# Patient Record
Sex: Male | Born: 1965 | Race: White | Hispanic: No | Marital: Married | Smoking: Former smoker
Health system: Southern US, Community
[De-identification: ages and names within clinical notes are randomized; demographics above are authoritative.]

## PROBLEM LIST (undated history)

## (undated) DIAGNOSIS — K559 Vascular disorder of intestine, unspecified: Secondary | ICD-10-CM

## (undated) DIAGNOSIS — J189 Pneumonia, unspecified organism: Secondary | ICD-10-CM

## (undated) DIAGNOSIS — J45909 Unspecified asthma, uncomplicated: Secondary | ICD-10-CM

## (undated) DIAGNOSIS — T8859XA Other complications of anesthesia, initial encounter: Secondary | ICD-10-CM

## (undated) DIAGNOSIS — R6521 Severe sepsis with septic shock: Secondary | ICD-10-CM

## (undated) DIAGNOSIS — A419 Sepsis, unspecified organism: Secondary | ICD-10-CM

## (undated) DIAGNOSIS — T4145XA Adverse effect of unspecified anesthetic, initial encounter: Secondary | ICD-10-CM

## (undated) DIAGNOSIS — Z9289 Personal history of other medical treatment: Secondary | ICD-10-CM

## (undated) DIAGNOSIS — R519 Headache, unspecified: Secondary | ICD-10-CM

## (undated) DIAGNOSIS — R51 Headache: Secondary | ICD-10-CM

## (undated) DIAGNOSIS — M21372 Foot drop, left foot: Secondary | ICD-10-CM

## (undated) DIAGNOSIS — F419 Anxiety disorder, unspecified: Secondary | ICD-10-CM

## (undated) DIAGNOSIS — K631 Perforation of intestine (nontraumatic): Secondary | ICD-10-CM

## (undated) HISTORY — PX: BACK SURGERY: SHX140

---

## 2002-10-24 ENCOUNTER — Encounter: Payer: Self-pay | Admitting: Emergency Medicine

## 2002-10-24 ENCOUNTER — Emergency Department (HOSPITAL_COMMUNITY): Admission: EM | Admit: 2002-10-24 | Discharge: 2002-10-24 | Payer: Self-pay | Admitting: Emergency Medicine

## 2002-10-29 ENCOUNTER — Emergency Department (HOSPITAL_COMMUNITY): Admission: EM | Admit: 2002-10-29 | Discharge: 2002-10-29 | Payer: Self-pay | Admitting: Plastic Surgery

## 2004-08-16 ENCOUNTER — Encounter: Admission: RE | Admit: 2004-08-16 | Discharge: 2004-08-16 | Payer: Self-pay | Admitting: Occupational Medicine

## 2006-10-11 ENCOUNTER — Emergency Department (HOSPITAL_COMMUNITY): Admission: EM | Admit: 2006-10-11 | Discharge: 2006-10-11 | Payer: Self-pay | Admitting: Emergency Medicine

## 2006-12-04 ENCOUNTER — Inpatient Hospital Stay (HOSPITAL_COMMUNITY): Admission: EM | Admit: 2006-12-04 | Discharge: 2006-12-06 | Payer: Self-pay | Admitting: Emergency Medicine

## 2007-03-24 ENCOUNTER — Ambulatory Visit (HOSPITAL_COMMUNITY): Admission: RE | Admit: 2007-03-24 | Discharge: 2007-03-24 | Payer: Self-pay | Admitting: Neurological Surgery

## 2007-07-31 ENCOUNTER — Inpatient Hospital Stay (HOSPITAL_COMMUNITY): Admission: RE | Admit: 2007-07-31 | Discharge: 2007-08-05 | Payer: Self-pay | Admitting: Neurological Surgery

## 2007-08-05 ENCOUNTER — Ambulatory Visit: Payer: Self-pay | Admitting: Thoracic Surgery

## 2007-08-12 ENCOUNTER — Ambulatory Visit: Payer: Self-pay | Admitting: Thoracic Surgery

## 2007-08-12 ENCOUNTER — Encounter: Admission: RE | Admit: 2007-08-12 | Discharge: 2007-08-12 | Payer: Self-pay | Admitting: Thoracic Surgery

## 2007-09-09 ENCOUNTER — Ambulatory Visit: Payer: Self-pay | Admitting: Thoracic Surgery

## 2007-09-09 ENCOUNTER — Encounter: Admission: RE | Admit: 2007-09-09 | Discharge: 2007-09-09 | Payer: Self-pay | Admitting: Thoracic Surgery

## 2008-02-25 ENCOUNTER — Ambulatory Visit (HOSPITAL_COMMUNITY): Admission: RE | Admit: 2008-02-25 | Discharge: 2008-02-25 | Payer: Self-pay | Admitting: Neurological Surgery

## 2008-04-27 ENCOUNTER — Inpatient Hospital Stay (HOSPITAL_COMMUNITY): Admission: RE | Admit: 2008-04-27 | Discharge: 2008-04-29 | Payer: Self-pay | Admitting: Neurological Surgery

## 2008-04-27 ENCOUNTER — Ambulatory Visit: Payer: Self-pay | Admitting: Vascular Surgery

## 2010-07-31 LAB — CBC
HCT: 32.8 % — ABNORMAL LOW (ref 39.0–52.0)
Hemoglobin: 10.5 g/dL — ABNORMAL LOW (ref 13.0–17.0)
Hemoglobin: 11.3 g/dL — ABNORMAL LOW (ref 13.0–17.0)
MCHC: 33.9 g/dL (ref 30.0–36.0)
Platelets: 212 10*3/uL (ref 150–400)
RBC: 3.52 MIL/uL — ABNORMAL LOW (ref 4.22–5.81)
RBC: 3.76 MIL/uL — ABNORMAL LOW (ref 4.22–5.81)
RDW: 13.4 % (ref 11.5–15.5)
RDW: 14 % (ref 11.5–15.5)

## 2010-07-31 LAB — TYPE AND SCREEN
ABO/RH(D): A NEG
Antibody Screen: NEGATIVE

## 2010-07-31 LAB — BASIC METABOLIC PANEL
CO2: 27 mEq/L (ref 19–32)
Calcium: 8.4 mg/dL (ref 8.4–10.5)
GFR calc Af Amer: >60 mL/min (ref >60)
GFR calc non Af Amer: >60 mL/min (ref >60)
GFR calc non Af Amer: >60 mL/min (ref >60)
Glucose, Bld: 109 mg/dL — ABNORMAL HIGH (ref 70–99)
Potassium: 3.2 mEq/L — ABNORMAL LOW (ref 3.5–5.1)
Sodium: 136 mEq/L (ref 135–145)
Sodium: 140 mEq/L (ref 135–145)

## 2010-08-29 NOTE — Op Note (Signed)
NAMEPEIGHTON, EDGIN NO.:  [id number]   MEDICAL RECORD NO.:  [id number]          PATIENT TYPE:  INP   LOCATION:  3101                         FACILITY:  [facility]   PHYSICIAN:  Ines Bloomer, M.D. DATE OF BIRTH:  [birth year]   DATE OF PROCEDURE:  DATE OF DISCHARGE:                               OPERATIVE REPORT   PREOPERATIVE DIAGNOSIS:  T8-T9 herniated disk with myelopathy.   POSTOPERATIVE DIAGNOSIS:  T8-T9 herniated disk with myelopathy.   OPERATION PERFORMED:  Left thoracotomy for exposure of T8-T9.   SURGEON:  Ines Bloomer, MD   FIRST ASSISTANT:  Ollen Gross, MD   ANESTHESIA:  Percutaneous.   DESCRIPTION OF PROCEDURE:  After percutaneous insertion of all  monitoring lines,  the patient was turned to left lateral thoracotomy  position.  Dual lumen tube was inserted, and he was prepped and draped  in the usual sterile manner.  A posterolateral thoracotomy was made.  The lattisamus was divided with electrocautery .  The seventh  intercostal space was entered at the portion of the eighth rib and was  taken subperiosteally at the angle.  Two K-wires were placed in the  interspace, and the pleura was incised along with the T8, T9, T10, and  T11 disk.  The vetebral bodies were taken with the intervertebral  vessels over T9 and T8.  Dr. Danielle Dess then performed discectomies and  fusion.  After this had been done, 2 chest tubes were brought in through  separate stab wounds using #28 chest tubes and sutured in place with 0-  silk.  The chest was closed with 5 pericostals drilling through the  ninth rib and passed around the eighth rib, #1 Vicryl in the muscle  layer, 2-0 Vicryl in the subcutaneous tissue, and Dermabond for the  skin.  Marcaine block was then used in the usual fashion.  A single On-Q  was inserted in the usual fashion.  The patient returned to the recovery  room in stable condition.      Ines Bloomer, M.D.  Electronically  Signed     DPB/MEDQ  D:  07/31/2007  T:  08/01/2007  Job:  [id number]

## 2010-08-29 NOTE — Op Note (Signed)
NAME:  [name], [name]                 ACCOUNT NO.:  [id number]   MEDICAL RECORD NO.:  [id number]          PATIENT TYPE:  INP   LOCATION:  3101                         FACILITY:  [facility]   PHYSICIAN:  Stefani Dama, M.D.  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   PROCEDURE:  Transthoracic diskectomy T8-T9 and T9-T10 with decompression  of spinal cord and nerve roots using operating microscope,  microdissection technique, reconstruction with PEEK spacers, local  autograft and allograft, and interbody fusion T8-T9 and T9-T10, and  stabilization with Tamarack plate [id number].   SURGEON:  Stefani Dama, MD   FIRST ASSISTANT:  Hilda Lias, MD   ANESTHESIA:  General endotracheal.   APPROACH AND CLOSURE:  By Cameron Proud, MD   INDICATIONS:  [name] is a 45 year old individual who has had a  large disk herniation at the level of T9-T10.  He underwent an emergent  diskectomy back in August of last year.  He did improve substantially;  however, the patient had considerable complaints of continued pain with  dysfunction in his lower extremities and featuring numbness, tingling,  and dysesthesias.  A followup post-myelogram CAT scan demonstrated  presence of large bone spur at the level of T9-T10 on the left side.  On  the right side, he had evidence of a disk herniation with spondylitic  changes at the level of T8-T9.  After careful consideration of his  options, he was advised regarding a transthoracic decompression, and he  is now taken to the operating room for this procedure.   PROCEDURE:  The patient was brought to the operating room, supine on  the  stretcher.  After smooth induction of general endotracheal anesthesia,  he was placed in the right lateral decubitus position.  Dr. Edwyna Shell  performed a T8 thoracotomy and exposed the area from T8 and T10, and  several localizing radiographs were identified, but performed a  confirmed T8-T9 and T9-T10 disk spaces.  Then, I started the procedure  by opening the disk space at the T8-T9 level in the lateral aspect and  then performed a diskectomy using combination of curettes and rongeurs  to evacuate a significant quantity of severely degenerated disk  material.  Care was taken to maintain the integrity of the disk space,  but to fully rongeur away the cartilaginous endplate to expose good bony  endplate.  Then, the dissection was carried towards the disk space, and  ultimately the region of posterior longitudinal ligament was  encountered, and the ligament was opened easily on the patient's left  side.  Then, then towards the right side, there was noted to be  redundant disk material and thickened ligament, which was consistent  with the area of the disk herniation.  This was carefully dissected with  the use of  the operating microscope and microdissection technique, care  being taken to protect the integrity of the dura and also to prevent any  spinal canal compromise.  In the end, the diskectomy was performed  completely, and the ventral aspect of the dura was well decompressed.  Attention was then turned to T9-T10, where similar diskectomy was  performed here; however, the bulk of the compression was from  osteophytic overgrowth on the left side of the canal, particularly from  the superior area of the endplate of the vertebral body at T10.  This  area was drilled down carefully with the use of loupe magnification, and  care was taken to identify the dura and then decompress the lateral  margin on the left side.  The right side was noted to be fully and well  decompressed and did  not require any surgical intervention.  Once the  area on the left side of T9-T10 was decompressed, attention was turned  to the disk space, where similar full diskectomy was performed to allow  placement of an interbody spacer.  The interspaces were sized, and it  was felt that a 6-mm large round PEEK spacer of the [employer] variety  would fit nicely into the interspace.  These were then prepared with a  combination of the patient's own bone graft, which was harvested from  portion of the T8 rib, which was partially resected to allow for better  exposure.  This was mixed with the sample of Vitas bone matrix sponge  and also a small sample of infused bone morphogenic protein.  The strip  of bone morphogenic protein infuse was then placed into the interspace  on the very ventral aspect, then the spacer was placed, and then the  remainder of bone and Vitas was placed to fill up the ventral aspect of  the disk space.  Care was taken to make sure that there was no material  that allowed itself to extrude posteriorly near the dura.  This was  first done at T9-T10 and then at T8-T9.  Once the interbody spacers were  placed, attention was turned to the placement of a Tamarack plate.  With  the patient's lateral angle being appreciated appropriately, an awl was  used in appropriate direction in the midportion of the vertebral body of  T8 and midportion of the vertebral body of T9 to place pilot holes.  Pilot holes were then drilled down to 40-mm size in T10 and 35-mm size  in T8, and the holes were then tapped with a 6.5-mm tap.  A 35-mm screw  was placed in T8, 40-mm screw was placed in T10.  The interscrew  distance was measured at 58 mm, and then a 15.8-mm Tamarack plate was  selected and placed over the screw holes.  Then, the ventral screw were  placed by tapping with an awl and selecting a 30-mm screw, which was  pretapped into the space on the ventral aspect of the plate.  Once these  were  secured, compression was applied to the construct, and this locked  the ventral screws in position.  The posterior bolts were then tightened  into their final torque position.  A localizing radiograph was then  obtained, which identified good position of the hardware.  At this  point, the wound was checked for hemostasis, and the procedure was  turned over to Dr. Edwyna Shell for final closure of the thoracotomy.  Blood  loss for this portion of procedure was about 450 mL.  The patient  tolerated the procedure well and 150 mL of Cell Saver blood was returned  to the patient.      Stefani Dama, M.D.  Electronically Signed     HJE/MEDQ  D:  07/31/2007  T:  08/01/2007  Job:  [id number]

## 2010-08-29 NOTE — H&P (Signed)
[name], [name] NO.:  [id number]   MEDICAL RECORD NO.:  [id number]          PATIENT TYPE:  INP   LOCATION:  5032                         FACILITY:  [facility]   PHYSICIAN:  Nelda Severe, MD      DATE OF BIRTH:  28-Jan-1966   DATE OF ADMISSION:  12/04/2006  DATE OF DISCHARGE:                              HISTORY & PHYSICAL   CHIEF COMPLAINT:  Left lower extremity weakness increasing.   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  He had a  twisting injury of his back while trying to control the grinding  machine, which was equipped with a bent handle.  Subsequent to that, he  developed intercostal pain on the left side, and that resolved, and he  developed back pain associated with some left lower extremity weakness.  He ultimately had an MRI scan, which showed a T9-10 disk herniation on  the left side, which is rather large.   He was recently examined by me for the first time, 5 days ago.  At that  time, he was observed to have a very broad-based gait, to be able to  perform rapid alternating tapping of the foot considerably less well on  the left than on the right side, and he has some mild nonfocal weakness  on the left side.  He did not have a spastic gait, he did not have  upgoing Babinski response.  He did not have hyperreflexia or clonus.  At  that time, I placed him on an oral dose of Decadron to see if that would  help his symptomatology, and I reviewed him 3 days later, 2 days ago.  At that time, he indicated that in terms of how he felt, the Decadron  had not helped him.  His examination remained the same.   At that time, I initiated the process of requesting authorization from  the workers compensation carrier for office consultation with Dr. Barnett Abu, with whom I spoke about the patient at some length yesterday.  At the time he was seen 2 days ago, I told him  that he might as well  start to wean himself off his Decadron as it did not seem to be helping.   Today, he called and reported that her symptoms were worsening off the  Decadron.  For that reason, I had him come to the emergency room for  evaluation.   At the present time, because of worsening symptoms and some decrease in  his ambulatory ability, I thought that we should admit him emergently,  especially in view of the fact that his spinal cord is compressed to  some extent by this large disk herniation.   REVIEW OF SYSTEMS:  He is quite healthy.  He has never had any serious  illnesses.   His only history of past surgery is bilateral inguinal herniorrhaphies  as an infant.   SOCIAL HISTORY:  He is married.  He has 2 children, boys, 18 and 16, by  a previous marriage.  These boys are primarily in the custody of his  parents, but he does have them every second weekend.  He does not have  any children with his present wife.  He is a smoker.  He has been a  heavy drinker in the past, at some point, but has had no alcohol in the  past 6 months.   PHYSICAL EXAMINATION:  GENERAL:  Examination today reveals a man who is  quite uncomfortable.  He prefers to sit rather than to lie or stand.  HEENT:  Extraocular motion is normal.  NECK:  Supple.  No signs of nuchal rigidity.  CHEST:  Auscultation reveals a few coarse rhonchi.  CARDIOVASCULAR:  Normal heart sounds, regular rhythm.  No peripheral  edema.  Full pedal pulses.  ABDOMEN:  Soft, nontender.  MUSCULOSKELETAL:  Normal range of motion of shoulders, elbows, wrists,  hands, hips, knees, ankles.  NEUROLOGIC:  Deep tendon reflexes- biceps, triceps jerk and Achilles  jerk all 0/4.  Knee jerks 1+/4 bilaterally.  Proprioception decreased in  the left foot.  Strength testing on manual resistance reveals very mild  generalized weakness in both upper and lower extremities.  His ability  to rapidly tap his left foot is significantly less on  the left than on  the right.  There are no sciatic nerve tension signs.   MRI scan, of which he has a copy on a disk in his possession, shows a  large T9-10 left-sided disk herniation.  Radiographs done in my office  of the lumbar spine show multiple level degenerative change.  The  thoracic spine radiographs, done elsewhere, are unremarkable.   DIAGNOSIS/ASSESSMENT:  T9-10 disk herniation left side, it is difficult  to understand his subjective sense of altered sensation in his left  upper extremity and face as well as in his left lower extremity.  This  is not a complaint that he seemed to voice on the previous two occasions  I saw him prior to evaluating him today in the emergency room.  His gait  today is worse than it was when previously seen.  It is very broad-  based.  It is not spastic.   PLAN:  At this time, I am admitting him, putting him on intravenous  Decadron, and seeking consultation with Dr. Danielle Dess.  I suspect he will  likely come to surgery for his thoracic disk.  I will leave up to Dr.  Danielle Dess as to whether he should have further investigation with regard to  the peculiar sensations he is feeling in his upper extremity and left  side of his face.      Nelda Severe, MD  Electronically Signed     MT/MEDQ  D:  12/04/2006  T:  12/04/2006  Job:  [id number]   cc:   Stefani Dama, M.D.

## 2010-08-29 NOTE — Op Note (Signed)
NAMELAURANCE, HEIDE NO.:  [id number]   MEDICAL RECORD NO.:  [id number]          PATIENT TYPE:  INP   LOCATION:  5032                         FACILITY:  [facility]   PHYSICIAN:  Stefani Dama, M.D.  DATE OF BIRTH:  October 20, 1965   DATE OF PROCEDURE:  12/05/2006  DATE OF DISCHARGE:  12/06/2006                               OPERATIVE REPORT   PREOPERATIVE DIAGNOSIS:  Herniated nucleus pulposus T9-T10, left, with  left lumbar radiculopathy.   POSTOPERATIVE DIAGNOSIS:  Herniated nucleus pulposus T9-T10, left, with  left lumbar radiculopathy.   PROCEDURE:  T9-T10 laminectomy, decompression of spinal cord, with  discectomy using operating microscope and microdissection technique.   SURGEON:  Stefani Dama, M.D.   ASSISTANT:  Nelda Severe, M.D.   ANESTHESIA:  General endotracheal.   INDICATIONS:  [name] is a 45 year old individual who has had  significant back and bilateral lower extremity pain with left sided  weakness and numbness and complaints of significant difficulty with  balance and ability to stand.  He was found to have a large extruded  fragment of disc at the T9-T10 level.  He has had progressive worsening  symptoms.  He has been advised regarding surgical decompression.   DESCRIPTION OF PROCEDURE:  The patient was brought to the operating room  supine on the stretcher. After the smooth induction of general  endotracheal anesthesia, he was turned prone.  His back was prepped with  alcohol and DuraPrep and draped in a sterile fashion.  Prior to this,  localizing radiographs were taken to identify the T9-T10 level.  The  incision was centered over this area in the midline and carried down to  the thoracodorsal fascia which was opened on the left side of midline.  The paraspinous musculature was dissected in a subperiosteal fashion.  A  self-retaining retractor was placed in the wound.  The T9-T10 interspace  was then identified and a laminotomy was  performed out to and including  the entirety of the facet.  The superior portion of the pedicle of T10  was also exposed and partially dissected.  The lateral aspect of the  dura was identified and using a series of Kerrison punches, the bone was  then removed to expose the dural tube.  On the lateral aspect of the  dura, there was noted be a significant indentation. This was found to be  from underlying disc material.  Then, by starting a dissection lateral  to this point, an opening was made into the disc space and an opening  was also made under the posterior longitudinal ligament on the left  side.  This yielded some fragments of disc which were able to be removed  with microdissection technique using various nerve hooks, Epstein  curets, and surgical dynamic dissectors. This process continued for the  next two hours while the disc was removed in a piecemeal fashion.  The  border of the lateral aspect of the dura was not clearly identified and  a stay suture was placed into what was ultimately identified as the  posterior longitudinal ligament.  This  allowed tenting up of the  bilateral aspect of the dura so that further dissection could be  performed. To angle the scope more medially, we removed the superior and  lateral portions of the pedicle and this allowed for a little better  visualization under the edge of the dura. Ultimately, it was found that  the dural edge could be identified and this was dissected free and disc  material was then removed along with the posterior longitudinal ligament  to allow for good decompression towards the central portions of the  common dural tube. Once the dissection was felt to be free and clear,  hemostasis from the epidural veins was obtained with some pledgets of  Gelfoam soaked in thrombin.  These were later removed.  The wound was  irrigated copiously.  Hemostasis in the soft tissues was obtained in  this fashion and then, the thoracodorsal  fascia was closed with #1  Vicryl in an interrupted fashion, 2-0 Vicryl was used in the  subcutaneous tissues, 3-0 Vicryl subcuticularly.  10 mL of 0.25%  Sensorcaine was injected into the paraspinous musculature on the left  side and thoracic spine. The patient tolerated the procedure well and  was returned to the recovery room in stable condition.      Stefani Dama, M.D.  Electronically Signed     HJE/MEDQ  D:  12/05/2006  T:  12/06/2006  Job:  [id number]

## 2010-08-29 NOTE — Discharge Summary (Signed)
NAMEGAYLE, [name] NO.:  [id number]   MEDICAL RECORD NO.:  [id number]          PATIENT TYPE:  INP   LOCATION:  5032                         FACILITY:  [facility]   PHYSICIAN:  Nelda Severe, MD      DATE OF BIRTH:  Oct 04, 1965   DATE OF ADMISSION:  12/04/2006  DATE OF DISCHARGE:  12/06/2006                               DISCHARGE SUMMARY   ADMITTING DIAGNOSIS:  T9-10 herniated disc.   He was placed on Decadron 10 mg IV q.6 and had a consultation by Dr.  Barnett Abu.  The decision was made to keep him n.p.o. and planned  surgical excision of herniated disc fragment on the left on December 05, 2006.   PROCEDURE:  T9-10 laminectomy, decompression of spinal cord by Dr. Barnett Abu, assistant Dr. Nelda Severe.   Postoperatively, patient was alert, moving both lower extremities and  returned back to 5032, full recovery.  Postoperatively, he was placed on  a regular diet as tolerated.  Given morphine, full dose PCA, Zofran for  nausea, Valium 5 mg for spasm and continued on Norco 10/325.  Today, on  physical exam, December 06, 2006, patient is actively moving about the  room independent.  He can tap his feet, alternating with good  coordination distally.  Neurovascularly, motor intact.  Calves soft and  nontender.  Dressing was removed.  Incision site is clean and dry.  No  active drainage.  A clean and dry drainage was applied.  There was  minimal postoperative changes on the dressing itself.   FINAL DIAGNOSIS:  Status post T9-10 laminectomy, decompression of spinal  cord.   PLAN:  Clean and dry dressing should be applied daily.  He may shower  and he is going to follow up with Dr. Barnett Abu in 4 weeks.  Lianne Cure, PA-C from [employer] is writing pain medicine Norco  10/325 one to two q.4 p.r.n. for pain control, count of 20 with 1  refill.  He can continue on his Naproxen as previously.  He may call our  office at any time; otherwise, follow up  with Dr. Barnett Abu.  No  lifting, stooping, bending or squatting is advised.  Walk for exercise.  No equipment was needed on discharge.      Lianne Cure, P.A.      Nelda Severe, MD  Electronically Signed    MC/MEDQ  D:  12/06/2006  T:  12/06/2006  Job:  [id number]

## 2010-08-29 NOTE — Op Note (Signed)
[name], RISE NO.:  [id number]   MEDICAL RECORD NO.:  [id number]          PATIENT TYPE:  INP   LOCATION:  3310                         FACILITY:  [facility]   PHYSICIAN:  Stefani Dama, M.D.  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   PROCEDURES:  1. Anterior lumbar decompression L3-4 and L4-5.  2. Arthrodesis with PEEK spacer and anterior titanium plate fixation      L3-4 and L4-5.   SURGEON:  Stefani Dama, MD   ANESTHESIA:  General endotracheal.   INDICATIONS:  [name] is a 45 year old individual who has had  significant problems with back pain, lower extremity pain, having had a  thoracic disk herniation with myelopathy, and now having had advanced  spondylitic stenosis at L3-4 and L4-5 demonstrating significant  peripheral lower extremity weakness.  He has been advised regarding  surgical decompression at L3-4 and L4-5 procedure.   The patient was brought to the operating room, placed on the table in  supine position.  After a smooth induction of general endotracheal  anesthesia, the anterior abdominal wall was shaved, prepped with alcohol  and DuraPrep and draped in a sterile fashion.  A vertical incision was  made on the left side and the approach was performed by Dr. Gretta Began.  Dr. Arbie Cookey exposed the anterior borders of L3-4 and L4-5.  At that point,  I started the procedure.  The retractors were exposing the L4-5 disk  space, which was previously localized positively with a radiograph.  I  incised the anterior longitudinal ligament and removed it along with a  good portion of significantly degenerated ventral disk.  There was  substantial  subligamentous herniation of the ventral aspect of the disk  and this caused significant osteophyte formation.  The osteophytes were  ground down with a small rongeur.  The disk space was evacuated with a  substantial amount of significantly degenerated and desiccated disk  material.  As the region of the posterior longitudinal ligament was  reached, the disk was removed from this area and the ligament was  exposed.  There was sub-ligamentously herniated disk material in this  area that was retrieved with a nerve hook.  Dissection was carried out  to expose and then undercut the ligament itself.  There was noted to be  portions of calcified ligament, particularly on the right side that  caused a significant lateral recess stenosis.  This was carefully  dissected down and removed.  Epidural venous bleeding was cauterized  with a bipolar cautery and then some small pledgets of Gelfoam were left  to pack this away.  Further dissection out to the left side yielded some  calcified ligamentous material also.  Once this was resected, a good  decompression was felt to be had.  A 13.5 mm tall,  8-degree lordosis, 30  x 38 mm spacer was then placed as a trial and this was checked  radiographically.  An 8-degrees of lordosis seemed to suit the area  quite well, and a final spacer of this dimension was placed into the  interspace after being filled with some Infuse and Vitoss and then  placed into the squid placement device and the squid being tamped into  position at the L4-L5 level.  Radiographic confirmation was obtained for  the device and was felt to be seated nicely.  Four 20 mm screws were  locked into ventral titanium plates and secured in position.  Attention  was then turned to L3-4 where similar procedure was carried out.  Here,  ventral osteophytes were smaller; however, the posterior osteophytes  were noted to be about the same size and cause considerable compression  of the common dural  tube.  The ligament in this area was not opened, as  it was felt to be soft and pliable.  Once the area was decompressed  fully, the endplates were shaved using a high-speed bur and a 5-mm round  barrel bit.  Then, the interspace was sized for an appropriate size  spacer and it was felt that a 13.5-mm tall, 8-degree lordosis, 30 x 38  mm spacer would fit best into this interval also.  This was then filled  with a same combination of Vitoss and Infuse and then tamped into  position using the squid applicator.  Four 20 mm screws were again used  to secure this and ventral titanium plate in the same fashion as they  did at L4-5.  Final radiographs were then obtained with all the  retractors removed and what was noted that good positioning was secured,  hemostasis in the soft tissues was checked.  Then, I closed the ventral  abdominal wound by closing the anterior rectus fascia with #1 Vicryl in  a running fashion, 2-0 Vicryl was used in a subcutaneous tissues, 3-0  Vicryl subcuticularly, and a final subcuticular layer of 3-0 Vicryl was  used to close the skin.  The patient tolerated the procedure well.  Blood loss was estimated 250 mL.  He was returned to the recovery room  in stable condition.      Stefani Dama, M.D.  Electronically Signed     HJE/MEDQ  D:  04/27/2008  T:  04/28/2008  Job:  [id number]

## 2010-08-29 NOTE — Assessment & Plan Note (Signed)
OFFICE VISIT   Roger, Schwartz  DOB:  1966/03/23                                        August 12, 2007  CHART #:  [id number]   HISTORY OF PRESENT ILLNESS:  [name] returns on August 12, 2007 for  office visit followup following his left thoracotomy for exposure of T8,  T9 for repair of herniated disks secondary to myelopathy.  He reports  that overall he is making steady progress, but does have some difficulty  with pain.  He continues to use the pain medicine postoperatively with  relief.  His breathing is fairly comfortable.  He is slowly advancing  his activity level.  He has had no significant difficulties with fever  or other constitutional symptoms.  A chest x-ray done August 12, 2007  does reveal some left lower lobe scarring and reaction in the lungs.  This is consistent with his postoperative state, and there is no  evidence of pneumonia or congestive heart failure findings.   PHYSICAL EXAMINATION:  Vital Signs:  Blood pressure 137/88, pulse 82,  respirations 18.  Oxygen saturation 97% on room air.  Incision is  healing well without evidence of infection as are the chest tube sites.  Pulmonary:  Examination reveals diminished breath sounds in the left  base, otherwise clear.  Cardiac examination:  Regular rate and rhythm.  Normal S1, S2.   ASSESSMENT:  [name] is making adequate progress following his surgery.  He is encouraged to continue his deep breathing and use of his incentive  spirometer.  He is instructed to increase his activities slowly.  He  still continues with lifting restrictions.   PLAN:  To see him back in the office in one week with a repeat chest x-  ray to see how he resolves that scarring and atelectasis.   Rowe Clack, P.A.-C.   Sherryll Burger  D:  08/12/2007  T:  08/12/2007  Job:  [id number]   cc:   Stefani Dama, M.D.

## 2010-08-29 NOTE — Consult Note (Signed)
NAMEJERRAD, Schwartz NO.:  [id number]   MEDICAL RECORD NO.:  [id number]          PATIENT TYPE:  INP   LOCATION:  5032                         FACILITY:  [facility]   PHYSICIAN:  Stefani Dama, M.D.  DATE OF BIRTH:  1965/09/29   DATE OF CONSULTATION:  12/04/2006  DATE OF DISCHARGE:                                 CONSULTATION   REASON FOR REQUEST:  Thoracic disk herniation.   HISTORY OF PRESENT ILLNESS:  [name] is a 45 year old right  handed white male who had a work related incident some weeks ago, which  resulted in significant back pain.  A workup included an MRI of the  thoracic spine, which demonstrates a large extruded fragment of disk at  T9, T10.  Dr. Alveda Reasons had noted on his exam that the patient was  myelopathic with an exceedingly wide based gait and unsteadiness on his  feet.  The patient complained that he did not have good proprioception,  could not feel where he was placing his feet, however, in the last  couple of days, he has developed some left hemibody numbness and he  particularly contacted Dr. Alveda Reasons today noting that the sensation of  numbness was considerable worse.  He was brought into the hospital and  started on some IV fluids with some IV Decadron.  Dr. Alveda Reasons had arranged  for outpatient consultation prior to the patient calling about his  worsening.   PAST MEDICAL HISTORY:  Reveals that his general health has been good.  He has had inguinal hernia repair in the past but he has no other  significant illnesses noted.   PHYSICAL EXAMINATION:  GENERAL:  His physical exam at the current time  reveals that he is an alert, oriented individual.  His speech and  thought processes are normal and speech production is normal.  HEENT:  His head is normocephalic, atraumatic.  NECK:  The neck reveals no masses and no bruits are heard.  Range of  motion of his neck is normal.  He can extend and flex without  difficulty, denies any pain.   Spurling's maneuver is negative.  EXTREMITIES:  Motor strength reveals no evidence of a drift in the upper  extremities.  There is normal strength in the upper and lower  extremities.  He has increased tone with some spasticity in that right  lower extremity particularly when noting his gait, which is wide based  and has a scissor-like quality to it.  He has normal tone and bulk in  the upper extremities.  His reflexes are 2+ in the biceps, 1+ in the  triceps, 2+ in the patella, 3+ in the right Achilles, though no clonus  is noted, 2+ in the left Achilles.  Babinski reflex on the right side is  equivocal, downgoing on the left side.  LUNGS:  His general physical exam otherwise reveals that his lungs are  clear to auscultation.  ABDOMEN:  The abdomen is soft, bowel sounds are positive, no masses are  noted.  BACK:  The back has no lesions that are evident.  IMPRESSION:  The patient has evidence of a large extruded fragment of  disk at T9, T10.  He does have remarkably wide based gait and  unsteadiness on his feet.   At this point, I believe that he will require surgical decompression and  this should be done via laminectomy costotransversectomy approach at the  T9, T10 level.  We will plan on scheduling this for the next day or so.  I will  order an MRI of the cervical spine as the heavy biting numbness is an  unusual symptom, however, it would be well wise to assure that he has no  ongoing compression of his cervical spinal cord given the sizable disk  herniation that he has in the thoracic spine.      Stefani Dama, M.D.  Electronically Signed     HJE/MEDQ  D:  12/04/2006  T:  12/05/2006  Job:  [id number]

## 2010-08-29 NOTE — Assessment & Plan Note (Signed)
OFFICE VISIT   JAMARIO, COLINA  DOB:  [birth year]                                        Sep 09, 2007  CHART #:  [id number]   HISTORY OF PRESENTING ILLNESS:  Mr. Leitzel is status post a left  thoracotomy for exposure of T8 and T9 by Dr. Edwyna Shell on July 31, 2007.  He was previously seen in the office on August 12, 2007 and at that time  his only complaint was that of some incisional pain, which was relieved  with pain medicine.  He was encouraged to continue using an inspirometer  and increase his activity level slowly.  He currently states his only  complaint is decreased sensation inferior to the incision line but that  he has very little pain at this time.  He has no complaints of shortness  of breath, fever, chills or night sweats.  His chest x-ray that was done  today revealed improved aeration with decrease in left basilar  atelectasis.  No pleural effusion was seen.   PHYSICAL EXAMINATION:  GENERAL:  This is a pleasant 45 year old  Caucasian male in no acute distress who is alert, oriented, cooperative.  His vital signs are as follows:  BP is 124/72, pulse rate 77,  respirations 18, O2 sat 97% on room air.  CARDIOVASCULAR:  Regular rate and rhythm.  PULMONARY EXAM:  Clear to auscultation bilaterally.  No rales, wheezes  or rhonchi.  Left wound is clean, dry, continuing to heal.  No signs of  infection.   IMPRESSION AND PLAN:  Mr. Xia is doing very well overall.  He is again  encouraged to increase his activities slowly as tolerated and he still  does have lifting restrictions.  He has an appointment to see Dr. Danielle Dess  on September 19, 2007 and he will see Dr. Edwyna Shell on a p.r.n. basis.   Doree Fudge, PA   DZ/MEDQ  D:  09/09/2007  T:  09/09/2007  Job:  [id number]

## 2010-08-29 NOTE — Op Note (Signed)
NAME:  [name], [name] NO.:  [id number]   MEDICAL RECORD NO.:  [id number]          PATIENT TYPE:  INP   LOCATION:  3310                         FACILITY:  [facility]   PHYSICIAN:  Larina Earthly, M.D.    DATE OF BIRTH:  December 05, 1965   DATE OF PROCEDURE:  DATE OF DISCHARGE:                               OPERATIVE REPORT   PREOPERATIVE DIAGNOSIS:  Degenerative disk disease at L3-4 and L4-5.   POSTOPERATIVE DIAGNOSIS:  Degenerative disk disease at L3-4 and L4-5.   PROCEDURE:  Anterior exposure for ALIF which will be dictated as a  separate note by Dr. Barnett Abu.   SURGEON:  Dr. Larina Earthly, M.D.   ASSISTANT:  Stefani Dama, M.D.   ANESTHESIA:  General endotracheal.   COMPLICATIONS:  None.   DISPOSITION:  To recovery room, stable.   INDICATIONS:  The patient is a 45 year old gentleman with progressive  degenerative disk disease.  He had been seen in consultation with Dr.  Barnett Abu who recommended anterior exposure for disk fusion of L3-4  and L4-5 and I had been asked to provide exposure.  I discussed this  with Mr. Bedoya preoperatively explaining my role for visualization and  exposure and also the potential with injury to the arterial and venous  structures, ureter.  Also, I discussed the potential for erectile  dysfunction and retrograde ejaculation.  The patient understood and  wished to proceed with surgery.   PROCEDURE IN DETAIL:  The patient was taken to the operating room,  placed in supine position.  The area of the abdomen was prepped and  draped in the usual sterile fashion using  C-arm, the level of the 3-4  and 4-5 disk space was marked on the skin.  A  paramedian incision was  made just to the left of the umbilicus at the level of the L3-4 and 4-5  disk.  The incision was continued deep down to the fascia and the fat  was mobilized off the fascia proximally and distally and medially and  laterally.  The fascia was opened with electrocautery  just to the left  of the linea alba.  The rectus muscle was exposed.  The fascia was  opened longitudinally for the length of the skin incision. The rectus  muscle was mobilized to the left.  The posterior rectus sheath was  opened laterally and at the level of the semilunar line.  The  retroperitoneal space was entered and the peritoneal space was not  entered during the procedure.  The abdominal contents were mobilized to  the right.  The iliac vessels and ureter was identified and were  mobilized.  The iliolumbar vein was identified and was ligated with 3-0  silk ties and divided.  This gave ability to mobilize the iliac artery  and vein further to the right to give anterior exposure to the L4-5 disk  space.  Dissection was then continued cranially up towards the 3-4 disk.  Intercostal vein and the artery were controlled with Ligaclips  to give better exposure to the disk space. The Brau retractor  was  brought onto field and was attached to the bed and the right and left  blades were positioned for the L4-5 diskectomy.  The remainder of the  procedure will be dictated by Dr. Barnett Abu.      Larina Earthly, M.D.  Electronically Signed     TFE/MEDQ  D:  04/27/2008  T:  04/28/2008  Job:  [id number]

## 2010-08-29 NOTE — Discharge Summary (Signed)
NAMEJAARON, OLESON NO.:  [id number]   MEDICAL RECORD NO.:  [id number]          PATIENT TYPE:  INP   LOCATION:  3007                         FACILITY:  [facility]   PHYSICIAN:  Stefani Dama, M.D.  DATE OF BIRTH:  11-02-1965   DATE OF ADMISSION:  07/31/2007  DATE OF DISCHARGE:  08/05/2007                               DISCHARGE SUMMARY   ADMITTING DIAGNOSES:  Thoracic herniated nucleus pulposus T8-T9 and T9-  T10 with spondylosis and cord compression with myelopathy.   DISCHARGE DIAGNOSES:  T8-T9 and T9-T10 herniated nucleus pulposus plus  spondylosis with cord compression and myelopathy.   ADDITIONAL DIAGNOSES:  Acute blood loss anemia.   MAJOR OPERATION:  T7 thoracotomy by Dr. Edwyna Shell.  Decompression of T8-T9  and T9-T10 herniated nucleus pulposus and spondylosis with  reconstruction using PEEK spacers and Tamarack plate.   HOSPITAL COURSE:  Mr. Ned Kakar is a 45 year old individual who has  had significant problems with back pain, leg weakness in left lower  extremity and the right lower extremities.  He has had a herniated  nucleus pulposus on the left side at the T9-T10 level that was  previously resected.  He now has a significant spondylitic bone spur at  that level.  He has been advised regarding surgical decompression.  He  was taken to the operating room where he underwent thoracotomy and  decompression of disk herniation at T8-T9 and T9-T10.  He underwent a  resection of a significant osteophyte.  Postoperatively, the patient had  significant difficulties with considerable pain from the thoracotomy.  He was treated with initially on On-Q.  He had been on high doses of  Kadian for pain control.  He was also on Dilaudid PCA which seemed not  to control his pain very well.  He was ultimately maintained on morphine  PCA along with the Kadian.  He was gradually weaned from this medication  as the chest tubes were withdrawn.  Subsequently, he seemed to do  fairly  well.  His postoperative hematocrit, however, was noted to come back  with a hemoglobin of 9.6 and hematocrit of 27 suggesting an acute blood  loss anemia from the surgery.  No transfusions were given during this  hospitalization.  He is currently being managed with the Kadian and also  some hydrocodone 7.5/500 and he is discharged with a prescription for  #60 of these without refills.  He will be seen by his pain management  specialist, Ms. Thorton in [facility] in the next few weeks and  subsequently I will seen him in the office for further followup.   CONDITION ON DISCHARGE:  Improving.      Stefani Dama, M.D.  Electronically Signed     HJE/MEDQ  D:  08/05/2007  T:  08/06/2007  Job:  [id number]

## 2011-01-09 LAB — BLOOD GAS, ARTERIAL
Acid-Base Excess: 0.6
Bicarbonate: 24.6 — ABNORMAL HIGH
TCO2: 25.8
pCO2 arterial: 38.8

## 2011-01-09 LAB — BASIC METABOLIC PANEL
CO2: 26
CO2: 27
Calcium: 8.2 — ABNORMAL LOW
Chloride: 102
Creatinine, Ser: 0.68
GFR calc Af Amer: >60
GFR calc Af Amer: >60
GFR calc non Af Amer: >60
Glucose, Bld: 113 — ABNORMAL HIGH
Potassium: 3.5
Potassium: 3.5
Sodium: 137
Sodium: 139

## 2011-01-09 LAB — URINALYSIS, ROUTINE W REFLEX MICROSCOPIC
Bilirubin Urine: NEGATIVE
Hgb urine dipstick: NEGATIVE
Ketones, ur: NEGATIVE
Nitrite: NEGATIVE
Urobilinogen, UA: 1

## 2011-01-09 LAB — CBC
HCT: 38.9 — ABNORMAL LOW
HCT: 27 — ABNORMAL LOW
HCT: 31 — ABNORMAL LOW
Hemoglobin: 13.2
Hemoglobin: 10.3 — ABNORMAL LOW
Hemoglobin: 10.7 — ABNORMAL LOW
Hemoglobin: 9.6 — ABNORMAL LOW
MCHC: 34.1
MCHC: 34.4
MCHC: 35.5
MCV: 88.5
MCV: 87.7
MCV: 89.2
Platelets: 210
RBC: 4.4
RBC: 3.08 — ABNORMAL LOW
RBC: 3.37 — ABNORMAL LOW
RBC: 3.51 — ABNORMAL LOW
RDW: 13.5
RDW: 13.6
WBC: 11.2 — ABNORMAL HIGH
WBC: 8

## 2011-01-09 LAB — COMPREHENSIVE METABOLIC PANEL
Alkaline Phosphatase: 74
BUN: 13
BUN: 2 — ABNORMAL LOW
CO2: 22
CO2: 28
Calcium: 8.4
Chloride: 103
Creatinine, Ser: 0.72
Creatinine, Ser: 0.78
GFR calc non Af Amer: >60
GFR calc non Af Amer: >60
Glucose, Bld: 116 — ABNORMAL HIGH
Glucose, Bld: 107 — ABNORMAL HIGH
Potassium: 3.4 — ABNORMAL LOW
Total Bilirubin: 0.1 — ABNORMAL LOW
Total Protein: 5.7 — ABNORMAL LOW

## 2011-01-09 LAB — PROTIME-INR
INR: 1
Prothrombin Time: 13.1

## 2011-01-09 LAB — TYPE AND SCREEN: Antibody Screen: NEGATIVE

## 2011-01-09 LAB — APTT: aPTT: 29

## 2011-01-26 LAB — URINALYSIS, ROUTINE W REFLEX MICROSCOPIC
Nitrite: NEGATIVE
Specific Gravity, Urine: 1.012
pH: 6.5

## 2011-01-26 LAB — COMPREHENSIVE METABOLIC PANEL
Albumin: 4
BUN: 12
Calcium: 8.7
Chloride: 103
Creatinine, Ser: 0.75
GFR calc non Af Amer: >60
Total Bilirubin: 0.4

## 2011-01-26 LAB — CBC
HCT: 38.8 — ABNORMAL LOW
MCHC: 34.9
MCV: 87.9
Platelets: 236
WBC: 11.9 — ABNORMAL HIGH

## 2011-01-26 LAB — DIFFERENTIAL
Basophils Absolute: 0
Lymphocytes Relative: 14
Lymphs Abs: 1.7
Monocytes Absolute: 1 — ABNORMAL HIGH
Neutro Abs: 9.1 — ABNORMAL HIGH

## 2013-12-04 ENCOUNTER — Other Ambulatory Visit: Payer: Self-pay | Admitting: Family Medicine

## 2013-12-04 ENCOUNTER — Ambulatory Visit
Admission: RE | Admit: 2013-12-04 | Discharge: 2013-12-04 | Disposition: A | Discharge status: Home / Self Care | Payer: Medicare Other | Source: Ambulatory Visit | Attending: Family Medicine | Admitting: Family Medicine

## 2013-12-04 DIAGNOSIS — F17200 Nicotine dependence, unspecified, uncomplicated: Secondary | ICD-10-CM

## 2014-02-11 ENCOUNTER — Emergency Department (HOSPITAL_COMMUNITY)
Admission: EM | Admit: 2014-02-11 | Discharge: 2014-02-11 | Disposition: A | Discharge status: Home / Self Care | Payer: Medicare Other | Attending: Emergency Medicine | Admitting: Emergency Medicine

## 2014-02-11 ENCOUNTER — Encounter (HOSPITAL_COMMUNITY): Payer: Self-pay | Admitting: Emergency Medicine

## 2014-02-11 ENCOUNTER — Emergency Department (HOSPITAL_COMMUNITY): Payer: Medicare Other

## 2014-02-11 DIAGNOSIS — Z720 Tobacco use: Secondary | ICD-10-CM | POA: Diagnosis not present

## 2014-02-11 DIAGNOSIS — Z79899 Other long term (current) drug therapy: Secondary | ICD-10-CM | POA: Insufficient documentation

## 2014-02-11 DIAGNOSIS — R05 Cough: Secondary | ICD-10-CM

## 2014-02-11 DIAGNOSIS — R079 Chest pain, unspecified: Secondary | ICD-10-CM | POA: Insufficient documentation

## 2014-02-11 DIAGNOSIS — R6883 Chills (without fever): Secondary | ICD-10-CM | POA: Insufficient documentation

## 2014-02-11 DIAGNOSIS — J4541 Moderate persistent asthma with (acute) exacerbation: Secondary | ICD-10-CM | POA: Diagnosis not present

## 2014-02-11 DIAGNOSIS — Z8701 Personal history of pneumonia (recurrent): Secondary | ICD-10-CM | POA: Diagnosis not present

## 2014-02-11 DIAGNOSIS — R059 Cough, unspecified: Secondary | ICD-10-CM

## 2014-02-11 HISTORY — DX: Unspecified asthma, uncomplicated: J45.909

## 2014-02-11 HISTORY — DX: Pneumonia, unspecified organism: J18.9

## 2014-02-11 MED ORDER — IPRATROPIUM-ALBUTEROL 0.5-2.5 (3) MG/3ML IN SOLN
3.0000 mL | Freq: Once | RESPIRATORY_TRACT | Status: AC
  Administered 2014-02-11: 3 mL via RESPIRATORY_TRACT
  Filled 2014-02-11: qty 3

## 2014-02-11 MED ORDER — PREDNISONE 20 MG PO TABS: 40.0000 mg | ORAL_TABLET | Freq: Every day | ORAL | Status: DC

## 2014-02-11 MED ORDER — PREDNISONE 20 MG PO TABS
60.0000 mg | ORAL_TABLET | Freq: Once | ORAL | Status: AC
  Administered 2014-02-11: 60 mg via ORAL
  Filled 2014-02-11: qty 3

## 2014-02-11 NOTE — ED Notes (Signed)
Pt c/o cough and congestion x 6 days; denies fever or chills; mild expiratory wheezes noted after treatment. Pt reports "It feels more lose now"

## 2014-02-11 NOTE — ED Notes (Signed)
Pt. reports persistent non  productive cough and chest congestion onset 2 days ago , denies fever or chills.

## 2014-02-11 NOTE — ED Provider Notes (Signed)
Medical screening examination/treatment/procedure(s) were performed by non-physician practitioner and as supervising physician I was immediately available for consultation/collaboration.   EKG Interpretation None        Everlene Balls, MD 02/11/14 [phone]

## 2014-02-11 NOTE — ED Provider Notes (Signed)
CSN: [id number]     Arrival date & time 02/11/14  0246 History   First MD Initiated Contact with Patient 02/11/14 0345     Chief Complaint  Patient presents with  . Cough     (Consider location/radiation/quality/duration/timing/severity/associated sxs/prior Treatment) HPI Comments: This is a patient with history of asthma and chronic bronchitis.  He still smokes.  States for the past week he's been using his inhaler without any relief.  Denies any fever.  Does report chills  Patient is a 48 y.o. male presenting with cough. The history is provided by the patient.  Cough Cough characteristics:  Non-productive Severity:  Moderate Onset quality:  Gradual Timing:  Intermittent Progression:  Unchanged Chronicity:  Recurrent Smoker: yes   Relieved by:  Nothing Worsened by:  Activity Ineffective treatments:  Beta-agonist inhaler Associated symptoms: chills, shortness of breath and wheezing   Associated symptoms: no fever and no rhinorrhea   Shortness of breath:    Severity:  Mild   Past Medical History  Diagnosis Date  . Asthma   . Pneumonia    Past Surgical History  Procedure Laterality Date  . Back surgery     No family history on file. History  Substance Use Topics  . Smoking status: Current Every Day Smoker  . Smokeless tobacco: Not on file  . Alcohol Use: No    Review of Systems  Constitutional: Positive for chills. Negative for fever.  HENT: Negative for rhinorrhea.   Respiratory: Positive for cough, chest tightness, shortness of breath and wheezing.   Gastrointestinal: Negative for nausea and vomiting.      Allergies  Celecoxib; Clonazepam; Cyclobenzaprine; Dexamethasone; Diazepam; Etodolac; Gabapentin; Metaxalone; Methocarbamol; Pregabalin; Propoxyphene; and Tramadol  Home Medications   Prior to Admission medications   Medication Sig Start Date End Date Taking? Authorizing Provider  ALPRAZolam Duanne Moron) 0.5 MG tablet Take 0.5 mg by mouth 3 (three) times  daily as needed for anxiety.    Yes Historical Provider, MD  oxymorphone (OPANA ER) 40 MG 12 hr tablet Take 40 mg by mouth 3 (three) times daily.  02/24/14 03/26/14 Yes Historical Provider, MD  temazepam (RESTORIL) 15 MG capsule Take 15 mg by mouth at bedtime.  01/04/14  Yes Historical Provider, MD  predniSONE (DELTASONE) 20 MG tablet Take 2 tablets (40 mg total) by mouth daily with breakfast. 02/11/14   Garald Balding, NP   BP 110/56  Pulse 70  Temp(Src) 98.1 F (36.7 C) (Oral)  Resp 13  SpO2 97% Physical Exam  Nursing note and vitals reviewed. Constitutional: He appears well-developed and well-nourished.  HENT:  Head: Normocephalic.  Eyes: Pupils are equal, round, and reactive to light.  Neck: Normal range of motion.  Cardiovascular: Normal rate and regular rhythm.   Pulmonary/Chest: Effort normal.  Abdominal: Soft.  Musculoskeletal: Normal range of motion.  Neurological: He is alert.  Skin: Skin is warm.    ED Course  Procedures (including critical care time) Labs Review Labs Reviewed - No data to display  Imaging Review Dg Chest 2 View  02/11/2014   CLINICAL DATA:  Chest tightness and shortness of breath.  EXAM: CHEST  2 VIEW  COMPARISON:  12/04/2013  FINDINGS: Linear opacity left lower lobe, favor scarring. Hyperinflation with increased AP diameter. Mild interstitial coarsening. No new consolidation, pleural effusion, pneumothorax. Cardiomediastinal contours within normal range. Diffuse osteopenia. Sequelae of prior posterior left rib fracture. Lateral mid thoracic fusion hardware again noted.  IMPRESSION: Chronic changes as above.  No definite acute process.  Electronically Signed   By: Carlos Levering M.D.   On: 02/11/2014 03:45     EKG Interpretation None     After neb treat  Feeling better  Will DC home with Rx for Prednisone  MDM   Final diagnoses:  Asthma, moderate persistent, with acute exacerbation         Garald Balding, NP 02/11/14 [phone]

## 2014-02-11 NOTE — Discharge Instructions (Signed)
Asthma, Acute Bronchospasm °Acute bronchospasm caused by asthma is also referred to as an asthma attack. Bronchospasm means your air passages become narrowed. The narrowing is caused by inflammation and tightening of the muscles in the air tubes (bronchi) in your lungs. This can make it hard to breathe or cause you to wheeze and cough. °CAUSES °Possible triggers are: °· Animal dander from the skin, hair, or feathers of animals. °· Dust mites contained in house dust. °· Cockroaches. °· Pollen from trees or grass. °· Mold. °· Cigarette or tobacco smoke. °· Air pollutants such as dust, household cleaners, hair sprays, aerosol sprays, paint fumes, strong chemicals, or strong odors. °· Cold air or weather changes. Cold air may trigger inflammation. Winds increase molds and pollens in the air. °· Strong emotions such as crying or laughing hard. °· Stress. °· Certain medicines such as aspirin or beta-blockers. °· Sulfites in foods and drinks, such as dried fruits and wine. °· Infections or inflammatory conditions, such as a flu, cold, or inflammation of the nasal membranes (rhinitis). °· Gastroesophageal reflux disease (GERD). GERD is a condition where stomach acid backs up into your esophagus. °· Exercise or strenuous activity. °SIGNS AND SYMPTOMS  °· Wheezing. °· Excessive coughing, particularly at night. °· Chest tightness. °· Shortness of breath. °DIAGNOSIS  °Your health care provider will ask you about your medical history and perform a physical exam. A chest X-ray or blood testing may be performed to look for other causes of your symptoms or other conditions that may have triggered your asthma attack.  °TREATMENT  °Treatment is aimed at reducing inflammation and opening up the airways in your lungs.  Most asthma attacks are treated with inhaled medicines. These include quick relief or rescue medicines (such as bronchodilators) and controller medicines (such as inhaled corticosteroids). These medicines are sometimes  given through an inhaler or a nebulizer. Systemic steroid medicine taken by mouth or given through an IV tube also can be used to reduce the inflammation when an attack is moderate or severe. Antibiotic medicines are only used if a bacterial infection is present.  °HOME CARE INSTRUCTIONS  °· Rest. °· Drink plenty of liquids. This helps the mucus to remain thin and be easily coughed up. Only use caffeine in moderation and do not use alcohol until you have recovered from your illness. °· Do not smoke. Avoid being exposed to secondhand smoke. °· You play a critical role in keeping yourself in good health. Avoid exposure to things that cause you to wheeze or to have breathing problems. °· Keep your medicines up-to-date and available. Carefully follow your health care provider's treatment plan. °· Take your medicine exactly as prescribed. °· When pollen or pollution is bad, keep windows closed and use an air conditioner or go to places with air conditioning. °· Asthma requires careful medical care. See your health care provider for a follow-up as advised. If you are more than [redacted] weeks pregnant and you were prescribed any new medicines, let your obstetrician know about the visit and how you are doing. Follow up with your health care provider as directed. °· After you have recovered from your asthma attack, make an appointment with your outpatient doctor to talk about ways to reduce the likelihood of future attacks. If you do not have a doctor who manages your asthma, make an appointment with a primary care doctor to discuss your asthma. °SEEK IMMEDIATE MEDICAL CARE IF:  °· You are getting worse. °· You have trouble breathing. If severe, call your local   emergency services (911 in the [place]  You develop chest pain or discomfort.  You are vomiting.  You are not able to keep fluids down.  You are coughing up yellow, green, brown, or bloody sputum.  You have a fever and your symptoms suddenly get worse.  You have  trouble swallowing. MAKE SURE YOU:   Understand these instructions.  Will watch your condition.  Will get help right away if you are not doing well or get worse. Document Released: 07/18/2006 Document Revised: 04/07/2013 Document Reviewed: 10/08/2012 [facility] Patient Information 2015 [place], [place]. This information is not intended to replace advice given to you by your health care provider. Make sure you discuss any questions you have with your health care provider. Take the steroid on a regular basis at the same time of day daily for 5 days   Use your inhaler as follows 2 puffs every 4-6 hours while awake for 2 days than as needed  If you wake during the night feeling short of breath or are wheezing you can use the inhaler at that time

## 2016-01-15 DIAGNOSIS — R6521 Severe sepsis with septic shock: Secondary | ICD-10-CM

## 2016-01-15 DIAGNOSIS — A419 Sepsis, unspecified organism: Secondary | ICD-10-CM

## 2016-01-15 DIAGNOSIS — K631 Perforation of intestine (nontraumatic): Secondary | ICD-10-CM

## 2016-01-15 HISTORY — DX: Perforation of intestine (nontraumatic): K63.1

## 2016-01-15 HISTORY — DX: Sepsis, unspecified organism: A41.9

## 2016-01-15 HISTORY — DX: Severe sepsis with septic shock: R65.21

## 2016-01-27 ENCOUNTER — Encounter (HOSPITAL_COMMUNITY): Admission: EM | Disposition: A | Discharge status: Home Health | Payer: Self-pay | Source: Home / Self Care

## 2016-01-27 ENCOUNTER — Emergency Department (HOSPITAL_COMMUNITY): Payer: Worker's Compensation

## 2016-01-27 ENCOUNTER — Emergency Department (HOSPITAL_COMMUNITY): Payer: Worker's Compensation | Admitting: Certified Registered"

## 2016-01-27 ENCOUNTER — Encounter (HOSPITAL_COMMUNITY): Payer: Self-pay | Admitting: Emergency Medicine

## 2016-01-27 ENCOUNTER — Inpatient Hospital Stay (HOSPITAL_COMMUNITY)
Admission: EM | Admit: 2016-01-27 | Discharge: 2016-02-02 | DRG: 329 | Disposition: A | Discharge status: Home Health | Payer: Worker's Compensation | Attending: General Surgery | Admitting: General Surgery

## 2016-01-27 DIAGNOSIS — K631 Perforation of intestine (nontraumatic): Secondary | ICD-10-CM | POA: Diagnosis present

## 2016-01-27 DIAGNOSIS — Z886 Allergy status to analgesic agent status: Secondary | ICD-10-CM

## 2016-01-27 DIAGNOSIS — F17210 Nicotine dependence, cigarettes, uncomplicated: Secondary | ICD-10-CM | POA: Diagnosis present

## 2016-01-27 DIAGNOSIS — M549 Dorsalgia, unspecified: Secondary | ICD-10-CM | POA: Diagnosis present

## 2016-01-27 DIAGNOSIS — J45909 Unspecified asthma, uncomplicated: Secondary | ICD-10-CM | POA: Diagnosis present

## 2016-01-27 DIAGNOSIS — R109 Unspecified abdominal pain: Secondary | ICD-10-CM | POA: Diagnosis present

## 2016-01-27 DIAGNOSIS — K5000 Crohn's disease of small intestine without complications: Secondary | ICD-10-CM | POA: Diagnosis present

## 2016-01-27 DIAGNOSIS — Z79891 Long term (current) use of opiate analgesic: Secondary | ICD-10-CM

## 2016-01-27 DIAGNOSIS — K650 Generalized (acute) peritonitis: Secondary | ICD-10-CM | POA: Diagnosis present

## 2016-01-27 DIAGNOSIS — Z885 Allergy status to narcotic agent status: Secondary | ICD-10-CM | POA: Diagnosis not present

## 2016-01-27 DIAGNOSIS — G8929 Other chronic pain: Secondary | ICD-10-CM | POA: Diagnosis present

## 2016-01-27 DIAGNOSIS — K567 Ileus, unspecified: Secondary | ICD-10-CM | POA: Diagnosis not present

## 2016-01-27 DIAGNOSIS — K668 Other specified disorders of peritoneum: Secondary | ICD-10-CM

## 2016-01-27 DIAGNOSIS — Z888 Allergy status to other drugs, medicaments and biological substances status: Secondary | ICD-10-CM | POA: Diagnosis not present

## 2016-01-27 HISTORY — PX: EXPLORATORY LAPAROTOMY: SUR591

## 2016-01-27 HISTORY — DX: Other complications of anesthesia, initial encounter: T88.59XA

## 2016-01-27 HISTORY — DX: Adverse effect of unspecified anesthetic, initial encounter: T41.45XA

## 2016-01-27 HISTORY — PX: LAPAROTOMY: SHX154

## 2016-01-27 HISTORY — DX: Perforation of intestine (nontraumatic): K63.1

## 2016-01-27 HISTORY — PX: BOWEL RESECTION: SHX1257

## 2016-01-27 LAB — CBC
HCT: 42 % (ref 39.0–52.0)
Hemoglobin: 14 g/dL (ref 13.0–17.0)
MCH: 27.6 pg (ref 26.0–34.0)
MCHC: 33.3 g/dL (ref 30.0–36.0)
MCV: 82.8 fL (ref 78.0–100.0)
PLATELETS: 249 10*3/uL (ref 150–400)
RBC: 5.07 MIL/uL (ref 4.22–5.81)
RDW: 14.9 % (ref 11.5–15.5)
WBC: 7.1 10*3/uL (ref 4.0–10.5)

## 2016-01-27 LAB — URINE MICROSCOPIC-ADD ON

## 2016-01-27 LAB — COMPREHENSIVE METABOLIC PANEL
ALK PHOS: 83 U/L (ref 38–126)
ALT: 12 U/L — AB (ref 17–63)
AST: 23 U/L (ref 15–41)
Albumin: 3.1 g/dL — ABNORMAL LOW (ref 3.5–5.0)
Anion gap: 13 (ref 5–15)
BUN: 18 mg/dL (ref 6–20)
CALCIUM: 8.4 mg/dL — AB (ref 8.9–10.3)
CHLORIDE: 98 mmol/L — AB (ref 101–111)
CO2: 25 mmol/L (ref 22–32)
CREATININE: 0.95 mg/dL (ref 0.61–1.24)
GFR calc Af Amer: >60 mL/min (ref >60)
GFR calc non Af Amer: >60 mL/min (ref >60)
Glucose, Bld: 97 mg/dL (ref 65–99)
Potassium: 3.7 mmol/L (ref 3.5–5.1)
Sodium: 136 mmol/L (ref 135–145)
Total Bilirubin: 0.6 mg/dL (ref 0.3–1.2)
Total Protein: 6.5 g/dL (ref 6.5–8.1)

## 2016-01-27 LAB — URINALYSIS, ROUTINE W REFLEX MICROSCOPIC
BILIRUBIN URINE: NEGATIVE
GLUCOSE, UA: NEGATIVE mg/dL
KETONES UR: 40 mg/dL — AB
Leukocytes, UA: NEGATIVE
Nitrite: NEGATIVE
PROTEIN: 30 mg/dL — AB
Specific Gravity, Urine: 1.03 (ref 1.005–1.030)
pH: 5.5 (ref 5.0–8.0)

## 2016-01-27 LAB — I-STAT CG4 LACTIC ACID, ED: Lactic Acid, Venous: 4.61 mmol/L (ref 0.5–1.9)

## 2016-01-27 LAB — LIPASE, BLOOD: Lipase: 15 U/L (ref 11–51)

## 2016-01-27 SURGERY — LAPAROTOMY, EXPLORATORY
Anesthesia: General | Site: Abdomen

## 2016-01-27 MED ORDER — MORPHINE SULFATE 2 MG/ML IV SOLN
INTRAVENOUS | Status: AC
  Filled 2016-01-27: qty 25

## 2016-01-27 MED ORDER — FENTANYL CITRATE (PF) 100 MCG/2ML IJ SOLN
INTRAMUSCULAR | Status: DC | PRN
  Administered 2016-01-27 (×3): 100 ug via INTRAVENOUS

## 2016-01-27 MED ORDER — ONDANSETRON HCL 4 MG/2ML IJ SOLN
INTRAMUSCULAR | Status: DC | PRN
  Administered 2016-01-27: 4 mg via INTRAVENOUS

## 2016-01-27 MED ORDER — SUGAMMADEX SODIUM 200 MG/2ML IV SOLN
INTRAVENOUS | Status: DC | PRN
  Administered 2016-01-27: 300 mg via INTRAVENOUS

## 2016-01-27 MED ORDER — ONDANSETRON HCL 4 MG/2ML IJ SOLN
INTRAMUSCULAR | Status: AC
  Filled 2016-01-27: qty 2

## 2016-01-27 MED ORDER — PROMETHAZINE HCL 25 MG/ML IJ SOLN: 6.2500 mg | INTRAMUSCULAR | Status: DC | PRN

## 2016-01-27 MED ORDER — ROCURONIUM BROMIDE 10 MG/ML (PF) SYRINGE
PREFILLED_SYRINGE | INTRAVENOUS | Status: AC
  Filled 2016-01-27: qty 10

## 2016-01-27 MED ORDER — LACTATED RINGERS IV SOLN
INTRAVENOUS | Status: DC | PRN
  Administered 2016-01-27 (×2): via INTRAVENOUS

## 2016-01-27 MED ORDER — ORAL CARE MOUTH RINSE
15.0000 mL | Freq: Two times a day (BID) | OROMUCOSAL | Status: DC
  Administered 2016-01-27 – 2016-01-30 (×6): 15 mL via OROMUCOSAL

## 2016-01-27 MED ORDER — LIDOCAINE 2% (20 MG/ML) 5 ML SYRINGE
INTRAMUSCULAR | Status: AC
  Filled 2016-01-27: qty 5

## 2016-01-27 MED ORDER — POVIDONE-IODINE 10 % EX OINT
TOPICAL_OINTMENT | CUTANEOUS | Status: AC
  Filled 2016-01-27: qty 28.35

## 2016-01-27 MED ORDER — MIDAZOLAM HCL 2 MG/2ML IJ SOLN
INTRAMUSCULAR | Status: DC | PRN
  Administered 2016-01-27: 2 mg via INTRAVENOUS

## 2016-01-27 MED ORDER — FENTANYL CITRATE (PF) 100 MCG/2ML IJ SOLN
INTRAMUSCULAR | Status: AC
  Filled 2016-01-27: qty 2

## 2016-01-27 MED ORDER — SODIUM CHLORIDE 0.9 % IV BOLUS (SEPSIS)
1000.0000 mL | Freq: Once | INTRAVENOUS | Status: AC
  Administered 2016-01-27: 1000 mL via INTRAVENOUS

## 2016-01-27 MED ORDER — SUCCINYLCHOLINE CHLORIDE 20 MG/ML IJ SOLN
INTRAMUSCULAR | Status: DC | PRN
  Administered 2016-01-27: 140 mg via INTRAVENOUS

## 2016-01-27 MED ORDER — DIPHENHYDRAMINE HCL 12.5 MG/5ML PO ELIX: 12.5000 mg | ORAL_SOLUTION | Freq: Four times a day (QID) | ORAL | Status: DC | PRN

## 2016-01-27 MED ORDER — KETAMINE HCL 100 MG/ML IJ SOLN
INTRAMUSCULAR | Status: AC
  Filled 2016-01-27: qty 1

## 2016-01-27 MED ORDER — MEPERIDINE HCL 25 MG/ML IJ SOLN: 6.2500 mg | INTRAMUSCULAR | Status: DC | PRN

## 2016-01-27 MED ORDER — NALOXONE HCL 0.4 MG/ML IJ SOLN: 0.4000 mg | INTRAMUSCULAR | Status: DC | PRN

## 2016-01-27 MED ORDER — ONDANSETRON HCL 4 MG/2ML IJ SOLN
4.0000 mg | Freq: Once | INTRAMUSCULAR | Status: AC
  Administered 2016-01-27: 4 mg via INTRAVENOUS
  Filled 2016-01-27: qty 2

## 2016-01-27 MED ORDER — ROCURONIUM BROMIDE 100 MG/10ML IV SOLN
INTRAVENOUS | Status: DC | PRN
  Administered 2016-01-27: 20 mg via INTRAVENOUS
  Administered 2016-01-27 (×2): 10 mg via INTRAVENOUS
  Administered 2016-01-27: 30 mg via INTRAVENOUS
  Administered 2016-01-27: 10 mg via INTRAVENOUS

## 2016-01-27 MED ORDER — HYDROMORPHONE HCL 1 MG/ML IJ SOLN
1.0000 mg | Freq: Once | INTRAMUSCULAR | Status: AC
  Administered 2016-01-27: 1 mg via INTRAVENOUS
  Filled 2016-01-27: qty 1

## 2016-01-27 MED ORDER — ENOXAPARIN SODIUM 40 MG/0.4ML ~~LOC~~ SOLN
40.0000 mg | SUBCUTANEOUS | Status: DC
  Administered 2016-01-28 – 2016-02-02 (×6): 40 mg via SUBCUTANEOUS
  Filled 2016-01-27 (×5): qty 0.4

## 2016-01-27 MED ORDER — DIPHENHYDRAMINE HCL 50 MG/ML IJ SOLN
12.5000 mg | Freq: Four times a day (QID) | INTRAMUSCULAR | Status: DC | PRN
  Administered 2016-01-28: 12.5 mg via INTRAVENOUS
  Filled 2016-01-27: qty 1

## 2016-01-27 MED ORDER — MORPHINE SULFATE 2 MG/ML IV SOLN
INTRAVENOUS | Status: DC
  Administered 2016-01-27: 19:00:00 via INTRAVENOUS
  Administered 2016-01-27: 1.5 mg via INTRAVENOUS
  Administered 2016-01-28: 25.5 mg via INTRAVENOUS
  Administered 2016-01-28: 11:00:00 via INTRAVENOUS
  Administered 2016-01-28: 22.5 mg via INTRAVENOUS
  Administered 2016-01-28: 15 mg via INTRAVENOUS
  Administered 2016-01-28: 25 mg via INTRAVENOUS
  Administered 2016-01-28: 19:00:00 via INTRAVENOUS
  Administered 2016-01-28: 3 mg via INTRAVENOUS
  Administered 2016-01-28: 1 mg via INTRAVENOUS
  Administered 2016-01-29: 17 mg via INTRAVENOUS
  Filled 2016-01-27 (×4): qty 25

## 2016-01-27 MED ORDER — KETAMINE HCL 10 MG/ML IJ SOLN
INTRAMUSCULAR | Status: DC | PRN
  Administered 2016-01-27: 20 mg via INTRAVENOUS
  Administered 2016-01-27: 70 mg via INTRAVENOUS
  Administered 2016-01-27 (×2): 10 mg via INTRAVENOUS

## 2016-01-27 MED ORDER — KCL IN DEXTROSE-NACL 20-5-0.45 MEQ/L-%-% IV SOLN
INTRAVENOUS | Status: DC
  Administered 2016-01-27 – 2016-01-28 (×2): via INTRAVENOUS
  Administered 2016-01-28: 1 mL via INTRAVENOUS
  Administered 2016-01-28 – 2016-01-29 (×3): via INTRAVENOUS
  Administered 2016-01-30: 1 mL via INTRAVENOUS
  Administered 2016-01-30: 01:00:00 via INTRAVENOUS
  Administered 2016-01-31: 1 mL via INTRAVENOUS
  Administered 2016-01-31 – 2016-02-01 (×3): via INTRAVENOUS
  Filled 2016-01-27 (×16): qty 1000

## 2016-01-27 MED ORDER — LIDOCAINE HCL (CARDIAC) 20 MG/ML IV SOLN
INTRAVENOUS | Status: DC | PRN
  Administered 2016-01-27: 100 mg via INTRAVENOUS

## 2016-01-27 MED ORDER — FENTANYL CITRATE (PF) 100 MCG/2ML IJ SOLN
50.0000 ug | INTRAMUSCULAR | Status: DC | PRN
Start: 2016-01-27 — End: 2016-02-02
  Administered 2016-01-27 – 2016-02-02 (×16): 50 ug via INTRAVENOUS
  Filled 2016-01-27 (×17): qty 2

## 2016-01-27 MED ORDER — PIPERACILLIN-TAZOBACTAM 3.375 G IVPB
3.3750 g | Freq: Three times a day (TID) | INTRAVENOUS | Status: AC
  Administered 2016-01-27 – 2016-01-29 (×7): 3.375 g via INTRAVENOUS
  Filled 2016-01-27 (×11): qty 50

## 2016-01-27 MED ORDER — PROPOFOL 10 MG/ML IV BOLUS
INTRAVENOUS | Status: DC | PRN
  Administered 2016-01-27 (×2): 50 mg via INTRAVENOUS

## 2016-01-27 MED ORDER — ONDANSETRON HCL 4 MG/2ML IJ SOLN: 4.0000 mg | Freq: Four times a day (QID) | INTRAMUSCULAR | Status: DC | PRN

## 2016-01-27 MED ORDER — SODIUM CHLORIDE 0.9 % IV SOLN
INTRAVENOUS | Status: DC
  Administered 2016-01-27: 06:00:00 via INTRAVENOUS

## 2016-01-27 MED ORDER — MIDAZOLAM HCL 2 MG/2ML IJ SOLN
INTRAMUSCULAR | Status: AC
  Filled 2016-01-27: qty 2

## 2016-01-27 MED ORDER — FENTANYL CITRATE (PF) 100 MCG/2ML IJ SOLN
INTRAMUSCULAR | Status: AC
  Filled 2016-01-27: qty 4

## 2016-01-27 MED ORDER — SUGAMMADEX SODIUM 500 MG/5ML IV SOLN
INTRAVENOUS | Status: AC
  Filled 2016-01-27: qty 5

## 2016-01-27 MED ORDER — FAMOTIDINE IN NACL 20-0.9 MG/50ML-% IV SOLN
20.0000 mg | Freq: Two times a day (BID) | INTRAVENOUS | Status: DC
  Administered 2016-01-27 – 2016-02-02 (×12): 20 mg via INTRAVENOUS
  Filled 2016-01-27 (×14): qty 50

## 2016-01-27 MED ORDER — 0.9 % SODIUM CHLORIDE (POUR BTL) OPTIME
TOPICAL | Status: DC | PRN
  Administered 2016-01-27 (×3): 1000 mL
  Administered 2016-01-27: 2000 mL

## 2016-01-27 MED ORDER — HYDROMORPHONE HCL 1 MG/ML IJ SOLN
1.0000 mg | INTRAMUSCULAR | Status: DC | PRN
  Administered 2016-01-27 (×2): 1 mg via INTRAVENOUS
  Filled 2016-01-27 (×3): qty 1

## 2016-01-27 MED ORDER — HYDROMORPHONE HCL 1 MG/ML IJ SOLN
INTRAMUSCULAR | Status: AC
  Filled 2016-01-27: qty 1

## 2016-01-27 MED ORDER — HYDROMORPHONE HCL 1 MG/ML IJ SOLN
0.2500 mg | INTRAMUSCULAR | Status: DC | PRN
  Administered 2016-01-27 (×4): 0.5 mg via INTRAVENOUS

## 2016-01-27 MED ORDER — SUCCINYLCHOLINE CHLORIDE 200 MG/10ML IV SOSY
PREFILLED_SYRINGE | INTRAVENOUS | Status: AC
  Filled 2016-01-27: qty 10

## 2016-01-27 MED ORDER — SODIUM CHLORIDE 0.9% FLUSH: 9.0000 mL | INTRAVENOUS | Status: DC | PRN

## 2016-01-27 MED ORDER — PROPOFOL 10 MG/ML IV BOLUS
INTRAVENOUS | Status: AC
  Filled 2016-01-27: qty 20

## 2016-01-27 SURGICAL SUPPLY — 45 items
BLADE SURG ROTATE 9660 (MISCELLANEOUS) ×4 IMPLANT
CANISTER SUCTION 2500CC (MISCELLANEOUS) ×4 IMPLANT
CHLORAPREP W/TINT 26ML (MISCELLANEOUS) ×4 IMPLANT
COVER SURGICAL LIGHT HANDLE (MISCELLANEOUS) ×4 IMPLANT
DRAPE LAPAROSCOPIC ABDOMINAL (DRAPES) ×4 IMPLANT
DRAPE UTILITY XL STRL (DRAPES) ×8 IMPLANT
DRAPE WARM FLUID 44X44 (DRAPE) ×4 IMPLANT
ELECT BLADE 6.5 EXT (BLADE) ×4 IMPLANT
ELECT CAUTERY BLADE 6.4 (BLADE) ×4 IMPLANT
ELECT REM PT RETURN 9FT ADLT (ELECTROSURGICAL) ×4
ELECTRODE REM PT RTRN 9FT ADLT (ELECTROSURGICAL) ×2 IMPLANT
GAUZE SPONGE 4X4 12PLY STRL (GAUZE/BANDAGES/DRESSINGS) ×4 IMPLANT
GLOVE BIOGEL M STRL SZ7.5 (GLOVE) ×12 IMPLANT
GLOVE BIOGEL PI IND STRL 8 (GLOVE) ×2 IMPLANT
GLOVE BIOGEL PI INDICATOR 8 (GLOVE) ×2
GLOVE EUDERMIC 7 POWDERFREE (GLOVE) ×4 IMPLANT
GLOVE SURG SS PI 6.0 STRL IVOR (GLOVE) ×4 IMPLANT
GOWN STRL REUS W/ TWL LRG LVL3 (GOWN DISPOSABLE) ×4 IMPLANT
GOWN STRL REUS W/TWL 2XL LVL3 (GOWN DISPOSABLE) ×4 IMPLANT
GOWN STRL REUS W/TWL LRG LVL3 (GOWN DISPOSABLE) ×8
KIT BASIN OR (CUSTOM PROCEDURE TRAY) ×4 IMPLANT
KIT ROOM TURNOVER OR (KITS) ×4 IMPLANT
LIGASURE IMPACT 36 18CM CVD LR (INSTRUMENTS) ×4 IMPLANT
NS IRRIG 1000ML POUR BTL (IV SOLUTION) ×8 IMPLANT
PACK GENERAL/GYN (CUSTOM PROCEDURE TRAY) ×4 IMPLANT
PAD ARMBOARD 7.5X6 YLW CONV (MISCELLANEOUS) ×8 IMPLANT
RELOAD PROXIMATE 75MM BLUE (ENDOMECHANICALS) ×8 IMPLANT
RELOAD STAPLER LINE PROX 60 GR (STAPLE) ×2 IMPLANT
STAPLER PROXIMATE 75MM BLUE (STAPLE) ×4 IMPLANT
STAPLER RELOAD LINE PROX 60 GR (STAPLE) ×4
STAPLER VISISTAT 35W (STAPLE) ×4 IMPLANT
SUCTION POOLE TIP (SUCTIONS) ×4 IMPLANT
SUT PDS AB 1 TP1 96 (SUTURE) ×8 IMPLANT
SUT SILK 2 0 SH CR/8 (SUTURE) ×4 IMPLANT
SUT SILK 2 0 TIES 10X30 (SUTURE) ×4 IMPLANT
SUT SILK 3 0 SH CR/8 (SUTURE) ×8 IMPLANT
SUT SILK 3 0 TIES 10X30 (SUTURE) ×4 IMPLANT
SUT VIC AB 3-0 SH 18 (SUTURE) ×4 IMPLANT
SWAB COLLECTION DEVICE MRSA (MISCELLANEOUS) ×4 IMPLANT
SWAB CULTURE ESWAB REG 1ML (MISCELLANEOUS) ×4 IMPLANT
TAPE CLOTH SURG 4X10 WHT LF (GAUZE/BANDAGES/DRESSINGS) ×4 IMPLANT
TOWEL OR 17X24 6PK STRL BLUE (TOWEL DISPOSABLE) ×4 IMPLANT
TOWEL OR 17X26 10 PK STRL BLUE (TOWEL DISPOSABLE) ×4 IMPLANT
TRAY FOLEY CATH 14FRSI W/METER (CATHETERS) ×4 IMPLANT
YANKAUER SUCT BULB TIP NO VENT (SUCTIONS) ×8 IMPLANT

## 2016-01-27 NOTE — ED Triage Notes (Signed)
Pt states he is been having constant generalized abd pain for a week,  LBM last Saturday, pt states he thinks he has a Bowel blockage  C/0 10/10 took magnesium citrate given by pharmacy with no relief. States he is been having chills, nausea and vomiting.

## 2016-01-27 NOTE — Interval H&P Note (Signed)
History and Physical Interval Note:  01/27/2016 9:51 AM  [name]  has presented today for surgery, with the diagnosis of perforated viscus  The various methods of treatment have been discussed with the patient and family. After consideration of risks, benefits and other options for treatment, the patient has consented to  Procedure(s): EXPLORATORY LAPAROTOMY (N/A) POSSIBLE SMALL BOWEL RESECTION (N/A) POSSIBLE COLOSTOMY (N/A) as a surgical intervention .  The patient's history has been reviewed, patient examined, no change in status, stable for surgery.  I have reviewed the patient's chart and labs.  Questions were answered to the patient's satisfaction.    Possible bowel resection, possible ostomy  Pt seen examined/imaging/chart reviewed Free air Takes goody's 2 per day Issues with contstipation  Ill appearing, looks malnourished Mild distension, diffuse peritonitis  Perforated viscous Could be perf colon due to constipation issue or gastric ulcer due to Goody's  Has peritonitis Needs emergent laparotomy.  Broad spectrum abx  I discussed the procedure in detail.    We discussed the risks and benefits of surgery including, but not limited to bleeding, infection (such as wound infection, abdominal abscess), injury to surrounding structures, blood clot formation, urinary retention, incisional hernia, anastomotic stricture, anastomotic leak, possible ostomy, anesthesia risks, pulmonary & cardiac complications such as pneumonia &/or heart attack, need for additional procedures, ileus, & prolonged hospitalization.  We discussed the typical postoperative recovery course, including limitations & restrictions postoperatively. I explained that the likelihood of improvement in their symptoms is good but high risk for postop complications  Leighton Ruff. Redmond Pulling, MD, [place], Bariatric, & Minimally Invasive Surgery [facility], [place] .   Gayland Curry

## 2016-01-27 NOTE — Progress Notes (Signed)
Pt arrived to unit from PACU at 1515. PCA infusing. NG tube right nare to low intermittent suction.   Family at bedside.  Admission nurse notified of arrival.

## 2016-01-27 NOTE — Anesthesia Preprocedure Evaluation (Addendum)
Anesthesia Evaluation  Patient identified by MRN, date of birth, ID band Patient awake    Reviewed: Allergy & Precautions, NPO status , [name] Chart, lab work & pertinent test results  Airway Mallampati: II  TM Distance: >3 FB Neck ROM: Full    Dental  (+) Edentulous Upper, Edentulous Lower   Pulmonary asthma , pneumonia, Current Smoker,    breath sounds clear to auscultation + decreased breath sounds      Cardiovascular negative cardio ROS Normal cardiovascular exam Rhythm:Regular Rate:Normal     Neuro/Psych negative neurological ROS  negative psych ROS   GI/Hepatic negative GI ROS, Neg liver ROS,   Endo/Other  negative endocrine ROS  Renal/GU negative Renal ROS     Musculoskeletal negative musculoskeletal ROS (+)   Abdominal   Peds  Hematology negative hematology ROS (+)   Anesthesia Other Findings   Reproductive/Obstetrics                            Anesthesia Physical Anesthesia Plan  ASA: III and emergent  Anesthesia Plan: General   Post-op Pain Management:    Induction: Intravenous, Rapid sequence and Cricoid pressure planned  Airway Management Planned: Oral ETT  Additional Equipment:   Intra-op Plan:   Post-operative Plan: Extubation in OR  Informed Consent: I have reviewed the patients History and Physical, chart, labs and discussed the procedure including the risks, benefits and alternatives for the proposed anesthesia with the patient or authorized representative who has indicated his/her understanding and acceptance.   Dental advisory given  Plan Discussed with: CRNA  Anesthesia Plan Comments:        Anesthesia Quick Evaluation

## 2016-01-27 NOTE — Brief Op Note (Signed)
01/27/2016  12:03 PM  PATIENT:  [name]  50 y.o. male  PRE-OPERATIVE DIAGNOSIS:  perforated viscus  POST-OPERATIVE DIAGNOSIS:  Distal small bowel perforation  PROCEDURE:  Procedure(s): EXPLORATORY LAPAROTOMY (N/A) SMALL BOWEL RESECTION (N/A)  SURGEON:  Surgeon(s) and Role:    * Greer Pickerel, MD - Primary    * Fanny Skates, MD - Assisting  PHYSICIAN ASSISTANT:   ASSISTANTS: see above   ANESTHESIA:   general  EBL:  Total I/O In: 2000 [I.V.:1000; IV Piggyback:1000] Out: -   BLOOD ADMINISTERED:none  DRAINS: Nasogastric Tube and Urinary Catheter (Foley)   LOCAL MEDICATIONS USED:  NONE  SPECIMEN:  Source of Specimen:  distal small bowel, stitch marks perforation  DISPOSITION OF SPECIMEN:  PATHOLOGY  COUNTS:  YES  TOURNIQUET:  * No tourniquets in log *  DICTATION: .Other Dictation: Dictation Number no dictation number given  PLAN OF CARE: Admit to inpatient   PATIENT DISPOSITION:  PACU - hemodynamically stable.   Delay start of Pharmacological VTE agent (>24hrs) due to surgical blood loss or risk of bleeding: no  Leighton Ruff. Redmond Pulling, MD, FACS General, Bariatric, & Minimally Invasive Surgery [facility], [place]

## 2016-01-27 NOTE — H&P (Signed)
[name] is an 50 y.o. male.   Chief Complaint: abdominal pain HPI: [name] has chronic pain following multiple back surgeries in the past. He developed significant constipation leading up to last Saturday. He took some magnesium citrate, which was recommended by a pharmacist, and had a large bowel movement. Since then, he has had progressively worsening abdominal pain. He has only been drinking water. He has not eaten any food. He has not been smoking although he is a smoker by history. He has felt progressively worse. His abdominal pain became more severe and he came to the emergency department this morning. Plain films demonstrate some free intraperitoneal air. I was asked to see him in consultation.  Past Medical History:  Diagnosis Date  . Asthma   . Pneumonia     Past Surgical History:  Procedure Laterality Date  . BACK SURGERY      History reviewed. No pertinent family history. Social History:  reports that he has been smoking Cigarettes.  He has been smoking about 0.50 packs per day. He does not have any smokeless tobacco history on file. He reports that he does not drink alcohol or use drugs.  Allergies:  Allergies  Allergen Reactions  . Celecoxib     Other reaction(s): IRRITABILITY  . Clonazepam Itching  . Cyclobenzaprine     Other reaction(s): IRRITABILITY  . Dexamethasone     Other reaction(s): IRRITABILITY  . Diazepam     Other reaction(s): IRRITABILITY  . Etodolac     Other reaction(s): IRRITABILITY  . Gabapentin     Other reaction(s): IRRITABILITY  . Metaxalone     Other reaction(s): IRRITABILITY  . Methocarbamol     Other reaction(s): IRRITABILITY  . Pregabalin     Other reaction(s): IRRITABILITY  . Propoxyphene     Other reaction(s): IRRITABILITY  . Tramadol     Other reaction(s): IRRITABILITY     (Not in a hospital admission)  No results found for this or any previous visit (from the past 48 hour(s)). Dg Abd Acute W/chest  Result Date:  01/27/2016 CLINICAL DATA:  Abdominal pain and constipation for 1 week. EXAM: DG ABDOMEN ACUTE W/ 1V CHEST COMPARISON:  None. FINDINGS: Moderate free intra-abdominal air in the upper abdomen. Gasous distention of bowel loops with air-fluid levels. No radiopaque calculi. Low lung volumes. No focal airspace disease. Normal cardiomediastinal contours. Postsurgical change in the thoracic and lumbar spine. IMPRESSION: Free intra-abdominal air consistent with perforated viscus. Gaseous bowel distention with air-fluid levels. Critical Value/emergent results were called by telephone at the time of interpretation on 01/27/2016 at 5:54 am to Dr. Pryor Curia , who verbally acknowledged these results. Electronically Signed   By: Jeb Levering M.D.   On: 01/27/2016 05:55    Review of Systems  Constitutional: Positive for malaise/fatigue.  HENT: Negative.   Eyes: Negative.   Respiratory:       Feels like he cannot take a deep breath  Cardiovascular: Negative for chest pain.  Gastrointestinal: Positive for abdominal pain and constipation.  Genitourinary: Negative.   Musculoskeletal: Positive for back pain.  Skin: Negative.   Neurological: Negative.   Endo/Heme/Allergies: Negative.   Psychiatric/Behavioral: Negative.     Blood pressure 118/82, pulse 91, temperature 97.5 F (36.4 C), temperature source Oral, resp. rate 19, SpO2 100 %. Physical Exam  Constitutional: He is oriented to person, place, and time. He appears well-developed. He appears distressed.  HENT:  Head: Normocephalic.  Right Ear: External ear normal.  Left Ear: External ear normal.  Mouth/Throat: Oropharynx is clear and moist.  Eyes: EOM are normal. Pupils are equal, round, and reactive to light.  Neck: Neck supple. No tracheal deviation present.  Cardiovascular: Normal rate, normal heart sounds and intact distal pulses.   Respiratory: Effort normal and breath sounds normal. No respiratory distress. He has no wheezes. He has no  rales.  GI: Soft. He exhibits no distension. There is tenderness. There is rebound and guarding.  Diffuse peritonitis, worse on right  Musculoskeletal: He exhibits no edema.  Neurological: He is alert and oriented to person, place, and time. He exhibits normal muscle tone.  Skin: No rash noted.  Psychiatric: He has a normal mood and affect.     Assessment/Plan Abdominal pain with free air consistent with perforated viscus - Zosyn IV and plan emergent exploration this morning. I will discuss his case in detail with Dr. Redmond Pulling. I discussed the plan and possible postoperative course with him and his wife.  Zenovia Jarred, MD 01/27/2016, 6:24 AM

## 2016-01-27 NOTE — Transfer of Care (Signed)
Immediate Anesthesia Transfer of Care Note  Patient: [name]  Procedure(s) Performed: Procedure(s): EXPLORATORY LAPAROTOMY (N/A) SMALL BOWEL RESECTION (N/A)  Patient Location: PACU  Anesthesia Type:General  Level of Consciousness: awake  Airway & Oxygen Therapy: Patient Spontanous Breathing and Patient connected to nasal cannula oxygen  Post-op Assessment: Report given to RN  Post vital signs: Reviewed and stable  Last Vitals:  Vitals:   01/27/16 0915 01/27/16 1211  BP: 114/63   Pulse: 104   Resp: 21   Temp:  36.2 C    Last Pain:  Vitals:   01/27/16 0428  TempSrc:   PainSc: 10-Worst pain ever         Complications: No apparent anesthesia complications

## 2016-01-27 NOTE — Anesthesia Procedure Notes (Signed)
Procedure Name: Intubation Date/Time: 01/27/2016 10:15 AM Performed by: Sampson Si E Pre-anesthesia Checklist: Patient identified, Emergency Drugs available, Suction available and Patient being monitored Patient Re-evaluated:Patient Re-evaluated prior to inductionOxygen Delivery Method: Circle System Utilized Preoxygenation: Pre-oxygenation with 100% oxygen Intubation Type: IV induction Ventilation: Mask ventilation without difficulty Laryngoscope Size: Mac and 4 Grade View: Grade I Tube type: Oral Tube size: 7.5 mm Number of attempts: 2 Airway Equipment and Method: Stylet and Oral airway Placement Confirmation: ETT inserted through vocal cords under direct vision,  positive ETCO2 and breath sounds checked- equal and bilateral Secured at: 22 cm Tube secured with: Tape Dental Injury: Teeth and Oropharynx as per pre-operative assessment

## 2016-01-27 NOTE — ED Provider Notes (Signed)
TIME SEEN: 5:50 AM  CHIEF COMPLAINT: Abdominal pain  HPI: Pt is a 50 y.o. male with history of chronic back pain who is in pain management who presents to the emergency department with diffuse severe abdominal pain. Reports decreased bowel movements since October 2. Took magnesium citrate and had a large bowel movement on Saturday but has not had any since. Has had increasing pain that worsened tonight. Has had chills, nausea and vomiting. No history of abdominal surgeries.  ROS: See HPI Constitutional: no fever  Eyes: no drainage  ENT: no runny nose   Cardiovascular:  no chest pain  Resp: no SOB  GI:  vomiting GU: no dysuria Integumentary: no rash  Allergy: no hives  Musculoskeletal: no leg swelling  Neurological: no slurred speech ROS otherwise negative  PAST MEDICAL HISTORY/PAST SURGICAL HISTORY:  Past Medical History:  Diagnosis Date  . Asthma   . Pneumonia     MEDICATIONS:  Prior to Admission medications   Medication Sig Start Date End Date Taking? Authorizing Provider  ALPRAZolam Duanne Moron) 0.5 MG tablet Take 0.5 mg by mouth 3 (three) times daily as needed for anxiety.    Yes Historical Provider, MD  Diphenhydramine-APAP, sleep, (GOODY PM PO) Take 1 Package by mouth daily as needed (sleep/pain).   Yes Historical Provider, MD  oxymorphone (OPANA ER) 40 MG 12 hr tablet Take 40 mg by mouth 3 (three) times daily.   Yes Historical Provider, MD    ALLERGIES:  Allergies  Allergen Reactions  . Celecoxib     Other reaction(s): IRRITABILITY  . Clonazepam Itching  . Cyclobenzaprine     Other reaction(s): IRRITABILITY  . Dexamethasone     Other reaction(s): IRRITABILITY  . Diazepam     Other reaction(s): IRRITABILITY  . Etodolac     Other reaction(s): IRRITABILITY  . Gabapentin     Other reaction(s): IRRITABILITY  . Metaxalone     Other reaction(s): IRRITABILITY  . Methocarbamol     Other reaction(s): IRRITABILITY  . Pregabalin     Other reaction(s): IRRITABILITY  .  Propoxyphene     Other reaction(s): IRRITABILITY  . Tramadol     Other reaction(s): IRRITABILITY    SOCIAL HISTORY:  Social History  Substance Use Topics  . Smoking status: Current Every Day Smoker    Packs/day: 0.50    Types: Cigarettes  . Smokeless tobacco: Not on file  . Alcohol use No    FAMILY HISTORY: History reviewed. No pertinent family history.  EXAM: BP 119/89 (BP Location: Left Arm)   Pulse 91   Temp 97.5 F (36.4 C) (Oral)   Resp 24   SpO2 97%  CONSTITUTIONAL: Alert and oriented and responds appropriately to questions. Appears very uncomfortable, moaning in pain, diaphoretic, chronically ill-appearing, thin HEAD: Normocephalic EYES: Conjunctivae clear, PERRL ENT: normal nose; no rhinorrhea; moist mucous membranes NECK: Supple, no meningismus, no LAD  CARD: Regular and tachycardic; S1 and S2 appreciated; no murmurs, no clicks, no rubs, no gallops RESP: Normal chest excursion without splinting or tachypnea; breath sounds clear and equal bilaterally; no wheezes, no rhonchi, no rales, no hypoxia or respiratory distress, speaking full sentences ABD/GI: Normal bowel sounds; non-distended; abdomen is rigid, diffusely tender especially in the right side of the abdomen with guarding, patient does have peritoneal signs BACK:  The back appears normal and is non-tender to palpation, there is no CVA tenderness EXT: Normal ROM in all joints; non-tender to palpation; no edema; normal capillary refill; no cyanosis, no calf tenderness or swelling  SKIN: Normal color for age and race; warm; no rash NEURO: Moves all extremities equally  MEDICAL DECISION MAKING: Patient here with abdominal pain. X-ray was ordered while patient was in the waiting room and shows that he has a perforated viscus with free air. I saw patient immediately after these results came back and he was placed in a room. Surgery has been consult. Will obtain labs, cultures, lactate. We'll give IV fluids, pain and  nausea medicine.  ED PROGRESS: 6:00 AM  Dr. Grandville Silos With general surgery made aware patient immediately.  6:15 AM  Dr. Grandville Silos at bedside.   EKG Interpretation  Date/Time:  Friday January 27 2016 06:01:28 EDT Ventricular Rate:  108 PR Interval:    QRS Duration: 106 QT Interval:  375 QTC Calculation: 503 R Axis:   76 Text Interpretation:  Sinus tachycardia Ventricular premature complex Aberrant conduction of SV complex(es) Prolonged QT interval No significant change since last tracing Artifact Confirmed by Lawerance Matsuo,  DO, Harshan Kearley [id number]) on 01/27/2016 6:19:46 AM         CRITICAL CARE Performed by: Nyra Jabs   Total critical care time: 30 minutes  Critical care time was exclusive of separately billable procedures and treating other patients.  Critical care was necessary to treat or prevent imminent or life-threatening deterioration.  Critical care was time spent personally by me on the following activities: development of treatment plan with patient and/or surrogate as well as nursing, discussions with consultants, evaluation of patient's response to treatment, examination of patient, obtaining history from patient or surrogate, ordering and performing treatments and interventions, ordering and review of laboratory studies, ordering and review of radiographic studies, pulse oximetry and re-evaluation of patient's condition.     [place], DO 01/27/16 [phone]

## 2016-01-28 LAB — CBC
HEMATOCRIT: 35.2 % — AB (ref 39.0–52.0)
HEMOGLOBIN: 11.8 g/dL — AB (ref 13.0–17.0)
MCH: 27.2 pg (ref 26.0–34.0)
MCHC: 33.5 g/dL (ref 30.0–36.0)
MCV: 81.1 fL (ref 78.0–100.0)
Platelets: 250 10*3/uL (ref 150–400)
RBC: 4.34 MIL/uL (ref 4.22–5.81)
RDW: 14.7 % (ref 11.5–15.5)
WBC: 11.2 10*3/uL — AB (ref 4.0–10.5)

## 2016-01-28 LAB — BASIC METABOLIC PANEL
ANION GAP: 6 (ref 5–15)
BUN: 18 mg/dL (ref 6–20)
CALCIUM: 7.8 mg/dL — AB (ref 8.9–10.3)
CHLORIDE: 104 mmol/L (ref 101–111)
CO2: 28 mmol/L (ref 22–32)
Creatinine, Ser: 0.57 mg/dL — ABNORMAL LOW (ref 0.61–1.24)
GFR calc non Af Amer: >60 mL/min (ref >60)
GLUCOSE: 101 mg/dL — AB (ref 65–99)
POTASSIUM: 3.4 mmol/L — AB (ref 3.5–5.1)
Sodium: 138 mmol/L (ref 135–145)

## 2016-01-28 MED ORDER — FENTANYL 50 MCG/HR TD PT72
50.0000 ug | MEDICATED_PATCH | TRANSDERMAL | Status: DC
  Administered 2016-01-28 – 2016-01-31 (×3): 50 ug via TRANSDERMAL
  Filled 2016-01-28 (×3): qty 1

## 2016-01-28 MED ORDER — HYDROMORPHONE HCL 1 MG/ML IJ SOLN
1.0000 mg | Freq: Once | INTRAMUSCULAR | Status: AC
  Administered 2016-01-29: 1 mg via INTRAVENOUS
  Filled 2016-01-28: qty 1

## 2016-01-28 MED ORDER — HYDROMORPHONE 1 MG/ML IV SOLN
INTRAVENOUS | Status: DC
  Administered 2016-01-29: 3.6 mg via INTRAVENOUS
  Administered 2016-01-29: 16 mg via INTRAVENOUS
  Administered 2016-01-29: 03:00:00 via INTRAVENOUS
  Administered 2016-01-29: 1.5 mg via INTRAVENOUS
  Administered 2016-01-30: 9 mg via INTRAVENOUS
  Administered 2016-01-30: 2.1 mg via INTRAVENOUS
  Administered 2016-01-30: 11 mg via INTRAVENOUS
  Filled 2016-01-28 (×2): qty 25

## 2016-01-28 MED ORDER — DIPHENHYDRAMINE HCL 50 MG/ML IJ SOLN: 12.5000 mg | Freq: Four times a day (QID) | INTRAMUSCULAR | Status: DC | PRN

## 2016-01-28 MED ORDER — NALOXONE HCL 0.4 MG/ML IJ SOLN: 0.4000 mg | INTRAMUSCULAR | Status: DC | PRN

## 2016-01-28 MED ORDER — ONDANSETRON HCL 4 MG/2ML IJ SOLN: 4.0000 mg | Freq: Four times a day (QID) | INTRAMUSCULAR | Status: DC | PRN

## 2016-01-28 MED ORDER — DIPHENHYDRAMINE HCL 12.5 MG/5ML PO ELIX: 12.5000 mg | ORAL_SOLUTION | Freq: Four times a day (QID) | ORAL | Status: DC | PRN

## 2016-01-28 MED ORDER — SODIUM CHLORIDE 0.9% FLUSH: 9.0000 mL | INTRAVENOUS | Status: DC | PRN

## 2016-01-28 NOTE — Op Note (Signed)
NAMEVELTON, BOU NO.:  [id number]  MEDICAL RECORD NO.:  [id number]  LOCATION:  6N04C                        FACILITY:  [facility]  PHYSICIAN:  Leighton Ruff. Redmond Pulling, MD, FACSDATE OF BIRTH:  [birth year]  DATE OF PROCEDURE:  01/27/2016 DATE OF DISCHARGE:                              OPERATIVE REPORT   PREOPERATIVE DIAGNOSIS:  Perforated viscus.  POSTOPERATIVE DIAGNOSIS:  Distal small-bowel perforation.  PROCEDURES: 1. Exploratory laparotomy. 2. Ileocecectomy.  SURGEON:  Leighton Ruff. Redmond Pulling, MD, FACS.  ASSISTANT:  Edsel Petrin. Dalbert Batman, M.D. FACS  ANESTHESIA:  General.  EBL:  Minimal.  SPECIMEN:  Distal small bowel stitch marks.  INDICATIONS FOR PROCEDURE:  The patient is a 50 year old gentleman with chronic pain with multiple back surgeries.  He had had recent issues with constipation and took a magnesium citrate and had a large bowel movement.  Since then, he had had progressively worsening abdominal pain.  He has not been eating any food.  He came to the emergency room early this morning for worsening abdominal pain.  An acute abdominal series demonstrated moderate pneumoperitoneum, and he had diffuse peritonitis on exam.  It was recommended that he go emergently to the operating room for exploratory laparotomy.  He also reported a history of taking 2 Goody's powders per day chronically.  It was unclear the exact location of the perforation based on his history and physical exam.  I discussed at length the risks and benefits of the procedure including, but not limited to, bleeding, infection, abscess, injury to surrounding structures, need for bowel resection, possible colostomy, ileus, fistula formation, incisional hernia, blood clot formation, perioperative cardiac and pulmonary events, prolonged hospitalization, renal failure, potential death as well as a typical hospital course.  DESCRIPTION OF PROCEDURE:  He was taken emergently to operating room #1 at [facility], placed supine on the operating room table. General endotracheal anesthesia was established.  Sequential compression devices were placed.  A Foley catheter was placed.  His abdomen was prepped and draped in the usual standard surgical fashion with ChloraPrep.  He received Zosyn in the emergency room.  A surgical time- out was performed.  I elected to make first mini midline incision to see if I could then determine whether or not it was a colonic perforation or proximal ulcer perforation in the stomach due to his NSAID use.  A mini midline incision was made with a #10 blade.  The subcutaneous tissue was divided with electrocautery and the fascia was entered.  There was some release of air.  Dr. Fanny Skates had joined me at the operating room at this point.  There is some thin, yellow-tinged fluid, but no odor.  We palpated the left lower quadrant colon, the sigmoid colon, and could not appreciate any hard indurated mass in the colon.  Based on no odor and yellowish fluid, I thought it might be more consistent with perforated gastric ulcer, so we extended the excision up to the midline and divided the fascia.  The anterior stomach looked normal.  There was no staining in the retroperitoneum.  There were no gross enteric contents.  There was greenish fluid in the upper abdomen in the  left and right upper quadrants, which was evacuated.  The omentum was stuck to the left lower quadrant to the sigmoid colon and left lower quadrant abdominal wall. Since it was not apparent that he had a perforated gastric/duodenal ulcer, we thought he may have a small sigmoid diverticular perforation.  We eviscerated the abdomen once we got the omentum taken down from the left lower quadrant abdominal wall.  He had diffuse small bowel dilatation and he had some induration in his distal small bowel mesentery, which looked initially to be reactive to the process going on his pelvis because there  was thicker green fluid in the pelvis.  We took down the omentum and separated from the sigmoid colon.  The colon appeared normal.  There was no induration in the colon.  There was no wall thickening of the colon and no defect in the colon.  We reinspected the terminal ileum and then ran the bowel back proximally.  This area of the small bowel was very thickened and the small bowel mesentery was very indurated.  We came across a small perforation in the distal ileum.  The perforation was about 4 mm in size.  Based on this, it felt like the perforation was at this location.  We reinspected the upper stomach, the transverse colon, splenic flexure, hepatic flexure, and all that anatomy looked normal.  At this point, we were confident that the perforation was in this location of the distal ileum.  When we squeezed it, enteric contents came out.  Based on the fact that remaining TI was indurated, I felt that he would be best served by doing an ileocecectomy.  The terminal ileum and cecum were adhered to the peritoneum and it took some mobilization on that with right angle and Metz and electrocautery, but we were able to lift the cecum up and take down the white line of [place] and mobilize the right colon up out of the pericolic gutter.  The appendix was mobilized as well.  At this point, I had achieved adequate mobilization to accommodate resection.  We went back to more normal small bowel, where it as indurated and this was probably about 15 cm proximal to the area of perforation.  A small rent was made in the mesentery just next to the small bowel wall and it was then divided with a GIA 75 stapler with a blue load.  I then divided the colon just above the cecum in a similar fashion with another firing of the GIA 75 stapler with a blue load.  The mesentery was taken down in sequential fashion with LigaSure device staying very close to the bowel wall.  The ileocolonic vascular pedicle was then  ligated with 2 interrupted figure- of-eight silk sutures.  The small bowel was then brought in a side-to- side fashion with the ascending colon and lined up.  A 3-0 silk suture was placed along each staple line to help with alignment.  We ensured that the mesentery was not twisted.  A small enterotomy was made just in front of the staple line in the small bowel side and then front of the colon.  One limb of a GIA 75 stapler was placed through each enterotomy and the staple was brought together to create a common channel to create a side-to-side anastomosis between the ileum and the ascending colon. The common defect was then brought together with Allis clamps and then closed with a single firing of a TA 60 stapler with a green load.  I then imbricated the common staple line with interrupted 3-0 silk sutures.  A crotch suture was placed in the anastomosis with a 3-0 silk suture.  The mesentery defect was then closed with interrupted 2-0 silk sutures, both in an interrupted fashion as well with figure-of-eight fashion.  At this point, we copiously irrigated the abdomen with 4 L of saline.  We then confirmed nasogastric tube placement in the stomach. At this point, I then closed the abdomen with a #1 looped PDS, 1 from above and 1 from below to the intraabdominal contamination.  His skin was left open with moist gauze.  Followed by dry dressing.  We did take intraabdominal cultures at the beginning of the case.  All needle, instrument, and sponge counts were correct x2. There were no immediate complications.  The patient tolerated the procedure well.  He was taken to the recovery room.     Leighton Ruff. Redmond Pulling, MD, FACS     EMW/MEDQ  D:  01/27/2016  T:  01/28/2016  Job:  [id number]

## 2016-01-28 NOTE — Progress Notes (Signed)
  Progress Note: General Surgery Service   Subjective: Pain issues, bed uncomfortable, no nausea or vomiting  Objective: Vital signs in last 24 hours: Temp:  [97.3 F (36.3 C)-99.4 F (37.4 C)] 98.3 F (36.8 C) (10/14 0619) Pulse Rate:  [83-103] 96 (10/14 0619) Resp:  [16-28] 18 (10/14 0737) BP: (114-143)/(66-77) 114/67 (10/14 0619) SpO2:  [93 %-100 %] 94 % (10/14 0737) Weight:  [71.9 kg (158 lb 8.2 oz)] 71.9 kg (158 lb 8.2 oz) (10/13 1510)    Intake/Output from previous day: 10/13 0701 - 10/14 0700 In: 4352.5 [I.V.:3182.5; NG/GT:20; IV Piggyback:1150] Out: 1075 [Urine:925; Emesis/NG output:50; Blood:100] Intake/Output this shift: No intake/output data recorded.  Lungs: CTAb  Cardiovascular: RRR  Abd: soft, ATTP, no guarding, wound open clean base  Extremities: no edema, thin  Neuro: AOx4  Lab Results: CBC   Recent Labs  01/27/16 0435 01/28/16 0758  WBC 7.1 11.2*  HGB 14.0 11.8*  HCT 42.0 35.2*  PLT 249 250   BMET  Recent Labs  01/27/16 0435 01/28/16 0758  NA 136 138  K 3.7 3.4*  CL 98* 104  CO2 25 28  GLUCOSE 97 101*  BUN 18 18  CREATININE 0.95 0.57*  CALCIUM 8.4* 7.8*   PT/INR No results for input(s): LABPROT, INR in the last 72 hours. ABG No results for input(s): PHART, HCO3 in the last 72 hours.  Invalid input(s): PCO2, PO2  Studies/Results:  Anti-infectives: Anti-infectives    Start     Dose/Rate Route Frequency Ordered Stop   01/27/16 0630  piperacillin-tazobactam (ZOSYN) IVPB 3.375 g     3.375 g 12.5 mL/hr over 240 Minutes Intravenous Every 8 hours 01/27/16 0624        Medications: Scheduled Meds: . enoxaparin (LOVENOX) injection  40 mg Subcutaneous Q24H  . famotidine (PEPCID) IV  20 mg Intravenous Q12H  . fentaNYL  50 mcg Transdermal Q72H  . mouth rinse  15 mL Mouth Rinse BID  . morphine   Intravenous Q4H  . piperacillin-tazobactam (ZOSYN)  IV  3.375 g Intravenous Q8H   Continuous Infusions: . dextrose 5 % and 0.45 %  NaCl with KCl 20 mEq/L 1 mL (01/28/16 0041)   PRN Meds:.diphenhydrAMINE **OR** diphenhydrAMINE, fentaNYL (SUBLIMAZE) injection, naloxone **AND** sodium chloride flush, ondansetron (ZOFRAN) IV  Assessment/Plan: Patient Active Problem List   Diagnosis Date Noted  . Small bowel perforation ([place]) 01/27/2016   s/p Procedure(s): EXPLORATORY LAPAROTOMY SMALL BOWEL RESECTION 01/27/2016 -fentanyl patch to help with opioid tolerance -continue pca -continue NG tube today -OOB   LOS: 1 day   Mickeal Skinner, MD Pg# [phone] [facility]

## 2016-01-29 LAB — BASIC METABOLIC PANEL
Anion gap: 6 (ref 5–15)
BUN: 15 mg/dL (ref 6–20)
CHLORIDE: 104 mmol/L (ref 101–111)
CO2: 26 mmol/L (ref 22–32)
CREATININE: 0.54 mg/dL — AB (ref 0.61–1.24)
Calcium: 7.5 mg/dL — ABNORMAL LOW (ref 8.9–10.3)
GFR calc Af Amer: >60 mL/min (ref >60)
GFR calc non Af Amer: >60 mL/min (ref >60)
GLUCOSE: 115 mg/dL — AB (ref 65–99)
POTASSIUM: 3.5 mmol/L (ref 3.5–5.1)
SODIUM: 136 mmol/L (ref 135–145)

## 2016-01-29 LAB — CBC
HCT: 30.9 % — ABNORMAL LOW (ref 39.0–52.0)
HEMOGLOBIN: 10.3 g/dL — AB (ref 13.0–17.0)
MCH: 27.2 pg (ref 26.0–34.0)
MCHC: 33.3 g/dL (ref 30.0–36.0)
MCV: 81.5 fL (ref 78.0–100.0)
Platelets: 230 10*3/uL (ref 150–400)
RBC: 3.79 MIL/uL — ABNORMAL LOW (ref 4.22–5.81)
RDW: 15 % (ref 11.5–15.5)
WBC: 7.8 10*3/uL (ref 4.0–10.5)

## 2016-01-29 MED ORDER — DEXTROSE 5 % IV SOLN
2.0000 g | INTRAVENOUS | Status: DC
  Administered 2016-01-29 – 2016-02-01 (×4): 2 g via INTRAVENOUS
  Filled 2016-01-29 (×7): qty 2

## 2016-01-29 NOTE — Progress Notes (Signed)
Removed foley cath  0800 no void, bladder scan 86 ml. Paged MD Rosendo Gros, said to replace foley cath.

## 2016-01-29 NOTE — Progress Notes (Signed)
2 Days Post-Op  Subjective: PT doing well this AM No bowel function  Objective: Vital signs in last 24 hours: Temp:  [98.2 F (36.8 C)-98.4 F (36.9 C)] 98.4 F (36.9 C) (10/15 0521) Pulse Rate:  [74-95] 74 (10/15 0521) Resp:  [18-26] 21 (10/15 0800) BP: (115-126)/(70-83) 115/70 (10/15 0521) SpO2:  [92 %-96 %] 94 % (10/15 0800)    Intake/Output from previous day: 10/14 0701 - 10/15 0700 In: 2025 [I.V.:1875; IV Piggyback:150] Out: 375 [Urine:275; Emesis/NG output:100] Intake/Output this shift: Total I/O In: -  Out: 100 [Urine:100]  General appearance: alert and cooperative GI: soft, approp ttp, ND, dsg c/d/i  Lab Results:   Recent Labs  01/28/16 0758 01/29/16 0538  WBC 11.2* 7.8  HGB 11.8* 10.3*  HCT 35.2* 30.9*  PLT 250 230   BMET  Recent Labs  01/28/16 0758 01/29/16 0538  NA 138 136  K 3.4* 3.5  CL 104 104  CO2 28 26  GLUCOSE 101* 115*  BUN 18 15  CREATININE 0.57* 0.54*  CALCIUM 7.8* 7.5*   PT/INR No results for input(s): LABPROT, INR in the last 72 hours. ABG No results for input(s): PHART, HCO3 in the last 72 hours.  Invalid input(s): PCO2, PO2  Studies/Results: No results found.  Anti-infectives: Anti-infectives    Start     Dose/Rate Route Frequency Ordered Stop   01/27/16 0630  piperacillin-tazobactam (ZOSYN) IVPB 3.375 g     3.375 g 12.5 mL/hr over 240 Minutes Intravenous Every 8 hours 01/27/16 0624        Assessment/Plan: s/p Procedure(s): EXPLORATORY LAPAROTOMY (N/A) SMALL BOWEL RESECTION (N/A) -fentanyl patch to help with opioid tolerance -continue pca -continue NG tube today -mobilize as tol   LOS: 2 days    Rosario Jacks., [facility] 01/29/2016

## 2016-01-30 ENCOUNTER — Encounter (HOSPITAL_COMMUNITY): Payer: Self-pay | Admitting: General Surgery

## 2016-01-30 LAB — AEROBIC/ANAEROBIC CULTURE W GRAM STAIN (SURGICAL/DEEP WOUND)

## 2016-01-30 LAB — AEROBIC/ANAEROBIC CULTURE (SURGICAL/DEEP WOUND)

## 2016-01-30 MED ORDER — KETOROLAC TROMETHAMINE 15 MG/ML IJ SOLN
15.0000 mg | Freq: Three times a day (TID) | INTRAMUSCULAR | Status: AC
  Administered 2016-01-30 (×3): 15 mg via INTRAVENOUS
  Filled 2016-01-30 (×3): qty 1

## 2016-01-30 MED ORDER — HYDROMORPHONE 1 MG/ML IV SOLN
INTRAVENOUS | Status: DC
  Administered 2016-01-30: 5.52 mg via INTRAVENOUS
  Administered 2016-01-30: 12:00:00 via INTRAVENOUS
  Administered 2016-01-30: 5.71 mg via INTRAVENOUS
  Administered 2016-01-30 (×2): 0.5 mg via INTRAVENOUS
  Administered 2016-01-30: 15 mg via INTRAVENOUS
  Administered 2016-01-30: 0.5 mg via INTRAVENOUS
  Administered 2016-01-31: 17:00:00 via INTRAVENOUS
  Administered 2016-01-31: 3 mg via INTRAVENOUS
  Administered 2016-01-31: 4.2 mg via INTRAVENOUS
  Administered 2016-01-31: 7.21 mg via INTRAVENOUS
  Administered 2016-01-31: 1.77 mg via INTRAVENOUS
  Administered 2016-01-31: 5 mg via INTRAVENOUS
  Administered 2016-02-01: 4.69 mg via INTRAVENOUS
  Administered 2016-02-01: 3.55 mg via INTRAVENOUS
  Administered 2016-02-01: 2.29 mg via INTRAVENOUS
  Administered 2016-02-01: 3.4 mg via INTRAVENOUS
  Administered 2016-02-01: 3.27 mg via INTRAVENOUS
  Administered 2016-02-01: 1.61 mg via INTRAVENOUS
  Administered 2016-02-02: 3.34 mg via INTRAVENOUS
  Administered 2016-02-02: 2.58 mg via INTRAVENOUS
  Administered 2016-02-02: 2.82 mg via INTRAVENOUS
  Administered 2016-02-02: 4.31 mg via INTRAVENOUS
  Filled 2016-01-30 (×3): qty 25

## 2016-01-30 NOTE — Anesthesia Postprocedure Evaluation (Signed)
Anesthesia Post Note  Patient: [name]  Procedure(s) Performed: Procedure(s) (LRB): EXPLORATORY LAPAROTOMY (N/A) SMALL BOWEL RESECTION (N/A)  Patient location during evaluation: PACU Anesthesia Type: General Level of consciousness: awake and alert and patient cooperative Pain management: pain level controlled Vital Signs Assessment: post-procedure vital signs reviewed and stable Respiratory status: spontaneous breathing and respiratory function stable Cardiovascular status: stable Anesthetic complications: no    Last Vitals:  Vitals:   01/30/16 0735 01/30/16 0736  BP:    Pulse:    Resp: 14 14  Temp:      Last Pain:  Vitals:   01/30/16 0736  TempSrc:   PainSc: 10-Worst pain ever                 Chace Bisch S

## 2016-01-30 NOTE — Progress Notes (Signed)
Patient ID: [name], male   DOB: 1965-07-30, 50 y.o.   MRN: [id number]  [facility] Surgery Progress Note  3 Days Post-Op  Subjective: Patient continues to complain of severe pain, mostly in his back and not so much abdominal pain. Passing flatus. States that his throat is very dry due to NG tube.   Objective: Vital signs in last 24 hours: Temp:  [98.6 F (37 C)-98.8 F (37.1 C)] 98.7 F (37.1 C) (10/16 0459) Pulse Rate:  [77-82] 82 (10/16 0459) Resp:  [14-23] 14 (10/16 0736) BP: (117-135)/(73-78) 135/73 (10/16 0459) SpO2:  [92 %-97 %] 94 % (10/16 0736)    Intake/Output from previous day: 10/15 0701 - 10/16 0700 In: 2579.2 [I.V.:2429.2; NG/GT:50; IV Piggyback:100] Out: 1350 [Urine:950; Emesis/NG output:400] Intake/Output this shift: Total I/O In: 30 [NG/GT:30] Out: 100 [Emesis/NG output:100]  PE: Gen:  Alert, NAD, pleasant Abd: Soft, ND, +BS, open midline incisions C/D/I,   NGT 400/24hr  Lab Results:   Recent Labs  01/28/16 0758 01/29/16 0538  WBC 11.2* 7.8  HGB 11.8* 10.3*  HCT 35.2* 30.9*  PLT 250 230   BMET  Recent Labs  01/28/16 0758 01/29/16 0538  NA 138 136  K 3.4* 3.5  CL 104 104  CO2 28 26  GLUCOSE 101* 115*  BUN 18 15  CREATININE 0.57* 0.54*  CALCIUM 7.8* 7.5*   PT/INR No results for input(s): LABPROT, INR in the last 72 hours. CMP     Component Value Date/Time   NA 136 01/29/2016 0538   K 3.5 01/29/2016 0538   CL 104 01/29/2016 0538   CO2 26 01/29/2016 0538   GLUCOSE 115 (H) 01/29/2016 0538   BUN 15 01/29/2016 0538   CREATININE 0.54 (L) 01/29/2016 0538   CALCIUM 7.5 (L) 01/29/2016 0538   PROT 6.5 01/27/2016 0435   ALBUMIN 3.1 (L) 01/27/2016 0435   AST 23 01/27/2016 0435   ALT 12 (L) 01/27/2016 0435   ALKPHOS 83 01/27/2016 0435   BILITOT 0.6 01/27/2016 0435   GFRNONAA >60 01/29/2016 0538   GFRAA >60 01/29/2016 0538   Lipase     Component Value Date/Time   LIPASE 15 01/27/2016 0435        Studies/Results: No results found.  Anti-infectives: Anti-infectives    Start     Dose/Rate Route Frequency Ordered Stop   01/29/16 2200  cefTRIAXone (ROCEPHIN) 2 g in dextrose 5 % 50 mL IVPB     2 g 100 mL/hr over 30 Minutes Intravenous Every 24 hours 01/29/16 1419     01/27/16 0630  piperacillin-tazobactam (ZOSYN) IVPB 3.375 g     3.375 g 12.5 mL/hr over 240 Minutes Intravenous Every 8 hours 01/27/16 0624 01/29/16 1800       Assessment/Plan s/p Procedure(s): EXPLORATORY LAPAROTOMY (N/A) SMALL BOWEL RESECTION (N/A) 01/27/16 Dr. Redmond Pulling - POD 3 - passing flatus - patient with chronic pain, take narcotics at home, having difficulty with pain control postoperatively - abdominal pain is not severe, more of his pain is related to chronic pain in back  Chronic pain following multiple back surgeries  ID - rocephin 10/15>> FEN - NPO except sips/chips, IVF VTE - lovenox  Plan - NG clamping trial, give sips of clears. If tolerates trial can pull NG later today and advance to clears. Will add basal infusion of dilaudid, continue dilaudid PCA, continue IV fentantyl, add toradol for pain control.    LOS: 3 days    Jerrye Beavers , [facility] 01/30/2016, 11:12  AM Pager: [phone] Consults: [phone] Mon-Fri 7:00 am-4:30 pm Sat-Sun 7:00 am-11:30 am  Agree with above. He takes 40 mg of oxymorphine TID for chronic back pain.  He's had 3 back operations - the last around 2010 by Dr. Koleen Distance.  He is chronic disability since around 2008.  It will be hard to control his pain in the face of his chronic narcotic use.  In fact, it may have had something to do with an unusual location of perforation.  Alphonsa Overall, MD, [facility] Surgery Pager: [phone] Office phone:  [phone]

## 2016-01-30 NOTE — Progress Notes (Signed)
Pt doing well post NG tube removal, paged PA miller to advance diet she said okay to place him on clear liquid diet.

## 2016-01-30 NOTE — Care Management (Signed)
[name] Workers Comp [employer] information [employer] manager called.  Bo Merino phone [phone], fax [phone]  [place] [phone]

## 2016-01-31 MED ORDER — KETOROLAC TROMETHAMINE 15 MG/ML IJ SOLN
15.0000 mg | Freq: Three times a day (TID) | INTRAMUSCULAR | Status: DC
  Administered 2016-01-31 – 2016-02-02 (×7): 15 mg via INTRAVENOUS
  Filled 2016-01-31 (×8): qty 1

## 2016-01-31 NOTE — Progress Notes (Signed)
Patient ID: [name], male   DOB: 27-Jun-1965, 50 y.o.   MRN: [id number]  [facility] Surgery Progress Note  4 Days Post-Op  Subjective: Very agitated this morning. States that pain medication alterations helped yesterday, but today he is again in severe pain. Mainly complaining of back pain, but also some abdominal pain. Tolerating clears. Passing flatus, no BM.  Objective: Vital signs in last 24 hours: Temp:  [98 F (36.7 C)-98.6 F (37 C)] 98 F (36.7 C) (10/17 0504) Pulse Rate:  [78-83] 83 (10/17 0504) Resp:  [17-24] 24 (10/17 0800) BP: (111-122)/(67-77) 111/67 (10/17 0504) SpO2:  [92 %-98 %] 97 % (10/17 0800)    Intake/Output from previous day: 10/16 0701 - 10/17 0700 In: 3410 [P.O.:480; I.V.:2750; NG/GT:30; IV Piggyback:150] Out: 850 [Urine:750; Emesis/NG output:100] Intake/Output this shift: No intake/output data recorded.  PE: Gen:  Alert, NAD, agitated Pulm: effort normal Abd: Soft, ND, +BS, open midline incision covered with clean/dry dressing (just changed earlier this morning)   Lab Results:   Recent Labs  01/29/16 0538  WBC 7.8  HGB 10.3*  HCT 30.9*  PLT 230   BMET  Recent Labs  01/29/16 0538  NA 136  K 3.5  CL 104  CO2 26  GLUCOSE 115*  BUN 15  CREATININE 0.54*  CALCIUM 7.5*   PT/INR No results for input(s): LABPROT, INR in the last 72 hours. CMP     Component Value Date/Time   NA 136 01/29/2016 0538   K 3.5 01/29/2016 0538   CL 104 01/29/2016 0538   CO2 26 01/29/2016 0538   GLUCOSE 115 (H) 01/29/2016 0538   BUN 15 01/29/2016 0538   CREATININE 0.54 (L) 01/29/2016 0538   CALCIUM 7.5 (L) 01/29/2016 0538   PROT 6.5 01/27/2016 0435   ALBUMIN 3.1 (L) 01/27/2016 0435   AST 23 01/27/2016 0435   ALT 12 (L) 01/27/2016 0435   ALKPHOS 83 01/27/2016 0435   BILITOT 0.6 01/27/2016 0435   GFRNONAA >60 01/29/2016 0538   GFRAA >60 01/29/2016 0538   Lipase     Component Value Date/Time   LIPASE 15 01/27/2016 0435        Studies/Results: No results found.  Anti-infectives: Anti-infectives    Start     Dose/Rate Route Frequency Ordered Stop   01/29/16 2200  cefTRIAXone (ROCEPHIN) 2 g in dextrose 5 % 50 mL IVPB     2 g 100 mL/hr over 30 Minutes Intravenous Every 24 hours 01/29/16 1419     01/27/16 0630  piperacillin-tazobactam (ZOSYN) IVPB 3.375 g     3.375 g 12.5 mL/hr over 240 Minutes Intravenous Every 8 hours 01/27/16 0624 01/29/16 1800       Assessment/Plan s/p Procedure(s): EXPLORATORY LAPAROTOMY (N/A) SMALL BOWEL RESECTION (N/A) 01/27/16 Dr. Redmond Pulling - POD 4 - postop pain control difficult due to use of high dose narcotic use at home (40mg  oxymorphine TID) - back pain was better yesterday after adding basal infusion dilaudid and continuing dilaudid PCA as well as IV fentantyl. Will place on toradol x5 days.  Chronic pain following multiple back surgeries  ID - rocephin 10/15>> FEN - full liquids, IVF VTE - lovenox  Plan - continue pain medication as before (basal infusion of dilaudid, dilaudid PCA, IV fentantyl), will also put back on toradol for additional 4 days.   Continue to encourage mobilization.   Advance to full liquids.   Unable to d/c foley POD1, will keep today and likely try d/c tomorrow.   LOS: 4 days  Jerrye Beavers , [facility] 01/31/2016, 9:31 AM Pager: [phone] Consults: [phone] Mon-Fri 7:00 am-4:30 pm Sat-Sun 7:00 am-11:30 am  Agree with above. Difficult patient to deal with. Chronic pain issues cloud his management. Still has foley - out tomorrow Needs to walk more - has used walker - but not doing much.  Alphonsa Overall, MD, [facility] Surgery Pager: [phone] Office phone:  [phone]

## 2016-02-01 LAB — CULTURE, BLOOD (ROUTINE X 2)
CULTURE: NO GROWTH
Culture: NO GROWTH

## 2016-02-01 LAB — GLUCOSE, CAPILLARY: GLUCOSE-CAPILLARY: 86 mg/dL (ref 65–99)

## 2016-02-01 MED ORDER — ALPRAZOLAM 0.5 MG PO TABS
0.5000 mg | ORAL_TABLET | Freq: Three times a day (TID) | ORAL | Status: DC | PRN
  Administered 2016-02-01: 0.5 mg via ORAL
  Filled 2016-02-01: qty 1

## 2016-02-01 NOTE — Care Management Note (Signed)
Case Management Note  Patient Details  Name: [name] MRN: [id number] Date of Birth: 10/04/1965  Subjective/Objective:                    Action/Plan:  [name] Workers Comp case manager Bo Merino phone [phone], fax [phone] visited patient, wife and CM today. CM updated Ms Randel Pigg and gave her home health RN and PT orders. Ms Randel Pigg will arrange home health and notify patient, his wife and CM on which agency that will be providing services. Expected Discharge Date:                  Expected Discharge Plan:  [place]  In-House Referral:     Discharge planning Services  CM Consult  Post Acute Care Choice:  Home Health Choice offered to:  Patient  DME Arranged:    DME Agency:     HH Arranged:  PT [place]:  [place]  Status of Service:  In process, will continue to follow  If discussed at Long Length of Stay Meetings, dates discussed:    Additional Comments:  Marilu Favre, RN 02/01/2016, 11:02 AM

## 2016-02-01 NOTE — Progress Notes (Signed)
Patient ID:  NAVRATIL, male   DOB: 1965-04-27, 50 y.o.   MRN: [id number]  [facility] Surgery Progress Note  5 Days Post-Op  Subjective: Angry and wants to go home. States he can't sleep in the hospital because of all the beeping and this is making his pain worse. Tolerating diet but not eating much because it is not appetizing. Passing gas, no Bm. Ambulated with walker a little yesterday.   Objective: Vital signs in last 24 hours: Temp:  [98 F (36.7 C)-98.6 F (37 C)] 98.1 F (36.7 C) (10/18 0541) Pulse Rate:  [74-83] 74 (10/18 0541) Resp:  [15-24] 17 (10/18 0741) BP: (108-115)/(63-70) 110/66 (10/18 0541) SpO2:  [95 %-100 %] 100 % (10/18 0741) FiO2 (%):  [98 %] 98 % (10/18 0324) Last BM Date: 01/27/16  Intake/Output from previous day: 10/17 0701 - 10/18 0700 In: 4550 [P.O.:1200; I.V.:3250; IV Piggyback:100] Out: 2250 [Urine:2250] Intake/Output this shift: Total I/O In: 0  Out: 450 [Urine:450]  PE: Gen: Alert, NAD, agitated Pulm: effort normal Abd: Soft, ND, +BS, open midlineincision pink with no purulent drainage  Lab Results:  No results for input(s): WBC, HGB, HCT, PLT in the last 72 hours. BMET No results for input(s): NA, K, CL, CO2, GLUCOSE, BUN, CREATININE, CALCIUM in the last 72 hours. PT/INR No results for input(s): LABPROT, INR in the last 72 hours. CMP     Component Value Date/Time   NA 136 01/29/2016 0538   K 3.5 01/29/2016 0538   CL 104 01/29/2016 0538   CO2 26 01/29/2016 0538   GLUCOSE 115 (H) 01/29/2016 0538   BUN 15 01/29/2016 0538   CREATININE 0.54 (L) 01/29/2016 0538   CALCIUM 7.5 (L) 01/29/2016 0538   PROT 6.5 01/27/2016 0435   ALBUMIN 3.1 (L) 01/27/2016 0435   AST 23 01/27/2016 0435   ALT 12 (L) 01/27/2016 0435   ALKPHOS 83 01/27/2016 0435   BILITOT 0.6 01/27/2016 0435   GFRNONAA >60 01/29/2016 0538   GFRAA >60 01/29/2016 0538   Lipase     Component Value Date/Time   LIPASE 15 01/27/2016 0435        Studies/Results: No results found.  Anti-infectives: Anti-infectives    Start     Dose/Rate Route Frequency Ordered Stop   01/29/16 2200  cefTRIAXone (ROCEPHIN) 2 g in dextrose 5 % 50 mL IVPB     2 g 100 mL/hr over 30 Minutes Intravenous Every 24 hours 01/29/16 1419     01/27/16 0630  piperacillin-tazobactam (ZOSYN) IVPB 3.375 g     3.375 g 12.5 mL/hr over 240 Minutes Intravenous Every 8 hours 01/27/16 0624 01/29/16 1800       Assessment/Plan s/p Procedure(s): EXPLORATORY LAPAROTOMY (N/A) SMALL BOWEL RESECTION (N/A)01/27/16 Dr. Redmond Pulling - POD 5 - chronic pain issues making postop pain control difficult - passing gas, no BM  Chronic pain following multiple back surgeries  ID - rocephin 10/15>> FEN - soft, IVF VTE - lovenox  Plan - continue same pain medication regimen. Continue to encourage mobilization, PT consulted. Advance to soft diet. D/c foley.  HH requested for assistance with wound care.   LOS: 5 days    Jerrye Beavers , [facility] Surgery 02/01/2016, 9:45 AM Pager: [phone] Consults: [phone] Mon-Fri 7:00 am-4:30 pm Sat-Sun 7:00 am-11:30 am  Agree with above. On soft diet. Had BM. Abdominal wound okay. Complains of swelling in legs.  Probably from immobility, fluid, and nutrition (or lack thereof).  Probably home in AM.  He appears ready.  Alphonsa Overall, MD, [facility] Surgery Pager: [phone] Office phone:  [phone]

## 2016-02-01 NOTE — Care Management Important Message (Signed)
Important Message  Patient Details  Name: [name] MRN: [id number] Date of Birth: June 12, 1965   Medicare Important Message Given:  Yes    Jaimarie Rapozo 02/01/2016, 10:07 AM

## 2016-02-01 NOTE — Evaluation (Signed)
Physical Therapy Evaluation Patient Details Name: [name] MRN: [id number] DOB: [birth year] Today's Date: 02/01/2016   History of Present Illness  Patient is a 50 y/o male with hx of asthma and PNA presents with abdominal pain s/p EXPLORATORY LAPAROTOMY and small bowel resection.   Clinical Impression  Patient presents with pain and post surgical deficits s/p above. Pt very upset and irritated about pain management and control. Tolerated gait training with Min guard assist for safety. Education re: importance of mobility, bracing etc. Pt plans to discharge home with wife. Will follow acutely to maximize independence and mobility prior to return home. Will plan for stair training tomorrow.     Follow Up Recommendations Home health PT;Supervision for mobility/OOB    Equipment Recommendations  Rolling walker with 5" wheels;3in1 (PT)    Recommendations for Other Services       Precautions / Restrictions Precautions Precautions: Fall Restrictions Weight Bearing Restrictions: No      Mobility  Bed Mobility               General bed mobility comments: Up in chair upon PT arrival.   Transfers Overall transfer level: Needs assistance Equipment used: Rolling walker (2 wheeled) Transfers: Sit to/from Stand Sit to Stand: Min guard         General transfer comment: Min guard for safety. Stood from Youth worker.   Ambulation/Gait Ambulation/Gait assistance: Min guard Ambulation Distance (Feet): 75 Feet Assistive device: Rolling walker (2 wheeled) Gait Pattern/deviations: Step-through pattern;Decreased stride length;Trunk flexed Gait velocity: decreased Gait velocity interpretation: Below normal speed for age/gender General Gait Details: Slow, unsteady gait with increased knee/hip flexion. DOE. Fatigues.   Stairs            Wheelchair Mobility    Modified Rankin (Stroke Patients Only)       Balance Overall balance assessment: Needs  assistance Sitting-balance support: Feet supported;No upper extremity supported Sitting balance-Leahy Scale: Fair Sitting balance - Comments: use of UEs as position of comfort.   Standing balance support: During functional activity Standing balance-Leahy Scale: Fair Standing balance comment: Requires at least 1 UE support.                              Pertinent Vitals/Pain Pain Assessment: Faces Faces Pain Scale: Hurts whole lot Pain Location: back and abdomen Pain Descriptors / Indicators: Sore;Operative site guarding Pain Intervention(s): Monitored during session;Repositioned;PCA encouraged;Limited activity within patient's tolerance    Home Living Family/patient expects to be discharged to:: Private residence Living Arrangements: Spouse/significant other Available Help at Discharge: Family Type of Home: House Home Access: Ramped entrance     Home Layout: One level Home Equipment: Other (comment) (walking stick)      Prior Function Level of Independence: Independent         Comments: Has a walking stick but has not used it in awhile, "I don't bring it with me because I might hit people with it."  Sleeps in a recliner at home.     Hand Dominance        Extremity/Trunk Assessment   Upper Extremity Assessment: Defer to OT evaluation           Lower Extremity Assessment: Generalized weakness      Cervical / Trunk Assessment: Kyphotic;Other exceptions  Communication   Communication: No difficulties  Cognition Arousal/Alertness: Awake/alert Behavior During Therapy:  (irritated. ) Overall Cognitive Status: Within Functional Limits for tasks assessed  General Comments General comments (skin integrity, edema, etc.): Wife present during session. Patient very irritated about pain control and medications.    Exercises     Assessment/Plan    PT Assessment Patient needs continued PT services  PT Problem List Decreased  strength;Decreased mobility;Decreased activity tolerance;Decreased balance;Pain;Decreased knowledge of use of DME;Cardiopulmonary status limiting activity          PT Treatment Interventions DME instruction;Therapeutic activities;Therapeutic exercise;Gait training;Stair training;Balance training;Patient/family education;Functional mobility training    PT Goals (Current goals can be found in the Care Plan section)  Acute Rehab PT Goals Patient Stated Goal: to go home now PT Goal Formulation: With patient Time For Goal Achievement: 02/15/16 Potential to Achieve Goals: Good    Frequency Min 4X/week   Barriers to discharge        Co-evaluation               End of Session Equipment Utilized During Treatment: Gait belt Activity Tolerance: Patient tolerated treatment well;Patient limited by pain Patient left: in chair;with call bell/phone within reach;with family/visitor present Nurse Communication: Mobility status         Time: [id number] PT Time Calculation (min) (ACUTE ONLY): 24 min   Charges:   PT Evaluation $PT Eval Moderate Complexity: 1 Procedure PT Treatments $Gait Training: 8-22 mins   PT G Codes:        Kendel Bessey A Torah Pinnock 02/01/2016, 1:54 PM Wray Kearns, [place], DPT [phone]

## 2016-02-02 MED ORDER — OXYCODONE HCL 5 MG PO TABS
5.0000 mg | ORAL_TABLET | Freq: Once | ORAL | Status: AC
  Administered 2016-02-02: 10 mg via ORAL
  Filled 2016-02-02: qty 2

## 2016-02-02 NOTE — Progress Notes (Signed)
D/c instructions reviewed with pt and wife. Pt's wife  has seen demonstrations of wound care, able to teach back wound care. Supplies sent with pt for wound care tonight and tomorrow until [facility] comes out to home tomorrow per wife and will bring further supplies and assist with wound care as well. Copy of instructions given to pt. Pt d/c'd via wheelchair with belongings with wife, escorted by hospital volunteer.

## 2016-02-02 NOTE — Care Management Note (Signed)
Case Management Note  Patient Details  Name: [name] MRN: [id number] Date of Birth: Aug 09, 1965  Subjective/Objective:                    Action/Plan:  Spoke with patient explained all Home health orders and orders for walker and wheelchair faxed to worker's comp case [place] phone [phone] and fax [phone]. Patient does not want a bedside commode.   Spoke with Ms Randel Pigg she is aware patient discharging to home today and of above. Ms Randel Pigg will call patient and his wife regarding home health and DME. Ms Randel Pigg will schedule follow up appointment with Dr Redmond Pulling also and communicate with patient and wife.  Will fax discharge summary when completed.  Expected Discharge Date:                  Expected Discharge Plan:  [place]  In-House Referral:     Discharge planning Services  CM Consult  Post Acute Care Choice:  Home Health Choice offered to:  Patient, Spouse  DME Arranged:  Walker rolling, Wheelchair manual DME Agency:     HH Arranged:  PT, RN [place] Agency:     Status of Service:  Completed, signed off  If discussed at [employer] of Stay Meetings, dates discussed:    Additional Comments:  Marilu Favre, RN 02/02/2016, 10:47 AM

## 2016-02-02 NOTE — Progress Notes (Signed)
PT Cancellation Note  Patient Details Name: [name] MRN: [id number] DOB: April 12, 1966   Cancelled Treatment:    Reason Eval/Treat Not Completed: Patient declined, no reason specified (just wants to go home)   [facility] 02/02/2016, 1:59 PM

## 2016-02-02 NOTE — Discharge Instructions (Signed)
[facility]      [facility] [phone]  OPEN ABDOMINAL SURGERY: POST OP INSTRUCTIONS  Always review your discharge instruction sheet given to you by the facility where your surgery was performed.  IF YOU HAVE DISABILITY OR FAMILY LEAVE FORMS, YOU MUST BRING THEM TO THE OFFICE FOR PROCESSING.  PLEASE DO NOT GIVE THEM TO YOUR DOCTOR.  1. A prescription for pain medication may be given to you upon discharge.  Take your pain medication as prescribed, if needed.  If narcotic pain medicine is not needed, then you may take acetaminophen (Tylenol) or ibuprofen (Advil) as needed. 2. Take your usually prescribed medications unless otherwise directed. 3. If you need a refill on your pain medication, please contact your pharmacy. They will contact our office to request authorization.  Prescriptions will not be filled after 5pm or on week-ends. 4. You should follow a light diet the first few days after arrival home, such as soup and crackers, pudding, etc.unless your doctor has advised otherwise. A high-fiber, low fat diet can be resumed as tolerated.   Be sure to include lots of fluids daily. Most patients will experience some swelling and bruising on the chest and neck area.  Ice packs will help.  Swelling and bruising can take several days to resolve 5. Most patients will experience some swelling and bruising in the area of the incision. Ice pack will help. Swelling and bruising can take several days to resolve..  6. It is common to experience some constipation if taking pain medication after surgery.  Increasing fluid intake and taking a stool softener will usually help or prevent this problem from occurring.  A mild laxative (Milk of Magnesia or Miralax) should be taken according to package directions if there are no bowel movements after 48 hours. 7.  You may have steri-strips (small skin tapes) in place directly over the incision.  These strips should be left on the skin for 7-10 days.  If your  surgeon used skin glue on the incision, you may shower in 24 hours.  The glue will flake off over the next 2-3 weeks.  Any sutures or staples will be removed at the office during your follow-up visit. You may find that a light gauze bandage over your incision may keep your staples from being rubbed or pulled. You may shower and replace the bandage daily. 8. ACTIVITIES:  You may resume regular (light) daily activities beginning the next day--such as daily self-care, walking, climbing stairs--gradually increasing activities as tolerated.  You may have sexual intercourse when it is comfortable.  Refrain from any heavy lifting or straining until approved by your doctor. a. You may drive when you no longer are taking prescription pain medication, you can comfortably wear a seatbelt, and you can safely maneuver your car and apply brakes b. Return to Work: ___________________________________ 9. You should see your doctor in the office for a follow-up appointment approximately two weeks after your surgery.  Make sure that you call for this appointment within a day or two after you arrive home to insure a convenient appointment time. OTHER INSTRUCTIONS:  _____________________________________________________________ _____________________________________________________________  WHEN TO CALL YOUR DOCTOR: 1. Fever over 101.0 2. Inability to urinate 3. Nausea and/or vomiting 4. Extreme swelling or bruising 5. Continued bleeding from incision. 6. Increased pain, redness, or drainage from the incision. 7. Difficulty swallowing or breathing 8. Muscle cramping or spasms. 9. Numbness or tingling in hands or feet or around lips.  The clinic staff is available to   answer your questions during regular business hours.  Please don't hesitate to call and ask to speak to one of the nurses if you have concerns.  For further questions, please visit [email or web address]   

## 2016-02-02 NOTE — Progress Notes (Signed)
Patient ID: [name], male   DOB: 07-09-1965, 50 y.o.   MRN: [id number]  [facility] Surgery Progress Note  6 Days Post-Op  Subjective: Angry and ready to go home. Had BM yesterday. Urinating on own since foley d/c. Tolerating diet. Ambulating well with walker.  Objective: Vital signs in last 24 hours: Temp:  [97.8 F (36.6 C)-98.8 F (37.1 C)] 98.8 F (37.1 C) (10/19 0843) Pulse Rate:  [73-87] 73 (10/19 0843) Resp:  [15-19] 16 (10/19 0843) BP: (97-120)/(58-70) 97/58 (10/19 0843) SpO2:  [96 %-100 %] 99 % (10/19 0843) Last BM Date: 02/01/16  Intake/Output from previous day: 10/18 0701 - 10/19 0700 In: 2291.6 [P.O.:940; I.V.:1251.6; IV Piggyback:100] Out: 2865 [Urine:2865] Intake/Output this shift: Total I/O In: 240 [P.O.:240] Out: 300 [Urine:300]  PE: Gen: Alert, NAD, agitated Pulm: effort normal, CTAB Abd: Soft, ND, +BS, open midlineincision pink with no purulent drainage  Lab Results:  No results for input(s): WBC, HGB, HCT, PLT in the last 72 hours. BMET No results for input(s): NA, K, CL, CO2, GLUCOSE, BUN, CREATININE, CALCIUM in the last 72 hours. PT/INR No results for input(s): LABPROT, INR in the last 72 hours. CMP     Component Value Date/Time   NA 136 01/29/2016 0538   K 3.5 01/29/2016 0538   CL 104 01/29/2016 0538   CO2 26 01/29/2016 0538   GLUCOSE 115 (H) 01/29/2016 0538   BUN 15 01/29/2016 0538   CREATININE 0.54 (L) 01/29/2016 0538   CALCIUM 7.5 (L) 01/29/2016 0538   PROT 6.5 01/27/2016 0435   ALBUMIN 3.1 (L) 01/27/2016 0435   AST 23 01/27/2016 0435   ALT 12 (L) 01/27/2016 0435   ALKPHOS 83 01/27/2016 0435   BILITOT 0.6 01/27/2016 0435   GFRNONAA >60 01/29/2016 0538   GFRAA >60 01/29/2016 0538   Lipase     Component Value Date/Time   LIPASE 15 01/27/2016 0435       Studies/Results: No results found.  Anti-infectives: Anti-infectives    Start     Dose/Rate Route Frequency Ordered Stop   01/29/16 2200  cefTRIAXone  (ROCEPHIN) 2 g in dextrose 5 % 50 mL IVPB     2 g 100 mL/hr over 30 Minutes Intravenous Every 24 hours 01/29/16 1419     01/27/16 0630  piperacillin-tazobactam (ZOSYN) IVPB 3.375 g     3.375 g 12.5 mL/hr over 240 Minutes Intravenous Every 8 hours 01/27/16 0624 01/29/16 1800       Assessment/Plan s/p Procedure(s): EXPLORATORY LAPAROTOMY (N/A) SMALL BOWEL RESECTION (N/A)01/27/16 Dr. Redmond Pulling - POD 6 - chronic pain issues making postop pain control difficult - passing gas and had BM  Chronic pain following multiple back surgeries  ID - rocephin 10/15>> FEN - regular, IVF VTE - lovenox  Plan - patient stable and ready for discharge. Will order rolling walker and wheelchair for home use. [place] consulted for wound care and PT. Patient will need follow-up with Dr. Redmond Pulling in about 2 weeks.   LOS: 6 days    Jerrye Beavers , [facility] Surgery 02/02/2016, 10:18 AM Pager: [phone] Consults: [phone] Mon-Fri 7:00 am-4:30 pm Sat-Sun 7:00 am-11:30 am  Agree with above. He is a difficult gentleman.  Note:  His path has come back suggestive of Crohn's disease.  This will need to be addressed on followup.  Alphonsa Overall, MD, [facility] Surgery Pager: [phone] Office phone:  [phone]

## 2016-02-03 NOTE — Discharge Summary (Addendum)
  [facility] Discharge Summary   Patient ID: [name] MRN: [id number] DOB/AGE: [birth year] 50 y.o.  Admit date: 01/27/2016 Discharge date: 02/02/2016  Admitting Diagnosis: Free intraperitoneal air  Discharge Diagnosis Patient Active Problem List   Diagnosis Date Noted  . Small bowel perforation ([place]) 01/27/2016   His path has come back suggestive of Crohn's disease.  Consultants None  Imaging: DG Abd Acute w/chest 01/27/16: Free intra-abdominal air consistent with perforated viscus. Gaseous bowel distention with air-fluid levels.  Procedures Dr. Redmond Pulling (01/27/16) - Exploratory laparotomy, small bowel resections  Hospital Course:  [name] is a 50yo male PMH significant for chronic pain following multiple back surgeries who presented to [facility] 01/27/16 with progressively worsening abdominal pain.  Workup showed free air consistent with perforated viscus.  Patient was emergently taken to the OR for exploration and underwent the above listed procedure.  Tolerated procedure well and was transferred to the floor.  Due to his chronic narcotic use pain management postoperatively was difficult. On POD3 his ileus was resolving, NG tube was removed, and diet advanced. On POD6 the patient was voiding well, tolerating diet, ambulating well, pain somewhat improved, vital signs stable, incisions c/d/i and felt stable for discharge home.  Home health was arranged to help with wound care and PT. Patient will follow up in our office in 2 weeks and knows to call with questions or concerns.  He will call to confirm appointment date/time.    Physical Exam: Gen: Alert, NAD, agitated Pulm: effort normal, CTAB Abd: Soft, ND, +BS, open midlineincision pink with no purulent drainage    Medication List    TAKE these medications   GOODY PM PO Take 1 Package by mouth daily as needed (sleep/pain).   oxymorphone 40 MG 12 hr tablet Commonly known as:  OPANA ER Take 40 mg by  mouth 3 (three) times daily.   XANAX 0.5 MG tablet Generic drug:  ALPRAZolam Take 0.5 mg by mouth 3 (three) times daily as needed for anxiety.        Follow-up Information    Gayland Curry, MD. Call today.   Specialty:  General Surgery Why:  please call as soon as you leave the hospital to make a follow-up appointment with Dr. Redmond Pulling in about 2 weeks. Contact information: [address] [place] [address] [phone]           Signed: Jerrye Beavers, [facility] Surgery 02/03/2016, 2:05 PM Pager: [phone] Consults: [phone] Mon-Fri 7:00 am-4:30 pm Sat-Sun 7:00 am-11:30 am  Agree with above. His path has come back suggestive of Crohn's disease. This was not addressed during his hospitalization.  It will need follow up with GI.  Alphonsa Overall, MD, [facility] Surgery Pager: [phone] Office phone:  [phone]

## 2016-02-08 ENCOUNTER — Inpatient Hospital Stay (HOSPITAL_COMMUNITY)
Admission: EM | Admit: 2016-02-08 | Discharge: 2016-03-04 | DRG: 853 | Disposition: A | Discharge status: Home Health | Payer: Worker's Compensation | Attending: Internal Medicine | Admitting: Internal Medicine

## 2016-02-08 ENCOUNTER — Emergency Department (HOSPITAL_COMMUNITY): Payer: Worker's Compensation

## 2016-02-08 ENCOUNTER — Encounter (HOSPITAL_COMMUNITY): Payer: Self-pay | Admitting: Emergency Medicine

## 2016-02-08 ENCOUNTER — Emergency Department (HOSPITAL_COMMUNITY): Payer: Worker's Compensation | Admitting: Anesthesiology

## 2016-02-08 ENCOUNTER — Encounter (HOSPITAL_COMMUNITY)
Admission: EM | Disposition: A | Discharge status: Home Health | Payer: Self-pay | Source: Home / Self Care | Attending: Internal Medicine

## 2016-02-08 DIAGNOSIS — K659 Peritonitis, unspecified: Secondary | ICD-10-CM | POA: Diagnosis present

## 2016-02-08 DIAGNOSIS — E876 Hypokalemia: Secondary | ICD-10-CM | POA: Diagnosis not present

## 2016-02-08 DIAGNOSIS — R262 Difficulty in walking, not elsewhere classified: Secondary | ICD-10-CM

## 2016-02-08 DIAGNOSIS — D62 Acute posthemorrhagic anemia: Secondary | ICD-10-CM | POA: Diagnosis present

## 2016-02-08 DIAGNOSIS — K650 Generalized (acute) peritonitis: Secondary | ICD-10-CM | POA: Diagnosis present

## 2016-02-08 DIAGNOSIS — K651 Peritoneal abscess: Secondary | ICD-10-CM | POA: Diagnosis present

## 2016-02-08 DIAGNOSIS — J9811 Atelectasis: Secondary | ICD-10-CM | POA: Diagnosis not present

## 2016-02-08 DIAGNOSIS — T8130XA Disruption of wound, unspecified, initial encounter: Secondary | ICD-10-CM | POA: Diagnosis present

## 2016-02-08 DIAGNOSIS — J90 Pleural effusion, not elsewhere classified: Secondary | ICD-10-CM

## 2016-02-08 DIAGNOSIS — E8770 Fluid overload, unspecified: Secondary | ICD-10-CM | POA: Diagnosis present

## 2016-02-08 DIAGNOSIS — R109 Unspecified abdominal pain: Secondary | ICD-10-CM | POA: Diagnosis present

## 2016-02-08 DIAGNOSIS — R6521 Severe sepsis with septic shock: Secondary | ICD-10-CM | POA: Diagnosis present

## 2016-02-08 DIAGNOSIS — Z9289 Personal history of other medical treatment: Secondary | ICD-10-CM

## 2016-02-08 DIAGNOSIS — A419 Sepsis, unspecified organism: Secondary | ICD-10-CM | POA: Diagnosis not present

## 2016-02-08 DIAGNOSIS — IMO0002 Reserved for concepts with insufficient information to code with codable children: Secondary | ICD-10-CM

## 2016-02-08 DIAGNOSIS — Z1624 Resistance to multiple antibiotics: Secondary | ICD-10-CM | POA: Diagnosis not present

## 2016-02-08 DIAGNOSIS — K559 Vascular disorder of intestine, unspecified: Secondary | ICD-10-CM

## 2016-02-08 DIAGNOSIS — Z681 Body mass index (BMI) 19 or less, adult: Secondary | ICD-10-CM | POA: Diagnosis not present

## 2016-02-08 DIAGNOSIS — E43 Unspecified severe protein-calorie malnutrition: Secondary | ICD-10-CM | POA: Diagnosis present

## 2016-02-08 DIAGNOSIS — E871 Hypo-osmolality and hyponatremia: Secondary | ICD-10-CM | POA: Diagnosis present

## 2016-02-08 DIAGNOSIS — A4151 Sepsis due to Escherichia coli [E. coli]: Secondary | ICD-10-CM | POA: Diagnosis present

## 2016-02-08 DIAGNOSIS — B999 Unspecified infectious disease: Secondary | ICD-10-CM | POA: Diagnosis not present

## 2016-02-08 DIAGNOSIS — R935 Abnormal findings on diagnostic imaging of other abdominal regions, including retroperitoneum: Secondary | ICD-10-CM | POA: Diagnosis not present

## 2016-02-08 DIAGNOSIS — R188 Other ascites: Secondary | ICD-10-CM | POA: Diagnosis present

## 2016-02-08 DIAGNOSIS — K316 Fistula of stomach and duodenum: Secondary | ICD-10-CM | POA: Diagnosis not present

## 2016-02-08 DIAGNOSIS — B9689 Other specified bacterial agents as the cause of diseases classified elsewhere: Secondary | ICD-10-CM | POA: Diagnosis not present

## 2016-02-08 DIAGNOSIS — J810 Acute pulmonary edema: Secondary | ICD-10-CM | POA: Diagnosis not present

## 2016-02-08 DIAGNOSIS — B9620 Unspecified Escherichia coli [E. coli] as the cause of diseases classified elsewhere: Secondary | ICD-10-CM | POA: Diagnosis not present

## 2016-02-08 DIAGNOSIS — R198 Other specified symptoms and signs involving the digestive system and abdomen: Secondary | ICD-10-CM | POA: Diagnosis not present

## 2016-02-08 DIAGNOSIS — J9600 Acute respiratory failure, unspecified whether with hypoxia or hypercapnia: Secondary | ICD-10-CM | POA: Diagnosis present

## 2016-02-08 DIAGNOSIS — R509 Fever, unspecified: Secondary | ICD-10-CM

## 2016-02-08 DIAGNOSIS — N179 Acute kidney failure, unspecified: Secondary | ICD-10-CM

## 2016-02-08 DIAGNOSIS — J9690 Respiratory failure, unspecified, unspecified whether with hypoxia or hypercapnia: Secondary | ICD-10-CM

## 2016-02-08 DIAGNOSIS — F17210 Nicotine dependence, cigarettes, uncomplicated: Secondary | ICD-10-CM | POA: Diagnosis present

## 2016-02-08 DIAGNOSIS — K631 Perforation of intestine (nontraumatic): Secondary | ICD-10-CM | POA: Diagnosis present

## 2016-02-08 DIAGNOSIS — D72829 Elevated white blood cell count, unspecified: Secondary | ICD-10-CM

## 2016-02-08 DIAGNOSIS — Z981 Arthrodesis status: Secondary | ICD-10-CM

## 2016-02-08 DIAGNOSIS — E872 Acidosis: Secondary | ICD-10-CM | POA: Diagnosis present

## 2016-02-08 DIAGNOSIS — G9340 Encephalopathy, unspecified: Secondary | ICD-10-CM | POA: Diagnosis not present

## 2016-02-08 DIAGNOSIS — G894 Chronic pain syndrome: Secondary | ICD-10-CM | POA: Diagnosis present

## 2016-02-08 DIAGNOSIS — J45909 Unspecified asthma, uncomplicated: Secondary | ICD-10-CM | POA: Diagnosis present

## 2016-02-08 DIAGNOSIS — Z932 Ileostomy status: Secondary | ICD-10-CM | POA: Diagnosis not present

## 2016-02-08 DIAGNOSIS — J9601 Acute respiratory failure with hypoxia: Secondary | ICD-10-CM | POA: Diagnosis not present

## 2016-02-08 DIAGNOSIS — Z9049 Acquired absence of other specified parts of digestive tract: Secondary | ICD-10-CM

## 2016-02-08 DIAGNOSIS — E440 Moderate protein-calorie malnutrition: Secondary | ICD-10-CM | POA: Diagnosis not present

## 2016-02-08 HISTORY — PX: ILEOCECETOMY: SHX5857

## 2016-02-08 HISTORY — PX: LAPAROTOMY: SHX154

## 2016-02-08 HISTORY — PX: APPLICATION OF WOUND VAC: SHX5189

## 2016-02-08 LAB — URINALYSIS, ROUTINE W REFLEX MICROSCOPIC
Bilirubin Urine: NEGATIVE
GLUCOSE, UA: NEGATIVE mg/dL
Ketones, ur: NEGATIVE mg/dL
Leukocytes, UA: NEGATIVE
Nitrite: NEGATIVE
PROTEIN: 30 mg/dL — AB
Specific Gravity, Urine: >1.046 — ABNORMAL HIGH (ref 1.005–1.030)
pH: 6 (ref 5.0–8.0)

## 2016-02-08 LAB — BASIC METABOLIC PANEL
ANION GAP: 10 (ref 5–15)
BUN: 17 mg/dL (ref 6–20)
CHLORIDE: 103 mmol/L (ref 101–111)
CO2: 18 mmol/L — AB (ref 22–32)
Calcium: 6 mg/dL — CL (ref 8.9–10.3)
Creatinine, Ser: 0.83 mg/dL (ref 0.61–1.24)
GFR calc non Af Amer: >60 mL/min (ref >60)
Glucose, Bld: 165 mg/dL — ABNORMAL HIGH (ref 65–99)
Potassium: 3.9 mmol/L (ref 3.5–5.1)
SODIUM: 131 mmol/L — AB (ref 135–145)

## 2016-02-08 LAB — CBC WITH DIFFERENTIAL/PLATELET
BASOS ABS: 0 10*3/uL (ref 0.0–0.1)
Basophils Relative: 0 %
Eosinophils Absolute: 0 10*3/uL (ref 0.0–0.7)
Eosinophils Relative: 0 %
HCT: 31.9 % — ABNORMAL LOW (ref 39.0–52.0)
Hemoglobin: 10.7 g/dL — ABNORMAL LOW (ref 13.0–17.0)
LYMPHS ABS: 0.4 10*3/uL — AB (ref 0.7–4.0)
Lymphocytes Relative: 5 %
MCH: 26.7 pg (ref 26.0–34.0)
MCHC: 33.5 g/dL (ref 30.0–36.0)
MCV: 79.6 fL (ref 78.0–100.0)
MONO ABS: 0.9 10*3/uL (ref 0.1–1.0)
Monocytes Relative: 11 %
NEUTROS PCT: 84 %
Neutro Abs: 6.8 10*3/uL (ref 1.7–7.7)
PLATELETS: 563 10*3/uL — AB (ref 150–400)
RBC: 4.01 MIL/uL — AB (ref 4.22–5.81)
RDW: 14.2 % (ref 11.5–15.5)
WBC Morphology: INCREASED
WBC: 8.1 10*3/uL (ref 4.0–10.5)

## 2016-02-08 LAB — I-STAT CG4 LACTIC ACID, ED
Lactic Acid, Venous: 6.32 mmol/L (ref 0.5–1.9)
Lactic Acid, Venous: 6.78 mmol/L (ref 0.5–1.9)

## 2016-02-08 LAB — PROCALCITONIN: PROCALCITONIN: 31.88 ng/mL

## 2016-02-08 LAB — I-STAT ARTERIAL BLOOD GAS, ED
Acid-Base Excess: 1 mmol/L (ref 0.0–2.0)
BICARBONATE: 25.4 mmol/L (ref 20.0–28.0)
O2 Saturation: 100 %
PH ART: 7.423 (ref 7.350–7.450)
TCO2: 27 mmol/L (ref 0–100)
pCO2 arterial: 38.9 mmHg (ref 32.0–48.0)
pO2, Arterial: 497 mmHg — ABNORMAL HIGH (ref 83.0–108.0)

## 2016-02-08 LAB — BLOOD GAS, ARTERIAL
ACID-BASE DEFICIT: 4.9 mmol/L — AB (ref 0.0–2.0)
BICARBONATE: 19.7 mmol/L — AB (ref 20.0–28.0)
Drawn by: 441371
FIO2: 50
LHR: 18 {breaths}/min
O2 SAT: 98.2 %
PATIENT TEMPERATURE: 98.2
PCO2 ART: 35.9 mmHg (ref 32.0–48.0)
PEEP/CPAP: 5 cmH2O
PH ART: 7.357 (ref 7.350–7.450)
VT: 570 mL
pO2, Arterial: 167 mmHg — ABNORMAL HIGH (ref 83.0–108.0)

## 2016-02-08 LAB — COMPREHENSIVE METABOLIC PANEL
ALBUMIN: 1.5 g/dL — AB (ref 3.5–5.0)
ALT: 17 U/L (ref 17–63)
ANION GAP: 14 (ref 5–15)
AST: 34 U/L (ref 15–41)
Alkaline Phosphatase: 147 U/L — ABNORMAL HIGH (ref 38–126)
BUN: 14 mg/dL (ref 6–20)
CHLORIDE: 98 mmol/L — AB (ref 101–111)
CO2: 24 mmol/L (ref 22–32)
Calcium: 7.2 mg/dL — ABNORMAL LOW (ref 8.9–10.3)
Creatinine, Ser: 1 mg/dL (ref 0.61–1.24)
GFR calc Af Amer: >60 mL/min (ref >60)
GFR calc non Af Amer: >60 mL/min (ref >60)
GLUCOSE: 108 mg/dL — AB (ref 65–99)
POTASSIUM: 2.8 mmol/L — AB (ref 3.5–5.1)
SODIUM: 136 mmol/L (ref 135–145)
TOTAL PROTEIN: 4.6 g/dL — AB (ref 6.5–8.1)
Total Bilirubin: 0.8 mg/dL (ref 0.3–1.2)

## 2016-02-08 LAB — CBC
HEMATOCRIT: 29.5 % — AB (ref 39.0–52.0)
HEMOGLOBIN: 10.1 g/dL — AB (ref 13.0–17.0)
MCH: 27.1 pg (ref 26.0–34.0)
MCHC: 34.2 g/dL (ref 30.0–36.0)
MCV: 79.1 fL (ref 78.0–100.0)
Platelets: 631 10*3/uL — ABNORMAL HIGH (ref 150–400)
RBC: 3.73 MIL/uL — AB (ref 4.22–5.81)
RDW: 14.4 % (ref 11.5–15.5)
WBC: 22.8 10*3/uL — AB (ref 4.0–10.5)

## 2016-02-08 LAB — PROTIME-INR
INR: 1.71
PROTHROMBIN TIME: 20.3 s — AB (ref 11.4–15.2)

## 2016-02-08 LAB — MRSA PCR SCREENING: MRSA by PCR: NEGATIVE

## 2016-02-08 LAB — APTT: APTT: 33 s (ref 24–36)

## 2016-02-08 LAB — TROPONIN I: Troponin I: 0.03 ng/mL (ref <0.03)

## 2016-02-08 LAB — URINE MICROSCOPIC-ADD ON

## 2016-02-08 LAB — CORTISOL: Cortisol, Plasma: 57.6 ug/dL

## 2016-02-08 LAB — LIPASE, BLOOD: LIPASE: 16 U/L (ref 11–51)

## 2016-02-08 LAB — LACTATE DEHYDROGENASE: LDH: 153 U/L (ref 98–192)

## 2016-02-08 LAB — PREPARE RBC (CROSSMATCH)

## 2016-02-08 LAB — LACTIC ACID, PLASMA: LACTIC ACID, VENOUS: 4.3 mmol/L — AB (ref 0.5–1.9)

## 2016-02-08 LAB — GLUCOSE, CAPILLARY: GLUCOSE-CAPILLARY: 133 mg/dL — AB (ref 65–99)

## 2016-02-08 SURGERY — LAPAROTOMY, EXPLORATORY
Anesthesia: General | Site: Abdomen

## 2016-02-08 MED ORDER — STERILE WATER FOR INJECTION IJ SOLN
INTRAMUSCULAR | Status: AC
  Filled 2016-02-08: qty 10

## 2016-02-08 MED ORDER — SODIUM CHLORIDE 0.9 % IV BOLUS (SEPSIS)
1000.0000 mL | Freq: Once | INTRAVENOUS | Status: AC
  Administered 2016-02-08: 1000 mL via INTRAVENOUS

## 2016-02-08 MED ORDER — VECURONIUM BROMIDE 10 MG IV SOLR
INTRAVENOUS | Status: AC
  Filled 2016-02-08: qty 10

## 2016-02-08 MED ORDER — LACTATED RINGERS IV SOLN
INTRAVENOUS | Status: DC | PRN
Start: 2016-02-08 — End: 2016-02-08
  Administered 2016-02-08 (×3): via INTRAVENOUS

## 2016-02-08 MED ORDER — MIDAZOLAM HCL 2 MG/2ML IJ SOLN
INTRAMUSCULAR | Status: AC
  Filled 2016-02-08: qty 6

## 2016-02-08 MED ORDER — POTASSIUM CHLORIDE 10 MEQ/50ML IV SOLN
10.0000 meq | INTRAVENOUS | Status: AC
  Administered 2016-02-08 (×4): 10 meq via INTRAVENOUS
  Filled 2016-02-08 (×3): qty 50

## 2016-02-08 MED ORDER — SODIUM CHLORIDE 0.9 % IV SOLN
100.0000 mg | INTRAVENOUS | Status: DC
  Filled 2016-02-08 (×2): qty 100

## 2016-02-08 MED ORDER — PIPERACILLIN-TAZOBACTAM 3.375 G IVPB 30 MIN
3.3750 g | Freq: Once | INTRAVENOUS | Status: AC
  Administered 2016-02-08: 3.375 g via INTRAVENOUS
  Filled 2016-02-08: qty 50

## 2016-02-08 MED ORDER — NOREPINEPHRINE BITARTRATE 1 MG/ML IV SOLN
2.0000 ug/min | INTRAVENOUS | Status: DC
  Administered 2016-02-08: 30 ug/min via INTRAVENOUS
  Filled 2016-02-08 (×2): qty 4

## 2016-02-08 MED ORDER — NOREPINEPHRINE BITARTRATE 1 MG/ML IV SOLN
0.0000 ug/min | INTRAVENOUS | Status: DC
  Filled 2016-02-08: qty 4

## 2016-02-08 MED ORDER — PIPERACILLIN-TAZOBACTAM 3.375 G IVPB: 3.3750 g | Freq: Three times a day (TID) | INTRAVENOUS | Status: DC

## 2016-02-08 MED ORDER — VANCOMYCIN HCL IN DEXTROSE 750-5 MG/150ML-% IV SOLN
750.0000 mg | Freq: Three times a day (TID) | INTRAVENOUS | Status: DC
  Administered 2016-02-08 – 2016-02-09 (×3): 750 mg via INTRAVENOUS
  Filled 2016-02-08 (×4): qty 150

## 2016-02-08 MED ORDER — VANCOMYCIN HCL 10 G IV SOLR: 1500.0000 mg | Freq: Two times a day (BID) | INTRAVENOUS | Status: DC

## 2016-02-08 MED ORDER — VECURONIUM BROMIDE 10 MG IV SOLR
INTRAVENOUS | Status: AC | PRN
  Administered 2016-02-08: 10 mg via INTRAVENOUS

## 2016-02-08 MED ORDER — FENTANYL 2500MCG IN NS 250ML (10MCG/ML) PREMIX INFUSION
25.0000 ug/h | INTRAVENOUS | Status: DC
  Administered 2016-02-08 (×2): 100 ug/h via INTRAVENOUS
  Administered 2016-02-08: 25 ug/h via INTRAVENOUS
  Administered 2016-02-08: 300 ug/h via INTRAVENOUS
  Administered 2016-02-09: 350 ug/h via INTRAVENOUS
  Administered 2016-02-09: 300 ug/h via INTRAVENOUS
  Administered 2016-02-09: 350 ug/h via INTRAVENOUS
  Administered 2016-02-10: 35 ug/h via INTRAVENOUS
  Administered 2016-02-10 – 2016-02-12 (×6): 350 ug/h via INTRAVENOUS
  Administered 2016-02-12: 400 ug/h via INTRAVENOUS
  Administered 2016-02-12 (×2): 350 ug/h via INTRAVENOUS
  Administered 2016-02-12 – 2016-02-13 (×2): 400 ug/h via INTRAVENOUS
  Filled 2016-02-08 (×18): qty 250

## 2016-02-08 MED ORDER — IOPAMIDOL (ISOVUE-300) INJECTION 61%
INTRAVENOUS | Status: AC
  Filled 2016-02-08: qty 100

## 2016-02-08 MED ORDER — POTASSIUM CHLORIDE 10 MEQ/100ML IV SOLN
10.0000 meq | INTRAVENOUS | Status: AC
  Administered 2016-02-08: 10 meq via INTRAVENOUS
  Filled 2016-02-08 (×2): qty 100

## 2016-02-08 MED ORDER — FENTANYL CITRATE (PF) 100 MCG/2ML IJ SOLN
INTRAMUSCULAR | Status: AC
  Filled 2016-02-08: qty 2

## 2016-02-08 MED ORDER — ROCURONIUM BROMIDE 100 MG/10ML IV SOLN
INTRAVENOUS | Status: DC | PRN
  Administered 2016-02-08: 50 mg via INTRAVENOUS

## 2016-02-08 MED ORDER — IOPAMIDOL (ISOVUE-300) INJECTION 61%
INTRAVENOUS | Status: AC
  Filled 2016-02-08: qty 30

## 2016-02-08 MED ORDER — FENTANYL BOLUS VIA INFUSION
50.0000 ug | INTRAVENOUS | Status: DC | PRN
  Administered 2016-02-11: 50 ug via INTRAVENOUS
  Administered 2016-02-12 (×2): 100 ug via INTRAVENOUS
  Filled 2016-02-08 (×3): qty 50

## 2016-02-08 MED ORDER — HYDROMORPHONE HCL 2 MG/ML IJ SOLN
2.0000 mg | Freq: Once | INTRAMUSCULAR | Status: AC
  Administered 2016-02-08: 2 mg via INTRAVENOUS
  Filled 2016-02-08: qty 1

## 2016-02-08 MED ORDER — ETOMIDATE 2 MG/ML IV SOLN
INTRAVENOUS | Status: AC | PRN
  Administered 2016-02-08 (×2): 20 mg via INTRAVENOUS

## 2016-02-08 MED ORDER — SODIUM CHLORIDE 0.9 % IV SOLN
200.0000 mg | Freq: Once | INTRAVENOUS | Status: DC
  Filled 2016-02-08: qty 200

## 2016-02-08 MED ORDER — SODIUM CHLORIDE 0.9 % IV SOLN: Freq: Once | INTRAVENOUS | Status: AC

## 2016-02-08 MED ORDER — MAGNESIUM SULFATE 2 GM/50ML IV SOLN
2.0000 g | Freq: Once | INTRAVENOUS | Status: AC
  Administered 2016-02-08: 2 g via INTRAVENOUS
  Filled 2016-02-08: qty 50

## 2016-02-08 MED ORDER — PHENYLEPHRINE HCL 10 MG/ML IJ SOLN
20.0000 ug/min | INTRAVENOUS | Status: DC
  Administered 2016-02-08: 10 ug/min via INTRAVENOUS
  Administered 2016-02-08 (×2): 80 ug/min via INTRAVENOUS
  Administered 2016-02-08: 20 ug/min via INTRAVENOUS
  Administered 2016-02-08 (×3): 80 ug/min via INTRAVENOUS
  Filled 2016-02-08 (×5): qty 1

## 2016-02-08 MED ORDER — VASOPRESSIN 20 UNIT/ML IV SOLN
0.0300 [IU]/min | INTRAVENOUS | Status: DC
  Filled 2016-02-08: qty 2

## 2016-02-08 MED ORDER — SODIUM CHLORIDE 0.9 % IV SOLN
500.0000 mg | Freq: Four times a day (QID) | INTRAVENOUS | Status: DC
  Administered 2016-02-08 – 2016-02-09 (×4): 500 mg via INTRAVENOUS
  Filled 2016-02-08 (×7): qty 500

## 2016-02-08 MED ORDER — CHLORHEXIDINE GLUCONATE 0.12% ORAL RINSE (MEDLINE KIT)
15.0000 mL | Freq: Two times a day (BID) | OROMUCOSAL | Status: DC
  Administered 2016-02-08 – 2016-02-13 (×10): 15 mL via OROMUCOSAL

## 2016-02-08 MED ORDER — FENTANYL CITRATE (PF) 100 MCG/2ML IJ SOLN
INTRAMUSCULAR | Status: AC | PRN
  Administered 2016-02-08 (×4): 100 ug via INTRAVENOUS

## 2016-02-08 MED ORDER — FAMOTIDINE IN NACL 20-0.9 MG/50ML-% IV SOLN
20.0000 mg | Freq: Two times a day (BID) | INTRAVENOUS | Status: DC
  Administered 2016-02-08 – 2016-02-09 (×4): 20 mg via INTRAVENOUS
  Filled 2016-02-08 (×7): qty 50

## 2016-02-08 MED ORDER — 0.9 % SODIUM CHLORIDE (POUR BTL) OPTIME
TOPICAL | Status: DC | PRN
  Administered 2016-02-08 (×12): 1000 mL

## 2016-02-08 MED ORDER — HYDROMORPHONE HCL 2 MG/ML IJ SOLN
1.0000 mg | Freq: Once | INTRAMUSCULAR | Status: AC
Start: 2016-02-08 — End: 2016-02-08
  Administered 2016-02-08: 1 mg via INTRAVENOUS
  Filled 2016-02-08: qty 1

## 2016-02-08 MED ORDER — MIDAZOLAM HCL 5 MG/5ML IJ SOLN
INTRAMUSCULAR | Status: AC | PRN
  Administered 2016-02-08: 2 mg via INTRAVENOUS
  Administered 2016-02-08: 4 mg via INTRAVENOUS
  Administered 2016-02-08: 2 mg via INTRAVENOUS

## 2016-02-08 MED ORDER — IOPAMIDOL (ISOVUE-300) INJECTION 61%
100.0000 mL | Freq: Once | INTRAVENOUS | Status: AC | PRN
  Administered 2016-02-08: 100 mL via INTRAVENOUS

## 2016-02-08 MED ORDER — CALCIUM CHLORIDE 10 % IV SOLN
2.0000 g | Freq: Once | INTRAVENOUS | Status: AC
  Administered 2016-02-08: 2 g via INTRAVENOUS
  Filled 2016-02-08: qty 10

## 2016-02-08 MED ORDER — FENTANYL CITRATE (PF) 100 MCG/2ML IJ SOLN
INTRAMUSCULAR | Status: DC | PRN
  Administered 2016-02-08 (×2): 50 ug via INTRAVENOUS

## 2016-02-08 MED ORDER — POTASSIUM CHLORIDE 10 MEQ/100ML IV SOLN
INTRAVENOUS | Status: DC | PRN
  Administered 2016-02-08: 10 meq via INTRAVENOUS

## 2016-02-08 MED ORDER — SODIUM CHLORIDE 0.9 % IV BOLUS (SEPSIS)
250.0000 mL | Freq: Once | INTRAVENOUS | Status: AC
  Administered 2016-02-08: 250 mL via INTRAVENOUS

## 2016-02-08 MED ORDER — MIDAZOLAM BOLUS VIA INFUSION
1.0000 mg | INTRAVENOUS | Status: DC | PRN
  Administered 2016-02-11: 2 mg via INTRAVENOUS
  Filled 2016-02-08: qty 2

## 2016-02-08 MED ORDER — ORAL CARE MOUTH RINSE
15.0000 mL | OROMUCOSAL | Status: DC
  Administered 2016-02-08 – 2016-02-09 (×4): 15 mL via OROMUCOSAL

## 2016-02-08 MED ORDER — POTASSIUM CHLORIDE 10 MEQ/50ML IV SOLN
INTRAVENOUS | Status: AC
  Filled 2016-02-08: qty 50

## 2016-02-08 MED ORDER — VANCOMYCIN HCL 10 G IV SOLR
1500.0000 mg | Freq: Once | INTRAVENOUS | Status: DC
  Filled 2016-02-08: qty 1500

## 2016-02-08 MED ORDER — SODIUM CHLORIDE 0.9 % IV SOLN
1.0000 g | INTRAVENOUS | Status: DC
  Filled 2016-02-08: qty 1

## 2016-02-08 MED ORDER — SODIUM CHLORIDE 0.9 % IV SOLN
0.0000 mg/h | INTRAVENOUS | Status: DC
  Administered 2016-02-08 (×2): 2 mg/h via INTRAVENOUS
  Administered 2016-02-09: 4 mg/h via INTRAVENOUS
  Administered 2016-02-09: 2 mg/h via INTRAVENOUS
  Administered 2016-02-10 – 2016-02-13 (×6): 4 mg/h via INTRAVENOUS
  Filled 2016-02-08 (×11): qty 10

## 2016-02-08 MED ORDER — NOREPINEPHRINE BITARTRATE 1 MG/ML IV SOLN
2.0000 ug/min | INTRAVENOUS | Status: DC
  Administered 2016-02-08: 30 ug/min via INTRAVENOUS
  Administered 2016-02-09 (×2): 20 ug/min via INTRAVENOUS
  Filled 2016-02-08 (×4): qty 16

## 2016-02-08 MED ORDER — ONDANSETRON HCL 4 MG/2ML IJ SOLN
4.0000 mg | Freq: Once | INTRAMUSCULAR | Status: AC
  Administered 2016-02-08: 4 mg via INTRAVENOUS
  Filled 2016-02-08: qty 2

## 2016-02-08 MED ORDER — NOREPINEPHRINE BITARTRATE 1 MG/ML IV SOLN
0.0000 ug/min | INTRAVENOUS | Status: DC
  Administered 2016-02-08: 5 ug/min via INTRAVENOUS
  Filled 2016-02-08: qty 4

## 2016-02-08 MED ORDER — ROCURONIUM BROMIDE 10 MG/ML (PF) SYRINGE
PREFILLED_SYRINGE | INTRAVENOUS | Status: AC
  Filled 2016-02-08: qty 10

## 2016-02-08 MED ORDER — HYDROMORPHONE HCL 2 MG/ML IJ SOLN
1.0000 mg | Freq: Once | INTRAMUSCULAR | Status: AC
  Administered 2016-02-08: 1 mg via INTRAVENOUS
  Filled 2016-02-08: qty 1

## 2016-02-08 MED ORDER — ARTIFICIAL TEARS OP OINT
TOPICAL_OINTMENT | OPHTHALMIC | Status: AC
  Filled 2016-02-08: qty 3.5

## 2016-02-08 SURGICAL SUPPLY — 42 items
BRR ADH 5X3 SEPRAFILM 6 SHT (MISCELLANEOUS)
CANISTER SUCTION 2500CC (MISCELLANEOUS) ×6 IMPLANT
CANISTER WOUND CARE 500ML ATS (WOUND CARE) ×3 IMPLANT
COVER SURGICAL LIGHT HANDLE (MISCELLANEOUS) ×3 IMPLANT
DRAPE LAPAROSCOPIC ABDOMINAL (DRAPES) ×3 IMPLANT
DRAPE WARM FLUID 44X44 (DRAPE) ×3 IMPLANT
ELECT BLADE 6.5 EXT (BLADE) ×3 IMPLANT
ELECT CAUTERY BLADE 6.4 (BLADE) ×3 IMPLANT
ELECT REM PT RETURN 9FT ADLT (ELECTROSURGICAL) ×3
ELECTRODE REM PT RTRN 9FT ADLT (ELECTROSURGICAL) ×1 IMPLANT
GLOVE BIO SURGEON STRL SZ8 (GLOVE) ×3 IMPLANT
GLOVE BIOGEL PI IND STRL 6.5 (GLOVE) ×1 IMPLANT
GLOVE BIOGEL PI IND STRL 8 (GLOVE) ×2 IMPLANT
GLOVE BIOGEL PI INDICATOR 6.5 (GLOVE) ×2
GLOVE BIOGEL PI INDICATOR 8 (GLOVE) ×4
GLOVE ECLIPSE 6.0 STRL STRAW (GLOVE) ×3 IMPLANT
GLOVE ECLIPSE 7.5 STRL STRAW (GLOVE) ×3 IMPLANT
GOWN STRL REUS W/ TWL LRG LVL3 (GOWN DISPOSABLE) ×2 IMPLANT
GOWN STRL REUS W/TWL LRG LVL3 (GOWN DISPOSABLE) ×6
GOWN STRL REUS W/TWL XL LVL3 (GOWN DISPOSABLE) ×3 IMPLANT
KIT BASIN OR (CUSTOM PROCEDURE TRAY) ×3 IMPLANT
KIT ROOM TURNOVER OR (KITS) ×3 IMPLANT
LIGASURE IMPACT 36 18CM CVD LR (INSTRUMENTS) IMPLANT
NS IRRIG 1000ML POUR BTL (IV SOLUTION) ×27 IMPLANT
PACK GENERAL/GYN (CUSTOM PROCEDURE TRAY) ×3 IMPLANT
PAD ARMBOARD 7.5X6 YLW CONV (MISCELLANEOUS) ×3 IMPLANT
SEPRAFILM PROCEDURAL PACK 3X5 (MISCELLANEOUS) IMPLANT
SPECIMEN JAR LARGE (MISCELLANEOUS) IMPLANT
SPONGE ABDOMINAL VAC ABTHERA (MISCELLANEOUS) ×3 IMPLANT
SPONGE LAP 18X18 X RAY DECT (DISPOSABLE) ×3 IMPLANT
STAPLER PROXIMATE 75MM BLUE (STAPLE) ×3 IMPLANT
STAPLER VISISTAT 35W (STAPLE) ×3 IMPLANT
SUCTION POOLE TIP (SUCTIONS) ×3 IMPLANT
SUT PROLENE 2 0 CT2 30 (SUTURE) ×6 IMPLANT
SUT SILK 2 0 SH CR/8 (SUTURE) ×3 IMPLANT
SUT SILK 2 0 TIES 10X30 (SUTURE) ×3 IMPLANT
SUT SILK 3 0 SH CR/8 (SUTURE) ×3 IMPLANT
SUT SILK 3 0 TIES 10X30 (SUTURE) ×3 IMPLANT
SWAB COLLECTION DEVICE MRSA (MISCELLANEOUS) ×3 IMPLANT
SWAB CULTURE ESWAB REG 1ML (MISCELLANEOUS) ×3 IMPLANT
TOWEL OR 17X26 10 PK STRL BLUE (TOWEL DISPOSABLE) ×3 IMPLANT
TRAY FOLEY CATH 16FRSI W/METER (SET/KITS/TRAYS/PACK) ×3 IMPLANT

## 2016-02-08 NOTE — Care Management Note (Signed)
Case Management Note  Patient Details  Name: [name] MRN: [id number] Date of Birth: 1965/09/05  Subjective/Objective:  Pt admitted on 02/08/16  to 52M s/p exp lap, ileocecectomy, and application of wound VAC.   PTA, pt resided at home with wife and was receiving [facility] services from [facility], per Bo Merino, Worker's Comp Case Manager.  (phone: [phone], fax:  [phone]).  Cecille Rubin states pt's initial [place] injury was in 2008, but this issue appears to be related to pt's chronic narcotic use, and WC plans to cover it.  She plans to call and update admitting with [facility] information today.  She states pt was in hospital about a week ago with a bowel perforation requiring surgery, and had a very difficult time controlling his pain post-op.               Action/Plan: Will follow for discharge planning as pt progresses.  Faxed available clinical information to Bo Merino, [facility] Case Manager, per her request, to above fax #.    Expected Discharge Date:                Expected Discharge Plan:  [place]  In-House Referral:  NA  Discharge planning Services  CM Consult  Post Acute Care Choice:  Resumption of Svcs/PTA Provider, Home Health Choice offered to:     DME Arranged:    DME Agency:  [facility]  HH Arranged:    [place] Agency:     Status of Service:  In process, will continue to follow  If discussed at Long Length of Stay Meetings, dates discussed:    Additional Comments:  Reinaldo Raddle, RN, BSN  Trauma/Neuro ICU Case Manager [phone]

## 2016-02-08 NOTE — Procedures (Signed)
Central Venous Catheter Insertion Procedure Note [name] [id number] [birth year]  Procedure: Insertion of Central Venous Catheter Indications: Assessment of intravascular volume, Drug and/or fluid administration and Frequent blood sampling  Procedure Details Consent: Risks of procedure as well as the alternatives and risks of each were explained to the (patient/caregiver).  Consent for procedure obtained. Time Out: Verified patient identification, verified procedure, site/side was marked, verified correct patient position, special equipment/implants available, medications/allergies/relevent history reviewed, required imaging and test results available.  Performed  Maximum sterile technique was used including antiseptics, cap, gloves, gown, hand hygiene, mask and sheet. Skin prep: Chlorhexidine; local anesthetic administered A antimicrobial bonded/coated triple lumen catheter was placed in the right internal jugular vein using the Seldinger technique.  Evaluation Blood flow good Complications: No apparent complications Patient did tolerate procedure well. Chest X-ray ordered to verify placement.  CXR: pending.  Raylene Miyamoto 02/08/2016, 9:10 AM  [place] Tolerated well  Lavon Paganini. Titus Mould, MD, [place] Pgr: [facility]

## 2016-02-08 NOTE — Op Note (Signed)
OPERATIVE REPORT  DATE OF OPERATION: 02/08/2016  PATIENT:  [name]  50 y.o. male  PRE-OPERATIVE DIAGNOSIS:  INTRAABDOMINAL ABSCESS   POST-OPERATIVE DIAGNOSIS:  INTRAABDOMINAL ABSCESS AND LEAK FROM ILEOCOLONIC ANASTAMOSIS  INDICATION(S) FOR OPERATION:  Peritonitis with intra-abdominal abscesses  FINDINGS:  2.0 liters of intra-abdominal abscess with leak at the ileocolonic anastomosis.  Diffuse peritonitis  PROCEDURE:  Procedure(s): EXPLORATORY LAPAROTOMY APPLICATION OF WOUND VAC ILEOCECETOMY  SURGEON:  Surgeon(s): Judeth Horn, MD Georganna Skeans, MD  ASSISTANT: Grandville Silos, MD  ANESTHESIA:   general  COMPLICATIONS:  None  EBL: 250 ml  BLOOD ADMINISTERED: none  DRAINS: Nasogastric Tube, Urinary Catheter (Foley) and Negative pressure wound dression on open abdomen   SPECIMEN:  Source of Specimen:  Ileocolonic anastomosis  COUNTS CORRECT:  YES  PROCEDURE DETAILS: The patient was taken to the operating room and placed on the table in supine position. After an adequate general endotracheal anesthetic was administered he was prepped and draped in usual sterile manner exposing his partially open midline wound from his previous laparotomy.  A proper timeout was performed identifying the patient and procedure to be performed. The stretched out the port out midline fascial sutures were cut allowing [place] to open up the midline incision looked that further however there were omental and attachments of the abdominal wall to the bowel underneath. This had to be bluntly dissected away with the surgeon's hand as we got into a pocket of large amount of foul-smelling pus initially in the right lower quadrant which extended throughout the peritoneal cavity.  We had to carefully dissect away the omentum and the bowel from the anterior abdominal wall in the midline. A total of 2 L of purulent foul-smelling fluid was drained from the peritoneal cavity. A significant amount of time was spent  taking down adhesions and opening up interloop small bowel abscesses containing the pus mentioned previously.  Upon further expiration reveals found that in the posterior lateral portion of the previous oversewn anastomosis in the right lower quadrant there was a stool leak. Significant amount of time was spent mobilizing the right colon up to the right transverse colon where we came across with a 75 GIA blue stapler. We also came across the terminal ileum with a stapler which isolated out the what appeared to be ischemic and leaking anastomosis of the right ileocolonic anastomosis. The mesentery was taken between 2-0 silk ties and a Kelly clamp. We resected the ileocolonic anastomosis and sent off as separate specimen. It did appear to be somewhat ischemic but certainly had been the site of origination of the diffuse peritonitis.  There was some leakage of fecal material in the right lower quadrant from where during mobilization the colonic wall was torn because of thinness and likely ischemia. This is irrigated free as we use up to 7 L of saline to clean out the peritoneal cavity prior to the temporary closure.  Once we have the ileocolonic anastomosis removed we ran the small bowel from the ileum up to the ligament of Treitz. Again interloop abscesses were encountered and opened. We left the bowel discontinuity and likely will bring out an ileostomy at the next operation. The right transverse colon was marked with a 2-0 Prolene suture. We irrigated and subsequently closed using a temporary negative pressure wound dressing. All counts were correct including needles, sponges, and instruments. The patient will return to the operating room on Friday for reexamination and possible closure and possible creation of a ileostomy.  PATIENT DISPOSITION:  ICU -  intubated and critically ill.   [name] 10/25/201712:01 PM

## 2016-02-08 NOTE — ED Notes (Signed)
Pt taken to CT.

## 2016-02-08 NOTE — Transfer of Care (Signed)
Immediate Anesthesia Transfer of Care Note  Patient: [name]  Procedure(s) Performed: Procedure(s): EXPLORATORY LAPAROTOMY (N/A) APPLICATION OF WOUND VAC (N/A) ILEOCECETOMY (N/A)  Patient Location: ICU  Anesthesia Type:General  Level of Consciousness: sedated, unresponsive and Patient remains intubated per anesthesia plan  Airway & Oxygen Therapy: Patient remains intubated per anesthesia plan and Patient placed on Ventilator (see vital sign flow sheet for setting)  Post-op Assessment: Report given to RN and Post -op Vital signs reviewed and stable  Post vital signs: Reviewed and stable  Last Vitals:  Vitals:   02/08/16 1003 02/08/16 1006  BP: 116/68 103/60  Pulse: 114 110  Resp: 20 20  Temp:      Last Pain:  Vitals:   02/08/16 0820  TempSrc:   PainSc: 10-Worst pain ever         Complications: No apparent anesthesia complications

## 2016-02-08 NOTE — H&P (Signed)
[facility] Admission Note  [name] [facility] [birth year]  [id number].    Requesting MD: Dina Rich, MD Chief Complaint/Reason for Consult: free intra-abdominal fluid, peritonitis HPI:  50 y/o male with PMH back pain and small bowel perforation s/p ileocecectomy 01/28/16 who presented to [facility] with abdominal pain. He was previously discharged from hospital on 02/02/16. Patient states his abdominal pain started late last night/early this morning and is described as sharp and non-radiating. Rates pain as 10/10. Pain is worse with movement and inspiration. No relief with PO pain medication. Associated symptoms include nausea, chills, and SOB. Patient has been having daily BMs. Denies fever, CP, vomiting, diarrhea, melena, hematochezia, urinary symptoms, and weakness. He denies regular medication use.  ED workup: - lactic acidosis 6.8 - WBC WNL - hypotension 86/49 - HR 98-108 BPM - stat ABG - pH 7.326, pCO2 49.9, pO2 162, HCO3 26.1 - CT scan significant for a fluid collection in the pelvis and a large amount of free fluid in the abdomen.  ROS: All systems reviewed and otherwise negative except for as above  No family history on file.  Past Medical History:  Diagnosis Date  . Asthma   . Complication of anesthesia    " I got confused "  . Pneumonia   . Small bowel perforation ([place]) 01/2016    Past Surgical History:  Procedure Laterality Date  . BACK SURGERY    . BOWEL RESECTION N/A 01/27/2016   Procedure: SMALL BOWEL RESECTION;  Surgeon: Greer Pickerel, MD;  Location: [place];  Service: General;  Laterality: N/A;  . EXPLORATORY LAPAROTOMY  01/27/2016  . LAPAROTOMY N/A 01/27/2016   Procedure: EXPLORATORY LAPAROTOMY;  Surgeon: Greer Pickerel, MD;  Location: [place];  Service: General;  Laterality: N/A;    Social History:  reports that he has been smoking Cigarettes.  He has a 7.50 pack-year smoking history. He has never used smokeless tobacco. He reports that he does not drink alcohol or  use drugs.  Allergies:  Allergies  Allergen Reactions  . Celecoxib     Other reaction(s): IRRITABILITY  . Clonazepam Itching  . Cyclobenzaprine     Other reaction(s): IRRITABILITY  . Dexamethasone     Other reaction(s): IRRITABILITY  . Diazepam     Other reaction(s): IRRITABILITY  . Etodolac     Other reaction(s): IRRITABILITY  . Gabapentin     Other reaction(s): IRRITABILITY  . Metaxalone     Other reaction(s): IRRITABILITY  . Methocarbamol     Other reaction(s): IRRITABILITY  . Pregabalin     Other reaction(s): IRRITABILITY  . Propoxyphene     Other reaction(s): IRRITABILITY  . Tramadol     Other reaction(s): IRRITABILITY     (Not in a hospital admission)  Blood pressure 95/58, pulse 106, temperature 97.5 F (36.4 C), temperature source Oral, resp. rate 24, height 5' 11"  (1.803 m), weight 158 lb (71.7 kg), SpO2 97 %. Physical Exam: General: thin white male laying in bed in obvious distress HEENT: head is normocephalic, atraumatic. PERR Heart: tachycardic, regular rhythm, No obvious murmurs, gallops, or rubs noted.  Palpable pedal pulses bilaterally Lungs: splinted, shallow breathing; CTAB, no wheezes, rhonchi, or rales noted.   Abd: rigid, TTP, midline laparotomy wound dehiscence, No BS, +peritonitis MS: all 4 extremities are symmetrical Skin: warm and dry; no rashes Psych: A&Ox3 Neuro: A&Ox3, moves all extremities spontaneously, normal speech  Results for orders placed or performed during the hospital encounter of 02/08/16 (from the past 48 hour(s))  CBC with Differential  Status: Abnormal   Collection Time: 02/08/16  4:12 AM  Result Value Ref Range   WBC 8.1 4.0 - 10.5 K/uL   RBC 4.01 (L) 4.22 - 5.81 MIL/uL   Hemoglobin 10.7 (L) 13.0 - 17.0 g/dL   HCT 31.9 (L) 39.0 - 52.0 %   MCV 79.6 78.0 - 100.0 fL   MCH 26.7 26.0 - 34.0 pg   MCHC 33.5 30.0 - 36.0 g/dL   RDW 14.2 11.5 - 15.5 %   Platelets 563 (H) 150 - 400 K/uL   Neutrophils Relative % 84 %    Lymphocytes Relative 5 %   Monocytes Relative 11 %   Eosinophils Relative 0 %   Basophils Relative 0 %   Neutro Abs 6.8 1.7 - 7.7 K/uL   Lymphs Abs 0.4 (L) 0.7 - 4.0 K/uL   Monocytes Absolute 0.9 0.1 - 1.0 K/uL   Eosinophils Absolute 0.0 0.0 - 0.7 K/uL   Basophils Absolute 0.0 0.0 - 0.1 K/uL   RBC Morphology ELLIPTOCYTES     Comment: TEARDROP CELLS   WBC Morphology INCREASED BANDS (>20% BANDS)     Comment: MILD LEFT SHIFT (1-5% METAS, OCC MYELO, OCC BANDS)  Comprehensive metabolic panel     Status: Abnormal   Collection Time: 02/08/16  4:12 AM  Result Value Ref Range   Sodium 136 135 - 145 mmol/L   Potassium 2.8 (L) 3.5 - 5.1 mmol/L   Chloride 98 (L) 101 - 111 mmol/L   CO2 24 22 - 32 mmol/L   Glucose, Bld 108 (H) 65 - 99 mg/dL   BUN 14 6 - 20 mg/dL   Creatinine, Ser 1.00 0.61 - 1.24 mg/dL   Calcium 7.2 (L) 8.9 - 10.3 mg/dL   Total Protein 4.6 (L) 6.5 - 8.1 g/dL   Albumin 1.5 (L) 3.5 - 5.0 g/dL   AST 34 15 - 41 U/L   ALT 17 17 - 63 U/L   Alkaline Phosphatase 147 (H) 38 - 126 U/L   Total Bilirubin 0.8 0.3 - 1.2 mg/dL   GFR calc non Af Amer >60 >60 mL/min   GFR calc Af Amer >60 >60 mL/min    Comment: (NOTE) The eGFR has been calculated using the CKD EPI equation. This calculation has not been validated in all clinical situations. eGFR's persistently <60 mL/min signify possible Chronic Kidney Disease.    Anion gap 14 5 - 15  Lipase, blood     Status: None   Collection Time: 02/08/16  4:12 AM  Result Value Ref Range   Lipase 16 11 - 51 U/L  I-Stat CG4 Lactic Acid, ED     Status: Abnormal   Collection Time: 02/08/16  4:16 AM  Result Value Ref Range   Lactic Acid, Venous 6.32 (HH) 0.5 - 1.9 mmol/L   Comment NOTIFIED PHYSICIAN   I-Stat CG4 Lactic Acid, ED     Status: Abnormal   Collection Time: 02/08/16  6:52 AM  Result Value Ref Range   Lactic Acid, Venous 6.78 (HH) 0.5 - 1.9 mmol/L   Comment NOTIFIED PHYSICIAN    Ct Abdomen Pelvis W Contrast  Result Date:  02/08/2016 CLINICAL DATA:  Recent bowel perforation, severe abdominal pain beginning 1-1/2 days ago. Follow-up probable bowel obstruction. Elevated lactate. EXAM: CT ABDOMEN AND PELVIS WITH CONTRAST TECHNIQUE: Multidetector CT imaging of the abdomen and pelvis was performed using the standard protocol following bolus administration of intravenous contrast. CONTRAST:  133m ISOVUE-300 IOPAMIDOL (ISOVUE-300) INJECTION 61% COMPARISON:  Abdominal radiograph February 08, 2016  at 0411 hours FINDINGS: LOWER CHEST: Bilateral lower lobe dependent atelectasis with small LEFT pleural effusion. LEFT lung base pneumatocele wall versus bulla. Heart size is normal. No pericardial effusions. HEPATOBILIARY: Mild periportal edema. Wedge-like hypodensity dome of the liver which persist on delayed phase. Sub cm probable cyst LEFT lobe of the liver. 17 mm hypodensity LEFT lobe of the liver, persists on delayed phase. Distended gallbladder. PANCREAS: Atrophic, otherwise unremarkable. SPLEEN: Normal. ADRENALS/URINARY TRACT: Kidneys are orthotopic, demonstrating symmetric enhancement. Focal RIGHT upper pole renal scarring. No nephrolithiasis, hydronephrosis or solid renal masses. The unopacified ureters are normal in course and caliber. Delayed imaging through the kidneys demonstrates symmetric prompt contrast excretion within the proximal urinary collecting system. Urinary bladder is partially distended and unremarkable. Normal adrenal glands. STOMACH/BOWEL: Multiple loops of fluid-filled small bowel distended a 4.2 cm with mural edema, air-fluid levels. No pneumatosis. Multiple transition points. Decompressed large bowel. VASCULAR/LYMPHATIC: Aortoiliac vessels are normal in course and caliber, mild calcific atherosclerosis. Mildly flattened inferior vena cava. Retroperitoneal lymphadenopathy measuring up to 16 mm. Porta hepatis/portacaval lymphadenopathy 14 mm short access. REPRODUCTIVE: Normal. Moderate ascites. Superimposed peritoneal  enhancement. Irregular 16 x 12 cm fluid collection in the pelvis with rim enhancement. MUSCULOSKELETAL: Mild anasarca. Anterior abdominal wall laparotomy dehiscence. Status post L3-4 and L4-5 PLIF, partially incorporated interbody fusion material. Lower thoracic fusion. IMPRESSION: Moderate tense ascites with suspected peritonitis. Irregular 16 x 12 cm early pelvic abscess. Edematous dilated small bowel favoring ileus ; given elevated lactate, early ischemia not excluded. Wedge-like hypodensity in dome of the liver with additional hepatic hypodensities, these could represent infection or, possible ischemia in the dome of the liver. Recent laparotomy with anterior abdominal wall dehiscence. Acute findings discussed with and reconfirmed by Dr.COURTNEY HORTON on 02/08/2016 at 6:58 am. Electronically Signed   By: Elon Alas M.D.   On: 02/08/2016 07:00   Dg Abdomen Acute W/chest  Result Date: 02/08/2016 CLINICAL DATA:  Abdominal pain, recent bowel perforation. EXAM: DG ABDOMEN ACUTE W/ 1V CHEST COMPARISON:  Acute abdominal series January 27, 2016 FINDINGS: Cardiomediastinal silhouette is normal. Blunting of LEFT costophrenic angle with strandy densities. No pneumothorax. Mid thoracic fusion hardware. No intraperitoneal free air. Paucity of large bowel gas with scattered small bowel air-fluid levels, dilated small bowel to at least 4 cm. No intra-abdominal mass effect or pathologic calcifications. LEFT femoral neck probable bone island. L3-4 and L4-5 disc prosthesis. IMPRESSION: Small LEFT pleural effusion and atelectasis. Scattered air-fluid levels with paucity of large bowel gas concerning for early or partial small bowel obstruction. Electronically Signed   By: Elon Alas M.D.   On: 02/08/2016 05:09      Assessment/Plan Abdominal pain Peritonitis Free intra-abdominal fluid S/p exploratory laparotomy and ileocecectomy, Dr. Greer Pickerel 01/28/16 - emergent exploratory laparotomy and possible  bowel resection today by Dr. Judeth Horn  Sepsis secondary to intraabdominal infection  Lactic acidosis  Hypotension  - appreciate CCM evaluation and management  - ID: started on IV imipenem, vancomycin, and Eraxis     Jill Alexanders, [facility] 02/08/2016, 7:39 AM Pager: [phone] Consults: [phone] Mon-Fri 7:00 am-4:30 pm Sat-Sun 7:00 am-11:30 am

## 2016-02-08 NOTE — ED Notes (Signed)
Dr Wyatt at bedside.  

## 2016-02-08 NOTE — Anesthesia Postprocedure Evaluation (Signed)
Anesthesia Post Note  Patient: [name]  Procedure(s) Performed: Procedure(s) (LRB): EXPLORATORY LAPAROTOMY (N/A) APPLICATION OF WOUND VAC (N/A) ILEOCECETOMY (N/A)  Patient location during evaluation: SICU Anesthesia Type: General Level of consciousness: sedated Pain management: pain level controlled Vital Signs Assessment: post-procedure vital signs reviewed and stable Respiratory status: patient remains intubated per anesthesia plan Cardiovascular status: stable Anesthetic complications: no    Last Vitals:  Vitals:   02/08/16 1006 02/08/16 1207  BP: 103/60 123/70  Pulse: 110 (!) 108  Resp: 20 16  Temp:      Last Pain:  Vitals:   02/08/16 0820  TempSrc:   PainSc: 10-Worst pain ever                 Montez Hageman

## 2016-02-08 NOTE — Progress Notes (Signed)
eLink Physician-Brief Progress Note Patient Name: [name] DOB: 1965-07-31 MRN: [id number]   Date of Service  02/08/2016  HPI/Events of Note  --Drop in calcium.  --Continued hypotension with increasing tachycardia.   eICU Interventions  --IV mag and calcium.  --Check ionized calcium.  --1L bolus.      Intervention Category Major Interventions: Hypotension - evaluation and management  Laverle Hobby 02/08/2016, 9:41 PM

## 2016-02-08 NOTE — Consult Note (Signed)
PULMONARY / CRITICAL CARE MEDICINE   Name: [name] MRN: [id number] DOB: [birth year]    ADMISSION DATE:  02/08/2016 CONSULTATION DATE:  02/08/16  REFERRING MD:  ED  CHIEF COMPLAINT:  Abdominal pain  HISTORY OF PRESENT ILLNESS:   [name] is a 50 year old white male with a past medical history of small bowel perforation on 01/27/16 s/p resection with subsequent wound dehisence. He was d/c on 02/02/16. He was tolerating his recovery with adequate intake and normal bowel movements. 12 hours prior to ED presentation, he states he has nausea and vomiting and diffuse unrelenting abdominal pain. He tried pain medication without relief but has a long history of narcotic dependence.   ED course: On presentation, pt was afebrile and hypotensive with BP 86/49. Lactic acid 6.32. WBC wnl. Sepsis protocol was initiated. Pt received fluid resuscitation and zosyn. CT scan showed ascites with suspected peritonitis and an irregular 16 x 12 cm early pelvic abscess with edematous dilated small bowel favoring ileus. Surgery was consulted and plan emergent exploratory laparotomy and possible bowel resection today by Dr. Judeth Horn.  Due to hypotension and sepsis, CCM was consulted. Due to inability to adequately control pain and plans for urgent surgery, the decision was made to intubate and sedate the patient in the ED. A right IJ was placed to monitor CVP and for access.   PAST MEDICAL HISTORY :  He  has a past medical history of Asthma; Complication of anesthesia; Pneumonia; and Small bowel perforation ([place]) (01/2016).  PAST SURGICAL HISTORY: He  has a past surgical history that includes Back surgery; Exploratory laparotomy (01/27/2016); laparotomy (N/A, 01/27/2016); and Bowel resection (N/A, 01/27/2016).  Allergies  Allergen Reactions  . Celecoxib     Other reaction(s): IRRITABILITY  . Clonazepam Itching  . Cyclobenzaprine     Other reaction(s): IRRITABILITY  . Dexamethasone     Other  reaction(s): IRRITABILITY  . Diazepam     Other reaction(s): IRRITABILITY  . Etodolac     Other reaction(s): IRRITABILITY  . Gabapentin     Other reaction(s): IRRITABILITY  . Metaxalone     Other reaction(s): IRRITABILITY  . Methocarbamol     Other reaction(s): IRRITABILITY  . Pregabalin     Other reaction(s): IRRITABILITY  . Propoxyphene     Other reaction(s): IRRITABILITY  . Tramadol     Other reaction(s): IRRITABILITY    No current facility-administered medications on file prior to encounter.    Current Outpatient Prescriptions on File Prior to Encounter  Medication Sig  . ALPRAZolam (XANAX) 0.5 MG tablet Take 0.5 mg by mouth 3 (three) times daily as needed for anxiety.   . Diphenhydramine-APAP, sleep, (GOODY PM PO) Take 1 Package by mouth daily as needed (sleep/pain).  Marland Kitchen oxymorphone (OPANA ER) 40 MG 12 hr tablet Take 40 mg by mouth 3 (three) times daily.    FAMILY HISTORY:  His has no family status information on file.    SOCIAL HISTORY: He  reports that he has been smoking Cigarettes.  He has a 7.50 pack-year smoking history. He has never used smokeless tobacco. He reports that he does not drink alcohol or use drugs.  REVIEW OF SYSTEMS:   Negative otherwise states in HPI  SUBJECTIVE:  Severe pain Borderline BP   VITAL SIGNS: BP 91/77   Pulse 112   Temp 97.5 F (36.4 C) (Oral)   Resp 21   Ht 6' (1.829 m)   Wt 71.7 kg (158 lb)   SpO2 100%  BMI 21.43 kg/m   HEMODYNAMICS:    VENTILATOR SETTINGS: Vent Mode: PRVC FiO2 (%):  [100 %] 100 % Set Rate:  [20 bmp] 20 bmp Vt Set:  RW:212346 mL] 620 mL PEEP:  [5 cmH20] 5 cmH20 Plateau Pressure:  [15 cmH20] 15 cmH20  INTAKE / OUTPUT: I/O last 3 completed shifts: In: 3350 [IV Piggyback:3350] Out: -   PHYSICAL EXAMINATION: General:  Laying in bed in clear distress, thin/malnourished Neuro:  CN II-VII grossly intact, alert and oriented HEENT:  PEERL, EOMI, head normcephalic atraumatic, trachea midline, no JVD,  no thyromegaly or lymphadenopathy, no teeth present Cardiovascular:  Tachycardic, regular rhythm, s1/s1, no mrg Lungs:  CTAB, symmetric but shallow chest wall motion, no rales, wheeze, rhonchi Abdomen: Tender, mildly distended and tense. Midline open surgical wound, no signs of incision infection noted externally. +peritoneal signs   Musculoskeletal:  Equal strength bilaterally in all 4 extremities Skin:  Warm, dry, many tattoos   LABS:  BMET  Recent Labs Lab 02/08/16 0412  NA 136  K 2.8*  CL 98*  CO2 24  BUN 14  CREATININE 1.00  GLUCOSE 108*    Electrolytes  Recent Labs Lab 02/08/16 0412  CALCIUM 7.2*    CBC  Recent Labs Lab 02/08/16 0412  WBC 8.1  HGB 10.7*  HCT 31.9*  PLT 563*    Coag's No results for input(s): APTT, INR in the last 168 hours.  Sepsis Markers  Recent Labs Lab 02/08/16 0416 02/08/16 0652  LATICACIDVEN 6.32* 6.78*    ABG No results for input(s): PHART, PCO2ART, PO2ART in the last 168 hours.  Liver Enzymes  Recent Labs Lab 02/08/16 0412  AST 34  ALT 17  ALKPHOS 147*  BILITOT 0.8  ALBUMIN 1.5*    Cardiac Enzymes No results for input(s): TROPONINI, PROBNP in the last 168 hours.  Glucose  Recent Labs Lab 02/01/16 1946  GLUCAP 86    Imaging Ct Abdomen Pelvis W Contrast  Result Date: 02/08/2016 CLINICAL DATA:  Recent bowel perforation, severe abdominal pain beginning 1-1/2 days ago. Follow-up probable bowel obstruction. Elevated lactate. EXAM: CT ABDOMEN AND PELVIS WITH CONTRAST TECHNIQUE: Multidetector CT imaging of the abdomen and pelvis was performed using the standard protocol following bolus administration of intravenous contrast. CONTRAST:  148mL ISOVUE-300 IOPAMIDOL (ISOVUE-300) INJECTION 61% COMPARISON:  Abdominal radiograph February 08, 2016 at 0411 hours FINDINGS: LOWER CHEST: Bilateral lower lobe dependent atelectasis with small LEFT pleural effusion. LEFT lung base pneumatocele wall versus bulla. Heart size is  normal. No pericardial effusions. HEPATOBILIARY: Mild periportal edema. Wedge-like hypodensity dome of the liver which persist on delayed phase. Sub cm probable cyst LEFT lobe of the liver. 17 mm hypodensity LEFT lobe of the liver, persists on delayed phase. Distended gallbladder. PANCREAS: Atrophic, otherwise unremarkable. SPLEEN: Normal. ADRENALS/URINARY TRACT: Kidneys are orthotopic, demonstrating symmetric enhancement. Focal RIGHT upper pole renal scarring. No nephrolithiasis, hydronephrosis or solid renal masses. The unopacified ureters are normal in course and caliber. Delayed imaging through the kidneys demonstrates symmetric prompt contrast excretion within the proximal urinary collecting system. Urinary bladder is partially distended and unremarkable. Normal adrenal glands. STOMACH/BOWEL: Multiple loops of fluid-filled small bowel distended a 4.2 cm with mural edema, air-fluid levels. No pneumatosis. Multiple transition points. Decompressed large bowel. VASCULAR/LYMPHATIC: Aortoiliac vessels are normal in course and caliber, mild calcific atherosclerosis. Mildly flattened inferior vena cava. Retroperitoneal lymphadenopathy measuring up to 16 mm. Porta hepatis/portacaval lymphadenopathy 14 mm short access. REPRODUCTIVE: Normal. Moderate ascites. Superimposed peritoneal enhancement. Irregular 16 x 12 cm fluid collection  in the pelvis with rim enhancement. MUSCULOSKELETAL: Mild anasarca. Anterior abdominal wall laparotomy dehiscence. Status post L3-4 and L4-5 PLIF, partially incorporated interbody fusion material. Lower thoracic fusion. IMPRESSION: Moderate tense ascites with suspected peritonitis. Irregular 16 x 12 cm early pelvic abscess. Edematous dilated small bowel favoring ileus ; given elevated lactate, early ischemia not excluded. Wedge-like hypodensity in dome of the liver with additional hepatic hypodensities, these could represent infection or, possible ischemia in the dome of the liver. Recent  laparotomy with anterior abdominal wall dehiscence. Acute findings discussed with and reconfirmed by Dr.COURTNEY HORTON on 02/08/2016 at 6:58 am. Electronically Signed   By: Elon Alas M.D.   On: 02/08/2016 07:00   Dg Abdomen Acute W/chest  Result Date: 02/08/2016 CLINICAL DATA:  Abdominal pain, recent bowel perforation. EXAM: DG ABDOMEN ACUTE W/ 1V CHEST COMPARISON:  Acute abdominal series January 27, 2016 FINDINGS: Cardiomediastinal silhouette is normal. Blunting of LEFT costophrenic angle with strandy densities. No pneumothorax. Mid thoracic fusion hardware. No intraperitoneal free air. Paucity of large bowel gas with scattered small bowel air-fluid levels, dilated small bowel to at least 4 cm. No intra-abdominal mass effect or pathologic calcifications. LEFT femoral neck probable bone island. L3-4 and L4-5 disc prosthesis. IMPRESSION: Small LEFT pleural effusion and atelectasis. Scattered air-fluid levels with paucity of large bowel gas concerning for early or partial small bowel obstruction. Electronically Signed   By: Elon Alas M.D.   On: 02/08/2016 05:09     STUDIES:  CT abd/pelvis 10/25-  >>  ascites with suspected peritonitis. Irregular 16 x 12 cm early pelvic abscess. Edematous dilated small bowel favoring ileus. Wedge-like hypodensity in dome of the liver with additional hepatic hypodensities  XR chest/abdomen 10/25 >> Small LEFT pleural effusion and atelectasis. Scattered air-fluid levels with paucity of large bowel gas concerning for early or partial small bowel obstruction  CULTURES: Blood cultures drawn in ED, pending  ANTIBIOTICS: Zosyn given on arrival to ED, changed to vanc, imipenem, and anidulafungin, start date 02/08/16  SIGNIFICANT EVENTS: Code sepsis called in ED Intubated and central access obtained 02/08/16 Plan for exploratory surgery 02/08/16  LINES/TUBES: ETT 02/08/16 Right IJ 101/25/17  DISCUSSION: This is a 50 year old male with suspected  ischemic bowel, plans to go to OR today for exploration. He appears to be septic. CCM was consulted and obtained central venous access and intubated pt in ED. Abx were broadened to cover for nosocomial infection and pseudomonas d/t pt recent hospitalization. We will continue to follow.   ASSESSMENT / PLAN:  PULMONARY A: Pt intubated for support of pain control and planned OR exploration today P:   Will plan to extubate once surgery complete  CARDIOVASCULAR A:  Tachycardia hypotension P:  Pt likely tachycardic due to pain, will continue to monitor and control pain S/p 3,350 L NS with hypotension persisting, Levophed ordered for pressure support CVP 10-12  RENAL A:   AKI P:   Pt with Cr 1.00, Baseline 0.54. Likely due to decresed perfusion. Will continue to hydrate pt and follow labs.  GASTROINTESTINAL A:   Suspected ischemic bowel P:   Pt to go to OR for exploratory surgery today  HEMATOLOGIC A:   No evidence of active bleeding HBG 10.7  P: Slightly improved compared to hbg of 10.3 on 10/15  Type and cross   INFECTIOUS A:   Sepsis, open abdominal wound, recent hospitalization P:   Abx broadened to cover for nosocomial infection and pseudomonas. Currently on vanc, imipenem and eraxis   ENDOCRINE  A:   No acute issue at this time   P:   Monitor blood glucose with daily labs Cortisol ordered  NEUROLOGIC A:   Pt sedated and intubated P:   RASS goal: -3, -4    FAMILY  - Updates: Pt wife at bedside, situation thoroughly explained to both her and Mr. Chavez before intubation. Both consented. Wife updated post procedures and remains close. Other family members were arriving as we left the room.   - Inter-disciplinary family meet or Palliative Care meeting due by:  02/14/16 Quin Hoop, MS4 Attending: Dr. Titus Mould  Pulmonary and [place] Pager: [phone]  02/08/2016, 9:08 AM   STAFF NOTE: I, Merrie Roof, MD FACP  have personally reviewed patient's available data, including medical history, events of note, physical examination and test results as part of my evaluation. I have discussed with resident/NP and other care providers such as pharmacist, RN and RRT. In addition, I personally evaluated patient and elicited key findings of:  In ER noted severe pain, increase RR, wound open, abdo distention, guarding and rebound, lungs clear, JVD low, CT c/w abscess, OGT with bloody output, concern associated dead bowel / perf?, placed ETT as we are inable to control his pain, after ett can sedate heavy and treat pain aggressive and needs ett for OR, place line stat, get cvp, sepsis code was called, received 30 cc/kg per EDP, get cortisol,. I performed repeat assessment for sepsis, lactic up as this I s a source control problem and will go to OR ASAP, cvp goal 12, get abg on vent, pcxr post ETT, may need higher MV with lactic noted, add empiric antifungal and vanc for enteroccus to zosyn, I updated wife at bedside on multiple occassions, levophed started in room to MAP 65, appears still to need more volume, await cvp The patient is critically ill with multiple organ systems failure and requires high complexity decision making for assessment and support, frequent evaluation and titration of therapies, application of advanced monitoring technologies and extensive interpretation of multiple databases.   Critical Care Time devoted to patient care services described in this note is 90 Minutes. This time reflects time of care of this signee: Merrie Roof, MD FACP. This critical care time does not reflect procedure time, or teaching time or supervisory time of PA/NP/Med student/Med Resident etc but could involve care discussion time. Rest per NP/medical resident whose note is outlined above and that I agree with   Lavon Paganini. Titus Mould, MD, [place] Pgr: [facility] 02/08/2016 10:34 AM

## 2016-02-08 NOTE — ED Provider Notes (Signed)
[facility] DEPT Provider Note   CSN: [id number] Arrival date & time: 02/08/16  0353     History   Chief Complaint Chief Complaint  Patient presents with  . Abdominal Pain    HPI [name] is a 50 y.o. male.  HPI  This is a 50 year old male who presents with abdominal pain. Recent history of small bowel perforation on 01/27/16. He had a small bowel resection and subsequently had wound dehiscence. He was discharged on 02/02/16. Patient reports that he has home health. He has been tolerating fluids. Reports nausea without vomiting. Reports normal bowel movements. Over the last 12 hours he has had worsening diffuse abdominal pain which is sharp at times. Currently his pain is 8 out of 10. He has taken pain medication at home with minimal relief. Denies fevers.  Past Medical History:  Diagnosis Date  . Asthma   . Complication of anesthesia    " I got confused "  . Pneumonia   . Small bowel perforation ([place]) 01/2016    Patient Active Problem List   Diagnosis Date Noted  . Small bowel perforation ([place]) 01/27/2016    Past Surgical History:  Procedure Laterality Date  . BACK SURGERY    . BOWEL RESECTION N/A 01/27/2016   Procedure: SMALL BOWEL RESECTION;  Surgeon: Greer Pickerel, MD;  Location: [facility];  Service: General;  Laterality: N/A;  . EXPLORATORY LAPAROTOMY  01/27/2016  . LAPAROTOMY N/A 01/27/2016   Procedure: EXPLORATORY LAPAROTOMY;  Surgeon: Greer Pickerel, MD;  Location: [facility];  Service: General;  Laterality: N/A;       Home Medications    Prior to Admission medications   Medication Sig Start Date End Date Taking? Authorizing Provider  ALPRAZolam Duanne Moron) 0.5 MG tablet Take 0.5 mg by mouth 3 (three) times daily as needed for anxiety.    Yes Historical Provider, MD  Diphenhydramine-APAP, sleep, (GOODY PM PO) Take 1 Package by mouth daily as needed (sleep/pain).   Yes Historical Provider, MD  oxymorphone (OPANA ER) 40 MG 12 hr tablet Take 40 mg by mouth 3 (three)  times daily.   Yes Historical Provider, MD    Family History No family history on file.  Social History Social History  Substance Use Topics  . Smoking status: Current Every Day Smoker    Packs/day: 0.50    Years: 15.00    Types: Cigarettes  . Smokeless tobacco: Never Used  . Alcohol use No     Allergies   Celecoxib; Clonazepam; Cyclobenzaprine; Dexamethasone; Diazepam; Etodolac; Gabapentin; Metaxalone; Methocarbamol; Pregabalin; Propoxyphene; and Tramadol   Review of Systems Review of Systems  Constitutional: Negative for fever.  Respiratory: Negative for shortness of breath.   Cardiovascular: Negative for chest pain.  Gastrointestinal: Positive for abdominal pain and nausea. Negative for constipation, diarrhea and vomiting.  Genitourinary: Negative for dysuria.  All other systems reviewed and are negative.    Physical Exam Updated Vital Signs BP 95/58 (BP Location: Right Arm)   Pulse 106   Temp 97.5 F (36.4 C) (Oral)   Resp 24   Ht 5\' 11"  (1.803 m)   Wt 158 lb (71.7 kg)   SpO2 97%   BMI 22.04 kg/m   Physical Exam  Constitutional: He is oriented to person, place, and time.  Uncomfortable appearing, moaning upon entry to the room  HENT:  Head: Normocephalic and atraumatic.  Cardiovascular: Normal rate, regular rhythm and normal heart sounds.   No murmur heard. Pulmonary/Chest: Effort normal and breath sounds normal. No respiratory  distress. He has no wheezes.  Abdominal: Soft. Bowel sounds are normal. He exhibits distension. There is tenderness. There is rebound.  Mild distention, hypoactive bowel sounds, diffuse tenderness to palpation with voluntary guarding, large midline abdominal incision with evidence of dehiscent and gaping, granulation tissue present, moderate amount of drainage noted inferiorly  Musculoskeletal: He exhibits no edema.  Neurological: He is alert and oriented to person, place, and time.  Skin: Skin is warm and dry.  Psychiatric: He  has a normal mood and affect.  Nursing note and vitals reviewed.    ED Treatments / Results  Labs (all labs ordered are listed, but only abnormal results are displayed) Labs Reviewed  CBC WITH DIFFERENTIAL/PLATELET - Abnormal; Notable for the following:       Result Value   RBC 4.01 (*)    Hemoglobin 10.7 (*)    HCT 31.9 (*)    Platelets 563 (*)    Lymphs Abs 0.4 (*)    All other components within normal limits  COMPREHENSIVE METABOLIC PANEL - Abnormal; Notable for the following:    Potassium 2.8 (*)    Chloride 98 (*)    Glucose, Bld 108 (*)    Calcium 7.2 (*)    Total Protein 4.6 (*)    Albumin 1.5 (*)    Alkaline Phosphatase 147 (*)    All other components within normal limits  I-STAT CG4 LACTIC ACID, ED - Abnormal; Notable for the following:    Lactic Acid, Venous 6.32 (*)    All other components within normal limits  I-STAT CG4 LACTIC ACID, ED - Abnormal; Notable for the following:    Lactic Acid, Venous 6.78 (*)    All other components within normal limits  CULTURE, BLOOD (ROUTINE X 2)  CULTURE, BLOOD (ROUTINE X 2)  URINE CULTURE  LIPASE, BLOOD  URINALYSIS, ROUTINE W REFLEX MICROSCOPIC (NOT AT [facility])    EKG  EKG Interpretation None       Radiology Ct Abdomen Pelvis W Contrast  Result Date: 02/08/2016 CLINICAL DATA:  Recent bowel perforation, severe abdominal pain beginning 1-1/2 days ago. Follow-up probable bowel obstruction. Elevated lactate. EXAM: CT ABDOMEN AND PELVIS WITH CONTRAST TECHNIQUE: Multidetector CT imaging of the abdomen and pelvis was performed using the standard protocol following bolus administration of intravenous contrast. CONTRAST:  165mL ISOVUE-300 IOPAMIDOL (ISOVUE-300) INJECTION 61% COMPARISON:  Abdominal radiograph February 08, 2016 at 0411 hours FINDINGS: LOWER CHEST: Bilateral lower lobe dependent atelectasis with small LEFT pleural effusion. LEFT lung base pneumatocele wall versus bulla. Heart size is normal. No pericardial effusions.  HEPATOBILIARY: Mild periportal edema. Wedge-like hypodensity dome of the liver which persist on delayed phase. Sub cm probable cyst LEFT lobe of the liver. 17 mm hypodensity LEFT lobe of the liver, persists on delayed phase. Distended gallbladder. PANCREAS: Atrophic, otherwise unremarkable. SPLEEN: Normal. ADRENALS/URINARY TRACT: Kidneys are orthotopic, demonstrating symmetric enhancement. Focal RIGHT upper pole renal scarring. No nephrolithiasis, hydronephrosis or solid renal masses. The unopacified ureters are normal in course and caliber. Delayed imaging through the kidneys demonstrates symmetric prompt contrast excretion within the proximal urinary collecting system. Urinary bladder is partially distended and unremarkable. Normal adrenal glands. STOMACH/BOWEL: Multiple loops of fluid-filled small bowel distended a 4.2 cm with mural edema, air-fluid levels. No pneumatosis. Multiple transition points. Decompressed large bowel. VASCULAR/LYMPHATIC: Aortoiliac vessels are normal in course and caliber, mild calcific atherosclerosis. Mildly flattened inferior vena cava. Retroperitoneal lymphadenopathy measuring up to 16 mm. Porta hepatis/portacaval lymphadenopathy 14 mm short access. REPRODUCTIVE: Normal. Moderate ascites. Superimposed peritoneal  enhancement. Irregular 16 x 12 cm fluid collection in the pelvis with rim enhancement. MUSCULOSKELETAL: Mild anasarca. Anterior abdominal wall laparotomy dehiscence. Status post L3-4 and L4-5 PLIF, partially incorporated interbody fusion material. Lower thoracic fusion. IMPRESSION: Moderate tense ascites with suspected peritonitis. Irregular 16 x 12 cm early pelvic abscess. Edematous dilated small bowel favoring ileus ; given elevated lactate, early ischemia not excluded. Wedge-like hypodensity in dome of the liver with additional hepatic hypodensities, these could represent infection or, possible ischemia in the dome of the liver. Recent laparotomy with anterior abdominal  wall dehiscence. Acute findings discussed with and reconfirmed by Dr.COURTNEY HORTON on 02/08/2016 at 6:58 am. Electronically Signed   By: Elon Alas M.D.   On: 02/08/2016 07:00   Dg Abdomen Acute W/chest  Result Date: 02/08/2016 CLINICAL DATA:  Abdominal pain, recent bowel perforation. EXAM: DG ABDOMEN ACUTE W/ 1V CHEST COMPARISON:  Acute abdominal series January 27, 2016 FINDINGS: Cardiomediastinal silhouette is normal. Blunting of LEFT costophrenic angle with strandy densities. No pneumothorax. Mid thoracic fusion hardware. No intraperitoneal free air. Paucity of large bowel gas with scattered small bowel air-fluid levels, dilated small bowel to at least 4 cm. No intra-abdominal mass effect or pathologic calcifications. LEFT femoral neck probable bone island. L3-4 and L4-5 disc prosthesis. IMPRESSION: Small LEFT pleural effusion and atelectasis. Scattered air-fluid levels with paucity of large bowel gas concerning for early or partial small bowel obstruction. Electronically Signed   By: Elon Alas M.D.   On: 02/08/2016 05:09    Procedures Procedures (including critical care time)  CRITICAL CARE Performed by: Merryl Hacker   Total critical care time: 45 minutes  Critical care time was exclusive of separately billable procedures and treating other patients.  Critical care was necessary to treat or prevent imminent or life-threatening deterioration.  Critical care was time spent personally by me on the following activities: development of treatment plan with patient and/or surrogate as well as nursing, discussions with consultants, evaluation of patient's response to treatment, examination of patient, obtaining history from patient or surrogate, ordering and performing treatments and interventions, ordering and review of laboratory studies, ordering and review of radiographic studies, pulse oximetry and re-evaluation of patient's condition.   Medications Ordered in  ED Medications  piperacillin-tazobactam (ZOSYN) IVPB 3.375 g (not administered)  iopamidol (ISOVUE-300) 61 % injection (not administered)  iopamidol (ISOVUE-300) 61 % injection (not administered)  potassium chloride 10 mEq in 100 mL IVPB (10 mEq Intravenous New Bag/Given 02/08/16 0653)  HYDROmorphone (DILAUDID) injection 1 mg (1 mg Intravenous Given 02/08/16 0445)  ondansetron (ZOFRAN) injection 4 mg (4 mg Intravenous Given 02/08/16 0445)  sodium chloride 0.9 % bolus 1,000 mL (0 mLs Intravenous Stopped 02/08/16 0613)  sodium chloride 0.9 % bolus 1,000 mL (0 mLs Intravenous Stopped 02/08/16 0613)    And  sodium chloride 0.9 % bolus 1,000 mL (0 mLs Intravenous Stopped 02/08/16 0532)    And  sodium chloride 0.9 % bolus 250 mL (0 mLs Intravenous Stopped 02/08/16 0626)  piperacillin-tazobactam (ZOSYN) IVPB 3.375 g (0 g Intravenous Stopped 02/08/16 0532)  HYDROmorphone (DILAUDID) injection 1 mg (1 mg Intravenous Given 02/08/16 0623)  iopamidol (ISOVUE-300) 61 % injection 100 mL (100 mLs Intravenous Contrast Given 02/08/16 0628)  HYDROmorphone (DILAUDID) injection 2 mg (2 mg Intravenous Given 02/08/16 0703)     Initial Impression / Assessment and Plan / ED Course  I have reviewed the triage vital signs and the nursing notes.  Pertinent labs & imaging results that were available during my care  of the patient were reviewed by me and considered in my medical decision making (see chart for details).  Clinical Course  Comment By Time  Patient presents with abdominal pain. Recent small bowel perforation. Uncomfortable and signs of peritonitis on exam. Initial lactate 6.32. Discussed with Dr. Georgette Dover given exam and lactate. Will resuscitate and obtain plain films initially. Merryl Hacker, MD 10/25 [phone]    Patient presents with worsening abdominal pain. Recent conjugated hospital course including a perforated viscus, small bowel resection, and wound dehiscence. He has some signs of peritonitis on  exam and is very uncomfortable. Lab work was obtained. Lactate is 6. Sepsis workup initiated. Patient was given a total of 3200 mL of fluid. He was given Zosyn based on likely intra-abdominal infection. I did discuss the patient early in his course with Dr. Georgette Dover who is requesting imaging. Plain films show no evidence of free air. CT scan obtained and shows ascites with peritonitis and likely abscess. Patient was given multiple doses of pain medication. He continues to be in significant pain.  Discussed again Dr. Georgette Dover who will have the patient evaluated.  Sepsis - Repeat Assessment  Performed at:    7:23 AM  Vitals     Blood pressure 95/58, pulse 106, temperature 97.5 F (36.4 C), temperature source Oral, resp. rate 24, height 5\' 11"  (1.803 m), weight 158 lb (71.7 kg), SpO2 97 %.  Heart:     Tachycardic  Lungs:    CTA  Capillary Refill:   <2 sec  Peripheral Pulse:   Radial pulse palpable  Skin:     Pale     Final Clinical Impressions(s) / ED Diagnoses   Final diagnoses:  Peritonitis ([place])  Abdominal infection ([place])    New Prescriptions New Prescriptions   No medications on file     Merryl Hacker, MD 02/08/16 [phone]

## 2016-02-08 NOTE — Procedures (Signed)
Intubation Procedure Note AARYAV SCHIFFLER [id number] 1965/07/29  Procedure: Intubation Indications: intracrtable pain  Procedure Details Consent: Risks of procedure as well as the alternatives and risks of each were explained to the (patient/caregiver).  Consent for procedure obtained. Time Out: Verified patient identification, verified procedure, site/side was marked, verified correct patient position, special equipment/implants available, medications/allergies/relevent history reviewed, required imaging and test results available.  Performed  Maximum sterile technique was used including antiseptics, cap, gloves, gown, hand hygiene, mask and sheet.  4 mac    Evaluation Hemodynamic Status: BP stable throughout; O2 sats: stable throughout [name] Current Condition: stable Complications: No apparent complications Patient did tolerate procedure well. Chest X-ray ordered to verify placement.  CXR: pending.   Raylene Miyamoto 02/08/2016

## 2016-02-08 NOTE — Progress Notes (Signed)
Pharmacy Antibiotic Note  [name] is a 50 y.o. male admitted on 02/08/2016 with intra-abdominal infxn.  Pharmacy has been consulted for vancomycin and primaxin dosing. Pt is also receiving eraxis. Pt is afebrile and WBC is WNL. Scr is 1 and lactic acid is significantly elevated at 6.78.  Plan: - Vancomycin 1500mg  IV x 1 then 750mg  IV Q8H - Primaxin 500mg  IV Q6H - F/u renal fxn, C&S, clinical status and trough at SS  Height: 6' (182.9 cm) Weight: 158 lb (71.7 kg) IBW/kg (Calculated) : 77.6  Temp (24hrs), Avg:97.5 F (36.4 C), Min:97.5 F (36.4 C), Max:97.5 F (36.4 C)   Recent Labs Lab 02/08/16 0412 02/08/16 0416 02/08/16 0652  WBC 8.1  --   --   CREATININE 1.00  --   --   LATICACIDVEN  --  6.32* 6.78*    Estimated Creatinine Clearance: 90.6 mL/min (by C-G formula based on SCr of 1 mg/dL).    Allergies  Allergen Reactions  . Celecoxib     Other reaction(s): IRRITABILITY  . Clonazepam Itching  . Cyclobenzaprine     Other reaction(s): IRRITABILITY  . Dexamethasone     Other reaction(s): IRRITABILITY  . Diazepam     Other reaction(s): IRRITABILITY  . Etodolac     Other reaction(s): IRRITABILITY  . Gabapentin     Other reaction(s): IRRITABILITY  . Metaxalone     Other reaction(s): IRRITABILITY  . Methocarbamol     Other reaction(s): IRRITABILITY  . Pregabalin     Other reaction(s): IRRITABILITY  . Propoxyphene     Other reaction(s): IRRITABILITY  . Tramadol     Other reaction(s): IRRITABILITY    Antimicrobials this admission: Vanc 10/25>> Primaxin 10/25>> Eraxis 10/25>> Zosyn x 1 10/25  Dose adjustments this admission: N/A  Microbiology results: Pending  Thank you for allowing pharmacy to be a part of this patient's care.  Caitlyn Buchanan, Rande Lawman 02/08/2016 8:47 AM

## 2016-02-08 NOTE — ED Notes (Signed)
Patient returned from CT

## 2016-02-08 NOTE — Progress Notes (Signed)
Stopped by to visit with pr's family, but they'd already left hospital to rest. They understand pt is very sick. Relayed to nurse that last thing pt said before being intubated was Tell my wife I love her! When wife in hall heard that, she thought his condition was imminently dangerous. She asked the ED nurse to tell him she loved him too. Pt has a loving family who will be involved. Wife appreciated prayer. Chaplain available for follow-up.   02/08/16 1500  Clinical Encounter Type  Visited With Health care provider  Visit Type Follow-up  Referral From Chaplain  Stress Factors  Patient Stress Factors Health changes;Loss of control  Family Stress Factors Family relationships;Health changes;Loss of control

## 2016-02-08 NOTE — Anesthesia Preprocedure Evaluation (Signed)
Anesthesia Evaluation  Patient identified by MRN, date of birth, ID band  Reviewed: Allergy & Precautions, NPO status , [name] Chart, lab work & pertinent test results, Unable to perform ROS - Chart review onlyPreop documentation limited or incomplete due to emergent nature of procedure.  History of Anesthesia Complications (+) history of anesthetic complications ("post-op confusion")  Airway Mallampati: Intubated  TM Distance: >3 FB Neck ROM: Full    Dental  (+) Teeth Intact, Dental Advisory Given   Pulmonary asthma , Current Smoker,    Pulmonary exam normal breath sounds clear to auscultation       Cardiovascular negative cardio ROS Normal cardiovascular exam Rhythm:Regular Rate:Normal     Neuro/Psych negative neurological ROS  negative psych ROS   GI/Hepatic negative GI ROS, Neg liver ROS, Small bowel perforation s/p ex-lap   Endo/Other  negative endocrine ROS  Renal/GU negative Renal ROS     Musculoskeletal negative musculoskeletal ROS (+)   Abdominal   Peds  Hematology  (+) Blood dyscrasia, anemia ,   Anesthesia Other Findings Day of surgery medications reviewed with the patient.  Reproductive/Obstetrics                             Anesthesia Physical Anesthesia Plan  ASA: IV and emergent  Anesthesia Plan: General   Post-op Pain Management:    Induction: Inhalational  Airway Management Planned: Oral ETT  Additional Equipment:   Intra-op Plan:   Post-operative Plan: Post-operative intubation/ventilation  Informed Consent: I have reviewed the patients History and Physical, chart, labs and discussed the procedure including the risks, benefits and alternatives for the proposed anesthesia with the patient or authorized representative who has indicated his/her understanding and acceptance.   Dental advisory given  Plan Discussed with: CRNA  Anesthesia Plan Comments:  (Emergent case brought up from ED. Patient intubated, sedated and on pressors for CV support.  )        Anesthesia Quick Evaluation

## 2016-02-08 NOTE — Progress Notes (Signed)
Pharmacy Antibiotic Note  [name] is a 50 y.o. male admitted on 02/08/2016 with intra-abdominal infection.  Pharmacy has been consulted for Zosyn dosing. S/P ex-lap with ileocecectomy on 10/14. Renal function normal.  Plan: Zosyn 3.375G IV q8h to be infused over 4 hours Trend WBC, temp, renal function  F/U infectious work-up  Height: 5\' 11"  (180.3 cm) Weight: 158 lb (71.7 kg) IBW/kg (Calculated) : 75.3  Temp (24hrs), Avg:97.5 F (36.4 C), Min:97.5 F (36.4 C), Max:97.5 F (36.4 C)   Recent Labs Lab 02/08/16 0416  LATICACIDVEN 6.32*    Estimated Creatinine Clearance: 113.3 mL/min (by C-G formula based on SCr of 0.54 mg/dL (L)).     Narda Bonds 02/08/2016 4:29 AM

## 2016-02-08 NOTE — ED Notes (Signed)
Re paged General Surgery to [place] @[phone] 

## 2016-02-08 NOTE — ED Notes (Signed)
Patient cleaned and warm blankets provided at this time. Family member at bedside

## 2016-02-08 NOTE — Care Management Note (Addendum)
Case Management Note  Patient Details  Name: [name] MRN: [id number] Date of Birth: 10/14/1965  Subjective/Objective:                  50 y/o male with PMH back pain and small bowel perforation s/p ileocecectomy 01/28/16 who presented to [facility] with abdominal pain.  From home with spouse.  Action/Plan: Follow for disposition needs. /Admit to hospital.  [facility] care at discharge it was set up through [employer].   Expected Discharge Date:  02/11/16               Expected Discharge Plan:  [place]  In-House Referral:  NA  Discharge planning Services  CM Consult  Post Acute Care Choice:    Choice offered to:     DME Arranged:    DME Agency:     HH Arranged:    HH Agency:     Status of Service:  In process, will continue to follow  If discussed at Long Length of Stay Meetings, dates discussed:    Additional Comments: [name] states pt is not active with [facility] for [facility] as they can not take Workman's Comp. [name], RN 02/08/2016, 10:04 AM

## 2016-02-08 NOTE — ED Notes (Signed)
Patient transported to CT 

## 2016-02-08 NOTE — Procedures (Signed)
Placed OGT to avoid any aspiration After ett placed noted mild bloody fluid from esoph, No asp Placed OGT easily 200 cc bloody drainage, to suction  pcxr wnl tube  Lavon Paganini. Titus Mould, MD, [place] Pgr: [facility]

## 2016-02-08 NOTE — Progress Notes (Signed)
Responded to page to ED. Pt being intubated, provided emotional/spiritual support and prayer to wife in hall until adult son (CNA) arrived with friend. Surgery is planned. Chaplain available for follow-up.   02/08/16 0900  Clinical Encounter Type  Visited With Family  Visit Type Initial;ED  Referral From Nurse  Spiritual Encounters  Spiritual Needs Prayer;Emotional  Stress Factors  Patient Stress Factors Health changes;Loss of control  Family Stress Factors Family relationships;Health changes;Loss of control

## 2016-02-08 NOTE — ED Triage Notes (Addendum)
Pt in from home via [facility] with abd pain and cramping. Here recently for abd perforation and surgery, was hospitalized x 6 days. Here today for increased abd pain. Denies n/v/d, states he's having normal BM's, tolerating liquids only. Endorses chills, midline incision to abd covered with drsg. BP initially 84/55 for EMS, was given 400cc's NS IV. 103/71 on ED arrival. Alert, states 10/10 pain

## 2016-02-09 ENCOUNTER — Encounter (HOSPITAL_COMMUNITY): Payer: Self-pay | Admitting: General Surgery

## 2016-02-09 DIAGNOSIS — A419 Sepsis, unspecified organism: Secondary | ICD-10-CM

## 2016-02-09 DIAGNOSIS — K659 Peritonitis, unspecified: Secondary | ICD-10-CM

## 2016-02-09 LAB — POCT I-STAT 7, (LYTES, BLD GAS, ICA,H+H)
Acid-base deficit: 1 mmol/L (ref 0.0–2.0)
Bicarbonate: 26.2 mmol/L (ref 20.0–28.0)
Calcium, Ion: 0.95 mmol/L — ABNORMAL LOW (ref 1.15–1.40)
HEMATOCRIT: 27 % — AB (ref 39.0–52.0)
Hemoglobin: 9.2 g/dL — ABNORMAL LOW (ref 13.0–17.0)
O2 SAT: 100 %
PCO2 ART: 52.3 mmHg — AB (ref 32.0–48.0)
PH ART: 7.307 — AB (ref 7.350–7.450)
PO2 ART: 499 mmHg — AB (ref 83.0–108.0)
Potassium: 3 mmol/L — ABNORMAL LOW (ref 3.5–5.1)
Sodium: 138 mmol/L (ref 135–145)
TCO2: 28 mmol/L (ref 0–100)

## 2016-02-09 LAB — COMPREHENSIVE METABOLIC PANEL
ALK PHOS: 97 U/L (ref 38–126)
ALT: 18 U/L (ref 17–63)
AST: 26 U/L (ref 15–41)
Albumin: 1.2 g/dL — ABNORMAL LOW (ref 3.5–5.0)
Anion gap: 7 (ref 5–15)
BILIRUBIN TOTAL: 0.5 mg/dL (ref 0.3–1.2)
BUN: 17 mg/dL (ref 6–20)
CALCIUM: 6.8 mg/dL — AB (ref 8.9–10.3)
CO2: 20 mmol/L — ABNORMAL LOW (ref 22–32)
Chloride: 104 mmol/L (ref 101–111)
Creatinine, Ser: 0.75 mg/dL (ref 0.61–1.24)
GFR calc Af Amer: >60 mL/min (ref >60)
GLUCOSE: 101 mg/dL — AB (ref 65–99)
POTASSIUM: 3.7 mmol/L (ref 3.5–5.1)
Sodium: 131 mmol/L — ABNORMAL LOW (ref 135–145)
TOTAL PROTEIN: 3.8 g/dL — AB (ref 6.5–8.1)

## 2016-02-09 LAB — BLOOD GAS, ARTERIAL
Acid-base deficit: 3.7 mmol/L — ABNORMAL HIGH (ref 0.0–2.0)
Bicarbonate: 20 mmol/L (ref 20.0–28.0)
DRAWN BY: 441371
FIO2: 40
MECHVT: 570 mL
O2 SAT: 98.5 %
PATIENT TEMPERATURE: 98.9
PCO2 ART: 31.3 mmHg — AB (ref 32.0–48.0)
PEEP: 5 cmH2O
PH ART: 7.422 (ref 7.350–7.450)
PO2 ART: 152 mmHg — AB (ref 83.0–108.0)
RATE: 18 resp/min

## 2016-02-09 LAB — CBC WITH DIFFERENTIAL/PLATELET
BASOS PCT: 0 %
Basophils Absolute: 0 10*3/uL (ref 0.0–0.1)
EOS PCT: 0 %
Eosinophils Absolute: 0 10*3/uL (ref 0.0–0.7)
HEMATOCRIT: 27.9 % — AB (ref 39.0–52.0)
HEMOGLOBIN: 9.4 g/dL — AB (ref 13.0–17.0)
LYMPHS ABS: 1.4 10*3/uL (ref 0.7–4.0)
Lymphocytes Relative: 5 %
MCH: 26.6 pg (ref 26.0–34.0)
MCHC: 33.7 g/dL (ref 30.0–36.0)
MCV: 79 fL (ref 78.0–100.0)
MONO ABS: 2.6 10*3/uL — AB (ref 0.1–1.0)
MONOS PCT: 9 %
Neutro Abs: 24.9 10*3/uL — ABNORMAL HIGH (ref 1.7–7.7)
Neutrophils Relative %: 86 %
Platelets: 534 10*3/uL — ABNORMAL HIGH (ref 150–400)
RBC: 3.53 MIL/uL — ABNORMAL LOW (ref 4.22–5.81)
RDW: 14.7 % (ref 11.5–15.5)
WBC: 28.9 10*3/uL — ABNORMAL HIGH (ref 4.0–10.5)

## 2016-02-09 LAB — LACTIC ACID, PLASMA: Lactic Acid, Venous: 2.4 mmol/L (ref 0.5–1.9)

## 2016-02-09 LAB — PROTIME-INR
INR: 1.67
PROTHROMBIN TIME: 19.9 s — AB (ref 11.4–15.2)

## 2016-02-09 MED ORDER — SODIUM CHLORIDE 0.9 % IV SOLN
100.0000 mg | INTRAVENOUS | Status: AC
  Administered 2016-02-10 – 2016-02-15 (×6): 100 mg via INTRAVENOUS
  Filled 2016-02-09 (×6): qty 100

## 2016-02-09 MED ORDER — ORAL CARE MOUTH RINSE
15.0000 mL | OROMUCOSAL | Status: DC
  Administered 2016-02-09 – 2016-02-13 (×20): 15 mL via OROMUCOSAL

## 2016-02-09 MED ORDER — SODIUM CHLORIDE 0.9 % IV SOLN
200.0000 mg | Freq: Once | INTRAVENOUS | Status: AC
  Administered 2016-02-09: 200 mg via INTRAVENOUS
  Filled 2016-02-09 (×2): qty 200

## 2016-02-09 MED ORDER — SODIUM CHLORIDE 0.9 % IV BOLUS (SEPSIS)
1000.0000 mL | Freq: Once | INTRAVENOUS | Status: AC
  Administered 2016-02-09: 1000 mL via INTRAVENOUS

## 2016-02-09 MED ORDER — SODIUM CHLORIDE 0.9 % IV BOLUS (SEPSIS)
500.0000 mL | Freq: Once | INTRAVENOUS | Status: AC
  Administered 2016-02-09: 500 mL via INTRAVENOUS

## 2016-02-09 MED ORDER — ENOXAPARIN SODIUM 30 MG/0.3ML ~~LOC~~ SOLN
30.0000 mg | SUBCUTANEOUS | Status: DC
  Administered 2016-02-09 – 2016-02-13 (×4): 30 mg via SUBCUTANEOUS
  Filled 2016-02-09 (×5): qty 0.3

## 2016-02-09 MED ORDER — VANCOMYCIN HCL IN DEXTROSE 1-5 GM/200ML-% IV SOLN
1000.0000 mg | Freq: Three times a day (TID) | INTRAVENOUS | Status: DC
  Administered 2016-02-09 – 2016-02-10 (×3): 1000 mg via INTRAVENOUS
  Filled 2016-02-09 (×5): qty 200

## 2016-02-09 MED ORDER — SODIUM CHLORIDE 0.9 % IV SOLN
INTRAVENOUS | Status: AC
  Administered 2016-02-09: 11:00:00 via INTRAVENOUS

## 2016-02-09 MED ORDER — SODIUM CHLORIDE 0.9 % IV SOLN
1000.0000 mg | Freq: Three times a day (TID) | INTRAVENOUS | Status: DC
  Administered 2016-02-09 – 2016-02-10 (×3): 1000 mg via INTRAVENOUS
  Filled 2016-02-09 (×5): qty 1000

## 2016-02-09 MED ORDER — PHENYLEPHRINE HCL 10 MG/ML IJ SOLN
20.0000 ug/min | INTRAVENOUS | Status: DC
  Administered 2016-02-09: 70 ug/min via INTRAVENOUS
  Filled 2016-02-09: qty 4

## 2016-02-09 NOTE — Progress Notes (Addendum)
Initial Nutrition Assessment  DOCUMENTATION CODES:   Severe malnutrition in context of acute illness/injury  INTERVENTION:   Pharmacy not aware of new TPN, will start 10/27, labs ordered May be unable to meet high protein needs due to limitations of premixed parenteral nutrition.   Pt at risk of refeeding syndrome due to severe malnutrition.   NUTRITION DIAGNOSIS:   Malnutrition (Severe) related to acute illness (recent SB perforation) as evidenced by severe depletion of muscle mass, severe fluid accumulation, severe depletion of body fat.  GOAL:   Patient will meet greater than or equal to 90% of their needs  MONITOR:   TF tolerance, Skin, Vent status, I & O's  REASON FOR ASSESSMENT:   Consult New TPN/TNA  ASSESSMENT:   Pt with a past medical hx of small bowel perforation on 01/27/16 s/p resection with subsequent wound dehisence. He was d/c on 02/02/16. Developed septic shock, ischemic bowel, abscess. Pt was taken to the OR on 02/08/16 for exploratory surgery, wound vac placement and ileocectomy with open abdomen.    Pt discussed during ICU rounds and with RN. Per RN pt was eating prior to this admission but other than that nutrition hx unknown.  10/25 exp lab with wound vac, open abdomen 10/26 starting TPN today  Patient is currently intubated on ventilator support MV: 8.1 L/min Temp (24hrs), Avg:98.6 F (37 C), Min:97.5 F (36.4 C), Max:99.6 F (37.6 C)  Medications reviewed  Labs reviewed: Na 131 Nutrition-Focused physical exam completed. Findings are severe fat depletion, severe muscle depletion, and severe edema.   Diet Order:  Diet NPO time specified  Skin:  Wound (see comment) (abdominal incision)  Last BM:  unknown  Height:   Ht Readings from Last 1 Encounters:  02/08/16 5\' 9"  (1.753 m)    Weight:   Wt Readings from Last 1 Encounters:  02/09/16 180 lb 8.9 oz (81.9 kg)    Ideal Body Weight:  72.7 kg  BMI:  Body mass index is 26.66  kg/m.  Estimated Nutritional Needs:   Kcal:  1800  Protein:  130-150 grams  Fluid:  > 2 L/day  EDUCATION NEEDS:   No education needs identified at this time  [place], [place], [place] Pager [phone] After Hours Pager

## 2016-02-09 NOTE — Progress Notes (Signed)
PULMONARY / CRITICAL CARE MEDICINE   Name: [name] MRN: [id number] DOB: 1965/10/09    ADMISSION DATE:  02/08/2016 CONSULTATION DATE:  02/08/16  REFERRING MD:  ED  CHIEF COMPLAINT:  Abdominal pain  Brief History:   [name] is a 50 year old white male with a past medical history of small bowel perforation on 01/27/16 s/p resection with subsequent wound dehisence. He was d/c on 02/02/16. Developed septic shock, ischemic bowel, abscess. Pt was taken to the OR on 02/08/16 for exploratory surgery. Drained abscess, removed ileocecum.  SUBJECTIVE:  Pt remains intubated today. He is difficult to keep sedated per nursing. He had a few events of hypotension over night. He is currently on levo and neo for pressure support, but seems to becoming a bit fluid overloaded. Art line is not working.  VITAL SIGNS: BP 109/63   Pulse (!) 107   Temp 98.6 F (37 C) (Axillary)   Resp 18   Ht 5\' 9"  (1.753 m)   Wt 71.7 kg (158 lb)   SpO2 100%   BMI 23.33 kg/m   HEMODYNAMICS: CVP:  [3 mmHg-8 mmHg] 4 mmHg  VENTILATOR SETTINGS: Vent Mode: PRVC FiO2 (%):  [40 %-50 %] 40 % Set Rate:  [16 bmp-18 bmp] 18 bmp Vt Set:  [570 mL] 570 mL PEEP:  [5 cmH20] 5 cmH20 Plateau Pressure:  [14 cmH20-16 cmH20] 16 cmH20  INTAKE / OUTPUT: I/O last 3 completed shifts: In: 10923.2 [I.V.:4373.2; IV [id number] Out: 2590 [Urine:990; Emesis/NG output:250; Drains:1250; Blood:100]  PHYSICAL EXAMINATION: General:  Intubated, NAD Neuro:  Open eyes spontaneously and to touch HEENT:  PEERL, trachea midline,  no thyromegaly or lymphadenopathy Cardiovascular:  Tachycardic, regular rhythm, no mrg. Pitting edema in bilateral lower extremities Lungs:  CTAB, no rales, rhonchi, wheezes Abdomen:  Midline surgical scare with wound vac, mild erythema under bandage on left side, no bowel sounds Musculoskeletal:  Not assessed, pt intubated Skin:  Warm, dry, pale  LABS:  BMET  Recent Labs Lab 02/08/16 0412  02/08/16 1915 02/09/16 0515  NA 136 131* 131*  K 2.8* 3.9 3.7  CL 98* 103 104  CO2 24 18* 20*  BUN 14 17 17   CREATININE 1.00 0.83 0.75  GLUCOSE 108* 165* 101*    Electrolytes  Recent Labs Lab 02/08/16 0412 02/08/16 1915 02/09/16 0515  CALCIUM 7.2* 6.0* 6.8*    CBC  Recent Labs Lab 02/08/16 0412 02/08/16 1915 02/09/16 0515  WBC 8.1 22.8* 28.9*  HGB 10.7* 10.1* 9.4*  HCT 31.9* 29.5* 27.9*  PLT 563* 631* 534*    Coag's  Recent Labs Lab 02/08/16 0928  APTT 33  INR 1.71    Sepsis Markers  Recent Labs Lab 02/08/16 0652 02/08/16 0928 02/08/16 1315 02/09/16 0515  LATICACIDVEN 6.78*  --  4.3* 2.4*  PROCALCITON  --  31.88  --   --     ABG  Recent Labs Lab 02/08/16 0942 02/08/16 1333  PHART 7.423 7.357  PCO2ART 38.9 35.9  PO2ART 497.0* 167*    Liver Enzymes  Recent Labs Lab 02/08/16 0412 02/09/16 0515  AST 34 26  ALT 17 18  ALKPHOS 147* 97  BILITOT 0.8 0.5  ALBUMIN 1.5* 1.2*    Cardiac Enzymes  Recent Labs Lab 02/08/16 0928  TROPONINI 0.03*    Glucose  Recent Labs Lab 02/08/16 2011  GLUCAP 133*    Imaging Dg Chest Portable 1 View  Result Date: 02/08/2016 CLINICAL DATA:  Sepsis.  Status post intubation. EXAM: PORTABLE CHEST 1 VIEW  COMPARISON:  Single-view of the chest 02/08/2016. FINDINGS: New endotracheal tube is in place with the tip in good position at the level of the clavicular heads. New right IJ approach central venous catheter tip projects just within superior vena cava. The catheter could be withdrawn 2.5-3 cm for better positioning. NG tube tip is in the fundus of the stomach in good position. Lung volumes are low. Patchy airspace disease in the lung bases is most compatible atelectasis more notable left. There is no pneumothorax or pleural effusion. Heart size is normal. Postoperative change thoracic spine is not seen. IMPRESSION: ETT and NG tube projecting good position. Tip of right IJ catheter projects just within  the right atrium. Recommend withdrawal of 2.5-3 cm. Negative for pneumothorax. Patchy basilar airspace disease most compatible atelectasis. Electronically Signed   By: Inge Rise M.D.   On: 02/08/2016 09:11    STUDIES:  CT abd/pelvis 10/25-  >>  ascites with suspected peritonitis. Irregular 16 x 12 cm early pelvic abscess. Edematous dilated small bowel favoring ileus. Wedge-like hypodensity in dome of the liver with additional hepatic hypodensities  XR chest/abdomen 10/25 >> Small LEFT pleural effusion and atelectasis. Scattered air-fluid levels with paucity of large bowel gas concerning for early or partial small bowel obstruction  CULTURES: Blood cultures 10/25 >> pending, no growth at 12 hrs Wound clx 10/25 >>pending  ANTIBIOTICS: 10/25 vanc>>> 10/25 imipenem>>> anidulafungin 02/08/16>>>  SIGNIFICANT EVENTS: Code sepsis called in ED Intubated and central access obtained 02/08/16 Exploratory surgery, wound vac placement and ileocectomy 02/08/16  LINES/TUBES: ETT 02/08/16 Right IJ 02/08/16  DISCUSSION: This is a 50 year old male with suspected ischemic bowel, taken to OR 02/08/16 emergently for exploration. He is septic, on broad spectrum abx and antifungal coverage. Per surgery plan to take patient back to the OR for exploration, ileostomy, possible closure of abdomen on 02/10/16.  ASSESSMENT / PLAN:  PULMONARY A: Pt intubated for support of pain control and return to OR tomorrow P:   Pt remains difficult to sedate and control pain. Per surgery and RT, recommend hold off on weaning d/t returning to OR tomorrow. This is appropriate at this time ABg repeat   CARDIOVASCULAR A:  Tachycardia Hypotension- septic shock New lower extremity edema No evidence rel AI P:  Pt remains tachycardic. Likely due to pain and agitation. Will continue to monitor and control pain On levo and neo, BP maintaining around [id number] systolic. Pt alos becoming slightly edematous. OK to wean  pressure support at this time. Maintain CVP 10-12, cvp 6, bolus No vaso , avoid  RENAL A:   AKI, improving P:   Cr 0.75 today. He continues to make urine. Will follow labs Bolus Saline add at 50   GASTROINTESTINAL A:   ischemic bowel perf at anastomosis site  Abscess abdominal Severe protein cal malnutrition  P:   Surgery following. Pt to return to OR tomorrow. NPO  HEMATOLOGIC A:   No evidence of active bleeding HBG 9.4  dvt prvention P: Cbc in am  scd lovenox  INFECTIOUS A:   Sepsis, open abdominal wound, recent hospitalization Procal 31.88 Abdominal abscess / peritonitis   P:   Abx broadened to cover for nosocomial infection and pseudomonas. Currently on vanc, imipenem and eraxis  Lactic acid decreasing   ENDOCRINE A:   Cortisol 57.6 P:   Monitor blood glucose with daily labs No role stress steroids  NEUROLOGIC A:   Pt sedated and intubated P:   RASS goal: -3, -4 Unable to [employer]  likely, back to OR in am   FAMILY  - Updates: No family at bedside this AM  - Inter-disciplinary family meet or Palliative Care meeting due by:  02/14/16  Quin Hoop, MS4 Attending: Dr. Dwana Melena and [place] Pager: [phone]  02/09/2016, 8:51 AM   STAFF NOTE: I, Merrie Roof, MD FACP have personally reviewed patient's available data, including medical history, events of note, physical examination and test results as part of my evaluation. I have discussed with resident/NP and other care providers such as pharmacist, RN and RRT. In addition, I personally evaluated patient and elicited key findings of:  Sedated rass -4 currently, ronchi, abdo with vac, clean, some edema, cvp 5-6, bolus, dc neo no role weak pressors, increase levophed as needed to MAP goals, no vaso with ischemia concerns, consider echo with continued shock, pccm will perform to assess volume status and if cvp accurate, could also consider  noninvasive monitor if we can get, ABg now to r/o alkalosis with resolving lactic acid, no weaning to OR in am likely, malnuirshed at icu admission, consider tpn today , will order, maintain antifungals and current coverage and follow cultures, no sbt planned, lovenox ok but follow crt clearance, may need additional line for tpn The patient is critically ill with multiple organ systems failure and requires high complexity decision making for assessment and support, frequent evaluation and titration of therapies, application of advanced monitoring technologies and extensive interpretation of multiple databases.   Critical Care Time devoted to patient care services described in this note is45 Minutes. This time reflects time of care of this signee: Merrie Roof, MD FACP. This critical care time does not reflect procedure time, or teaching time or supervisory time of PA/NP/Med student/Med Resident etc but could involve care discussion time. Rest per NP/medical resident whose note is outlined above and that I agree with   Lavon Paganini. Titus Mould, MD, [place] Pgr: [facility] 02/09/2016 9:53 AM

## 2016-02-09 NOTE — Progress Notes (Signed)
eLink Physician-Brief Progress Note Patient Name: [name] DOB: 05/02/1965 MRN: [id number]   Date of Service  02/09/2016  HPI/Events of Note  Hypotension - BP = 90/50. CVP = 3.   eICU Interventions  Will bolus with 0.9 NaCl 1 liter IV over 1 hour now.      Intervention Category Major Interventions: Hypotension - evaluation and management  Sommer,Steven Eugene 02/09/2016, 12:53 AM

## 2016-02-09 NOTE — Progress Notes (Signed)
Restraints D/Cd as now able to fit mitten on left hand and maintaining RASS of neg 3.

## 2016-02-09 NOTE — Progress Notes (Signed)
eLink Physician-Brief Progress Note Patient Name: [name] DOB: September 16, 1965 MRN: [id number]   Date of Service  02/09/2016  HPI/Events of Note  Request to order AM labs.   eICU Interventions  Will order: 1. CBC, CMP and Lactic Acid level in AM.      Intervention Category Minor Interventions: Clinical assessment - ordering diagnostic tests;Routine modifications to care plan (e.g. PRN medications for pain, fever)  Sommer,Steven Eugene 02/09/2016, 3:26 AM

## 2016-02-09 NOTE — Procedures (Signed)
Limited echo For volume status  1. Poor aucustics from vent, lung dz? 2. Only image was subcostal, IVC appeared euvo or hypovomeic, RV mild dilation, LV likely some hyperkinetic 60%-65% EV= hypovolemic? 3. Small pericardial effusion to none  Will bolus and assess response  Lavon Paganini. Titus Mould, MD, [place] Pgr: [facility]

## 2016-02-09 NOTE — Progress Notes (Signed)
PHARMACY - ADULT TOTAL PARENTERAL NUTRITION CONSULT NOTE   Pharmacy Consult for TPN Indication: severe malnutrition and ischemic bowel  Patient Measurements: Height: 5\' 9"  (175.3 cm) Weight: 180 lb 8.9 oz (81.9 kg) IBW/kg (Calculated) : 70.7 TPN AdjBW (KG): 71.7 Body mass index is 26.66 kg/m. Usual Weight:   Assessment:  TPN lab panel has been ordered for pharmacist to start TPN tomorrow.  Current Nutrition:   Plan:  Will start TPN tomorrow following labs   Andrey Cota. Diona Foley, PharmD, [place] Clinical Pharmacist Pager [phone] 02/09/2016,3:50 PM

## 2016-02-09 NOTE — Progress Notes (Signed)
CRITICAL VALUE ALERT  Critical value received:  Calcium 6.0  Date of notification:  02/08/16  Time of notification:  2005  Critical value read back:Yes.    Nurse who received alert:  York Grice, RN  MD notified (1st page):  Dr Ashby Dawes  Time of first page:  2135  MD notified (2nd page):  Time of second page:  Responding MD:  Dr Ashby Dawes  Time MD responded:  2138  MD notified of critical value, along with vital signs and low CVP. Orders received for calcium IV, magnesium, and 1L fluid bolus. Will continue to monitor.

## 2016-02-09 NOTE — Progress Notes (Signed)
Pharmacy Antibiotic Note  [name] is a 50 y.o. male admitted on 02/08/2016 with intra-abdominal infxn.  Pharmacy has been consulted for vancomycin and primaxin dosing. Pt is also on eraxis. Pt is afebrile but WBC is elevated at 28.9, SCr is down to 0.75 and lactic acid is also trending down.   Plan: - Change vancomycin to 1g IV Q8H - Change primaxin to 1g IV Q8H - F/u renal fxn, C&S, clinical status and trough at SS  Height: 5\' 9"  (175.3 cm) Weight: 158 lb (71.7 kg) IBW/kg (Calculated) : 70.7  Temp (24hrs), Avg:98.7 F (37.1 C), Min:98.3 F (36.8 C), Max:99.3 F (37.4 C)   Recent Labs Lab 02/08/16 0412 02/08/16 0416 02/08/16 0652 02/08/16 1315 02/08/16 1915 02/09/16 0515  WBC 8.1  --   --   --  22.8* 28.9*  CREATININE 1.00  --   --   --  0.83 0.75  LATICACIDVEN  --  6.32* 6.78* 4.3*  --  2.4*    Estimated Creatinine Clearance: 111.7 mL/min (by C-G formula based on SCr of 0.75 mg/dL).    Antimicrobials this admission: Vanc 10/25>> Primaxin 10/25>> Eraxis 10/26>> Zosyn x 1 10/25  Dose adjustments this admission: 10/26 Change vanc to 1g Q8H and primaxin to 1g Q8H  Microbiology results: 10/25 MRSA - NEG 10/25 Blood - NGTD 10/25 Peritoneal - pending  Thank you for allowing pharmacy to be a part of this patient's care.  Dennice Tindol, Rande Lawman 02/09/2016 8:15 AM

## 2016-02-09 NOTE — Progress Notes (Signed)
[facility]/[name] Progress Note 1 Day Post-Op  Subjective: Patient still on the ventilator, and Neo and vasopressin.  Urine o utptu is marginal, but renal function is good.  Objective: Vital signs in last 24 hours: Temp:  [98.3 F (36.8 C)-99.3 F (37.4 C)] 98.6 F (37 C) (10/26 0400) Pulse Rate:  [100-141] 103 (10/26 0715) Resp:  [16-36] 18 (10/26 0715) BP: (77-123)/(52-93) 114/65 (10/26 0715) SpO2:  [95 %-100 %] 100 % (10/26 0715) Arterial Line BP: (79-130)/(54-93) 85/82 (10/26 0700) FiO2 (%):  [40 %-100 %] 40 % (10/26 0338) Last BM Date: 02/01/16  Intake/Output from previous day: 10/25 0701 - 10/26 0700 In: 7573.2 [I.V.:4373.2; IV Piggyback:3200] Out: 2590 [Urine:990; Emesis/NG output:250; Drains:1250; Blood:100] Intake/Output this shift: No intake/output data recorded.  General: Sedated, but will awaken and get very agitated  Lungs: Clear.  FIO2 40% with good oxygen saturations  Abd: Soft, good bowel sounds.  Extremities: No changes  Neuro: Intact  Lab Results:  @LABLAST2 (wbc:2,hgb:2,hct:2,plt:2) BMET ) Recent Labs  02/08/16 1915 02/09/16 0515  NA 131* 131*  K 3.9 3.7  CL 103 104  CO2 18* 20*  GLUCOSE 165* 101*  BUN 17 17  CREATININE 0.83 0.75  CALCIUM 6.0* 6.8*   PT/INR  Recent Labs  02/08/16 0928  LABPROT 20.3*  INR 1.71   ABG  Recent Labs  02/08/16 0942 02/08/16 1333  PHART 7.423 7.357  HCO3 25.4 19.7*    Studies/Results: Ct Abdomen Pelvis W Contrast  Result Date: 02/08/2016 CLINICAL DATA:  Recent bowel perforation, severe abdominal pain beginning 1-1/2 days ago. Follow-up probable bowel obstruction. Elevated lactate. EXAM: CT ABDOMEN AND PELVIS WITH CONTRAST TECHNIQUE: Multidetector CT imaging of the abdomen and pelvis was performed using the standard protocol following bolus administration of intravenous contrast. CONTRAST:  135mL ISOVUE-300 IOPAMIDOL (ISOVUE-300) INJECTION 61% COMPARISON:  Abdominal radiograph February 08, 2016 at 0411 hours  FINDINGS: LOWER CHEST: Bilateral lower lobe dependent atelectasis with small LEFT pleural effusion. LEFT lung base pneumatocele wall versus bulla. Heart size is normal. No pericardial effusions. HEPATOBILIARY: Mild periportal edema. Wedge-like hypodensity dome of the liver which persist on delayed phase. Sub cm probable cyst LEFT lobe of the liver. 17 mm hypodensity LEFT lobe of the liver, persists on delayed phase. Distended gallbladder. PANCREAS: Atrophic, otherwise unremarkable. SPLEEN: Normal. ADRENALS/URINARY TRACT: Kidneys are orthotopic, demonstrating symmetric enhancement. Focal RIGHT upper pole renal scarring. No nephrolithiasis, hydronephrosis or solid renal masses. The unopacified ureters are normal in course and caliber. Delayed imaging through the kidneys demonstrates symmetric prompt contrast excretion within the proximal urinary collecting system. Urinary bladder is partially distended and unremarkable. Normal adrenal glands. STOMACH/BOWEL: Multiple loops of fluid-filled small bowel distended a 4.2 cm with mural edema, air-fluid levels. No pneumatosis. Multiple transition points. Decompressed large bowel. VASCULAR/LYMPHATIC: Aortoiliac vessels are normal in course and caliber, mild calcific atherosclerosis. Mildly flattened inferior vena cava. Retroperitoneal lymphadenopathy measuring up to 16 mm. Porta hepatis/portacaval lymphadenopathy 14 mm short access. REPRODUCTIVE: Normal. Moderate ascites. Superimposed peritoneal enhancement. Irregular 16 x 12 cm fluid collection in the pelvis with rim enhancement. MUSCULOSKELETAL: Mild anasarca. Anterior abdominal wall laparotomy dehiscence. Status post L3-4 and L4-5 PLIF, partially incorporated interbody fusion material. Lower thoracic fusion. IMPRESSION: Moderate tense ascites with suspected peritonitis. Irregular 16 x 12 cm early pelvic abscess. Edematous dilated small bowel favoring ileus ; given elevated lactate, early ischemia not excluded. Wedge-like  hypodensity in dome of the liver with additional hepatic hypodensities, these could represent infection or, possible ischemia in the dome of the liver. Recent laparotomy  with anterior abdominal wall dehiscence. Acute findings discussed with and reconfirmed by Dr.COURTNEY HORTON on 02/08/2016 at 6:58 am. Electronically Signed   By: Elon Alas M.D.   On: 02/08/2016 07:00   Dg Chest Portable 1 View  Result Date: 02/08/2016 CLINICAL DATA:  Sepsis.  Status post intubation. EXAM: PORTABLE CHEST 1 VIEW COMPARISON:  Single-view of the chest 02/08/2016. FINDINGS: New endotracheal tube is in place with the tip in good position at the level of the clavicular heads. New right IJ approach central venous catheter tip projects just within superior vena cava. The catheter could be withdrawn 2.5-3 cm for better positioning. NG tube tip is in the fundus of the stomach in good position. Lung volumes are low. Patchy airspace disease in the lung bases is most compatible atelectasis more notable left. There is no pneumothorax or pleural effusion. Heart size is normal. Postoperative change thoracic spine is not seen. IMPRESSION: ETT and NG tube projecting good position. Tip of right IJ catheter projects just within the right atrium. Recommend withdrawal of 2.5-3 cm. Negative for pneumothorax. Patchy basilar airspace disease most compatible atelectasis. Electronically Signed   By: Inge Rise M.D.   On: 02/08/2016 09:11   Dg Abdomen Acute W/chest  Result Date: 02/08/2016 CLINICAL DATA:  Abdominal pain, recent bowel perforation. EXAM: DG ABDOMEN ACUTE W/ 1V CHEST COMPARISON:  Acute abdominal series January 27, 2016 FINDINGS: Cardiomediastinal silhouette is normal. Blunting of LEFT costophrenic angle with strandy densities. No pneumothorax. Mid thoracic fusion hardware. No intraperitoneal free air. Paucity of large bowel gas with scattered small bowel air-fluid levels, dilated small bowel to at least 4 cm. No  intra-abdominal mass effect or pathologic calcifications. LEFT femoral neck probable bone island. L3-4 and L4-5 disc prosthesis. IMPRESSION: Small LEFT pleural effusion and atelectasis. Scattered air-fluid levels with paucity of large bowel gas concerning for early or partial small bowel obstruction. Electronically Signed   By: Elon Alas M.D.   On: 02/08/2016 05:09    Anti-infectives: Anti-infectives    Start     Dose/Rate Route Frequency Ordered Stop   02/10/16 0900  anidulafungin (ERAXIS) 100 mg in sodium chloride 0.9 % 100 mL IVPB     100 mg over 90 Minutes Intravenous Every 24 hours 02/09/16 0804     02/09/16 0900  anidulafungin (ERAXIS) 200 mg in sodium chloride 0.9 % 200 mL IVPB     200 mg over 180 Minutes Intravenous  Once 02/09/16 0804     02/09/16 0830  anidulafungin (ERAXIS) 100 mg in sodium chloride 0.9 % 100 mL IVPB  Status:  Discontinued     100 mg over 90 Minutes Intravenous Every 24 hours 02/08/16 0811 02/09/16 0804   02/08/16 1800  vancomycin (VANCOCIN) IVPB 750 mg/150 ml premix     750 mg 150 mL/hr over 60 Minutes Intravenous Every 8 hours 02/08/16 0847     02/08/16 1400  piperacillin-tazobactam (ZOSYN) IVPB 3.375 g  Status:  Discontinued     3.375 g 12.5 mL/hr over 240 Minutes Intravenous Every 8 hours 02/08/16 0432 02/08/16 0802   02/08/16 0900  imipenem-cilastatin (PRIMAXIN) 500 mg in sodium chloride 0.9 % 100 mL IVPB     500 mg 200 mL/hr over 30 Minutes Intravenous Every 6 hours 02/08/16 0818     02/08/16 0830  ertapenem (INVANZ) 1 g in sodium chloride 0.9 % 50 mL IVPB  Status:  Discontinued     1 g 100 mL/hr over 30 Minutes Intravenous Every 24 hours 02/08/16 0756 02/08/16 [id number]  02/08/16 0830  anidulafungin (ERAXIS) 200 mg in sodium chloride 0.9 % 200 mL IVPB  Status:  Discontinued     200 mg over 180 Minutes Intravenous  Once 02/08/16 0811 02/09/16 0804   02/08/16 0830  vancomycin (VANCOCIN) 1,500 mg in sodium chloride 0.9 % 500 mL IVPB  Status:   Discontinued     1,500 mg 250 mL/hr over 120 Minutes Intravenous  Once 02/08/16 0815 02/09/16 0802   02/08/16 0815  vancomycin (VANCOCIN) 1,500 mg in sodium chloride 0.9 % 500 mL IVPB  Status:  Discontinued     1,500 mg 250 mL/hr over 120 Minutes Intravenous Every 12 hours 02/08/16 0811 02/08/16 0812   02/08/16 0430  piperacillin-tazobactam (ZOSYN) IVPB 3.375 g     3.375 g 100 mL/hr over 30 Minutes Intravenous  Once 02/08/16 0419 02/08/16 0532      Assessment/Plan: s/p Procedure(s): EXPLORATORY LAPAROTOMY APPLICATION OF WOUND VAC ILEOCECETOMY Patient to go back to the OR for exploration, ileostomy, possible closure of abdomen.  LOS: 1 day   Kathryne Eriksson. Dahlia Bailiff, MD, FACS [phone] [facility] 02/09/2016

## 2016-02-09 NOTE — Progress Notes (Signed)
eLink Physician-Brief Progress Note Patient Name: BLAYDE BOAT DOB: February 01, 1966 MRN: [id number]   Date of Service  02/09/2016  HPI/Events of Note  Hypotension and Hypovolemia - Currently on vasopressor and CVP = 3.  eICU Interventions  Will bolus with 0.9 NaCl 1 liter over 1 hour now.      Intervention Category Major Interventions: Hypovolemia - evaluation and treatment with fluids  Debany Vantol Eugene 02/09/2016, 5:54 AM

## 2016-02-10 ENCOUNTER — Inpatient Hospital Stay (HOSPITAL_COMMUNITY): Payer: Worker's Compensation | Admitting: Anesthesiology

## 2016-02-10 ENCOUNTER — Inpatient Hospital Stay (HOSPITAL_COMMUNITY): Payer: Worker's Compensation

## 2016-02-10 ENCOUNTER — Encounter (HOSPITAL_COMMUNITY)
Admission: EM | Disposition: A | Discharge status: Home Health | Payer: Self-pay | Source: Home / Self Care | Attending: Internal Medicine

## 2016-02-10 DIAGNOSIS — J9601 Acute respiratory failure with hypoxia: Secondary | ICD-10-CM

## 2016-02-10 DIAGNOSIS — R6521 Severe sepsis with septic shock: Secondary | ICD-10-CM

## 2016-02-10 HISTORY — PX: LAPAROTOMY: SHX154

## 2016-02-10 HISTORY — PX: ILEOSTOMY: SHX1783

## 2016-02-10 LAB — CBC WITH DIFFERENTIAL/PLATELET
BASOS ABS: 0 10*3/uL (ref 0.0–0.1)
BASOS PCT: 0 %
Eosinophils Absolute: 0 10*3/uL (ref 0.0–0.7)
Eosinophils Relative: 0 %
HEMATOCRIT: 22.2 % — AB (ref 39.0–52.0)
HEMOGLOBIN: 7.5 g/dL — AB (ref 13.0–17.0)
LYMPHS ABS: 1.6 10*3/uL (ref 0.7–4.0)
LYMPHS PCT: 5 %
MCH: 27.4 pg (ref 26.0–34.0)
MCHC: 33.8 g/dL (ref 30.0–36.0)
MCV: 81 fL (ref 78.0–100.0)
MONO ABS: 1.9 10*3/uL — AB (ref 0.1–1.0)
Monocytes Relative: 6 %
Neutro Abs: 28.2 10*3/uL — ABNORMAL HIGH (ref 1.7–7.7)
Neutrophils Relative %: 89 %
Platelets: 390 10*3/uL (ref 150–400)
RBC: 2.74 MIL/uL — AB (ref 4.22–5.81)
RDW: 15.3 % (ref 11.5–15.5)
WBC Morphology: INCREASED
WBC: 31.7 10*3/uL — ABNORMAL HIGH (ref 4.0–10.5)

## 2016-02-10 LAB — CBC
HEMATOCRIT: 23.7 % — AB (ref 39.0–52.0)
HEMOGLOBIN: 8.2 g/dL — AB (ref 13.0–17.0)
MCH: 27.5 pg (ref 26.0–34.0)
MCHC: 34.6 g/dL (ref 30.0–36.0)
MCV: 79.5 fL (ref 78.0–100.0)
Platelets: 305 10*3/uL (ref 150–400)
RBC: 2.98 MIL/uL — AB (ref 4.22–5.81)
RDW: 14.6 % (ref 11.5–15.5)
WBC: 27 10*3/uL — ABNORMAL HIGH (ref 4.0–10.5)

## 2016-02-10 LAB — COMPREHENSIVE METABOLIC PANEL
ALK PHOS: 114 U/L (ref 38–126)
ALT: 17 U/L (ref 17–63)
ANION GAP: 4 — AB (ref 5–15)
AST: 23 U/L (ref 15–41)
Albumin: 1.1 g/dL — ABNORMAL LOW (ref 3.5–5.0)
BILIRUBIN TOTAL: 0.3 mg/dL (ref 0.3–1.2)
BUN: 14 mg/dL (ref 6–20)
CALCIUM: 6.8 mg/dL — AB (ref 8.9–10.3)
CO2: 23 mmol/L (ref 22–32)
Chloride: 105 mmol/L (ref 101–111)
Creatinine, Ser: 0.54 mg/dL — ABNORMAL LOW (ref 0.61–1.24)
GLUCOSE: 91 mg/dL (ref 65–99)
Potassium: 2.9 mmol/L — ABNORMAL LOW (ref 3.5–5.1)
Sodium: 132 mmol/L — ABNORMAL LOW (ref 135–145)
TOTAL PROTEIN: 3.9 g/dL — AB (ref 6.5–8.1)

## 2016-02-10 LAB — PROTIME-INR
INR: 1.44
PROTHROMBIN TIME: 17.7 s — AB (ref 11.4–15.2)

## 2016-02-10 LAB — PHOSPHORUS: PHOSPHORUS: 2.1 mg/dL — AB (ref 2.5–4.6)

## 2016-02-10 LAB — POCT I-STAT 3, ART BLOOD GAS (G3+)
ACID-BASE DEFICIT: 3 mmol/L — AB (ref 0.0–2.0)
Bicarbonate: 21 mmol/L (ref 20.0–28.0)
O2 Saturation: 92 %
PH ART: 7.397 (ref 7.350–7.450)
TCO2: 22 mmol/L (ref 0–100)
pCO2 arterial: 34.1 mmHg (ref 32.0–48.0)
pO2, Arterial: 64 mmHg — ABNORMAL LOW (ref 83.0–108.0)

## 2016-02-10 LAB — GLUCOSE, CAPILLARY
GLUCOSE-CAPILLARY: 77 mg/dL (ref 65–99)
GLUCOSE-CAPILLARY: 94 mg/dL (ref 65–99)
Glucose-Capillary: 98 mg/dL (ref 65–99)

## 2016-02-10 LAB — BASIC METABOLIC PANEL
ANION GAP: 4 — AB (ref 5–15)
BUN: 9 mg/dL (ref 6–20)
CALCIUM: 6.9 mg/dL — AB (ref 8.9–10.3)
CHLORIDE: 106 mmol/L (ref 101–111)
CO2: 24 mmol/L (ref 22–32)
Creatinine, Ser: 0.46 mg/dL — ABNORMAL LOW (ref 0.61–1.24)
GFR calc non Af Amer: >60 mL/min (ref >60)
GLUCOSE: 99 mg/dL (ref 65–99)
POTASSIUM: 3.9 mmol/L (ref 3.5–5.1)
Sodium: 134 mmol/L — ABNORMAL LOW (ref 135–145)

## 2016-02-10 LAB — URINE CULTURE: CULTURE: NO GROWTH

## 2016-02-10 LAB — MAGNESIUM: MAGNESIUM: 1.9 mg/dL (ref 1.7–2.4)

## 2016-02-10 LAB — VANCOMYCIN, TROUGH: Vancomycin Tr: 16 ug/mL (ref 15–20)

## 2016-02-10 LAB — PREPARE RBC (CROSSMATCH)

## 2016-02-10 LAB — CALCIUM, IONIZED: Calcium, Ionized, Serum: 4.4 mg/dL — ABNORMAL LOW (ref 4.5–5.6)

## 2016-02-10 LAB — PREALBUMIN: Prealbumin: <5 mg/dL — ABNORMAL LOW (ref 18–38)

## 2016-02-10 LAB — TRIGLYCERIDES: Triglycerides: 163 mg/dL — ABNORMAL HIGH (ref <150)

## 2016-02-10 SURGERY — LAPAROTOMY, EXPLORATORY
Anesthesia: General | Site: Abdomen

## 2016-02-10 MED ORDER — SODIUM CHLORIDE 0.9 % IV SOLN
Freq: Once | INTRAVENOUS | Status: AC
  Administered 2016-02-12: 13:00:00 via INTRAVENOUS

## 2016-02-10 MED ORDER — POTASSIUM PHOSPHATES 15 MMOLE/5ML IV SOLN
30.0000 mmol | Freq: Once | INTRAVENOUS | Status: AC
  Administered 2016-02-10: 30 mmol via INTRAVENOUS
  Filled 2016-02-10: qty 10

## 2016-02-10 MED ORDER — FENTANYL CITRATE (PF) 100 MCG/2ML IJ SOLN
INTRAMUSCULAR | Status: AC
  Filled 2016-02-10: qty 2

## 2016-02-10 MED ORDER — SODIUM CHLORIDE 0.9% FLUSH
10.0000 mL | INTRAVENOUS | Status: DC | PRN
  Administered 2016-02-17: 10 mL
  Administered 2016-02-21: 20 mL
  Administered 2016-02-29: 40 mL
  Filled 2016-02-10 (×3): qty 40

## 2016-02-10 MED ORDER — ROCURONIUM BROMIDE 100 MG/10ML IV SOLN
INTRAVENOUS | Status: DC | PRN
  Administered 2016-02-10 (×2): 50 mg via INTRAVENOUS

## 2016-02-10 MED ORDER — VASOPRESSIN 20 UNIT/ML IV SOLN
0.0300 [IU]/min | INTRAVENOUS | Status: DC
  Filled 2016-02-10: qty 2

## 2016-02-10 MED ORDER — DEXTROSE 5 % IV SOLN
5.0000 mg | Freq: Once | INTRAVENOUS | Status: AC
  Administered 2016-02-10: 5 mg via INTRAVENOUS
  Filled 2016-02-10: qty 0.5

## 2016-02-10 MED ORDER — SODIUM CHLORIDE 0.9% FLUSH
10.0000 mL | Freq: Two times a day (BID) | INTRAVENOUS | Status: DC
  Administered 2016-02-10 (×2): 30 mL
  Administered 2016-02-11: 20 mL
  Administered 2016-02-12 – 2016-02-14 (×5): 10 mL

## 2016-02-10 MED ORDER — LACTATED RINGERS IV SOLN
INTRAVENOUS | Status: DC | PRN
  Administered 2016-02-10: 10:00:00 via INTRAVENOUS

## 2016-02-10 MED ORDER — SODIUM CHLORIDE 0.9 % IV SOLN
Freq: Once | INTRAVENOUS | Status: AC
  Administered 2016-02-10: 08:00:00 via INTRAVENOUS

## 2016-02-10 MED ORDER — SODIUM CHLORIDE 0.9 % IV SOLN
INTRAVENOUS | Status: DC | PRN
  Administered 2016-02-10: 12:00:00 via INTRAVENOUS

## 2016-02-10 MED ORDER — PROPOFOL 10 MG/ML IV BOLUS
INTRAVENOUS | Status: AC
  Filled 2016-02-10: qty 20

## 2016-02-10 MED ORDER — POTASSIUM CHLORIDE 10 MEQ/50ML IV SOLN
10.0000 meq | INTRAVENOUS | Status: AC
  Administered 2016-02-10 (×4): 10 meq via INTRAVENOUS
  Filled 2016-02-10 (×6): qty 50

## 2016-02-10 MED ORDER — VANCOMYCIN HCL IN DEXTROSE 1-5 GM/200ML-% IV SOLN
1000.0000 mg | Freq: Three times a day (TID) | INTRAVENOUS | Status: DC
  Administered 2016-02-10 – 2016-02-13 (×9): 1000 mg via INTRAVENOUS
  Filled 2016-02-10 (×12): qty 200

## 2016-02-10 MED ORDER — 0.9 % SODIUM CHLORIDE (POUR BTL) OPTIME
TOPICAL | Status: DC | PRN
  Administered 2016-02-10 (×2): 2000 mL

## 2016-02-10 MED ORDER — SODIUM CHLORIDE 0.9 % IV SOLN
1000.0000 mg | Freq: Three times a day (TID) | INTRAVENOUS | Status: DC
  Administered 2016-02-10 – 2016-02-13 (×9): 1000 mg via INTRAVENOUS
  Filled 2016-02-10 (×11): qty 1000

## 2016-02-10 MED ORDER — TRACE MINERALS CR-CU-MN-SE-ZN 10-1000-500-60 MCG/ML IV SOLN
INTRAVENOUS | Status: AC
  Administered 2016-02-10: 17:00:00 via INTRAVENOUS
  Filled 2016-02-10: qty 960

## 2016-02-10 MED ORDER — MIDAZOLAM HCL 2 MG/2ML IJ SOLN
INTRAMUSCULAR | Status: AC
  Filled 2016-02-10: qty 2

## 2016-02-10 MED ORDER — INSULIN ASPART 100 UNIT/ML ~~LOC~~ SOLN
0.0000 [IU] | Freq: Four times a day (QID) | SUBCUTANEOUS | Status: DC
  Administered 2016-02-12: 1 [IU] via SUBCUTANEOUS
  Filled 2016-02-10 (×34): qty 0.09

## 2016-02-10 MED ORDER — ATROPINE SULFATE 1 MG/10ML IJ SOSY
PREFILLED_SYRINGE | INTRAMUSCULAR | Status: AC
  Filled 2016-02-10: qty 10

## 2016-02-10 MED ORDER — MIDAZOLAM HCL 2 MG/2ML IJ SOLN
INTRAMUSCULAR | Status: DC | PRN
  Administered 2016-02-10: 2 mg via INTRAVENOUS

## 2016-02-10 SURGICAL SUPPLY — 44 items
BLADE SURG ROTATE 9660 (MISCELLANEOUS) IMPLANT
BRR ADH 5X3 SEPRAFILM 6 SHT (MISCELLANEOUS)
CANISTER SUCTION 2500CC (MISCELLANEOUS) ×2 IMPLANT
CANISTER WOUND CARE 500ML ATS (WOUND CARE) ×2 IMPLANT
CHLORAPREP W/TINT 26ML (MISCELLANEOUS) ×2 IMPLANT
COVER SURGICAL LIGHT HANDLE (MISCELLANEOUS) ×2 IMPLANT
DRAPE LAPAROSCOPIC ABDOMINAL (DRAPES) ×2 IMPLANT
DRAPE WARM FLUID 44X44 (DRAPE) ×2 IMPLANT
DRSG OPSITE POSTOP 4X10 (GAUZE/BANDAGES/DRESSINGS) IMPLANT
DRSG OPSITE POSTOP 4X8 (GAUZE/BANDAGES/DRESSINGS) IMPLANT
ELECT BLADE 6.5 EXT (BLADE) IMPLANT
ELECT CAUTERY BLADE 6.4 (BLADE) ×2 IMPLANT
ELECT REM PT RETURN 9FT ADLT (ELECTROSURGICAL) ×2
ELECTRODE REM PT RTRN 9FT ADLT (ELECTROSURGICAL) ×1 IMPLANT
GLOVE BIOGEL PI IND STRL 8 (GLOVE) ×1 IMPLANT
GLOVE BIOGEL PI INDICATOR 8 (GLOVE) ×1
GLOVE ECLIPSE 7.5 STRL STRAW (GLOVE) ×2 IMPLANT
GOWN STRL REUS W/ TWL LRG LVL3 (GOWN DISPOSABLE) ×2 IMPLANT
GOWN STRL REUS W/TWL LRG LVL3 (GOWN DISPOSABLE) ×4
KIT BASIN OR (CUSTOM PROCEDURE TRAY) ×2 IMPLANT
KIT OSTOMY DRAINABLE 2.75 STR (WOUND CARE) ×2 IMPLANT
KIT ROOM TURNOVER OR (KITS) ×2 IMPLANT
LIGASURE IMPACT 36 18CM CVD LR (INSTRUMENTS) IMPLANT
NS IRRIG 1000ML POUR BTL (IV SOLUTION) ×4 IMPLANT
PACK GENERAL/GYN (CUSTOM PROCEDURE TRAY) ×2 IMPLANT
PAD ARMBOARD 7.5X6 YLW CONV (MISCELLANEOUS) ×2 IMPLANT
SEPRAFILM PROCEDURAL PACK 3X5 (MISCELLANEOUS) IMPLANT
SPECIMEN JAR LARGE (MISCELLANEOUS) IMPLANT
SPONGE ABDOMINAL VAC ABTHERA (MISCELLANEOUS) ×2 IMPLANT
SPONGE LAP 18X18 X RAY DECT (DISPOSABLE) IMPLANT
STAPLER PROXIMATE 75MM BLUE (STAPLE) ×2 IMPLANT
STAPLER VISISTAT 35W (STAPLE) ×2 IMPLANT
SUCTION POOLE TIP (SUCTIONS) ×2 IMPLANT
SUT NOVA 1 T20/GS 25DT (SUTURE) IMPLANT
SUT PDS AB 1 TP1 96 (SUTURE) ×4 IMPLANT
SUT SILK 2 0 SH CR/8 (SUTURE) ×2 IMPLANT
SUT SILK 2 0 TIES 10X30 (SUTURE) ×2 IMPLANT
SUT SILK 3 0 SH CR/8 (SUTURE) ×2 IMPLANT
SUT SILK 3 0 TIES 10X30 (SUTURE) ×2 IMPLANT
SUT VIC AB 3-0 SH 18 (SUTURE) ×2 IMPLANT
SWABSTICK BENZOIN STERILE (MISCELLANEOUS) ×6 IMPLANT
TOWEL OR 17X26 10 PK STRL BLUE (TOWEL DISPOSABLE) ×2 IMPLANT
TRAY FOLEY CATH 16FRSI W/METER (SET/KITS/TRAYS/PACK) IMPLANT
YANKAUER SUCT BULB TIP NO VENT (SUCTIONS) IMPLANT

## 2016-02-10 NOTE — Progress Notes (Signed)
Pharmacy Antibiotic Note  [name] is a 50 y.o. male admitted on 02/08/2016 with intra-abdominal infection. Pharmacy has been consulted for vancomycin and Primaxin dosing. Patient is also on Eraxis. Patient is afebrile but WBC continues to increase to 31.7, SCr is at baseline to 0.54, and lactic acid is trending down. Trough was drawn prior to fourth dose due to surgical schedule and was within goal at 16.   Plan: - Continue vancomycin 1g IV q8h (Trough goal 15-20) - Continue Primaxin to 1g IV q8h - Continue Eraxis 100 mg q24h per MD  - Follow-up renal fxn, C&S, clinical status and trough at SS  Height: 5\' 9"  (175.3 cm) Weight: 180 lb 8.9 oz (81.9 kg) IBW/kg (Calculated) : 70.7  Temp (24hrs), Avg:98.6 F (37 C), Min:97.5 F (36.4 C), Max:99.6 F (37.6 C)   Recent Labs Lab 02/08/16 0412 02/08/16 0416 02/08/16 0652 02/08/16 1315 02/08/16 1915 02/09/16 0515 02/10/16 0402 02/10/16 0935  WBC 8.1  --   --   --  22.8* 28.9* 31.7*  --   CREATININE 1.00  --   --   --  0.83 0.75 0.54*  --   LATICACIDVEN  --  6.32* 6.78* 4.3*  --  2.4*  --   --   VANCOTROUGH  --   --   --   --   --   --   --  16    Estimated Creatinine Clearance: 111.7 mL/min (by C-G formula based on SCr of 0.54 mg/dL (L)).    Antimicrobials this admission: Vanc 10/25 >> Primaxin 10/25 >> Eraxis 10/26 >> Zosyn x1 10/25  Dose adjustments this admission: 10/26 Change vancomycin to 1g q8h and Primaxin to 1g q8h  Microbiology results: 10/25 MRSA PCR - NEG 10/25 Blood - NGTD 10/25 Peritoneal - pending 10/25 Urine - pending  Thank you for allowing pharmacy to be a part of this patient's care.  Angelena Form, PharmD Candidate 02/10/2016 10:39 AM

## 2016-02-10 NOTE — Transfer of Care (Signed)
Immediate Anesthesia Transfer of Care Note  Patient: [name]  Procedure(s) Performed: Procedure(s): EXPLORATORY LAPAROTOMY (N/A) ILEOSTOMY (N/A)  Patient Location: SICU  Anesthesia Type:General  Level of Consciousness: sedated and Patient remains intubated per anesthesia plan  Airway & Oxygen Therapy: Patient remains intubated per anesthesia plan and Patient placed on Ventilator (see vital sign flow sheet for setting)  Post-op Assessment: Report given to RN and Post -op Vital signs reviewed and stable  Post vital signs: Reviewed and stable  Last Vitals:  Vitals:   02/10/16 0715 02/10/16 0719  BP:  127/69  Pulse:  88  Resp:  14  Temp: 37.6 C     Last Pain:  Vitals:   02/10/16 0715  TempSrc: Oral  PainSc:          Complications: No apparent anesthesia complications

## 2016-02-10 NOTE — Anesthesia Procedure Notes (Addendum)
Date/Time: 02/10/2016 9:45 AM Performed by: Carney Living Pre-anesthesia Checklist: Patient identified, Emergency Drugs available, Suction available, Patient being monitored and Timeout performed Oxygen Delivery Method: Circle system utilized Intubation Type: Inhalational induction Comments: Utilized existing ETT 7.5 subglottic

## 2016-02-10 NOTE — Progress Notes (Signed)
Peripherally Inserted Central Catheter/Midline Placement  The IV Nurse has discussed with the patient and/or persons authorized to consent for the patient, the purpose of this procedure and the potential benefits and risks involved with this procedure.  The benefits include less needle sticks, lab draws from the catheter, and the patient may be discharged home with the catheter. Risks include, but not limited to, infection, bleeding, blood clot (thrombus formation), and puncture of an artery; nerve damage and irregular heartbeat and possibility to perform a PICC exchange if needed/ordered by physician.  Alternatives to this procedure were also discussed.  Bard Power PICC patient education guide, fact sheet on infection prevention and patient information card has been provided to patient /or left at bedside.    PICC/Midline Placement Documentation        Jule Economy Horton 02/10/2016, 1:38 PM

## 2016-02-10 NOTE — Anesthesia Postprocedure Evaluation (Signed)
Anesthesia Post Note  Patient: [name]  Procedure(s) Performed: Procedure(s) (LRB): EXPLORATORY LAPAROTOMY (N/A) ILEOSTOMY (N/A)  Patient location during evaluation: SICU Anesthesia Type: General Level of consciousness: sedated Pain management: pain level controlled Vital Signs Assessment: post-procedure vital signs reviewed and stable Respiratory status: patient remains intubated per anesthesia plan Cardiovascular status: stable Anesthetic complications: no    Last Vitals:  Vitals:   02/10/16 0715 02/10/16 0719  BP:  127/69  Pulse:  88  Resp:  14  Temp: 37.6 C     Last Pain:  Vitals:   02/10/16 0715  TempSrc: Oral  PainSc:                  Tiajuana Amass

## 2016-02-10 NOTE — Progress Notes (Signed)
Patient is much more hemodynamically stable.  Still on Levophed.  INR elevated.  Will five some FFP and Vit K.  Plan to go to the OR for re-exploration and ileostomy and closure possibly.  Kathryne Eriksson. Dahlia Bailiff, MD, [place] [phone] [facility] Surgery

## 2016-02-10 NOTE — Brief Op Note (Signed)
02/10/2016  11:22 AM  PATIENT:  [name]  50 y.o. male  PRE-OPERATIVE DIAGNOSIS:  Open abdmen   POST-OPERATIVE DIAGNOSIS:  Open abdmen   PROCEDURE:  Procedure(s): EXPLORATORY LAPAROTOMY (N/A) ILEOSTOMY (N/A)  SURGEON:  Surgeon(s) and Role:    * Judeth Horn, MD - Primary  PHYSICIAN ASSISTANT:   ASSISTANTSDalbert Batman   ANESTHESIA:   general  EBL:  No intake/output data recorded.  BLOOD ADMINISTERED:250 CC PRBC  DRAINS: Urinary Catheter (Foley) and NPWD   LOCAL MEDICATIONS USED:  NONE  SPECIMEN:  Source of Specimen:  1.5 feet ileum  DISPOSITION OF SPECIMEN:  PATHOLOGY  COUNTS:  YES  TOURNIQUET:  * No tourniquets in log *  DICTATION: .Dragon Dictation  PLAN OF CARE: Back to ICU on ventilator  PATIENT DISPOSITION:  ICU - intubated and critically ill.   Delay start of Pharmacological VTE agent (>24hrs) due to surgical blood loss or risk of bleeding: no

## 2016-02-10 NOTE — Progress Notes (Signed)
eLink Physician-Brief Progress Note Patient Name: [name] DOB: [birth year] MRN: [id number]   Date of Service  02/10/2016  HPI/Events of Note  Hypokalemia and hypophosphatemia  eICU Interventions  Potassium and phos replaced     Intervention Category Intermediate Interventions: Electrolyte abnormality - evaluation and management  [name] 02/10/2016, 5:07 AM

## 2016-02-10 NOTE — Progress Notes (Signed)
PULMONARY / CRITICAL CARE MEDICINE   Name: TYLERJAMES VENTURI MRN: [id number] DOB: 08-May-1965    ADMISSION DATE:  02/08/2016 CONSULTATION DATE:  02/08/16  REFERRING MD:  ED  CHIEF COMPLAINT:  Abdominal pain  Brief History:   Mr. Lamattina is a 50 year old white male with a past medical history of small bowel perforation on 01/27/16 s/p resection with subsequent wound dehisence. He was d/c on 02/02/16. Developed septic shock, ischemic bowel, abscess. Pt was taken to the OR on 02/08/16 for exploratory surgery. Drained abscess, removed ileocecum.  SUBJECTIVE:  Pt remains intubated today. He is difficult to keep sedated per nursing. He had a few events of hypotension over night. He is currently on levo and neo for pressure support, but seems to becoming a bit fluid overloaded. Art line is not working.  VITAL SIGNS: BP 127/69   Pulse 88   Temp 99.6 F (37.6 C) (Oral)   Resp 14   Ht 5\' 9"  (1.753 m)   Wt 81.9 kg (180 lb 8.9 oz)   SpO2 100%   BMI 26.66 kg/m   HEMODYNAMICS: CVP:  [4 mmHg-9 mmHg] 8 mmHg  VENTILATOR SETTINGS: Vent Mode: PRVC FiO2 (%):  [40 %] 40 % Set Rate:  [14 bmp] 14 bmp Vt Set:  [570 mL] 570 mL PEEP:  [5 cmH20] 5 cmH20 Plateau Pressure:  [13 cmH20-19 cmH20] 16 cmH20  INTAKE / OUTPUT: I/O last 3 completed shifts: In: 9909.5 [I.V.:3869.5; NG/GT:30; IV Piggyback:6010] Out: [id number] [Urine:1895; Emesis/NG output:200; Drains:1300]  PHYSICAL EXAMINATION: General:  Intubated, NAD Neuro:  Open eyes spontaneously and to touch HEENT:  PEERL, trachea midline,  no thyromegaly or lymphadenopathy Cardiovascular:  Tachycardic, regular rhythm, no mrg. Pitting edema in bilateral lower extremities Lungs:  CTAB, no rales, rhonchi, wheezes Abdomen:  Midline surgical scare with wound vac, mild erythema under bandage on left side, no bowel sounds Musculoskeletal:  Not assessed, pt intubated Skin:  Warm, dry, pale  LABS:  BMET  Recent Labs Lab 02/08/16 1915 02/09/16 0515  02/10/16 0402  NA 131* 131* 132*  K 3.9 3.7 2.9*  CL 103 104 105  CO2 18* 20* 23  BUN 17 17 14   CREATININE 0.83 0.75 0.54*  GLUCOSE 165* 101* 91    Electrolytes  Recent Labs Lab 02/08/16 1915 02/09/16 0515 02/10/16 0402  CALCIUM 6.0* 6.8* 6.8*  MG  --   --  1.9  PHOS  --   --  2.1*    CBC  Recent Labs Lab 02/08/16 1915 02/09/16 0515 02/10/16 0402  WBC 22.8* 28.9* 31.7*  HGB 10.1* 9.4* 7.5*  HCT 29.5* 27.9* 22.2*  PLT 631* 534* 390    Coag's  Recent Labs Lab 02/08/16 0928 02/09/16 0830 02/10/16 0800  APTT 33  --   --   INR 1.71 1.67 1.44    Sepsis Markers  Recent Labs Lab 02/08/16 0652 02/08/16 0928 02/08/16 1315 02/09/16 0515  LATICACIDVEN 6.78*  --  4.3* 2.4*  PROCALCITON  --  31.88  --   --     ABG  Recent Labs Lab 02/08/16 1333 02/09/16 1033 02/10/16 0257  PHART 7.357 7.422 7.397  PCO2ART 35.9 31.3* 34.1  PO2ART 167* 152* 64.0*    Liver Enzymes  Recent Labs Lab 02/08/16 0412 02/09/16 0515 02/10/16 0402  AST 34 26 23  ALT 17 18 17   ALKPHOS 147* 97 114  BILITOT 0.8 0.5 0.3  ALBUMIN 1.5* 1.2* 1.1*    Cardiac Enzymes  Recent Labs Lab 02/08/16 [id number]  TROPONINI 0.03*    Glucose  Recent Labs Lab 02/08/16 2011  GLUCAP 133*    Imaging Dg Chest Port 1 View  Result Date: 02/10/2016 CLINICAL DATA:  50 year old male with shortness of breath and sepsis EXAM: PORTABLE CHEST 1 VIEW COMPARISON:  Chest radiograph dated 02/08/2016 FINDINGS: Endotracheal tube approximately 3 cm above the carina in stable positioning. Enteric tube extends into the left hemi abdomen with tip and side-port under the left hemidiaphragm. Right IJ central line with tip over right atrium in similar positioning as the prior radiograph. Patchy area of left lung base airspace density appears similar or slightly increased since the prior radiograph and may represent atelectasis versus infiltrate. Small left pleural effusion may be present. A small area of  increased density at the right lung base may represent atelectasis versus pneumonia. There is no pneumothorax. The cardiac silhouette is within normal limits. Lower thoracic fusion hardware. No acute fracture. IMPRESSION: Left lung base airspace opacity appears slightly increased since the prior study. A small left pleural effusion may be present. No pneumothorax. Support line and tubes in stable positioning. Electronically Signed   By: Anner Crete M.D.   On: 02/10/2016 05:52    STUDIES:  CT abd/pelvis 10/25-  >>  ascites with suspected peritonitis. Irregular 16 x 12 cm early pelvic abscess. Edematous dilated small bowel favoring ileus. Wedge-like hypodensity in dome of the liver with additional hepatic hypodensities  XR chest/abdomen 10/25 >> Small LEFT pleural effusion and atelectasis. Scattered air-fluid levels with paucity of large bowel gas concerning for early or partial small bowel obstruction  CULTURES: Blood cultures 10/25 >> pending, no growth at 12 hrs Wound clx 10/25 >>pending  ANTIBIOTICS: 10/25 vanc>>> 10/25 imipenem>>> anidulafungin 02/08/16>>>  SIGNIFICANT EVENTS: Code sepsis called in ED Intubated and central access obtained 02/08/16 Exploratory surgery, wound vac placement and ileocectomy 02/08/16  LINES/TUBES: ETT 02/08/16>>> Right IJ 02/08/16>>>  DISCUSSION: This is a 50 year old male with suspected ischemic bowel, taken to OR 02/08/16 emergently for exploration. He is septic, on broad spectrum abx and antifungal coverage. Per surgery plan to take patient back to the OR for exploration, ileostomy, possible closure of abdomen on 02/10/16.  ASSESSMENT / PLAN:  PULMONARY A: Pt intubated for support of pain control and return to OR tomorrow P:   Full vent support, going back to the OR today. ABG and adjust vent accordingly Titrate O2 for sat of 88-92%.  CARDIOVASCULAR A:  Tachycardia Hypotension- septic shock New lower extremity edema No evidence  rel AI P:  Levophed for BP support. Maintain CVP 10-20. Hold home anti-HTN.  RENAL A:   AKI, improving P:   BMET in AM Replace electrolytes as indicated NS 50 ml/hr.  GASTROINTESTINAL A:   ischemic bowel perf at anastomosis site  Abscess abdominal Severe protein cal malnutrition  P:   Surgery following. Pt to return to OR tomorrow. NPO TPN after placement of PICC line  HEMATOLOGIC A:   No evidence of active bleeding HBG 9.4  dvt prvention  P: Cbc in am  SCD Lovenox  INFECTIOUS A:   Sepsis, open abdominal wound, recent hospitalization Procal 31.88 Abdominal abscess / peritonitis   P:   Abx broadened to cover for nosocomial infection and pseudomonas. Currently on vanc, imipenem and eraxis  Lactic acid decreasing   ENDOCRINE A:   Cortisol 57.6 P:   Monitor blood glucose with daily labs No role stress steroids  NEUROLOGIC A:   Pt sedated and intubated P:   RASS goal: -  3, -4 Unable to WUA likely, back to OR in am   FAMILY  - Updates: No family at bedside this AM  - Inter-disciplinary family meet or Palliative Care meeting due by:  02/14/16  The patient is critically ill with multiple organ systems failure and requires high complexity decision making for assessment and support, frequent evaluation and titration of therapies, application of advanced monitoring technologies and extensive interpretation of multiple databases.   Critical Care Time devoted to patient care services described in this note is  35  Minutes. This time reflects time of care of this signee Dr Jennet Maduro. This critical care time does not reflect procedure time, or teaching time or supervisory time of PA/NP/Med student/Med Resident etc but could involve care discussion time.  Rush Farmer, M.D. [facility] Pulmonary/Critical Care Medicine. Pager: [phone]. After hours pager: [phone].

## 2016-02-10 NOTE — Anesthesia Preprocedure Evaluation (Signed)
Anesthesia Evaluation  Patient identified by MRN, date of birth, ID band Patient unresponsive    Reviewed: Allergy & Precautions, NPO status , [name] Chart, lab work & pertinent test results, Unable to perform ROS - Chart review only  History of Anesthesia Complications (+) history of anesthetic complications ("post-op confusion")  Airway Mallampati: Intubated  TM Distance: >3 FB Neck ROM: Full    Dental  (+) Teeth Intact, Dental Advisory Given   Pulmonary asthma , Current Smoker,    Pulmonary exam normal breath sounds clear to auscultation       Cardiovascular negative cardio ROS Normal cardiovascular exam Rhythm:Regular Rate:Normal     Neuro/Psych negative neurological ROS  negative psych ROS   GI/Hepatic negative GI ROS, Neg liver ROS, Small bowel perforation s/p ex-lap   Endo/Other  negative endocrine ROS  Renal/GU negative Renal ROS     Musculoskeletal negative musculoskeletal ROS (+)   Abdominal   Peds  Hematology  (+) Blood dyscrasia, anemia ,   Anesthesia Other Findings Day of surgery medications reviewed with the patient.  Reproductive/Obstetrics                             Lab Results  Component Value Date   WBC 31.7 (H) 02/10/2016   HGB 7.5 (L) 02/10/2016   HCT 22.2 (L) 02/10/2016   MCV 81.0 02/10/2016   PLT 390 02/10/2016   Lab Results  Component Value Date   CREATININE 0.54 (L) 02/10/2016   BUN 14 02/10/2016   NA 132 (L) 02/10/2016   K 2.9 (L) 02/10/2016   CL 105 02/10/2016   CO2 23 02/10/2016    Anesthesia Physical  Anesthesia Plan  ASA: IV  Anesthesia Plan: General   Post-op Pain Management:    Induction: Inhalational  Airway Management Planned: Oral ETT  Additional Equipment:   Intra-op Plan:   Post-operative Plan: Post-operative intubation/ventilation  Informed Consent: I have reviewed the patients History and Physical, chart, labs and  discussed the procedure including the risks, benefits and alternatives for the proposed anesthesia with the patient or authorized representative who has indicated his/her understanding and acceptance.   Dental advisory given  Plan Discussed with: CRNA  Anesthesia Plan Comments:         Anesthesia Quick Evaluation

## 2016-02-10 NOTE — Progress Notes (Signed)
PHARMACY - ADULT TOTAL PARENTERAL NUTRITION CONSULT NOTE   Pharmacy Consult for TPN Indication: Ischemic bowel and severe malnutrition  Patient Measurements: Height: 5\' 9"  (175.3 cm) Weight: 180 lb 8.9 oz (81.9 kg) IBW/kg (Calculated) : 70.7 TPN AdjBW (KG): 71.7 Body mass index is 26.66 kg/m.  Assessment:  50 year old male with history of small bowel perforation on 01/27/16 s/p resection and subsequent wound dehiscence and was discharged on 02/02/16. Patient was readmitted on 02/08/16 having developed septic shock with ischemic bowel and abscess and was taken to OR 02/08/16 for exploratory surgery with wound vac placement and removal of ileocecum with open abdomen. Patient noted to have severe malnutrition, muscle depletion, and severe edema as he had not eaten any food and had only been drinking water since discharge. Pharmacy consulted for TPN. Plan per surgery is to take back to OR for exploration, ileostomy, and possible closure of abdomen today.  GI: Intra-abdominal abscess and peritonitis after small bowel resection. Severe malnutrition albumin 1.1 (prealbumin <5). NGT output 100 cc last 24 hours. Last BM 10/18.  Endo: No history of DM. CBGs variable, 91-165. No insulin orders.  Insulin requirements in the past 24 hours: Lytes: Na is low (132). K is low (2.9). Phos is low (2.1) - given 30 mmol KPhos + 6 runs of KCl this AM. Magnesium is 1.9.  Renal: Scr 0.54 (low in setting of muscle depletion). UOP ~0.7 cc/kg/hr. I/O + 4L. Net + 8L for admission. Fluid overload noted on exam per physician progress notes. Pulm: Patient is intubated requiring FiO2 40%.  Cards: Currently requiring Norepinephrine at 20 mcg/min, off Neosynephrine. NSR. BP improving. MAP >65 currently.  Hepatobil: LFTs are within normal limits. TG 163 baseline. Albumin 1.1 and pre-albumin <5.  Neuro: Currently sedated with Fentanyl at 350 mcg/hr and Midazolam at 4 mg/hr. RASS -3.  ID: Imipenem and Vancomycin day#3 and  Anidulafungin day #2 for intra-abdominal abscess and diffuse peritonitis. WBC up 31.7, PCT elevated 31.88, and Tmax 99.6. Lactic acid trending down.  Best Practices: Lovenox 30, SCDs, Pepcid IV (will d/c and add to TPN), oral care TPN Access: CVC -R IJ triple lumen (plan to change to quad lumen) TPN start date: 02/10/16  Nutritional Goals (per RD recommendation on 10/26): KCal: 1700 Protein: 125-145 grams  Current Nutrition: NPO  Plan:  Initiate Clinimix E 4.25/10 at 40 ml/hr at 1800 tonight. Hold 20% lipid emulsion for first 7 days for ICU patients per ASPEN guidelines (Start date 02/16/16) Discontinue NS IVF at 1800 with start of TPN bag per Dr. Nelda Marseille.   This provides 41 g of protein and 489 kCals per day meeting 33% of protein and 29 % of kCal needs Add MVI and trace elements in TPN Add sensitive SSI Q6H and adjust as needed Change Pepcid 20mg  IV every 12 hours to 40 mg IV added in TPN bag Monitor TPN labs, repeat BMET, Magnesium, and Phosphorus in AM F/U ability to feed enterally  Sloan Leiter, PharmD, BCPS Clinical Pharmacist [phone] 02/10/2016,7:50 AM

## 2016-02-10 NOTE — Op Note (Signed)
OPERATIVE REPORT  DATE OF OPERATION:  02/10/2016  PATIENT:  [name]  50 y.o. male  PRE-OPERATIVE DIAGNOSIS:  Open abdmen   POST-OPERATIVE DIAGNOSIS:  Open abdmen with history of recent bowel resection and peritonitis  INDICATION(S) FOR OPERATION:  The patient has had an open abdomen for several days and now needs re-exploration for possible closure and ileostomy   FINDINGS:  Evidence of peritonitis.  Distal ileum ischemic and resected.  No further pus or leakage  PROCEDURE:  Procedure(s): EXPLORATORY LAPAROTOMY ILEOSTOMY PLACEMENT OF NEGATIVE PRESSURE WOUND DRESSING  SURGEON:  Surgeon(s): Judeth Horn, MD  ASSISTANT: iNGRAM  ANESTHESIA:   general  COMPLICATIONS:  nONE  EBL: <10 ml  BLOOD ADMINISTERED: 250 CC PRBC  DRAINS: Nasogastric Tube, Urinary Catheter (Foley) and npwd   SPECIMEN:  Source of Specimen:  Distal ileum  COUNTS CORRECT:  YES  PROCEDURE DETAILS: The patient was taken directly to the operating room from the intensive care unit, intubated and sedated with an open abdomen from previous laparotomy and damage control.  He was placed on the table in supine position. The external portion of the negative pressure wound dressing was removed and that he was prepped and draped in the usual sterile manner.  A proper timeout was performed identifying the patient and procedure to be performed. We removed the inner portion of the negative pressure wound dressing from the abdominal cavity. We then explored the cavity where there was found to be significant evidence of peritonitis from the previous operation but no ongoing leakage and no further perforation. The distal one and a half feet of ileum appeared to be ischemic and not perforated and not overtly necrotic however was resected and subsequently an ileostomy brought out the right side of the abdomen.  We ran the small bowel from the ligament of Treitz all the way down to the ileum where the ileostomy was placed.  The ileostomy at the end the case was matured using interrupted 3-0 Vicryl sutures. Once the ileostomy was completed at the end of the case,  this was done after the negative pressure wound dressing up in place. Irrigated with copious amounts saline solution but left no drains. The patient's domain was significantly contracted and therefore we cannot perform a closure adequately. It was felt as though an open abdomen damage control type dressing was necessary. This may be reversed over the weekend.  After the stoma was completed negative pressure wound dressing was in place as the stoma devices placed on the stoma. The patient was then ready for transfer back to the intensive care unit. The patient was taken directly back to the ICU in critical condition on the ventilator.  PATIENT DISPOSITION:  ICU - intubated and critically ill.   Goddess Gebbia 10/27/20171:32 PM

## 2016-02-11 ENCOUNTER — Inpatient Hospital Stay (HOSPITAL_COMMUNITY): Payer: Worker's Compensation

## 2016-02-11 DIAGNOSIS — G9340 Encephalopathy, unspecified: Secondary | ICD-10-CM

## 2016-02-11 LAB — PREPARE FRESH FROZEN PLASMA
Unit division: 0
Unit division: 0

## 2016-02-11 LAB — GLUCOSE, CAPILLARY
GLUCOSE-CAPILLARY: 110 mg/dL — AB (ref 65–99)
Glucose-Capillary: 106 mg/dL — ABNORMAL HIGH (ref 65–99)
Glucose-Capillary: 106 mg/dL — ABNORMAL HIGH (ref 65–99)
Glucose-Capillary: 112 mg/dL — ABNORMAL HIGH (ref 65–99)
Glucose-Capillary: 81 mg/dL (ref 65–99)
Glucose-Capillary: 89 mg/dL (ref 65–99)

## 2016-02-11 LAB — BLOOD GAS, ARTERIAL
Acid-Base Excess: 1.3 mmol/L (ref 0.0–2.0)
BICARBONATE: 25.2 mmol/L (ref 20.0–28.0)
Drawn by: 308661
FIO2: 40
LHR: 14 {breaths}/min
O2 SAT: 97.5 %
PCO2 ART: 38.4 mmHg (ref 32.0–48.0)
PEEP/CPAP: 5 cmH2O
Patient temperature: 98.6
VT: 570 mL
pH, Arterial: 7.432 (ref 7.350–7.450)
pO2, Arterial: 94.6 mmHg (ref 83.0–108.0)

## 2016-02-11 LAB — CBC
HCT: 24.7 % — ABNORMAL LOW (ref 39.0–52.0)
Hemoglobin: 8.5 g/dL — ABNORMAL LOW (ref 13.0–17.0)
MCH: 27.5 pg (ref 26.0–34.0)
MCHC: 34.4 g/dL (ref 30.0–36.0)
MCV: 79.9 fL (ref 78.0–100.0)
PLATELETS: 314 10*3/uL (ref 150–400)
RBC: 3.09 MIL/uL — AB (ref 4.22–5.81)
RDW: 14.8 % (ref 11.5–15.5)
WBC: 22 10*3/uL — ABNORMAL HIGH (ref 4.0–10.5)

## 2016-02-11 LAB — BASIC METABOLIC PANEL
ANION GAP: 4 — AB (ref 5–15)
BUN: 10 mg/dL (ref 6–20)
CO2: 25 mmol/L (ref 22–32)
Calcium: 7 mg/dL — ABNORMAL LOW (ref 8.9–10.3)
Chloride: 105 mmol/L (ref 101–111)
Creatinine, Ser: 0.55 mg/dL — ABNORMAL LOW (ref 0.61–1.24)
GFR calc Af Amer: >60 mL/min (ref >60)
GFR calc non Af Amer: >60 mL/min (ref >60)
GLUCOSE: 108 mg/dL — AB (ref 65–99)
POTASSIUM: 3.8 mmol/L (ref 3.5–5.1)
Sodium: 134 mmol/L — ABNORMAL LOW (ref 135–145)

## 2016-02-11 LAB — MAGNESIUM: Magnesium: 2.1 mg/dL (ref 1.7–2.4)

## 2016-02-11 LAB — PHOSPHORUS: Phosphorus: 1.8 mg/dL — ABNORMAL LOW (ref 2.5–4.6)

## 2016-02-11 MED ORDER — DEXTROSE 5 % IV SOLN
30.0000 mmol | Freq: Once | INTRAVENOUS | Status: AC
  Administered 2016-02-11: 30 mmol via INTRAVENOUS
  Filled 2016-02-11: qty 10

## 2016-02-11 MED ORDER — FUROSEMIDE 10 MG/ML IJ SOLN
20.0000 mg | Freq: Once | INTRAMUSCULAR | Status: AC
  Administered 2016-02-11: 20 mg via INTRAVENOUS
  Filled 2016-02-11: qty 2

## 2016-02-11 MED ORDER — TRACE MINERALS CR-CU-MN-SE-ZN 10-1000-500-60 MCG/ML IV SOLN
INTRAVENOUS | Status: AC
  Administered 2016-02-11: 18:00:00 via INTRAVENOUS
  Filled 2016-02-11: qty 960

## 2016-02-11 NOTE — Progress Notes (Signed)
PULMONARY / CRITICAL CARE MEDICINE   Name: [name] MRN: [id number] DOB: 17-Apr-1965    ADMISSION DATE:  02/08/2016 CONSULTATION DATE:  02/08/16  REFERRING MD:  ED  CHIEF COMPLAINT:  Abdominal pain  Brief History:   [name] is a 50 year old white male with a past medical history of small bowel perforation on 01/27/16 s/p resection with subsequent wound dehisence. He was d/c on 02/02/16. Developed septic shock, ischemic bowel, abscess. Pt was taken to the OR on 02/08/16 for exploratory surgery. Drained abscess, removed ileocecum.  SUBJECTIVE:  Pt remains intubated today-follows simple commands.  Has weaned off pressors >24hr . Back to OR  10/27 with exploratory surgery with bowel section due to peritonitis /distal ileum ischemia . Ileostomy placed. TPN remains . Wound vac in place with large output.    VITAL SIGNS: BP 118/63   Pulse 76   Temp 98.2 F (36.8 C) (Oral)   Resp 14   Ht 5\' 9"  (1.753 m)   Wt 180 lb 8.9 oz (81.9 kg)   SpO2 100%   BMI 26.66 kg/m   HEMODYNAMICS:    VENTILATOR SETTINGS: Vent Mode: PRVC FiO2 (%):  [40 %] 40 % Set Rate:  [14 bmp] 14 bmp Vt Set:  [570 mL] 570 mL PEEP:  [5 cmH20] 5 cmH20 Plateau Pressure:  [17 cmH20-20 cmH20] 19 cmH20  INTAKE / OUTPUT: I/O last 3 completed shifts: In: 7506.7 [I.V.:3821.7; WJ:9454490; Other:220; IV [id number] Out: [id number] [Urine:2300; Drains:1500; Stool:50]  PHYSICAL EXAMINATION: General:  Intubated, NAD Neuro:  Open eyes spontaneously and to touch HEENT:  PEERL, trachea midline,  no thyromegaly or lymphadenopathy Cardiovascular:  Tachycardic, regular rhythm, no mrg. Pitting edema in bilateral lower extremities Lungs:  CTAB, no rales, rhonchi, wheezes Abdomen:  Midline surgical scare with wound vac, no bowel sounds, ileostomy w/ pink stoma  Musculoskeletal: intact  Skin:  Warm, dry, pale  LABS:  BMET  Recent Labs Lab 02/10/16 0402 02/10/16 1928 02/11/16 0406  NA 132* 134* 134*  K 2.9* 3.9 3.8   CL 105 106 105  CO2 23 24 25   BUN 14 9 10   CREATININE 0.54* 0.46* 0.55*  GLUCOSE 91 99 108*    Electrolytes  Recent Labs Lab 02/10/16 0402 02/10/16 1928 02/11/16 0406  CALCIUM 6.8* 6.9* 7.0*  MG 1.9  --  2.1  PHOS 2.1*  --  1.8*    CBC  Recent Labs Lab 02/10/16 0402 02/10/16 1928 02/11/16 0406  WBC 31.7* 27.0* 22.0*  HGB 7.5* 8.2* 8.5*  HCT 22.2* 23.7* 24.7*  PLT 390 305 314    Coag's  Recent Labs Lab 02/08/16 0928 02/09/16 0830 02/10/16 0800  APTT 33  --   --   INR 1.71 1.67 1.44    Sepsis Markers  Recent Labs Lab 02/08/16 0652 02/08/16 0928 02/08/16 1315 02/09/16 0515  LATICACIDVEN 6.78*  --  4.3* 2.4*  PROCALCITON  --  31.88  --   --     ABG  Recent Labs Lab 02/09/16 1033 02/10/16 0257 02/11/16 0321  PHART 7.422 7.397 7.432  PCO2ART 31.3* 34.1 38.4  PO2ART 152* 64.0* 94.6    Liver Enzymes  Recent Labs Lab 02/08/16 0412 02/09/16 0515 02/10/16 0402  AST 34 26 23  ALT 17 18 17   ALKPHOS 147* 97 114  BILITOT 0.8 0.5 0.3  ALBUMIN 1.5* 1.2* 1.1*    Cardiac Enzymes  Recent Labs Lab 02/08/16 0928  TROPONINI 0.03*    Glucose  Recent Labs Lab 02/08/16 2011 02/10/16  1824 02/10/16 1928 02/10/16 2305 02/11/16 0528  GLUCAP 133* 77 94 98 106*    Imaging Dg Chest Port 1 View  Result Date: 02/10/2016 CLINICAL DATA:  PICC line placement EXAM: PORTABLE CHEST 1 VIEW COMPARISON:  02/10/2016 FINDINGS: Right PICC line has been placed with the tip at the cavoatrial junction. Endotracheal tube, right central line, NG tube remain in place, unchanged. Bilateral lower lobe airspace opacities, left greater than right are again noted, stable. Probable small left pleural effusion. IMPRESSION: Right PICC line tip at the cavoatrial junction. Otherwise no change. Electronically Signed   By: Rolm Baptise M.D.   On: 02/10/2016 15:20    STUDIES:  CT abd/pelvis 10/25-  >>  ascites with suspected peritonitis. Irregular 16 x 12 cm early pelvic  abscess. Edematous dilated small bowel favoring ileus. Wedge-like hypodensity in dome of the liver with additional hepatic hypodensities  XR chest/abdomen 10/25 >> Small LEFT pleural effusion and atelectasis. Scattered air-fluid levels with paucity of large bowel gas concerning for early or partial small bowel obstruction  CULTURES: Blood cultures 10/25 >> pending, no growth at 12 hrs Wound clx 10/25 >>e coli (sens to imipen)  UCX 10/25 >neg   ANTIBIOTICS: 10/25 vanc>>> 10/25 imipenem>>> anidulafungin 02/08/16>>>  SIGNIFICANT EVENTS: Code sepsis called in ED Intubated and central access obtained 02/08/16 Exploratory surgery, wound vac placement and ileocectomy 02/08/16 Exploratory surgery-peritonitis , distal ileum ischemia , s/p resection w/ ileostomy placed 02/10/16  LINES/TUBES: ETT 02/08/16>>> Right IJ 02/08/16>>> PICC 02/10/16 >>   DISCUSSION: This is a 50 year old male with suspected ischemic bowel, taken to OR 02/08/16 emergently for exploration. He is septic, on broad spectrum abx and antifungal coverage. Exploratory surgery 10/27 with distal ileum ischemia s/p resection and ileostomy.   ASSESSMENT / PLAN:  PULMONARY A: Pt intubated for support of pain control /Sepsis  P:   Full vent support, Assess daily SBT/Wean  ABG and adjust vent accordingly Titrate O2 for sat of 88-92%.  CARDIOVASCULAR A:  Tachycardia Hypotension- septic shock-Improving , off pressors >24hr  New lower extremity edema ? Component of volume overload (10L + )  No evidence rel AI P:   Hold home anti-HTN. Gentle diuresis Lasix 20mg  IV x 1    RENAL A:   AKI, improving Hypophos P:   BMET in AM Phos replaced  Replace electrolytes as indicated NS KVO  ml/hr.  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  NPO TPN  HEMATOLOGIC A:   Anemia -No evidence  of active bleeding dvt prvention  P: Cbc in am  SCD Lovenox  INFECTIOUS A:   Sepsis, open abdominal wound, recent hospitalization Elevated Procal 31.88/Lactate 6.7  on admission  Abdominal abscess / peritonitis  -wound cx + e coli  P:   Continue Vanc, imipenem and eraxis  Check PCT /La in am   ENDOCRINE A:   Cortisol 57.6 P:   Monitor blood glucose with daily labs No role stress steroids  NEUROLOGIC A:   Pt sedated and intubated P:   RASS goal: -1 to -2   FAMILY  - Updates: No family at bedside this AM  - Inter-disciplinary family meet or Palliative Care meeting due by:  02/14/16   Tammy Parrett NP-C  [facility]  [phone]    Attending Note:  50 year old male with SBO history who had surgical interventions earlier this month  comes back with abscess and wound dehiscence.  Patient was taken to the OR and wound vac placed.  Remains intubated with clear lungs on exam.  I reviewed CXR myself, ETT ok.  Patient is to return to the OR again in AM for further exploration.  Continue TPN and full vent support.  Maintain sedate given plan above.  Adjust vent for ABG.  BM labs ordered.  Will see again in AM.  The patient is critically ill with multiple organ systems failure and requires high complexity decision making for assessment and support, frequent evaluation and titration of therapies, application of advanced monitoring technologies and extensive interpretation of multiple databases.   Critical Care Time devoted to patient care services described in this note is  35  Minutes. This time reflects time of care of this signee Dr Jennet Maduro. This critical care time does not reflect procedure time, or teaching time or supervisory time of PA/NP/Med student/Med Resident etc but could involve care discussion time.  Rush Farmer, M.D. [facility] Pulmonary/Critical Care Medicine. Pager: [phone]. After hours pager: [phone].  02/11/2016

## 2016-02-11 NOTE — Progress Notes (Signed)
[facility] Progress Note Patient Name: [name] DOB: September 24, 1965 MRN: [id number]   Date of Service  02/11/2016  HPI/Events of Note  hypophos  eICU Interventions  Phos replaced     Intervention Category Intermediate Interventions: Electrolyte abnormality - evaluation and management  DETERDING,ELIZABETH 02/11/2016, 4:46 AM

## 2016-02-11 NOTE — Progress Notes (Signed)
PHARMACY - ADULT TOTAL PARENTERAL NUTRITION CONSULT NOTE   Pharmacy Consult for TPN Indication: Ischemic bowel and severe malnutrition  Patient Measurements: Height: 5\' 9"  (175.3 cm) Weight: 180 lb 8.9 oz (81.9 kg) IBW/kg (Calculated) : 70.7 TPN AdjBW (KG): 71.7 Body mass index is 26.66 kg/m.  Assessment:  50 year old male with history of small bowel perforation on 01/27/16 s/p resection and subsequent wound dehiscence and was discharged on 02/02/16. Patient was readmitted on 02/08/16 having developed septic shock with ischemic bowel and abscess and was taken to OR 02/08/16 for exploratory surgery with wound vac placement and removal of ileocecum with open abdomen. Patient noted to have severe malnutrition, muscle depletion, and severe edema as he had not eaten any food and had only been drinking water since discharge. Pharmacy consulted for TPN.  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 Severe malnutrition albumin 1.1 (prealbumin <5), high risk for refeeding. NGT output 100 cc last 24 hours. Last BM 10/18.  Endo: No history of DM. CBGs 77-108. No SSI needed.  Insulin requirements in the past 24 hours: Lytes: Na up some 134. K 3.8. Phos is low (1.8, decreased after 30 mmol KPhos on 10/27) - given another 30 mmol NaPhos this AM. Magnesium 2.1.  Renal: Scr 0.54 (low in setting of muscle depletion). UOP ~0.8 cc/kg/hr. I/O + 2.3L. Net + 11L for admission. Fluid overload noted on exam per physician progress notes. Pulm: Patient is intubated requiring FiO2 40%.  Cards: Currently off pressors. NSR. BP improving. MAP >65.  Hepatobil: LFTs are within normal limits. TG 163 baseline. Albumin 1.1 and pre-albumin <5.  Neuro: Currently sedated with Fentanyl at 350 mcg/hr and Midazolam at 4 mg/hr. RASS -3.  ID: Imipenem and Vancomycin day#4 and Anidulafungin day #3 for intra-abdominal abscess and diffuse  peritonitis. WBC down 22, PCT elevated 31.88, and Tmax 99.2. Lactic acid trending down.  Best Practices: Lovenox 30, SCDs, Pepcid IV (will d/c and add to TPN), oral care TPN Access: CVC -R IJ triple lumen (plan to change to quad lumen) TPN start date: 02/10/16  Nutritional Goals (per RD recommendation on 10/27): KCal: 1800 Protein: 130-150 grams Goal: Clinimix E 5/15 at 100 mL/hr - provides 92% protein needs & 100% kcal needs  Current Nutrition: NPO  Plan:  Change to Clinimix E 5/15 at 40 mL/hr (no increase due to low phos).  Hold 20% lipid emulsion for first 7 days for ICU patients per ASPEN guidelines (Start date 02/16/16) This currently provides 48 g of protein and 682 kCals per day meeting 37% of protein and 38 % of kCal needs.  Add MVI and trace elements in TPN Continue sensitive SSI Q6H and adjust as needed Continue Pepcid 40 mg in TPN bag Monitor closely for refeeding. Monitor TPN labs, repeat BMET, Magnesium, and Phosphorus in AM F/U ability to feed enterally  Sloan Leiter, PharmD, BCPS Clinical Pharmacist [phone] 02/11/2016,7:38 AM

## 2016-02-11 NOTE — Progress Notes (Signed)
[facility]  02/11/2016  Assessment & Plan: Status post ex lap, small bowel resection, ileostomy placement, negative pressure dressing change  VAC intact, stoma viable  IV abx's  Vent per CCM  Off pressors  Anticipate return to OR next 24-48 hours for dressing change and evaluation small bowel        Earnstine Regal, MD, [facility]       Office: [phone]    Subjective: Patient in ICU, vent.  Responds to stimulus.  Objective: Vital signs in last 24 hours: Temp:  [98.2 F (36.8 C)-100.6 F (38.1 C)] 100.6 F (38.1 C) (10/28 1143) Pulse Rate:  [68-89] 76 (10/28 1144) Resp:  [14-22] 14 (10/28 1144) BP: (118-128)/(63-75) 125/67 (10/28 1144) SpO2:  [99 %-100 %] 100 % (10/28 1144) Arterial Line BP: (120-149)/(59-75) 128/71 (10/28 0730) FiO2 (%):  [40 %] 40 % (10/28 1200) Last BM Date: 02/01/16  Intake/Output from previous day: 10/27 0701 - 10/28 0700 In: 5101.4 [I.V.:2466.4; WJ:9454490; IV Piggyback:1566] Out: 2835 [id number]; Drains:1150; Stool:50] Intake/Output this shift: Total I/O In: 567 [I.V.:395; IV Piggyback:172] Out: 2250 [Urine:1950; Drains:300]  Physical Exam: Abdomen - midline VAC dressing intact; stoma right mid abdomen viable, small liquid succus in bag Ext - marked edema  Lab Results:   Recent Labs  02/10/16 1928 02/11/16 0406  WBC 27.0* 22.0*  HGB 8.2* 8.5*  HCT 23.7* 24.7*  PLT 305 314   BMET  Recent Labs  02/10/16 1928 02/11/16 0406  NA 134* 134*  K 3.9 3.8  CL 106 105  CO2 24 25  GLUCOSE 99 108*  BUN 9 10  CREATININE 0.46* 0.55*  CALCIUM 6.9* 7.0*   PT/INR  Recent Labs  02/09/16 0830 02/10/16 0800  LABPROT 19.9* 17.7*  INR 1.67 1.44   Comprehensive Metabolic Panel:    Component Value Date/Time   NA 134 (L) 02/11/2016 0406   NA 134 (L) 02/10/2016 1928   K 3.8 02/11/2016 0406   K 3.9 02/10/2016 1928   CL 105 02/11/2016 0406   CL 106 02/10/2016  1928   CO2 25 02/11/2016 0406   CO2 24 02/10/2016 1928   BUN 10 02/11/2016 0406   BUN 9 02/10/2016 1928   CREATININE 0.55 (L) 02/11/2016 0406   CREATININE 0.46 (L) 02/10/2016 1928   GLUCOSE 108 (H) 02/11/2016 0406   GLUCOSE 99 02/10/2016 1928   CALCIUM 7.0 (L) 02/11/2016 0406   CALCIUM 6.9 (L) 02/10/2016 1928   AST 23 02/10/2016 0402   AST 26 02/09/2016 0515   ALT 17 02/10/2016 0402   ALT 18 02/09/2016 0515   ALKPHOS 114 02/10/2016 0402   ALKPHOS 97 02/09/2016 0515   BILITOT 0.3 02/10/2016 0402   BILITOT 0.5 02/09/2016 0515   PROT 3.9 (L) 02/10/2016 0402   PROT 3.8 (L) 02/09/2016 0515   ALBUMIN 1.1 (L) 02/10/2016 0402   ALBUMIN 1.2 (L) 02/09/2016 0515    Studies/Results: Dg Chest Port 1 View  Result Date: 02/11/2016 CLINICAL DATA:  Endotracheal tube placement. EXAM: PORTABLE CHEST 1 VIEW COMPARISON:  02/10/2016 FINDINGS: There is an endotracheal tube with the tip 2.3 cm above the carina. Left basilar chest tube in unchanged position. Right-sided PICC line with the tip projecting over the cavoatrial junction. Right IJ central venous catheter with the tip projecting over the right atrium. Small left pleural effusion and hazy left lower lobe airspace disease. Mild right lower lobe airspace disease likely reflecting atelectasis. No  pneumothorax. Stable cardiomediastinal silhouette. Nasogastric tube in unchanged position. No acute osseous abnormality. IMPRESSION: 1. Endotracheal tube with the tip 2.3 cm above the carina. 2. Otherwise no significant interval change compared with 02/10/2016. Electronically Signed   By: Kathreen Devoid   On: 02/11/2016 08:42   Dg Chest Port 1 View  Result Date: 02/10/2016 CLINICAL DATA:  PICC line placement EXAM: PORTABLE CHEST 1 VIEW COMPARISON:  02/10/2016 FINDINGS: Right PICC line has been placed with the tip at the cavoatrial junction. Endotracheal tube, right central line, NG tube remain in place, unchanged. Bilateral lower lobe airspace opacities, left  greater than right are again noted, stable. Probable small left pleural effusion. IMPRESSION: Right PICC line tip at the cavoatrial junction. Otherwise no change. Electronically Signed   By: Rolm Baptise M.D.   On: 02/10/2016 15:20   Dg Chest Port 1 View  Result Date: 02/10/2016 CLINICAL DATA:  50 year old male with shortness of breath and sepsis EXAM: PORTABLE CHEST 1 VIEW COMPARISON:  Chest radiograph dated 02/08/2016 FINDINGS: Endotracheal tube approximately 3 cm above the carina in stable positioning. Enteric tube extends into the left hemi abdomen with tip and side-port under the left hemidiaphragm. Right IJ central line with tip over right atrium in similar positioning as the prior radiograph. Patchy area of left lung base airspace density appears similar or slightly increased since the prior radiograph and may represent atelectasis versus infiltrate. Small left pleural effusion may be present. A small area of increased density at the right lung base may represent atelectasis versus pneumonia. There is no pneumothorax. The cardiac silhouette is within normal limits. Lower thoracic fusion hardware. No acute fracture. IMPRESSION: Left lung base airspace opacity appears slightly increased since the prior study. A small left pleural effusion may be present. No pneumothorax. Support line and tubes in stable positioning. Electronically Signed   By: Anner Crete M.D.   On: 02/10/2016 05:52      Elieser Tetrick M 02/11/2016  Patient ID: [name], male   DOB: May 28, 1965, 50 y.o.   MRN: [id number]

## 2016-02-12 ENCOUNTER — Inpatient Hospital Stay (HOSPITAL_COMMUNITY): Payer: Worker's Compensation | Admitting: Anesthesiology

## 2016-02-12 ENCOUNTER — Encounter (HOSPITAL_COMMUNITY)
Admission: EM | Disposition: A | Discharge status: Home Health | Payer: Self-pay | Source: Home / Self Care | Attending: Internal Medicine

## 2016-02-12 ENCOUNTER — Inpatient Hospital Stay (HOSPITAL_COMMUNITY): Payer: Worker's Compensation

## 2016-02-12 DIAGNOSIS — J810 Acute pulmonary edema: Secondary | ICD-10-CM

## 2016-02-12 HISTORY — PX: VACUUM ASSISTED CLOSURE CHANGE: SHX5227

## 2016-02-12 LAB — CBC
HEMATOCRIT: 24 % — AB (ref 39.0–52.0)
Hemoglobin: 8.1 g/dL — ABNORMAL LOW (ref 13.0–17.0)
MCH: 26.8 pg (ref 26.0–34.0)
MCHC: 33.8 g/dL (ref 30.0–36.0)
MCV: 79.5 fL (ref 78.0–100.0)
PLATELETS: 265 10*3/uL (ref 150–400)
RBC: 3.02 MIL/uL — ABNORMAL LOW (ref 4.22–5.81)
RDW: 14.7 % (ref 11.5–15.5)
WBC: 13.2 10*3/uL — ABNORMAL HIGH (ref 4.0–10.5)

## 2016-02-12 LAB — GLUCOSE, CAPILLARY
GLUCOSE-CAPILLARY: 105 mg/dL — AB (ref 65–99)
GLUCOSE-CAPILLARY: 110 mg/dL — AB (ref 65–99)
GLUCOSE-CAPILLARY: 130 mg/dL — AB (ref 65–99)
GLUCOSE-CAPILLARY: 99 mg/dL (ref 65–99)
Glucose-Capillary: 89 mg/dL (ref 65–99)
Glucose-Capillary: 96 mg/dL (ref 65–99)
Glucose-Capillary: 98 mg/dL (ref 65–99)

## 2016-02-12 LAB — TYPE AND SCREEN
ABO/RH(D): A NEG
Antibody Screen: NEGATIVE
Unit division: 0
Unit division: 0

## 2016-02-12 LAB — MAGNESIUM: Magnesium: 1.7 mg/dL (ref 1.7–2.4)

## 2016-02-12 LAB — BASIC METABOLIC PANEL
ANION GAP: 4 — AB (ref 5–15)
Anion gap: 6 (ref 5–15)
BUN: 5 mg/dL — AB (ref 6–20)
BUN: <5 mg/dL — ABNORMAL LOW (ref 6–20)
CALCIUM: 6.9 mg/dL — AB (ref 8.9–10.3)
CHLORIDE: 98 mmol/L — AB (ref 101–111)
CO2: 27 mmol/L (ref 22–32)
CO2: 28 mmol/L (ref 22–32)
CREATININE: 0.33 mg/dL — AB (ref 0.61–1.24)
Calcium: 7 mg/dL — ABNORMAL LOW (ref 8.9–10.3)
Chloride: 104 mmol/L (ref 101–111)
Creatinine, Ser: 0.41 mg/dL — ABNORMAL LOW (ref 0.61–1.24)
GFR calc Af Amer: >60 mL/min (ref >60)
GFR calc non Af Amer: >60 mL/min (ref >60)
GLUCOSE: 99 mg/dL (ref 65–99)
Glucose, Bld: 114 mg/dL — ABNORMAL HIGH (ref 65–99)
POTASSIUM: 3 mmol/L — AB (ref 3.5–5.1)
Potassium: 2.9 mmol/L — ABNORMAL LOW (ref 3.5–5.1)
SODIUM: 132 mmol/L — AB (ref 135–145)
Sodium: 135 mmol/L (ref 135–145)

## 2016-02-12 LAB — PROCALCITONIN: Procalcitonin: 3.37 ng/mL

## 2016-02-12 LAB — LACTIC ACID, PLASMA: Lactic Acid, Venous: 1 mmol/L (ref 0.5–1.9)

## 2016-02-12 LAB — PHOSPHORUS: Phosphorus: 2.7 mg/dL (ref 2.5–4.6)

## 2016-02-12 SURGERY — REPLACEMENT, WOUND VAC DRESSING, ABDOMEN
Anesthesia: General

## 2016-02-12 MED ORDER — 0.9 % SODIUM CHLORIDE (POUR BTL) OPTIME
TOPICAL | Status: DC | PRN
  Administered 2016-02-12: 2000 mL

## 2016-02-12 MED ORDER — MAGNESIUM SULFATE 2 GM/50ML IV SOLN
2.0000 g | Freq: Once | INTRAVENOUS | Status: AC
  Administered 2016-02-12: 2 g via INTRAVENOUS
  Filled 2016-02-12: qty 50

## 2016-02-12 MED ORDER — ROCURONIUM BROMIDE 100 MG/10ML IV SOLN
INTRAVENOUS | Status: DC | PRN
  Administered 2016-02-12: 50 mg via INTRAVENOUS
  Administered 2016-02-12: 30 mg via INTRAVENOUS

## 2016-02-12 MED ORDER — CHLORHEXIDINE GLUCONATE CLOTH 2 % EX PADS: 6.0000 | MEDICATED_PAD | Freq: Once | CUTANEOUS | Status: AC

## 2016-02-12 MED ORDER — ROCURONIUM BROMIDE 10 MG/ML (PF) SYRINGE
PREFILLED_SYRINGE | INTRAVENOUS | Status: AC
  Filled 2016-02-12: qty 10

## 2016-02-12 MED ORDER — TRACE MINERALS CR-CU-MN-SE-ZN 10-1000-500-60 MCG/ML IV SOLN
INTRAVENOUS | Status: AC
  Administered 2016-02-12 (×2): via INTRAVENOUS
  Filled 2016-02-12: qty 1992

## 2016-02-12 MED ORDER — PROPOFOL 10 MG/ML IV BOLUS
INTRAVENOUS | Status: AC
  Filled 2016-02-12: qty 20

## 2016-02-12 MED ORDER — POTASSIUM CHLORIDE 10 MEQ/50ML IV SOLN
10.0000 meq | INTRAVENOUS | Status: AC
  Administered 2016-02-12 (×6): 10 meq via INTRAVENOUS
  Filled 2016-02-12 (×6): qty 50

## 2016-02-12 MED ORDER — POTASSIUM CHLORIDE 10 MEQ/50ML IV SOLN
10.0000 meq | INTRAVENOUS | Status: AC
  Administered 2016-02-12 – 2016-02-13 (×6): 10 meq via INTRAVENOUS
  Filled 2016-02-12: qty 50

## 2016-02-12 MED ORDER — CHLORHEXIDINE GLUCONATE CLOTH 2 % EX PADS
6.0000 | MEDICATED_PAD | Freq: Once | CUTANEOUS | Status: AC
  Administered 2016-02-12: 6 via TOPICAL

## 2016-02-12 MED ORDER — POTASSIUM CHLORIDE 10 MEQ/50ML IV SOLN: 10.0000 meq | INTRAVENOUS | Status: DC

## 2016-02-12 MED ORDER — SUCCINYLCHOLINE CHLORIDE 200 MG/10ML IV SOSY
PREFILLED_SYRINGE | INTRAVENOUS | Status: AC
Start: 2016-02-12 — End: 2016-02-12
  Filled 2016-02-12: qty 10

## 2016-02-12 MED ORDER — SODIUM PHOSPHATES 45 MMOLE/15ML IV SOLN
10.0000 mmol | Freq: Once | INTRAVENOUS | Status: AC
  Administered 2016-02-12: 10 mmol via INTRAVENOUS
  Filled 2016-02-12: qty 3.33

## 2016-02-12 MED ORDER — SODIUM CHLORIDE 0.9 % IV SOLN
INTRAVENOUS | Status: DC | PRN
  Administered 2016-02-12: 13:00:00 via INTRAVENOUS

## 2016-02-12 MED ORDER — FUROSEMIDE 10 MG/ML IJ SOLN
20.0000 mg | Freq: Four times a day (QID) | INTRAMUSCULAR | Status: AC
  Administered 2016-02-12 (×3): 20 mg via INTRAVENOUS
  Filled 2016-02-12 (×3): qty 2

## 2016-02-12 SURGICAL SUPPLY — 29 items
CANISTER SUCT LVC 12 LTR MEDI- (MISCELLANEOUS) IMPLANT
CANISTER SUCTION 2500CC (MISCELLANEOUS) ×3 IMPLANT
CANISTER WOUND CARE 500ML ATS (WOUND CARE) ×3 IMPLANT
COVER SURGICAL LIGHT HANDLE (MISCELLANEOUS) ×3 IMPLANT
DRAPE LAPAROSCOPIC ABDOMINAL (DRAPES) ×3 IMPLANT
DRAPE UTILITY XL STRL (DRAPES) IMPLANT
DRSG PAD ABDOMINAL 8X10 ST (GAUZE/BANDAGES/DRESSINGS) ×6 IMPLANT
ELECT REM PT RETURN 9FT ADLT (ELECTROSURGICAL) ×3
ELECTRODE REM PT RTRN 9FT ADLT (ELECTROSURGICAL) ×1 IMPLANT
GLOVE BIO SURGEON STRL SZ8 (GLOVE) ×3 IMPLANT
GLOVE BIOGEL PI IND STRL 8 (GLOVE) ×1 IMPLANT
GLOVE BIOGEL PI INDICATOR 8 (GLOVE) ×2
GOWN STRL REUS W/ TWL LRG LVL3 (GOWN DISPOSABLE) ×2 IMPLANT
GOWN STRL REUS W/ TWL XL LVL3 (GOWN DISPOSABLE) ×1 IMPLANT
GOWN STRL REUS W/TWL LRG LVL3 (GOWN DISPOSABLE) ×6
GOWN STRL REUS W/TWL XL LVL3 (GOWN DISPOSABLE) ×3
KIT BASIN OR (CUSTOM PROCEDURE TRAY) ×3 IMPLANT
KIT ROOM TURNOVER OR (KITS) ×3 IMPLANT
NS IRRIG 1000ML POUR BTL (IV SOLUTION) ×6 IMPLANT
PACK GENERAL/GYN (CUSTOM PROCEDURE TRAY) ×3 IMPLANT
PAD ARMBOARD 7.5X6 YLW CONV (MISCELLANEOUS) ×6 IMPLANT
SPONGE ABDOMINAL VAC ABTHERA (MISCELLANEOUS) ×3 IMPLANT
SPONGE GAUZE 4X4 12PLY STER LF (GAUZE/BANDAGES/DRESSINGS) ×3 IMPLANT
SUCTION POOLE TIP (SUCTIONS) ×3 IMPLANT
SUT NOVA 1 T20/GS 25DT (SUTURE) ×18 IMPLANT
TAPE CLOTH SURG 6X10 WHT LF (GAUZE/BANDAGES/DRESSINGS) ×3 IMPLANT
TOWEL OR 17X24 6PK STRL BLUE (TOWEL DISPOSABLE) ×3 IMPLANT
TOWEL OR 17X26 10 PK STRL BLUE (TOWEL DISPOSABLE) ×3 IMPLANT
WATER STERILE IRR 1000ML POUR (IV SOLUTION) IMPLANT

## 2016-02-12 NOTE — Anesthesia Preprocedure Evaluation (Signed)
Anesthesia Evaluation  Patient identified by MRN, date of birth, ID band Patient unresponsive    Reviewed: Allergy & Precautions, NPO status , [name] Chart, lab work & pertinent test results  History of Anesthesia Complications (+) history of anesthetic complications ("post-op confusion")  Airway Mallampati: Intubated   Neck ROM: Full    Dental  (+) Teeth Intact, Dental Advisory Given   Pulmonary asthma , Current Smoker,       + intubated    Cardiovascular negative cardio ROS Normal cardiovascular exam Rhythm:Regular Rate:Normal     Neuro/Psych negative neurological ROS  negative psych ROS   GI/Hepatic negative GI ROS, Neg liver ROS, Small bowel perforation s/p ex-lap   Endo/Other  negative endocrine ROS  Renal/GU negative Renal ROS     Musculoskeletal negative musculoskeletal ROS (+)   Abdominal   Peds  Hematology  (+) Blood dyscrasia, anemia ,   Anesthesia Other Findings Intubated, sedated in ICU  Reproductive/Obstetrics                             Lab Results  Component Value Date   WBC 13.2 (H) 02/12/2016   HGB 8.1 (L) 02/12/2016   HCT 24.0 (L) 02/12/2016   MCV 79.5 02/12/2016   PLT 265 02/12/2016   Lab Results  Component Value Date   CREATININE 0.33 (L) 02/12/2016   BUN 5 (L) 02/12/2016   NA 135 02/12/2016   K 2.9 (L) 02/12/2016   CL 104 02/12/2016   CO2 27 02/12/2016    Anesthesia Physical  Anesthesia Plan  ASA: IV  Anesthesia Plan: General   Post-op Pain Management:    Induction: Intravenous  Airway Management Planned: Oral ETT  Additional Equipment:   Intra-op Plan:   Post-operative Plan: Post-operative intubation/ventilation  Informed Consent: I have reviewed the patients History and Physical, chart, labs and discussed the procedure including the risks, benefits and alternatives for the proposed anesthesia with the patient or authorized  representative who has indicated his/her understanding and acceptance.   Dental advisory given  Plan Discussed with: CRNA  Anesthesia Plan Comments:         Anesthesia Quick Evaluation

## 2016-02-12 NOTE — Progress Notes (Signed)
PULMONARY / CRITICAL CARE MEDICINE   Name: [name] MRN: [id number] DOB: 1965-09-13    ADMISSION DATE:  02/08/2016 CONSULTATION DATE:  02/08/16  REFERRING MD:  ED  CHIEF COMPLAINT:  Abdominal pain  Brief History:   Mr. Nellum is a 50 year old white male with a past medical history of small bowel perforation on 01/27/16 s/p resection with subsequent wound dehisence. He was d/c on 02/02/16. Developed septic shock, ischemic bowel, abscess. Pt was taken to the OR on 02/08/16 for exploratory surgery. Drained abscess, removed ileocecum.  SUBJECTIVE:  Pt remains intubated today-follows simple commands.  Has weaned off pressors >24hr . Back to OR  10/27 with exploratory surgery with bowel section due to peritonitis /distal ileum ischemia . Ileostomy placed. TPN remains . Wound vac in place with large output.    VITAL SIGNS: BP 138/71   Pulse 76   Temp 99.6 F (37.6 C) (Oral)   Resp 14   Ht 5\' 9"  (1.753 m)   Wt 81.9 kg (180 lb 8.9 oz)   SpO2 100%   BMI 26.66 kg/m   HEMODYNAMICS:    VENTILATOR SETTINGS: Vent Mode: PRVC FiO2 (%):  [40 %] 40 % Set Rate:  [14 bmp] 14 bmp Vt Set:  [570 mL] 570 mL PEEP:  [5 cmH20] 5 cmH20 Plateau Pressure:  [11 cmH20-17 cmH20] 17 cmH20  INTAKE / OUTPUT: I/O last 3 completed shifts: In: 5294.3 [I.V.:2706.3; Other:200; IV Piggyback:2388] Out: [id number] [Urine:4155; Drains:1450; Stool:50]  PHYSICAL EXAMINATION: General:  Intubated, NAD Neuro:  Open eyes spontaneously and to touch HEENT:  PEERL, trachea midline,  no thyromegaly or lymphadenopathy Cardiovascular:  Tachycardic, regular rhythm, no mrg. Pitting edema in bilateral lower extremities Lungs:  CTAB, no rales, rhonchi, wheezes Abdomen:  Midline surgical scare with wound vac, no bowel sounds, ileostomy w/ pink stoma  Musculoskeletal: intact  Skin:  Warm, dry, pale  LABS:  BMET  Recent Labs Lab 02/10/16 1928 02/11/16 0406 02/12/16 0555  NA 134* 134* 135  K 3.9 3.8 2.9*  CL 106  105 104  CO2 24 25 27   BUN 9 10 5*  CREATININE 0.46* 0.55* 0.33*  GLUCOSE 99 108* 99    Electrolytes  Recent Labs Lab 02/10/16 0402 02/10/16 1928 02/11/16 0406 02/12/16 0555  CALCIUM 6.8* 6.9* 7.0* 6.9*  MG 1.9  --  2.1 1.7  PHOS 2.1*  --  1.8* 2.7    CBC  Recent Labs Lab 02/10/16 1928 02/11/16 0406 02/12/16 0555  WBC 27.0* 22.0* 13.2*  HGB 8.2* 8.5* 8.1*  HCT 23.7* 24.7* 24.0*  PLT 305 314 265    Coag's  Recent Labs Lab 02/08/16 0928 02/09/16 0830 02/10/16 0800  APTT 33  --   --   INR 1.71 1.67 1.44    Sepsis Markers  Recent Labs Lab 02/08/16 0928 02/08/16 1315 02/09/16 0515 02/12/16 0555  LATICACIDVEN  --  4.3* 2.4* 1.0  PROCALCITON 31.88  --   --   --     ABG  Recent Labs Lab 02/09/16 1033 02/10/16 0257 02/11/16 0321  PHART 7.422 7.397 7.432  PCO2ART 31.3* 34.1 38.4  PO2ART 152* 64.0* 94.6    Liver Enzymes  Recent Labs Lab 02/08/16 0412 02/09/16 0515 02/10/16 0402  AST 34 26 23  ALT 17 18 17   ALKPHOS 147* 97 114  BILITOT 0.8 0.5 0.3  ALBUMIN 1.5* 1.2* 1.1*    Cardiac Enzymes  Recent Labs Lab 02/08/16 0928  TROPONINI 0.03*    Glucose  Recent Labs  Lab 02/11/16 1147 02/11/16 1558 02/11/16 1821 02/11/16 2340 02/12/16 0606 02/12/16 0733  GLUCAP 106* 112* 81 89 89 96    Imaging No results found.  STUDIES:  CT abd/pelvis 10/25-  >>  ascites with suspected peritonitis. Irregular 16 x 12 cm early pelvic abscess. Edematous dilated small bowel favoring ileus. Wedge-like hypodensity in dome of the liver with additional hepatic hypodensities  XR chest/abdomen 10/25 >> Small LEFT pleural effusion and atelectasis. Scattered air-fluid levels with paucity of large bowel gas concerning for early or partial small bowel obstruction  CULTURES: Blood cultures 10/25 >> pending, no growth at 12 hrs Wound clx 10/25 >>e coli (sens to imipen)  UCX 10/25 >neg   ANTIBIOTICS: 10/25 vanc>>> 10/25 imipenem>>> anidulafungin  02/08/16>>>  SIGNIFICANT EVENTS: Code sepsis called in ED Intubated and central access obtained 02/08/16 Exploratory surgery, wound vac placement and ileocectomy 02/08/16 Exploratory surgery-peritonitis , distal ileum ischemia , s/p resection w/ ileostomy placed 02/10/16  LINES/TUBES: ETT 02/08/16>>> Right IJ 02/08/16>>> PICC 02/10/16 >>  DISCUSSION: This is a 50 year old male with suspected ischemic bowel, taken to OR 02/08/16 emergently for exploration. He is septic, on broad spectrum abx and antifungal coverage. Exploratory surgery 10/27 with distal ileum ischemia s/p resection and ileostomy.   ASSESSMENT / PLAN:  PULMONARY A: Pt intubated for support of pain control /Sepsis  P:   Full vent support Assess daily SBT/Wean after OR visits ABG and adjust vent accordingly Titrate O2 for sat of 88-92%.  CARDIOVASCULAR A:  Tachycardia Hypotension- septic shock-Improving , off pressors >24hr  New lower extremity edema ? Component of volume overload (10L + )  No evidence rel AI P:  Hold home anti-HTN. Lasix 20 mg IV q6 x3 doses.  RENAL A:   AKI, improving Hypophos P:   BMET in AM Lasix 20 mg IV q6 x3 doses. 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  NPO TPN  HEMATOLOGIC A:   Anemia -No evidence of active bleeding dvt prvention  P: Cbc in am  SCD Lovenox  INFECTIOUS A:   Sepsis, open abdominal wound, recent hospitalization Elevated Procal 31.88/Lactate 6.7  on admission  Abdominal abscess / peritonitis  -wound cx + e coli  P:   Continue Vanc, imipenem and eraxis  Check PCT /La in am   ENDOCRINE A:   Cortisol 57.6 P:   Monitor blood glucose with daily labs No role stress steroids  NEUROLOGIC A:   Pt sedated and intubated P:   RASS goal: -1 to -2   FAMILY  - Updates: No family  at bedside this AM  - Inter-disciplinary family meet or Palliative Care meeting due by:  02/14/16  The patient is critically ill with multiple organ systems failure and requires high complexity decision making for assessment and support, frequent evaluation and titration of therapies, application of advanced monitoring technologies and extensive interpretation of multiple databases.   Critical Care Time devoted to patient care services described in this note is  35  Minutes. This time reflects time of care of this signee Dr Jennet Maduro. This critical care time does not reflect procedure time, or teaching time or supervisory time of PA/NP/Med student/Med Resident etc but could involve care discussion time.  Rush Farmer, M.D. [facility] Pulmonary/Critical Care Medicine. Pager: [phone]. After hours pager: [phone].  02/12/2016

## 2016-02-12 NOTE — Progress Notes (Signed)
2 Days Post-Op  Subjective: On vent  awake  Objective: Vital signs in last 24 hours: Temp:  [99.4 F (37.4 C)-100.6 F (38.1 C)] 99.6 F (37.6 C) (10/29 0732) Pulse Rate:  [73-108] 82 (10/29 1000) Resp:  [13-20] 14 (10/29 1000) BP: (125-138)/(67-72) 138/71 (10/29 0719) SpO2:  [100 %] 100 % (10/29 1000) Arterial Line BP: (120-144)/(68-77) 140/74 (10/29 1000) FiO2 (%):  [40 %] 40 % (10/29 1000) Last BM Date: 02/12/16  Intake/Output from previous day: 10/28 0701 - 10/29 0700 In: 3309.3 [I.V.:1907.3; IV Piggyback:1402] Out: [id number] [Urine:3470; Drains:800] Intake/Output this shift: Total I/O In: 729 [I.V.:237; IV Piggyback:492] Out: 800 [Urine:350; Drains:450]  Incision/Wound:vac in place ileostomy in place   Lab Results:   Recent Labs  02/11/16 0406 02/12/16 0555  WBC 22.0* 13.2*  HGB 8.5* 8.1*  HCT 24.7* 24.0*  PLT 314 265   BMET  Recent Labs  02/11/16 0406 02/12/16 0555  NA 134* 135  K 3.8 2.9*  CL 105 104  CO2 25 27  GLUCOSE 108* 99  BUN 10 5*  CREATININE 0.55* 0.33*  CALCIUM 7.0* 6.9*   PT/INR  Recent Labs  02/10/16 0800  LABPROT 17.7*  INR 1.44   ABG  Recent Labs  02/10/16 0257 02/11/16 0321  PHART 7.397 7.432  HCO3 21.0 25.2    Studies/Results: Dg Chest Port 1 View  Result Date: 02/12/2016 CLINICAL DATA:  Respiratory failure EXAM: PORTABLE CHEST 1 VIEW COMPARISON:  Chest radiograph from one day prior. FINDINGS: Endotracheal tube tip is 2.3 cm above the carina. Enteric tube terminates in the gastric fundus. Right internal jugular central venous catheter terminates at the cavoatrial junction. Right PICC terminates at the cavoatrial junction. Spinal fusion hardware overlies the lower thoracic spine. Stable cardiomediastinal silhouette with normal heart size. No pneumothorax. No pulmonary edema. Mild patchy left lung base opacity is stable. Probable stable trace left pleural effusion. IMPRESSION: 1. Well-positioned support structures as  described. No pneumothorax. 2. Stable mild patchy left lung base opacity and probable trace left pleural effusion. Electronically Signed   By: Ilona Sorrel M.D.   On: 02/12/2016 09:01   Dg Chest Port 1 View  Result Date: 02/11/2016 CLINICAL DATA:  Endotracheal tube placement. EXAM: PORTABLE CHEST 1 VIEW COMPARISON:  02/10/2016 FINDINGS: There is an endotracheal tube with the tip 2.3 cm above the carina. Left basilar chest tube in unchanged position. Right-sided PICC line with the tip projecting over the cavoatrial junction. Right IJ central venous catheter with the tip projecting over the right atrium. Small left pleural effusion and hazy left lower lobe airspace disease. Mild right lower lobe airspace disease likely reflecting atelectasis. No pneumothorax. Stable cardiomediastinal silhouette. Nasogastric tube in unchanged position. No acute osseous abnormality. IMPRESSION: 1. Endotracheal tube with the tip 2.3 cm above the carina. 2. Otherwise no significant interval change compared with 02/10/2016. Electronically Signed   By: Kathreen Devoid   On: 02/11/2016 08:42   Dg Chest Port 1 View  Result Date: 02/10/2016 CLINICAL DATA:  PICC line placement EXAM: PORTABLE CHEST 1 VIEW COMPARISON:  02/10/2016 FINDINGS: Right PICC line has been placed with the tip at the cavoatrial junction. Endotracheal tube, right central line, NG tube remain in place, unchanged. Bilateral lower lobe airspace opacities, left greater than right are again noted, stable. Probable small left pleural effusion. IMPRESSION: Right PICC line tip at the cavoatrial junction. Otherwise no change. Electronically Signed   By: Rolm Baptise M.D.   On: 02/10/2016 15:20    Anti-infectives: Anti-infectives  Start     Dose/Rate Route Frequency Ordered Stop   02/10/16 1500  anidulafungin (ERAXIS) 100 mg in sodium chloride 0.9 % 100 mL IVPB     100 mg over 90 Minutes Intravenous Every 24 hours 02/09/16 0804     02/10/16 1500  vancomycin  (VANCOCIN) IVPB 1000 mg/200 mL premix     1,000 mg 200 mL/hr over 60 Minutes Intravenous Every 8 hours 02/10/16 1239     02/10/16 1500  imipenem-cilastatin (PRIMAXIN) 1,000 mg in sodium chloride 0.9 % 250 mL IVPB     1,000 mg 250 mL/hr over 60 Minutes Intravenous Every 8 hours 02/10/16 1239     02/09/16 1000  imipenem-cilastatin (PRIMAXIN) 1,000 mg in sodium chloride 0.9 % 250 mL IVPB  Status:  Discontinued     1,000 mg 250 mL/hr over 60 Minutes Intravenous Every 8 hours 02/09/16 0812 02/10/16 1239   02/09/16 1000  vancomycin (VANCOCIN) IVPB 1000 mg/200 mL premix  Status:  Discontinued     1,000 mg 200 mL/hr over 60 Minutes Intravenous Every 8 hours 02/09/16 0812 02/10/16 1239   02/09/16 0900  anidulafungin (ERAXIS) 200 mg in sodium chloride 0.9 % 200 mL IVPB     200 mg over 180 Minutes Intravenous  Once 02/09/16 0804 02/09/16 1209   02/09/16 0830  anidulafungin (ERAXIS) 100 mg in sodium chloride 0.9 % 100 mL IVPB  Status:  Discontinued     100 mg over 90 Minutes Intravenous Every 24 hours 02/08/16 0811 02/09/16 0804   02/08/16 1800  vancomycin (VANCOCIN) IVPB 750 mg/150 ml premix  Status:  Discontinued     750 mg 150 mL/hr over 60 Minutes Intravenous Every 8 hours 02/08/16 0847 02/09/16 0812   02/08/16 1400  piperacillin-tazobactam (ZOSYN) IVPB 3.375 g  Status:  Discontinued     3.375 g 12.5 mL/hr over 240 Minutes Intravenous Every 8 hours 02/08/16 0432 02/08/16 0802   02/08/16 0900  imipenem-cilastatin (PRIMAXIN) 500 mg in sodium chloride 0.9 % 100 mL IVPB  Status:  Discontinued     500 mg 200 mL/hr over 30 Minutes Intravenous Every 6 hours 02/08/16 0818 02/09/16 0812   02/08/16 0830  ertapenem (INVANZ) 1 g in sodium chloride 0.9 % 50 mL IVPB  Status:  Discontinued     1 g 100 mL/hr over 30 Minutes Intravenous Every 24 hours 02/08/16 0756 02/08/16 0812   02/08/16 0830  anidulafungin (ERAXIS) 200 mg in sodium chloride 0.9 % 200 mL IVPB  Status:  Discontinued     200 mg over 180  Minutes Intravenous  Once 02/08/16 0811 02/09/16 1457   02/08/16 0830  vancomycin (VANCOCIN) 1,500 mg in sodium chloride 0.9 % 500 mL IVPB  Status:  Discontinued     1,500 mg 250 mL/hr over 120 Minutes Intravenous  Once 02/08/16 0815 02/09/16 1355   02/08/16 0815  vancomycin (VANCOCIN) 1,500 mg in sodium chloride 0.9 % 500 mL IVPB  Status:  Discontinued     1,500 mg 250 mL/hr over 120 Minutes Intravenous Every 12 hours 02/08/16 0811 02/08/16 0812   02/08/16 0430  piperacillin-tazobactam (ZOSYN) IVPB 3.375 g     3.375 g 100 mL/hr over 30 Minutes Intravenous  Once 02/08/16 0419 02/08/16 0532      Assessment/Plan: s/p Procedure(s): EXPLORATORY LAPAROTOMY (N/A) ILEOSTOMY (N/A) Patient Active Problem List   Diagnosis Date Noted  . Sepsis ([place])   . Abdominal infection ([place]) 02/08/2016  . Peritonitis ([place])   . Small bowel perforation ([place]) 01/27/2016   OR today  for vac change /  Closure of abdomen    Operative risks include bleeding,  Infection,  Organ injury,  Nerve injury,  Blood vessel injury,  DVT,  Pulmonary embolism,  Death,  And possible reoperation.  Medical management risks include worsening of present situation.  The success of the procedure is 50 -90 % at treating patients symptoms.  The patient understands and agrees to proceed.  LOS: 4 days    Roger Lucy A. 02/12/2016

## 2016-02-12 NOTE — Progress Notes (Signed)
Pt arrived back to unit from OR. RT placed pt back on full vent support.

## 2016-02-12 NOTE — Transfer of Care (Signed)
Immediate Anesthesia Transfer of Care Note  Patient: [name]  Procedure(s) Performed: Procedure(s): EXPLORATORY LAPAROTOMY WITH CLOSURE (N/A)  Patient Location: ICU  Anesthesia Type:General  Level of Consciousness: sedated  Airway & Oxygen Therapy: Patient remains intubated per anesthesia plan and Patient placed on Ventilator (see vital sign flow sheet for setting)  Post-op Assessment: Report given to RN and Post -op Vital signs reviewed and stable  Post vital signs: Reviewed and stable  Last Vitals:  Vitals:   02/12/16 1211 02/12/16 1431  BP: 119/70 135/73  Pulse: 84 91  Resp: 14 14  Temp:      Last Pain:  Vitals:   02/12/16 1144  TempSrc: Oral  PainSc:          Complications: No apparent anesthesia complications

## 2016-02-12 NOTE — Progress Notes (Signed)
Pt taken off vent by CRNA for transport to OR by ambu bag.

## 2016-02-12 NOTE — Progress Notes (Addendum)
PHARMACY - ADULT TOTAL PARENTERAL NUTRITION CONSULT NOTE   Pharmacy Consult for TPN Indication: Ischemic bowel and severe malnutrition  Patient Measurements: Height: 5\' 9"  (175.3 cm) Weight: 180 lb 8.9 oz (81.9 kg) IBW/kg (Calculated) : 70.7 TPN AdjBW (KG): 71.7 Body mass index is 26.66 kg/m.  Assessment:  50 year old male with history of small bowel perforation on 01/27/16 s/p resection and subsequent wound dehiscence and was discharged on 02/02/16. Patient was readmitted on 02/08/16 having developed septic shock with ischemic bowel and abscess and was taken to OR 02/08/16 for exploratory surgery with wound vac placement and removal of ileocecum with open abdomen. Patient noted to have severe malnutrition, muscle depletion, and severe edema as he had not eaten any food and had only been drinking water since discharge. Pharmacy consulted for TPN.  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 Severe malnutrition albumin 1.1 (prealbumin <5), high risk for refeeding. Ileostomy output 50 mL. Planned closure of abdomen today.  Endo: No history of DM. CBGs 81-99. No SSI needed.  Insulin requirements in the past 24 hours: 0 Lytes: Na up some 135. K down 2.9 after Lasix 20mg  given yesterday- 6 runs KCl this am. Phos improved 2.7. Magnesium 1.7- will replace. Renal: Scr 0.33 (low in setting of muscle depletion). Lasix 20mg  IV x1 on 10/28 >>UOP ~1.8 cc/kg/hr. I/O -1.1L last 24 hours. Net + 10L for admission. Pulm: Patient is intubated requiring FiO2 40%.  Cards: Currently off pressors. NSR. BP improving. MAP >65.  Hepatobil: LFTs are within normal limits. TG 163 baseline. Albumin 1.1 and pre-albumin <5.  Neuro: Currently sedated with Fentanyl at 350 mcg/hr and Midazolam at 4 mg/hr. RASS - ID: Imipenem and Vancomycin day#5 and Anidulafungin day #4 for intra-abdominal abscess and diffuse peritonitis. + Ecoli  in wound culture. Blood cultures still pending. WBC down 13.2, PCT elevated 31.88-not repeated, and Tmax 100.3. LA down to normal.  Best Practices: Lovenox 30, SCDs, Pepcid IV (will d/c and add to TPN), oral care TPN Access: CVC -R IJ triple lumen (plan to change to quad lumen) TPN start date: 02/10/16  Nutritional Goals (per RD recommendation on 10/27): KCal: 1800 Protein: 130-150 grams Goal: Clinimix E 5/15 at 110 mL/hr - provides 100% protein needs & 100% kcal needs (this may be too much volume for this patient and may need to be adjusted to prevent overfeeding when Lipids initiated)  Current Nutrition: NPO  Plan:  Increase Clinimix E 5/15  to 83 mL/hr. Hold 20% lipid emulsion for first 7 days for ICU patients per ASPEN guidelines (Start date 02/16/16) This currently provides 99.6 g of protein and 1414 kCals per day meeting 77% of protein and 78.5% of kCal needs. Add MVI and trace elements in TPN Continue sensitive SSI Q6H and adjust as needed Continue Pepcid 40 mg in TPN bag Give Magnesium 2g IV x1 today. NaPhos 10 mmol IV x1 today.  Repeat BMET this PM after KCl runs and supplement K further if remains low.  Monitor closely for refeeding. Monitor TPN labs - orders in place for regular Monday TPN labs F/U ability to feed enterally  Sloan Leiter, PharmD, BCPS Clinical Pharmacist [phone] 02/12/2016,7:41 AM

## 2016-02-12 NOTE — Op Note (Signed)
Preoperative diagnosis: Open abdomen secondary to sepsis  Postoperative diagnosis: Same  Procedure: Exploratory laparotomy with removal of vacuum pack dressing and closure of abdominal wall  Surgeon: Erroll Luna M.D.  Anesthesia: Gen.  EBL: Minimal  Specimen: None  Drains: None  Indications for procedure: Patient presents to the operating room for change of his open abdominal vacuum pack dressing. He had a subsequent ileocecectomy that leak postoperatively and required emergent expiration last week by Dr. Hulen Skains. He had his ileostomy matured in returns today with his open abdomen. He has undergone previous washout procedures at this point in time.  Informed consent was obtained. The hope was to be able to change d dressing and/or close his abdominal wall.The procedure has been discussed with the patient.  Alternative therapies have been discussed with the patient.  Operative risks include bleeding,  Infection,  Organ injury,  Nerve injury,  Blood vessel injury,  DVT,  Pulmonary embolism,  Death,  And possible reoperation.  Medical management risks include worsening of present situation.  The success of the procedure is 50 -90 % at treating patients symptoms.  The patient understands and agrees to proceed.   Description of procedure: The patient was brought from the ICU intubated. Gen. anesthesia was initiated after placing the patient only operative room table. His old vacuum pack dressing was removed. His ileostomy bag was removed. The abdomen was prepped and draped in a sterile fashion timeout was done. Upon removal the abdominal pack dressing, we irrigated the abdominal cavity. There was no evidence of any further contamination or infection. Intestine appeared viable and his ileostomy was pink and functioning well. I began to close the fascia of the abdominal wall after ensuring that her counts are correct with #1 Novafil pop off. I was able to completely close the abdominal wall with  interrupted #1 Novafil. I placed a few intermittent to O Ethibond sutures through the skin at various areas where was concerned about tension to help reinforce this. The remainder the wound was open and packed with saline soaked gauze. Dry dressings were applied. Patient taken intubated to ICU in stable but critical condition. All counts are correct.

## 2016-02-12 NOTE — Anesthesia Postprocedure Evaluation (Signed)
Anesthesia Post Note  Patient: [name]  Procedure(s) Performed: Procedure(s) (LRB): EXPLORATORY LAPAROTOMY WITH CLOSURE (N/A)  Patient location during evaluation: SICU Anesthesia Type: General Level of consciousness: sedated Pain management: pain level controlled Vital Signs Assessment: post-procedure vital signs reviewed and stable Respiratory status: patient remains intubated per anesthesia plan Cardiovascular status: stable Anesthetic complications: no    Last Vitals:  Vitals:   02/12/16 1211 02/12/16 1431  BP: 119/70 135/73  Pulse: 84 91  Resp: 14 14  Temp:      Last Pain:  Vitals:   02/12/16 1144  TempSrc: Oral  PainSc:                  Zenaida Deed

## 2016-02-12 NOTE — Progress Notes (Signed)
PHARMACY - ADULT TOTAL PARENTERAL NUTRITION CONSULT NOTE   Pharmacy Consult for TPN Indication: Ischemic bowel and severe malnutrition  Patient Measurements: Height: 5\' 9"  (175.3 cm) Weight: 180 lb 8.9 oz (81.9 kg) IBW/kg (Calculated) : 70.7 TPN AdjBW (KG): 71.7 Body mass index is 26.66 kg/m.  Assessment:  50 year old male with history of small bowel perforation on 01/27/16 s/p resection and subsequent wound dehiscence and was discharged on 02/02/16. Patient was readmitted on 02/08/16 having developed septic shock with ischemic bowel and abscess and was taken to OR 02/08/16 for exploratory surgery with wound vac placement and removal of ileocecum with open abdomen. Patient noted to have severe malnutrition, muscle depletion, and severe edema as he had not eaten any food and had only been drinking water since discharge. Pharmacy consulted for TPN.  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 Severe malnutrition albumin 1.1 (prealbumin <5), high risk for refeeding. Ileostomy output 50 mL. Planned closure of abdomen today.  Endo: No history of DM. CBGs 81-99. No SSI needed.  Insulin requirements in the past 24 hours: 0 Lytes: Na up some 135. K down 2.9 after Lasix 20mg  given yesterday- 6 runs KCl this am. Phos improved 2.7. Magnesium 1.7- will replace. Renal: Scr 0.33 (low in setting of muscle depletion). Lasix 20mg  IV x1 on 10/28 >>UOP ~1.8 cc/kg/hr. I/O -1.1L last 24 hours. Net + 10L for admission. Pulm: Patient is intubated requiring FiO2 40%.  Cards: Currently off pressors. NSR. BP improving. MAP >65.  Hepatobil: LFTs are within normal limits. TG 163 baseline. Albumin 1.1 and pre-albumin <5.  Neuro: Currently sedated with Fentanyl at 350 mcg/hr and Midazolam at 4 mg/hr. RASS - ID: Imipenem and Vancomycin day#5 and Anidulafungin day #4 for intra-abdominal abscess and diffuse peritonitis. + Ecoli  in wound culture. Blood cultures still pending. WBC down 13.2, PCT elevated 31.88-not repeated, and Tmax 100.3. LA down to normal.  Best Practices: Lovenox 30, SCDs, Pepcid IV (will d/c and add to TPN), oral care TPN Access: CVC -R IJ triple lumen (plan to change to quad lumen) TPN start date: 02/10/16  Nutritional Goals (per RD recommendation on 10/27): KCal: 1800 Protein: 130-150 grams Goal: Clinimix E 5/15 at 110 mL/hr - provides 100% protein needs & 100% kcal needs (this may be too much volume for this patient and may need to be adjusted to prevent overfeeding when Lipids initiated)  Current Nutrition: NPO  Plan:  Increase Clinimix E 5/15  to 83 mL/hr. Hold 20% lipid emulsion for first 7 days for ICU patients per ASPEN guidelines (Start date 02/16/16) This currently provides 99.6 g of protein and 1414 kCals per day meeting 77% of protein and 78.5% of kCal needs. Add MVI and trace elements in TPN Continue sensitive SSI Q6H and adjust as needed Continue Pepcid 40 mg in TPN bag Give Magnesium 2g IV x1 today. NaPhos 10 mmol IV x1 today.  Repeat BMET this PM after KCl runs and supplement K further if remains low.  Monitor closely for refeeding. Monitor TPN labs - orders in place for regular Monday TPN labs F/U ability to feed enterally  Sloan Leiter, PharmD, BCPS Clinical Pharmacist [phone] 02/12/2016,6:32 PM    ADDENDUM:  Potassium up slightly from 2.9 to 3.0 after 6 runs of potassium this AM.   Plan: Give additional K runs x 6 tonight F/u TPN labs in the AM   Elicia Lamp, PharmD, BCPS Clinical Pharmacist 02/12/2016  6:34 PM

## 2016-02-12 NOTE — Progress Notes (Signed)
eLink Physician-Brief Progress Note Patient Name: NOLEN SIRACUSA DOB: [birth year] MRN: [id number]   Date of Service  02/12/2016  HPI/Events of Note  Hypokalemia  eICU Interventions  Potassium replaced     Intervention Category Intermediate Interventions: Electrolyte abnormality - evaluation and management  Andree Golphin 02/12/2016, 6:54 AM

## 2016-02-12 NOTE — Care Management (Addendum)
CM submitted clinicals to Bo Merino, Worker's Comp Case Freight forwarder.  (phone: [phone], fax:  [phone] per request on 02/10/16

## 2016-02-13 ENCOUNTER — Encounter (HOSPITAL_COMMUNITY): Payer: Self-pay | Admitting: General Surgery

## 2016-02-13 ENCOUNTER — Inpatient Hospital Stay (HOSPITAL_COMMUNITY): Payer: Worker's Compensation

## 2016-02-13 LAB — BLOOD GAS, ARTERIAL
Acid-Base Excess: 8.2 mmol/L — ABNORMAL HIGH (ref 0.0–2.0)
BICARBONATE: 31.7 mmol/L — AB (ref 20.0–28.0)
Drawn by: 398661
FIO2: 0.4
LHR: 14 {breaths}/min
MECHVT: 570 mL
O2 SAT: 98.3 %
PATIENT TEMPERATURE: 98.6
PCO2 ART: 40.5 mmHg (ref 32.0–48.0)
PEEP/CPAP: 5 cmH2O
PO2 ART: 124 mmHg — AB (ref 83.0–108.0)
pH, Arterial: 7.505 — ABNORMAL HIGH (ref 7.350–7.450)

## 2016-02-13 LAB — CULTURE, BLOOD (ROUTINE X 2)
CULTURE: NO GROWTH
Culture: NO GROWTH

## 2016-02-13 LAB — COMPREHENSIVE METABOLIC PANEL
ALBUMIN: 1.1 g/dL — AB (ref 3.5–5.0)
ALT: 17 U/L (ref 17–63)
AST: 24 U/L (ref 15–41)
Alkaline Phosphatase: 139 U/L — ABNORMAL HIGH (ref 38–126)
Anion gap: 6 (ref 5–15)
BILIRUBIN TOTAL: 0.5 mg/dL (ref 0.3–1.2)
BUN: 5 mg/dL — ABNORMAL LOW (ref 6–20)
CO2: 28 mmol/L (ref 22–32)
Calcium: 6.8 mg/dL — ABNORMAL LOW (ref 8.9–10.3)
Chloride: 97 mmol/L — ABNORMAL LOW (ref 101–111)
Creatinine, Ser: 0.41 mg/dL — ABNORMAL LOW (ref 0.61–1.24)
GFR calc non Af Amer: >60 mL/min (ref >60)
GLUCOSE: 122 mg/dL — AB (ref 65–99)
POTASSIUM: 3.2 mmol/L — AB (ref 3.5–5.1)
SODIUM: 131 mmol/L — AB (ref 135–145)
TOTAL PROTEIN: 3.8 g/dL — AB (ref 6.5–8.1)

## 2016-02-13 LAB — GLUCOSE, CAPILLARY
GLUCOSE-CAPILLARY: 121 mg/dL — AB (ref 65–99)
GLUCOSE-CAPILLARY: 129 mg/dL — AB (ref 65–99)
GLUCOSE-CAPILLARY: 143 mg/dL — AB (ref 65–99)
GLUCOSE-CAPILLARY: 86 mg/dL (ref 65–99)
Glucose-Capillary: 181 mg/dL — ABNORMAL HIGH (ref 65–99)
Glucose-Capillary: 117 mg/dL — ABNORMAL HIGH (ref 65–99)

## 2016-02-13 LAB — CBC
HEMATOCRIT: 29.6 % — AB (ref 39.0–52.0)
Hemoglobin: 10 g/dL — ABNORMAL LOW (ref 13.0–17.0)
MCH: 27.3 pg (ref 26.0–34.0)
MCHC: 33.8 g/dL (ref 30.0–36.0)
MCV: 80.9 fL (ref 78.0–100.0)
PLATELETS: 248 10*3/uL (ref 150–400)
RBC: 3.66 MIL/uL — ABNORMAL LOW (ref 4.22–5.81)
RDW: 15.1 % (ref 11.5–15.5)
WBC: 13.7 10*3/uL — ABNORMAL HIGH (ref 4.0–10.5)

## 2016-02-13 LAB — AEROBIC/ANAEROBIC CULTURE (SURGICAL/DEEP WOUND)

## 2016-02-13 LAB — AEROBIC/ANAEROBIC CULTURE W GRAM STAIN (SURGICAL/DEEP WOUND)

## 2016-02-13 LAB — DIFFERENTIAL
BASOS PCT: 0 %
Basophils Absolute: 0 10*3/uL (ref 0.0–0.1)
EOS PCT: 1 %
Eosinophils Absolute: 0.1 10*3/uL (ref 0.0–0.7)
LYMPHS ABS: 1.4 10*3/uL (ref 0.7–4.0)
Lymphocytes Relative: 10 %
MONO ABS: 2.2 10*3/uL — AB (ref 0.1–1.0)
MONOS PCT: 16 %
Neutro Abs: 10 10*3/uL — ABNORMAL HIGH (ref 1.7–7.7)
Neutrophils Relative %: 73 %

## 2016-02-13 LAB — PREALBUMIN: Prealbumin: <5 mg/dL — ABNORMAL LOW (ref 18–38)

## 2016-02-13 LAB — PHOSPHORUS: Phosphorus: 3.2 mg/dL (ref 2.5–4.6)

## 2016-02-13 LAB — TOBRAMYCIN LEVEL, RANDOM: TOBRAMYCIN RM: 3.2 ug/mL

## 2016-02-13 LAB — MAGNESIUM: Magnesium: 1.7 mg/dL (ref 1.7–2.4)

## 2016-02-13 LAB — TRIGLYCERIDES: TRIGLYCERIDES: 101 mg/dL (ref <150)

## 2016-02-13 MED ORDER — CHLORHEXIDINE GLUCONATE 0.12 % MT SOLN
15.0000 mL | Freq: Two times a day (BID) | OROMUCOSAL | Status: DC
  Administered 2016-02-13 – 2016-02-14 (×2): 15 mL via OROMUCOSAL

## 2016-02-13 MED ORDER — DEXTROSE 5 % IV SOLN
2.0000 g | INTRAVENOUS | Status: DC
  Administered 2016-02-13 – 2016-02-14 (×2): 2 g via INTRAVENOUS
  Filled 2016-02-13 (×2): qty 2

## 2016-02-13 MED ORDER — INSULIN ASPART 100 UNIT/ML ~~LOC~~ SOLN
0.0000 [IU] | Freq: Two times a day (BID) | SUBCUTANEOUS | Status: DC
  Administered 2016-02-13: 1 [IU] via SUBCUTANEOUS

## 2016-02-13 MED ORDER — FUROSEMIDE 10 MG/ML IJ SOLN
20.0000 mg | Freq: Four times a day (QID) | INTRAMUSCULAR | Status: DC
  Administered 2016-02-13 – 2016-02-14 (×4): 20 mg via INTRAVENOUS
  Filled 2016-02-13 (×5): qty 2

## 2016-02-13 MED ORDER — HYDROMORPHONE HCL 1 MG/ML IJ SOLN
1.0000 mg | INTRAMUSCULAR | Status: DC | PRN
  Administered 2016-02-13 – 2016-02-16 (×16): 1 mg via INTRAVENOUS
  Filled 2016-02-13 (×16): qty 1

## 2016-02-13 MED ORDER — ENOXAPARIN SODIUM 40 MG/0.4ML ~~LOC~~ SOLN
40.0000 mg | SUBCUTANEOUS | Status: DC
  Administered 2016-02-14 – 2016-02-20 (×7): 40 mg via SUBCUTANEOUS
  Filled 2016-02-13 (×8): qty 0.4

## 2016-02-13 MED ORDER — ONDANSETRON HCL 4 MG/2ML IJ SOLN
4.0000 mg | Freq: Four times a day (QID) | INTRAMUSCULAR | Status: DC | PRN
  Administered 2016-02-13 – 2016-02-14 (×2): 4 mg via INTRAVENOUS
  Filled 2016-02-13 (×2): qty 2

## 2016-02-13 MED ORDER — ORAL CARE MOUTH RINSE
15.0000 mL | Freq: Two times a day (BID) | OROMUCOSAL | Status: DC
  Administered 2016-02-14 (×2): 15 mL via OROMUCOSAL

## 2016-02-13 MED ORDER — TRACE MINERALS CR-CU-MN-SE-ZN 10-1000-500-60 MCG/ML IV SOLN
INTRAVENOUS | Status: DC
  Administered 2016-02-13: 18:00:00 via INTRAVENOUS
  Filled 2016-02-13: qty 1992

## 2016-02-13 MED ORDER — TOBRAMYCIN SULFATE 80 MG/2ML IJ SOLN
7.0000 mg/kg | INTRAVENOUS | Status: DC
  Administered 2016-02-13: 530 mg via INTRAVENOUS
  Filled 2016-02-13 (×3): qty 13.25

## 2016-02-13 MED ORDER — MAGNESIUM SULFATE 4 GM/100ML IV SOLN
4.0000 g | Freq: Once | INTRAVENOUS | Status: AC
  Administered 2016-02-13: 4 g via INTRAVENOUS
  Filled 2016-02-13 (×2): qty 100

## 2016-02-13 MED ORDER — POTASSIUM CHLORIDE 10 MEQ/50ML IV SOLN
10.0000 meq | INTRAVENOUS | Status: AC
  Administered 2016-02-13 (×6): 10 meq via INTRAVENOUS
  Filled 2016-02-13 (×6): qty 50

## 2016-02-13 NOTE — Progress Notes (Signed)
Pharmacy Antibiotic Note  [name] is a 50 y.o. male admitted on 02/08/2016 with intra-abdominal infection. Pharmacy has been consulted for tobramycin dosing. Patient is also on Eraxis. Patient is febrile (Tmax 101.8), WBC 13.7, SCr is at baseline to 0.41, and lactic acid is wnl. Acinetobacter from peritoneal fluid susceptible to tobramycin only.   Plan: - Stop vancomycin and Primaxin - Continue Eraxis 100 mg q24h per MD - Stop date 11/2 - Start ceftriaxone 2g q24h - Stop date 11/3 - Start tobramycin 7mg /kg q24h - Obtain tobramycin level 10 hours after first dose - Follow-up renal fxn, C&S, clinical status   Height: 5\' 9"  (175.3 cm) Weight: 182 lb 1.6 oz (82.6 kg) IBW/kg (Calculated) : 70.7  Temp (24hrs), Avg:99.8 F (37.7 C), Min:98.7 F (37.1 C), Max:101.8 F (38.8 C)   Recent Labs Lab 02/08/16 0416 02/08/16 0652 02/08/16 1315  02/09/16 0515 02/10/16 0402 02/10/16 0935 02/10/16 1928 02/11/16 0406 02/12/16 0555 02/12/16 1734 02/13/16 0601  WBC  --   --   --   < > 28.9* 31.7*  --  27.0* 22.0* 13.2*  --  13.7*  CREATININE  --   --   --   < > 0.75 0.54*  --  0.46* 0.55* 0.33* 0.41* 0.41*  LATICACIDVEN 6.32* 6.78* 4.3*  --  2.4*  --   --   --   --  1.0  --   --   VANCOTROUGH  --   --   --   --   --   --  16  --   --   --   --   --   < > = values in this interval not displayed.  Estimated Creatinine Clearance: 111.7 mL/min (by C-G formula based on SCr of 0.41 mg/dL (L)).    Antimicrobials this admission: Vanc 10/25 >> 10/30 Primaxin 10/25 >> 10/30 Eraxis 10/26 >> Zosyn x1 10/25 Ceftriaxone 10/30 >> Tobramycin 10/30 >>  Dose adjustments this admission: 10/26 Change vancomycin to 1g q8h and Primaxin to 1g q8h  Microbiology results: 10/25 MRSA PCR - NEG 10/25 Blood - NGTD 10/25 Peritoneal - E. Coli and Acinetobacter   10/25 Urine - No growth   Angelena Form, PharmD Candidate 02/13/2016 11:20 AM

## 2016-02-13 NOTE — Progress Notes (Signed)
1 Day Post-Op  Subjective: Patient remains intubated, sedated No pressors No events overnight except fever  Objective: Vital signs in last 24 hours: Temp:  [98.7 F (37.1 C)-101.8 F (38.8 C)] 99.4 F (37.4 C) (10/30 0708) Pulse Rate:  [78-115] 104 (10/30 0600) Resp:  [14-26] 18 (10/30 0600) BP: (109-144)/(70-101) 123/80 (10/30 0600) SpO2:  [99 %-100 %] 100 % (10/30 0600) Arterial Line BP: (115-153)/(62-76) 129/64 (10/30 0600) FiO2 (%):  [40 %] 40 % (10/30 0400) Weight:  [82.6 kg (182 lb 1.6 oz)] 82.6 kg (182 lb 1.6 oz) (10/30 0350) Last BM Date: 02/12/16  Intake/Output from previous day: 10/29 0701 - 10/30 0700 In: [id number] [I.V.:2347; IV Piggyback:2406] Out: 6310 [Urine:5835; Drains:450; Blood:25] Intake/Output this shift: No intake/output data recorded.  Intubated, sedated Follows some commands Lungs - CTA B CV - tachycardic; reg rhythm Abd - distended; quiet; wound intermittently closed with sutures Subcutaneous tissue pink; serous drainage Ileostomy pink with some edema  Lab Results:   Recent Labs  02/12/16 0555 02/13/16 0601  WBC 13.2* 13.7*  HGB 8.1* 10.0*  HCT 24.0* 29.6*  PLT 265 248   BMET  Recent Labs  02/12/16 1734 02/13/16 0601  NA 132* 131*  K 3.0* 3.2*  CL 98* 97*  CO2 28 28  GLUCOSE 114* 122*  BUN <5* 5*  CREATININE 0.41* 0.41*  CALCIUM 7.0* 6.8*   PT/INR  Recent Labs  02/10/16 0800  LABPROT 17.7*  INR 1.44   ABG  Recent Labs  02/11/16 0321 02/13/16 0352  PHART 7.432 7.505*  HCO3 25.2 31.7*    Studies/Results: Dg Chest Port 1 View  Result Date: 02/13/2016 CLINICAL DATA:  Followup endotracheal support. EXAM: PORTABLE CHEST 1 VIEW COMPARISON:  02/12/2016 FINDINGS: Endotracheal tube has its tip 2 cm above the carina. Nasogastric tube enters stomach. Right internal jugular central line has its tip in the SVC above the right atrium. Right arm PICC has its tip in the proximal right atrium. The upper lobes remain clear. There  has been worsened volume loss in the lower lobes since yesterday's velum, more similar to the exam of 2 days ago. IMPRESSION: Slight worsening of infiltrate/atelectasis in both lower lobes since yesterday's film, similar to the exam of 2 days ago. Electronically Signed   By: Nelson Chimes M.D.   On: 02/13/2016 06:57   Dg Chest Port 1 View  Result Date: 02/12/2016 CLINICAL DATA:  Respiratory failure EXAM: PORTABLE CHEST 1 VIEW COMPARISON:  Chest radiograph from one day prior. FINDINGS: Endotracheal tube tip is 2.3 cm above the carina. Enteric tube terminates in the gastric fundus. Right internal jugular central venous catheter terminates at the cavoatrial junction. Right PICC terminates at the cavoatrial junction. Spinal fusion hardware overlies the lower thoracic spine. Stable cardiomediastinal silhouette with normal heart size. No pneumothorax. No pulmonary edema. Mild patchy left lung base opacity is stable. Probable stable trace left pleural effusion. IMPRESSION: 1. Well-positioned support structures as described. No pneumothorax. 2. Stable mild patchy left lung base opacity and probable trace left pleural effusion. Electronically Signed   By: Ilona Sorrel M.D.   On: 02/12/2016 09:01    Anti-infectives: Anti-infectives    Start     Dose/Rate Route Frequency Ordered Stop   02/10/16 1500  anidulafungin (ERAXIS) 100 mg in sodium chloride 0.9 % 100 mL IVPB     100 mg over 90 Minutes Intravenous Every 24 hours 02/09/16 0804     02/10/16 1500  vancomycin (VANCOCIN) IVPB 1000 mg/200 mL premix  1,000 mg 200 mL/hr over 60 Minutes Intravenous Every 8 hours 02/10/16 1239     02/10/16 1500  imipenem-cilastatin (PRIMAXIN) 1,000 mg in sodium chloride 0.9 % 250 mL IVPB     1,000 mg 250 mL/hr over 60 Minutes Intravenous Every 8 hours 02/10/16 1239     02/09/16 1000  imipenem-cilastatin (PRIMAXIN) 1,000 mg in sodium chloride 0.9 % 250 mL IVPB  Status:  Discontinued     1,000 mg 250 mL/hr over 60 Minutes  Intravenous Every 8 hours 02/09/16 0812 02/10/16 1239   02/09/16 1000  vancomycin (VANCOCIN) IVPB 1000 mg/200 mL premix  Status:  Discontinued     1,000 mg 200 mL/hr over 60 Minutes Intravenous Every 8 hours 02/09/16 0812 02/10/16 1239   02/09/16 0900  anidulafungin (ERAXIS) 200 mg in sodium chloride 0.9 % 200 mL IVPB     200 mg over 180 Minutes Intravenous  Once 02/09/16 0804 02/09/16 1209   02/09/16 0830  anidulafungin (ERAXIS) 100 mg in sodium chloride 0.9 % 100 mL IVPB  Status:  Discontinued     100 mg over 90 Minutes Intravenous Every 24 hours 02/08/16 0811 02/09/16 0804   02/08/16 1800  vancomycin (VANCOCIN) IVPB 750 mg/150 ml premix  Status:  Discontinued     750 mg 150 mL/hr over 60 Minutes Intravenous Every 8 hours 02/08/16 0847 02/09/16 0812   02/08/16 1400  piperacillin-tazobactam (ZOSYN) IVPB 3.375 g  Status:  Discontinued     3.375 g 12.5 mL/hr over 240 Minutes Intravenous Every 8 hours 02/08/16 0432 02/08/16 0802   02/08/16 0900  imipenem-cilastatin (PRIMAXIN) 500 mg in sodium chloride 0.9 % 100 mL IVPB  Status:  Discontinued     500 mg 200 mL/hr over 30 Minutes Intravenous Every 6 hours 02/08/16 0818 02/09/16 0812   02/08/16 0830  ertapenem (INVANZ) 1 g in sodium chloride 0.9 % 50 mL IVPB  Status:  Discontinued     1 g 100 mL/hr over 30 Minutes Intravenous Every 24 hours 02/08/16 0756 02/08/16 0812   02/08/16 0830  anidulafungin (ERAXIS) 200 mg in sodium chloride 0.9 % 200 mL IVPB  Status:  Discontinued     200 mg over 180 Minutes Intravenous  Once 02/08/16 0811 02/09/16 1457   02/08/16 0830  vancomycin (VANCOCIN) 1,500 mg in sodium chloride 0.9 % 500 mL IVPB  Status:  Discontinued     1,500 mg 250 mL/hr over 120 Minutes Intravenous  Once 02/08/16 0815 02/09/16 1355   02/08/16 0815  vancomycin (VANCOCIN) 1,500 mg in sodium chloride 0.9 % 500 mL IVPB  Status:  Discontinued     1,500 mg 250 mL/hr over 120 Minutes Intravenous Every 12 hours 02/08/16 0811 02/08/16 0812    02/08/16 0430  piperacillin-tazobactam (ZOSYN) IVPB 3.375 g     3.375 g 100 mL/hr over 30 Minutes Intravenous  Once 02/08/16 0419 02/08/16 0532      Assessment/Plan: Crohns disease s/p ileocecectomy 01/27/16 for SB perforation Anastomotic leak/ peritoneal abscess - s/p exp lap/ resection of anastomosis 02/08/16 Washout/ VAC change 02/10/16 Washout/ fascial closure 02/12/16  Improving Ostomy pink  May begin tube feeds/ wean TNA May wean from vent - no plans for return to OR Wet to dry dressings to midline wound   LOS: 5 days    Roger Janssens K. 02/13/2016

## 2016-02-13 NOTE — Progress Notes (Addendum)
PHARMACY - ADULT TOTAL PARENTERAL NUTRITION CONSULT NOTE   Pharmacy Consult: TPN Indication: Ischemic bowel and severe malnutrition  Patient Measurements: Height: 5' 9"  (175.3 cm) Weight: 182 lb 1.6 oz (82.6 kg) IBW/kg (Calculated) : 70.7 TPN AdjBW (KG): 71.7 Body mass index is 26.89 kg/m.  Assessment:  12 YOM with history of small bowel perforation on 01/27/16 s/p resection and subsequent wound dehiscence and was discharged on 02/02/16. Patient was readmitted on 02/08/16 having developed septic shock with ischemic bowel/abscess and was taken to OR 02/08/16 for ex-lap with wound vac placement and removal of ileocecum with open abdomen. Patient noted to have severe malnutrition, muscle depletion, and severe edema as he had not eaten any food and had only been drinking water since previous discharge.  Pharmacy consulted for TPN.  GI: Pepcid in TPN.  Baseline prealbumin <5, O/P 410m, LBM 10/29.   *10/27 - re-exploration, ileostomy, but no closure *10/29 - abd closure Endo: no hx DM - CBGs low normal Insulin requirements in the past 24 hours: 1 unit sensitive SSI Lytes: high refeeding risk - low Na/CL 131/97, K+ 3.2, Mag low normal at 1.7, others WNL Renal: SCr 0.41 (muscle depletion), Lasix x3 doses, UOP 2.9 ml/kg/hr, net positive 8.7L since admit Pulm: intubated, FiO2 40% Cards: off pressors - VSS  Hepatobil: LFTs WNL except alk phos at 139.  TG WNL. Neuro: Fentanyl/Versed gtts - GCS 13, CPOT 0, RASS 1 (goal 0) ID: Vanc/Primaxin D#6 + Eraxis D#5 for intra-abd abscess and diffuse peritonitis + Ecoli in wound culture.  Tmax 101.8, WBC 13.7, CXR shows worsening infiltrate/atelectasis, BCx NGTD Best Practices: Lovenox 30, SCDs, Pepcid in TPN) oral care TPN Access: PICC placed 02/10/16, CVC placed 02/08/16 TPN start date: 02/10/16  Nutritional Goals: 1800 kCal and 130-150 gm protein per day  Current Nutrition:  TPN   Plan:  - Hold lipid for first 7 days of TPN for ICU patients per  ASPEN/SCCM guidelines (start date 02/17/16) - Continue Clinimix E 5/15 at 83 ml/hr, providing 1414 kCal and 100gm of protein per day, meeting 79% of kCal and 77% of protein needs.  Capping Clinimix at this rate to minimize waste during time of critical shortage, and given plan to start TF per Surgery. - Daily multivitamin and trace elements in TPN - Pepcid 486min TPN - KCL x 6 runs.  Need additional supplementation if Lasix is continued. - Mag sulfate 4gm IV x 1 - Change CBG checks to Q12H and adjust SSI to give only when CBGs > 150. - F/U AM labs   Hildegard Hlavac D. DaMina MarblePharmD, BCPS Pager:  [phone]/30/2017, 7:41 AM

## 2016-02-13 NOTE — Progress Notes (Signed)
Tobramycin level drawn 10hr post dose = 3.2 falls within 24hr dosing interval on nomgram No change Continue tobramycin 530mg  q 24hr  Bonnita Nasuti Pharm.D. CPP, BCPS Clinical Pharmacist [phone] 02/13/2016 10:34 PM

## 2016-02-13 NOTE — Progress Notes (Signed)
PULMONARY / CRITICAL CARE MEDICINE   Name: [name] MRN: [id number] DOB: 1965/12/13    ADMISSION DATE:  02/08/2016 CONSULTATION DATE:  02/08/16  REFERRING MD:  ED  CHIEF COMPLAINT:  Abdominal pain  Brief History:   [name] is a 50 year old white male with a past medical history of small bowel perforation on 01/27/16 s/p resection with subsequent wound dehisence. He was d/c on 02/02/16. Developed septic shock, ischemic bowel, abscess. Pt was taken to the OR on 02/08/16 for exploratory surgery. Drained abscess, removed ileocecum.  SUBJECTIVE:  Intubated and awake. He is alert but still requiring high amounts of pain medication. He returned to the OR 10/29 for abdomen closure. The wound is covered wet to dry and remains clean.   VITAL SIGNS: BP 123/80   Pulse (!) 104   Temp 99.4 F (37.4 C) (Oral)   Resp 18   Ht 5\' 9"  (1.753 m)   Wt 82.6 kg (182 lb 1.6 oz)   SpO2 100%   BMI 26.89 kg/m   HEMODYNAMICS:    VENTILATOR SETTINGS: Vent Mode: PRVC FiO2 (%):  [40 %] 40 % Set Rate:  [14 bmp] 14 bmp Vt Set:  [570 mL] 570 mL PEEP:  [5 cmH20] 5 cmH20 Plateau Pressure:  [15 cmH20-19 cmH20] 18 cmH20  INTAKE / OUTPUT: I/O last 3 completed shifts: In: [id number] [I.V.:3297; IV Piggyback:3056] Out: [id number]; Drains:600; Blood:25]  PHYSICAL EXAMINATION: General:  Intubated, alert but mildly sedated Neuro:  CN II-VII grossly intact, opens eyes, nods head, responds to commands HEENT:  PEERL Cardiovascular:  Tachycardic, regular rhythm, no mrg, pitting edema to hips and scrotum Lungs:  Clear, coarse sounds at bases Abdomen:  Clean wound in center of abdomen, peritoneum closed, no erythema, ileostomy present, no bowel sounds present Musculoskeletal:  Moves all 4 extremities  Skin:  Warms, dry, intact  LABS:  BMET  Recent Labs Lab 02/12/16 0555 02/12/16 1734 02/13/16 0601  NA 135 132* 131*  K 2.9* 3.0* 3.2*  CL 104 98* 97*  CO2 27 28 28   BUN 5* <5* 5*  CREATININE  0.33* 0.41* 0.41*  GLUCOSE 99 114* 122*    Electrolytes  Recent Labs Lab 02/11/16 0406 02/12/16 0555 02/12/16 1734 02/13/16 0601  CALCIUM 7.0* 6.9* 7.0* 6.8*  MG 2.1 1.7  --  1.7  PHOS 1.8* 2.7  --  3.2    CBC  Recent Labs Lab 02/11/16 0406 02/12/16 0555 02/13/16 0601  WBC 22.0* 13.2* 13.7*  HGB 8.5* 8.1* 10.0*  HCT 24.7* 24.0* 29.6*  PLT 314 265 248    Coag's  Recent Labs Lab 02/08/16 0928 02/09/16 0830 02/10/16 0800  APTT 33  --   --   INR 1.71 1.67 1.44    Sepsis Markers  Recent Labs Lab 02/08/16 0928 02/08/16 1315 02/09/16 0515 02/12/16 0555  LATICACIDVEN  --  4.3* 2.4* 1.0  PROCALCITON 31.88  --   --  3.37    ABG  Recent Labs Lab 02/10/16 0257 02/11/16 0321 02/13/16 0352  PHART 7.397 7.432 7.505*  PCO2ART 34.1 38.4 40.5  PO2ART 64.0* 94.6 124*    Liver Enzymes  Recent Labs Lab 02/09/16 0515 02/10/16 0402 02/13/16 0601  AST 26 23 24   ALT 18 17 17   ALKPHOS 97 114 139*  BILITOT 0.5 0.3 0.5  ALBUMIN 1.2* 1.1* 1.1*    Cardiac Enzymes  Recent Labs Lab 02/08/16 0928  TROPONINI 0.03*    Glucose  Recent Labs Lab 02/12/16 1143 02/12/16 1624 02/12/16  1730 02/12/16 2014 02/12/16 2329 02/13/16 0559  GLUCAP 105* 110* 130* 99 98 86    Imaging Dg Chest Port 1 View  Result Date: 02/13/2016 CLINICAL DATA:  Followup endotracheal support. EXAM: PORTABLE CHEST 1 VIEW COMPARISON:  02/12/2016 FINDINGS: Endotracheal tube has its tip 2 cm above the carina. Nasogastric tube enters stomach. Right internal jugular central line has its tip in the SVC above the right atrium. Right arm PICC has its tip in the proximal right atrium. The upper lobes remain clear. There has been worsened volume loss in the lower lobes since yesterday's velum, more similar to the exam of 2 days ago. IMPRESSION: Slight worsening of infiltrate/atelectasis in both lower lobes since yesterday's film, similar to the exam of 2 days ago. Electronically Signed   By:  Nelson Chimes M.D.   On: 02/13/2016 06:57    STUDIES: CT abd/pelvis 10/25- >>ascites with suspected peritonitis. Irregular 16 x 12 cm early pelvic abscess. Edematous dilated small bowel favoring ileus. Wedge-like hypodensity in dome of the liver with additional hepatic hypodensities  XR chest/abdomen 10/25 >>Small LEFT pleural effusion and atelectasis. Scattered air-fluid levels with paucity of large bowel gas concerning for early or partial small bowel obstruction  CXR 10/30 >> Slight worsening of infiltrate/atelectasis in both lower lobes  CULTURES: Blood cultures 10/25 >> pending, no growth at 12 hrs Wound clx 10/25 >>e coli (sens to imipen)  UCX 10/25 >neg   ANTIBIOTICS: 10/25 vanc>>>10/30 10/25 imipenem>>>10/30 10/30 ceftriaxone >>>plan stop 11/3 anidulafungin 02/08/16>>>11/2  SIGNIFICANT EVENTS: Code sepsis called in ED Intubated and central access obtained 02/08/16 Exploratory surgery, wound vac placement and ileocectomy 02/08/16 Exploratory surgery-peritonitis , distal ileum ischemia , s/p resection w/ ileostomy placed 02/10/16 Exploratory laparotomy with removal of vacuum pack dressing and closure of abdominal wall 02/12/16  LINES/TUBES: ETT 02/08/16>>> Right IJ 02/08/16>>> PICC 02/10/16 >>  DISCUSSION: This is a 50 year old male with suspected ischemic bowel, taken to OR 02/08/16 emergently for exploration. He is septic, on broad spectrum abx and antifungal coverage. Exploratory surgery 10/27 with distal ileum ischemia s/p resection and ileostomy. Abdominal wall closure 10/29//17   ASSESSMENT / PLAN:  PULMONARY A: Pt intubated for support of pain control /Sepsis  Effusion left? P: Full vent support Assess SBT/Wean today ABG and adjust vent accordingly- reduce MV Titrate O2 for sat of 88-92%. lasix  CARDIOVASCULAR A:  Tachycardia Hypotension- septic shock-Improving , off pressors Lower extremity edema to thigh and scrotum, Component of  volume overload (10L + )  No evidence rel AI P: Hold home anti-HTN Repeat lasix 20 mg IV q6h CVP 6 today - dc Consider dc aline   RENAL A:  AKI, improving HypoK  P: BMET in AM Lasix 20 mg IV q6 K supp kvo  GASTROINTESTINAL A:  ischemic bowel s/p ileostomy perf at anastomosis site  Abscess abdominal Severe protein cal malnutrition  P: Per surgery ok to begin tube feedings today and wean TPN to off if able No plans to return to OR  HEMATOLOGIC A:  Anemia -No evidence of active bleeding dvt prevention P: Cbc in am  SCD Lovenox  INFECTIOUS A:  Sepsis, open abdominal wound, recent hospitalization Elevated Procal 31.88/Lactate 6.7  on admission  Abdominal abscess / peritonitis  -wound cx + e coli  P: Dc vanc Dc imi, narrow Complete 10 days ABX Add stop date antifungals  ENDOCRINE A:  Cortisol 57.6 P: Monitor blood glucose with daily labs No role stress steroids  NEUROLOGIC A:  Pt sedated and  intubated P: RASS goal: -1 to -2  WUA  FAMILY - Updates: No family at bedside this AM  - Inter-disciplinary family meet or Palliative Care meeting due by: 02/14/16  Quin Hoop, MS4 Attending Dr. Titus Mould   Pulmonary and [facility] Pager: [phone]  02/13/2016, 8:09 AM   STAFF NOTE: Linwood Dibbles, MD FACP have personally reviewed patient's available data, including medical history, events of note, physical examination and test results as part of my evaluation. I have discussed with resident/NP and other care providers such as pharmacist, RN and RRT. In addition, I personally evaluated patient and elicited key findings of: on vemt [place]akes FC yes, abdo closed soft, low BS, edema a lot, ABg reviewed, reduce rate, wean aggressive CPAP 5 PS5, goal 30 min , may need abg assessment, sit upright chair position, effusions likely left greater, lasix, dc cvp, neg 1.4 liter again is goal, chem in am ,  k suppp, kvo, dc vanc, no enteroccurs found, narrow off imi add ceftriaxone, ads stop date antifungalsd, tpn continued until at apporx 60% kcal needs via TF, if extubated would swallow test him, WUA mandatory The patient is critically ill with multiple organ systems failure and requires high complexity decision making for assessment and support, frequent evaluation and titration of therapies, application of advanced monitoring technologies and extensive interpretation of multiple databases.   Critical Care Time devoted to patient care services described in this note is 30 Minutes. This time reflects time of care of this signee: Merrie Roof, MD FACP. This critical care time does not reflect procedure time, or teaching time or supervisory time of PA/NP/Med student/Med Resident etc but could involve care discussion time. Rest per NP/medical resident whose note is outlined above and that I agree with   Lavon Paganini. Titus Mould, MD, [place] Pgr: [facility] 02/13/2016 9:02 AM

## 2016-02-13 NOTE — Procedures (Signed)
Extubation Procedure Note  Patient Details:   Name: [name] DOB: [birth year] MRN: [id number]   Airway Documentation:     Evaluation  O2 sats: stable throughout Complications: No apparent complications Patient did tolerate procedure well. Bilateral Breath Sounds: Rhonchi     Pt extubated at this time per MD order, pt had + cuff leak, placed on 4L [place] tolerating well, no distress noted at this time.  Yes able to voice.  [name] 02/13/2016, 9:45 AM

## 2016-02-14 DIAGNOSIS — A4151 Sepsis due to Escherichia coli [E. coli]: Secondary | ICD-10-CM

## 2016-02-14 LAB — GLUCOSE, CAPILLARY
GLUCOSE-CAPILLARY: 113 mg/dL — AB (ref 65–99)
GLUCOSE-CAPILLARY: 129 mg/dL — AB (ref 65–99)
GLUCOSE-CAPILLARY: 84 mg/dL (ref 65–99)
GLUCOSE-CAPILLARY: 85 mg/dL (ref 65–99)
Glucose-Capillary: 130 mg/dL — ABNORMAL HIGH (ref 65–99)
Glucose-Capillary: 129 mg/dL — ABNORMAL HIGH (ref 65–99)

## 2016-02-14 LAB — CBC
HEMATOCRIT: 22.5 % — AB (ref 39.0–52.0)
Hemoglobin: 7.7 g/dL — ABNORMAL LOW (ref 13.0–17.0)
MCH: 27.2 pg (ref 26.0–34.0)
MCHC: 34.2 g/dL (ref 30.0–36.0)
MCV: 79.5 fL (ref 78.0–100.0)
PLATELETS: 295 10*3/uL (ref 150–400)
RBC: 2.83 MIL/uL — ABNORMAL LOW (ref 4.22–5.81)
RDW: 14.6 % (ref 11.5–15.5)
WBC: 18.1 10*3/uL — AB (ref 4.0–10.5)

## 2016-02-14 LAB — CBC WITH DIFFERENTIAL/PLATELET
BASOS ABS: 0 10*3/uL (ref 0.0–0.1)
Basophils Relative: 0 %
Eosinophils Absolute: 0 10*3/uL (ref 0.0–0.7)
Eosinophils Relative: 0 %
HCT: 22.7 % — ABNORMAL LOW (ref 39.0–52.0)
HEMOGLOBIN: 7.6 g/dL — AB (ref 13.0–17.0)
LYMPHS ABS: 2 10*3/uL (ref 0.7–4.0)
LYMPHS PCT: 12 %
MCH: 26.9 pg (ref 26.0–34.0)
MCHC: 33.5 g/dL (ref 30.0–36.0)
MCV: 80.2 fL (ref 78.0–100.0)
MONOS PCT: 9 %
Monocytes Absolute: 1.5 10*3/uL — ABNORMAL HIGH (ref 0.1–1.0)
NEUTROS PCT: 79 %
Neutro Abs: 13 10*3/uL — ABNORMAL HIGH (ref 1.7–7.7)
Platelets: 357 10*3/uL (ref 150–400)
RBC: 2.83 MIL/uL — AB (ref 4.22–5.81)
RDW: 15 % (ref 11.5–15.5)
WBC: 16.5 10*3/uL — AB (ref 4.0–10.5)

## 2016-02-14 LAB — BASIC METABOLIC PANEL
ANION GAP: 6 (ref 5–15)
ANION GAP: 6 (ref 5–15)
BUN: 8 mg/dL (ref 6–20)
BUN: 9 mg/dL (ref 6–20)
CALCIUM: 7.3 mg/dL — AB (ref 8.9–10.3)
CHLORIDE: 98 mmol/L — AB (ref 101–111)
CO2: 32 mmol/L (ref 22–32)
CO2: 31 mmol/L (ref 22–32)
CREATININE: 0.44 mg/dL — AB (ref 0.61–1.24)
Calcium: 7.7 mg/dL — ABNORMAL LOW (ref 8.9–10.3)
Chloride: 96 mmol/L — ABNORMAL LOW (ref 101–111)
Creatinine, Ser: 0.45 mg/dL — ABNORMAL LOW (ref 0.61–1.24)
GFR calc Af Amer: >60 mL/min (ref >60)
GFR calc non Af Amer: >60 mL/min (ref >60)
GLUCOSE: 112 mg/dL — AB (ref 65–99)
Glucose, Bld: 129 mg/dL — ABNORMAL HIGH (ref 65–99)
POTASSIUM: 3.5 mmol/L (ref 3.5–5.1)
Potassium: 2.6 mmol/L — CL (ref 3.5–5.1)
SODIUM: 134 mmol/L — AB (ref 135–145)
Sodium: 135 mmol/L (ref 135–145)

## 2016-02-14 LAB — PHOSPHORUS: PHOSPHORUS: 3.2 mg/dL (ref 2.5–4.6)

## 2016-02-14 LAB — MAGNESIUM: MAGNESIUM: 2 mg/dL (ref 1.7–2.4)

## 2016-02-14 MED ORDER — POTASSIUM CHLORIDE 10 MEQ/50ML IV SOLN
10.0000 meq | INTRAVENOUS | Status: AC
  Administered 2016-02-14 (×3): 10 meq via INTRAVENOUS
  Filled 2016-02-14 (×3): qty 50

## 2016-02-14 MED ORDER — TIGECYCLINE 50 MG IV SOLR
100.0000 mg | Freq: Once | INTRAVENOUS | Status: AC
  Administered 2016-02-14: 100 mg via INTRAVENOUS
  Filled 2016-02-14: qty 100

## 2016-02-14 MED ORDER — FUROSEMIDE 10 MG/ML IJ SOLN
20.0000 mg | Freq: Two times a day (BID) | INTRAMUSCULAR | Status: DC
  Administered 2016-02-14 – 2016-02-15 (×2): 20 mg via INTRAVENOUS
  Filled 2016-02-14: qty 2

## 2016-02-14 MED ORDER — FAMOTIDINE IN NACL 20-0.9 MG/50ML-% IV SOLN
20.0000 mg | Freq: Two times a day (BID) | INTRAVENOUS | Status: DC
  Filled 2016-02-14 (×2): qty 50

## 2016-02-14 MED ORDER — POTASSIUM CHLORIDE 10 MEQ/50ML IV SOLN
10.0000 meq | INTRAVENOUS | Status: AC
  Administered 2016-02-14 (×10): 10 meq via INTRAVENOUS
  Filled 2016-02-14 (×11): qty 50

## 2016-02-14 MED ORDER — TOBRAMYCIN SULFATE 80 MG/2ML IJ SOLN
7.0000 mg/kg | INTRAVENOUS | Status: DC
  Administered 2016-02-14 – 2016-02-17 (×4): 510 mg via INTRAVENOUS
  Filled 2016-02-14 (×6): qty 12.75

## 2016-02-14 MED ORDER — FAMOTIDINE IN NACL 20-0.9 MG/50ML-% IV SOLN
20.0000 mg | Freq: Two times a day (BID) | INTRAVENOUS | Status: DC
  Administered 2016-02-14 – 2016-02-15 (×2): 20 mg via INTRAVENOUS
  Filled 2016-02-14 (×3): qty 50

## 2016-02-14 MED ORDER — TIGECYCLINE 50 MG IV SOLR
50.0000 mg | Freq: Two times a day (BID) | INTRAVENOUS | Status: AC
  Administered 2016-02-15 – 2016-02-20 (×12): 50 mg via INTRAVENOUS
  Filled 2016-02-14 (×13): qty 100

## 2016-02-14 NOTE — Care Management (Signed)
CM faxed  clinicals to Cecille Rubin with workers comp per request

## 2016-02-14 NOTE — Progress Notes (Signed)
PHARMACY - ADULT TOTAL PARENTERAL NUTRITION CONSULT NOTE   Pharmacy Consult: TPN Indication: Ischemic bowel and severe malnutrition  Patient Measurements: Height: 5' 9"  (175.3 cm) Weight: 160 lb 11.5 oz (72.9 kg) IBW/kg (Calculated) : 70.7 TPN AdjBW (KG): 71.7 Body mass index is 23.73 kg/m.  Assessment:  19 YOM with history of small bowel perforation on 01/27/16 s/p resection and subsequent wound dehiscence and was discharged on 02/02/16. Patient was readmitted on 02/08/16 having developed septic shock with ischemic bowel/abscess and was taken to OR 02/08/16 for ex-lap with wound vac placement and removal of ileocecum with open abdomen. Patient noted to have severe malnutrition, muscle depletion, and severe edema as he had not eaten any food and had only been drinking water since previous discharge.  Pharmacy consulted for TPN.  GI: Pepcid in TPN.  Baseline prealbumin <5, NG O/P 19m, ostomy O/P 30034m LBM 10/29.   *10/27 - re-exploration, ileostomy, but no closure *10/29 - abd closure Endo: no hx DM - CBGs well controlled Insulin requirements in the past 24 hours: 1 unit sensitive SSI Lytes: low Na/CL 134/96, K+ 2.6 (10 runs ordered), others WNL Renal: SCr 0.44 (muscle depletion), UOP 5.1 ml/kg/hr on scheduled Lasix Pulm: extubated 10/30 to 2L [place] Cards: off pressors - VSS, IV Lasix  Hepatobil: LFTs WNL except alk phos at 139.  TG WNL. Neuro: PRN Dilaudid/Versed - GCS 15, CPOT 3-8, RASS 0 ID: Tobra/CTX D#2 + Eraxis D#6, s/p 6d Vanc/Primaxin, for intra-abd abscess and Ecoli/Acinetobacter diffuse peritonitis.  Now afebrile post abx adjustment, WBC up 18.1, 10/30 CXR shows worsening infiltrate/atelectasis Best Practices: Lovenox, SCDs, Pepcid in TPN, oral care TPN Access: PICC placed 02/10/16, CVC placed 02/08/16 TPN start date: 02/10/16  Nutritional Goals: 1950-2200 kCal and 100-115 gm protein per day  Current Nutrition:  TPN   Plan:  - D/C TPN per Dr. FeTitus Mould Reduce  Clinimix E 5/15 to 40 ml/hr and D/C at 1800 - D/C SSI/CBG checks - Pepcid 2068mO BID - D/C TPN labs and nursing care orders   Jesseca Marsch D. DanMina MarbleharmD, BCPS Pager:  [phone]/31/2017, 12:14 PM

## 2016-02-14 NOTE — Progress Notes (Signed)
2 Days Post-Op  Subjective: Patient is now extubated, awake, alert Complaining of incisional pain Large amount of ileostomy output  Objective: Vital signs in last 24 hours: Temp:  [98 F (36.7 C)-98.7 F (37.1 C)] 98.6 F (37 C) (10/31 0746) Pulse Rate:  [74-119] 75 (10/31 0700) Resp:  [14-34] 20 (10/31 0700) BP: (87-154)/(46-98) 124/46 (10/31 0700) SpO2:  [95 %-100 %] 100 % (10/31 0700) Arterial Line BP: (121-143)/(57-70) 136/66 (10/30 1500) FiO2 (%):  [40 %] 40 % (10/30 0900) Weight:  [72.9 kg (160 lb 11.5 oz)] 72.9 kg (160 lb 11.5 oz) (10/31 0400) Last BM Date: 02/14/16  Intake/Output from previous day: 10/30 0701 - 10/31 0700 In: 3053.3 [I.V.:2080; IV Piggyback:843.3] Out: 12000 [Urine:8850; Emesis/NG output:150; [id number] Intake/Output this shift: No intake/output data recorded.  General appearance: alert, cooperative and no distress Resp: clear to auscultation bilaterally Cardio: regular rate and rhythm, S1, S2 normal, no murmur, click, rub or gallop GI: soft, + BS; midline tenderness; Pink mucosa of ileostomy - thin bilious output  Lab Results:   Recent Labs  02/13/16 0601 02/14/16 0345  WBC 13.7* 18.1*  HGB 10.0* 7.7*  HCT 29.6* 22.5*  PLT 248 295   BMET  Recent Labs  02/13/16 0601 02/14/16 0345  NA 131* 134*  K 3.2* 2.6*  CL 97* 96*  CO2 28 32  GLUCOSE 122* 129*  BUN 5* 8  CREATININE 0.41* 0.44*  CALCIUM 6.8* 7.3*   PT/INR No results for input(s): LABPROT, INR in the last 72 hours. ABG  Recent Labs  02/13/16 0352  PHART 7.505*  HCO3 31.7*    Studies/Results: Dg Chest Port 1 View  Result Date: 02/13/2016 CLINICAL DATA:  Followup endotracheal support. EXAM: PORTABLE CHEST 1 VIEW COMPARISON:  02/12/2016 FINDINGS: Endotracheal tube has its tip 2 cm above the carina. Nasogastric tube enters stomach. Right internal jugular central line has its tip in the SVC above the right atrium. Right arm PICC has its tip in the proximal right atrium.  The upper lobes remain clear. There has been worsened volume loss in the lower lobes since yesterday's velum, more similar to the exam of 2 days ago. IMPRESSION: Slight worsening of infiltrate/atelectasis in both lower lobes since yesterday's film, similar to the exam of 2 days ago. Electronically Signed   By: Nelson Chimes M.D.   On: 02/13/2016 06:57    Anti-infectives: Anti-infectives    Start     Dose/Rate Route Frequency Ordered Stop   02/13/16 1200  cefTRIAXone (ROCEPHIN) 2 g in dextrose 5 % 50 mL IVPB     2 g 100 mL/hr over 30 Minutes Intravenous Every 24 hours 02/13/16 0857 02/17/16 1159   02/13/16 1200  tobramycin (NEBCIN) 530 mg in dextrose 5 % 100 mL IVPB     7 mg/kg  75.5 kg (Adjusted) 113.3 mL/hr over 60 Minutes Intravenous Every 24 hours 02/13/16 1123     02/10/16 1500  anidulafungin (ERAXIS) 100 mg in sodium chloride 0.9 % 100 mL IVPB     100 mg over 90 Minutes Intravenous Every 24 hours 02/09/16 0804 02/17/16 1459   02/10/16 1500  vancomycin (VANCOCIN) IVPB 1000 mg/200 mL premix  Status:  Discontinued     1,000 mg 200 mL/hr over 60 Minutes Intravenous Every 8 hours 02/10/16 1239 02/13/16 0856   02/10/16 1500  imipenem-cilastatin (PRIMAXIN) 1,000 mg in sodium chloride 0.9 % 250 mL IVPB  Status:  Discontinued     1,000 mg 250 mL/hr over 60 Minutes Intravenous Every 8 hours  02/10/16 1239 02/13/16 0856   02/09/16 1000  imipenem-cilastatin (PRIMAXIN) 1,000 mg in sodium chloride 0.9 % 250 mL IVPB  Status:  Discontinued     1,000 mg 250 mL/hr over 60 Minutes Intravenous Every 8 hours 02/09/16 0812 02/10/16 1239   02/09/16 1000  vancomycin (VANCOCIN) IVPB 1000 mg/200 mL premix  Status:  Discontinued     1,000 mg 200 mL/hr over 60 Minutes Intravenous Every 8 hours 02/09/16 0812 02/10/16 1239   02/09/16 0900  anidulafungin (ERAXIS) 200 mg in sodium chloride 0.9 % 200 mL IVPB     200 mg over 180 Minutes Intravenous  Once 02/09/16 0804 02/09/16 1209   02/09/16 0830  anidulafungin  (ERAXIS) 100 mg in sodium chloride 0.9 % 100 mL IVPB  Status:  Discontinued     100 mg over 90 Minutes Intravenous Every 24 hours 02/08/16 0811 02/09/16 0804   02/08/16 1800  vancomycin (VANCOCIN) IVPB 750 mg/150 ml premix  Status:  Discontinued     750 mg 150 mL/hr over 60 Minutes Intravenous Every 8 hours 02/08/16 0847 02/09/16 0812   02/08/16 1400  piperacillin-tazobactam (ZOSYN) IVPB 3.375 g  Status:  Discontinued     3.375 g 12.5 mL/hr over 240 Minutes Intravenous Every 8 hours 02/08/16 0432 02/08/16 0802   02/08/16 0900  imipenem-cilastatin (PRIMAXIN) 500 mg in sodium chloride 0.9 % 100 mL IVPB  Status:  Discontinued     500 mg 200 mL/hr over 30 Minutes Intravenous Every 6 hours 02/08/16 0818 02/09/16 0812   02/08/16 0830  ertapenem (INVANZ) 1 g in sodium chloride 0.9 % 50 mL IVPB  Status:  Discontinued     1 g 100 mL/hr over 30 Minutes Intravenous Every 24 hours 02/08/16 0756 02/08/16 0812   02/08/16 0830  anidulafungin (ERAXIS) 200 mg in sodium chloride 0.9 % 200 mL IVPB  Status:  Discontinued     200 mg over 180 Minutes Intravenous  Once 02/08/16 0811 02/09/16 1457   02/08/16 0830  vancomycin (VANCOCIN) 1,500 mg in sodium chloride 0.9 % 500 mL IVPB  Status:  Discontinued     1,500 mg 250 mL/hr over 120 Minutes Intravenous  Once 02/08/16 0815 02/09/16 1355   02/08/16 0815  vancomycin (VANCOCIN) 1,500 mg in sodium chloride 0.9 % 500 mL IVPB  Status:  Discontinued     1,500 mg 250 mL/hr over 120 Minutes Intravenous Every 12 hours 02/08/16 0811 02/08/16 0812   02/08/16 0430  piperacillin-tazobactam (ZOSYN) IVPB 3.375 g     3.375 g 100 mL/hr over 30 Minutes Intravenous  Once 02/08/16 0419 02/08/16 0532      Assessment/Plan: Crohns disease s/p ileocecectomy 01/27/16 for SB perforation Anastomotic leak/ peritoneal abscess - s/p exp lap/ resection of anastomosis 02/08/16 Washout/ VAC change 02/10/16 Washout/ fascial closure 02/12/16 Acute blood loss anemia - monitor WBC - doubt  intra-abdominal abscess this soon after abdominal closure; will follow  Improving Ostomy pink  Swallow eval - may begin PO's/ wean TNA No plans for return to OR Wet to dry dressings to midline wound Will need to keep up with fluid loss from ileostomy output - may need to slow down with Imodium  LOS: 6 days    Roger Meland K. 02/14/2016

## 2016-02-14 NOTE — Progress Notes (Signed)
PULMONARY / CRITICAL CARE MEDICINE   Name: [name] MRN: [id number] DOB: [birth year]    ADMISSION DATE:  02/08/2016 CONSULTATION DATE:  02/08/16  REFERRING MD:  ED  CHIEF COMPLAINT:  Abdominal pain  Brief History: [name] is a 50 year old white male with a past medical history of small bowel perforation on 01/27/16 s/p resection with subsequent wound dehisence. He was d/c on 02/02/16. Developed septic shock, ischemic bowel, abscess. Pt was taken to the OR on 02/08/16 for exploratory surgery. Drained abscess, removed ileocecum.  SUBJECTIVE:  SLp passed No distress  VITAL SIGNS: BP (!) 124/46   Pulse 75   Temp 98.6 F (37 C) (Oral)   Resp 20   Ht 5\' 9"  (1.753 m)   Wt 72.9 kg (160 lb 11.5 oz)   SpO2 100%   BMI 23.73 kg/m   HEMODYNAMICS:    VENTILATOR SETTINGS: Vent Mode: PSV;CPAP FiO2 (%):  [40 %] 40 % Set Rate:  [12 bmp] 12 bmp PEEP:  [5 cmH20] 5 cmH20 Pressure Support:  [5 cmH20] 5 cmH20  INTAKE / OUTPUT: I/O last 3 completed shifts: In: 5714.3 [I.V.:3541; Other:130; IV Piggyback:2043.3] Out: [id number] [Urine:10900; Emesis/NG output:150; Stool:3000]  PHYSICAL EXAMINATION: General: NAD Neuro:  Awake, alert, CN II-VII grossly intact HEENT:  [place]/AT, PEERL, trachea midline, dry oral mucosa Cardiovascular:  RRR, no mrg, mild edema Lungs:  CTAB Abdomen:  Clean wound in center of abdomen, peritoneum closed, no erythema, ileostomy present, no bowel sounds present Musculoskeletal:  Moves all 4 extremities Skin:  Warm, dry, intact  LABS:  BMET  Recent Labs Lab 02/12/16 1734 02/13/16 0601 02/14/16 0345  NA 132* 131* 134*  K 3.0* 3.2* 2.6*  CL 98* 97* 96*  CO2 28 28 32  BUN <5* 5* 8  CREATININE 0.41* 0.41* 0.44*  GLUCOSE 114* 122* 129*    Electrolytes  Recent Labs Lab 02/12/16 0555 02/12/16 1734 02/13/16 0601 02/14/16 0345  CALCIUM 6.9* 7.0* 6.8* 7.3*  MG 1.7  --  1.7 2.0  PHOS 2.7  --  3.2 3.2    CBC  Recent Labs Lab 02/12/16 0555  02/13/16 0601 02/14/16 0345  WBC 13.2* 13.7* 18.1*  HGB 8.1* 10.0* 7.7*  HCT 24.0* 29.6* 22.5*  PLT 265 248 295    Coag's  Recent Labs Lab 02/08/16 0928 02/09/16 0830 02/10/16 0800  APTT 33  --   --   INR 1.71 1.67 1.44    Sepsis Markers  Recent Labs Lab 02/08/16 0928 02/08/16 1315 02/09/16 0515 02/12/16 0555  LATICACIDVEN  --  4.3* 2.4* 1.0  PROCALCITON 31.88  --   --  3.37    ABG  Recent Labs Lab 02/10/16 0257 02/11/16 0321 02/13/16 0352  PHART 7.397 7.432 7.505*  PCO2ART 34.1 38.4 40.5  PO2ART 64.0* 94.6 124*    Liver Enzymes  Recent Labs Lab 02/09/16 0515 02/10/16 0402 02/13/16 0601  AST 26 23 24   ALT 18 17 17   ALKPHOS 97 114 139*  BILITOT 0.5 0.3 0.5  ALBUMIN 1.2* 1.1* 1.1*    Cardiac Enzymes  Recent Labs Lab 02/08/16 0928  TROPONINI 0.03*    Glucose  Recent Labs Lab 02/13/16 1211 02/13/16 1535 02/13/16 1943 02/13/16 2350 02/14/16 0339 02/14/16 0740  GLUCAP 117* 121* 143* 129* 130* 129*    Imaging No results found.   STUDIES: CT abd/pelvis 10/25- >>ascites with suspected peritonitis. Irregular 16 x 12 cm early pelvic abscess. Edematous dilated small bowel favoring ileus. Wedge-like hypodensity in dome of the liver  with additional hepatic hypodensities  XR chest/abdomen 10/25 >>Small LEFT pleural effusion and atelectasis. Scattered air-fluid levels with paucity of large bowel gas concerning for early or partial small bowel obstruction  CXR 10/30 >> Slight worsening of infiltrate/atelectasis in both lower lobes  CULTURES: Blood cultures 10/25 >> pending, no growth at 12 hrs Wound clx 10/25 >>e coli (sens to imipen)  UCX 10/25 >neg   ANTIBIOTICS: 10/25 vanc>>>10/30 10/25 imipenem>>>10/30 10/30 ceftriaxone >>>plan stop 11/3 10/25 anidulafungin 7>>>plan stop 11/2 10/30 tobramycin  SIGNIFICANT EVENTS: Code sepsis called in ED Intubated and central access obtained 02/08/16 Exploratory surgery, wound vac  placement and ileocectomy 02/08/16 Exploratory surgery-peritonitis , distal ileum ischemia , s/p resection w/ ileostomy placed 02/10/16 Exploratory laparotomy with removal of vacuum pack dressing and closure of abdominal wall 02/12/16 Extubated 02/13/16  LINES/TUBES: ETT 02/08/16>>> removed 10/30 Right IJ 02/08/16>>> PICC 02/10/16 >>  DISCUSSION: This is a 50 year old male with suspected ischemic bowel, taken to OR 02/08/16 emergently for exploration. He is septic, on broad spectrum abx and antifungal coverage. Exploratory surgery 10/27 with distal ileum ischemia s/p resection and ileostomy. Abdominal wall closure 02/12/16   ASSESSMENT / PLAN:  PULMONARY A: Extubated Effusion left? P: CXR in AM for effusion residual? IS  CARDIOVASCULAR A:  Tachycardia- improving Hypotension- septic shock-Improving , off pressors Lower extremity edema resolving No evidence rel AI Hyopkalemia P: Replace K Hold home anti-HTN Lower lasix dose  RENAL A:  AKI, improving HypoK  P: BMET in AM K supp kvo Lasix maintain 20 bid Chem in PM needed  GASTROINTESTINAL A:  ischemic bowel s/p ileostomy perf at anastomosis site  Abscess abdominal Severe protein cal malnutrition  P: SLp Add diet  HEMATOLOGIC A:  Anemia -No evidence of active bleeding, hgb 7.7 today dvt prevention Leukocytosis P: Repeat CBC today No bleeding SCD Lovenox  INFECTIOUS A:  Sepsis, open abdominal wound, recent hospitalization Elevated Procal 31.88/Lactate 6.7 on admission  Abdominal abscess / peritonitis -wound cx + e coli  Acinetobacter from peritoneal fluid susceptible to tobramycin only.  P: On eraxis, rocephin and tobramycin, per pharmacy e.coli also covered by tobra, ok to stop rocephin Complete 10 days ABX  ENDOCRINE A:  Cortisol 57.6 P: Monitor blood glucose with daily labs No role stress steroids  NEUROLOGIC A:  Awake and alert P: Pt  consult FAMILY - Updates: No family at bedside this AM. Wife to come today.  - Inter-disciplinary family meet or Palliative Care meeting due by: 02/14/16  Quin Hoop, MS4 Attending Dr. Titus Mould    Pulmonary and [place] Pager: [phone]  02/14/2016, 8:01 AM   STAFF NOTE: I, Merrie Roof, MD FACP have personally reviewed patient's available data, including medical history, events of note, physical examination and test results as part of my evaluation. I have discussed with resident/NP and other care providers such as pharmacist, RN and RRT. In addition, I personally evaluated patient and elicited key findings of: alert, no distress, less coarse BS, abdo soft, diet started,. Dc tPN and follow intake, did respond to lasix, repeat pcxr, lower lasix, K supp, he received totoal 8 days polymicrobial coverage, can dc ceftriaxone, continued tobramycin per ID, will call ID consult, dc left IJ, dc foley done, to triad, sdu  Lavon Paganini. Titus Mould, MD, [place] Pgr: [place] 02/14/2016 12:10 PM

## 2016-02-14 NOTE — Progress Notes (Signed)
[facility] ADULT ICU REPLACEMENT PROTOCOL FOR AM LAB REPLACEMENT ONLY  The patient does apply for the [facility] Adult ICU Electrolyte Replacment Protocol based on the criteria listed below:   1. Is GFR >/= 40 ml/min? Yes.    [name] GFR today is >60 2. Is urine output >/= 0.5 ml/kg/hr for the last 6 hours? Yes.   [name] UOP is 2.3 ml/kg/hr 3. Is BUN < 60 mg/dL? Yes.    [name] BUN today is 8 4. Abnormal electrolyte(s): K+2.6 5. Ordered repletion with: Protocol 6. If a panic level lab has been reported, has the CCM MD in charge been notified? Yes.  .   Physician:  Corrie Dandy  [facility] 02/14/2016 4:37 AM

## 2016-02-14 NOTE — Progress Notes (Signed)
CRITICAL VALUE ALERT  Critical value received:  K+ 2.6  Date of notification:  02/14/16  Time of notification:  0417  Critical value read back:Yes.    Nurse who received alert:  Vic Blackbird RN  MD notified (1st page):  eLink MD  Time of first page:  [phone]  Responding MD:  Warren Lacy MD  Time MD responded:  [phone]

## 2016-02-14 NOTE — Progress Notes (Signed)
Pt abg cancelled due to pt no longer on a vent.  RT will monitor for future necessary ABG

## 2016-02-14 NOTE — Progress Notes (Signed)
Wasted 45 mls of IV Versed drip and 150 mls of fentanyl drip in sink witnessed by [employer]

## 2016-02-14 NOTE — Progress Notes (Signed)
ID called for recommendations on antibiotics. Will see patient as an official consult.   Smiley Houseman, MD PGY 2 Family Medicine

## 2016-02-14 NOTE — Consult Note (Addendum)
[facility]  Total days of antibiotics 8        Day 6 anidulafungin        Day 2 ceftriaxone        Day 2 tobramycin       Reason for Consult: intra-abdominal infection with MDRO    Referring Physician: feinstein  Active Problems:   Abdominal infection ([place])   Peritonitis ([place])   Sepsis ([place])    HPI: [name] is a 50 y.o. male with past medical history of chronic back pain, hx of back surgeries, malnutrition, who was admitted for progressive worsening of abdominal pain on 10/13 for perforated viscus s/p ex-lap and ileocecectomy on 10/13, he was discharged on 10/19 then readmitted on 10/25 for worsening abdominal pain over 12 hrs. He was found to have wound dehiscence, peritonitis, and free air on imaging concerning for anastomosis leak. He was admitted to icu due to sepsis from intra-abdominal infection. He went to the or on 10/25 for ex lap where they found 2L of intra-abd abscess with leak to ileocolonic anastomosis. His cultures revealed ecoli, acinetobacter -MDRO. He was initially started on anidulafungin, imipenem nad vancomycin. He went back to or on 10/27 for where distal ileum appeared ischemic thus had 1.5 feet resected and ileostomy creation. He went to the OR on 10/29 for closure of abdominal wall. His last fever was on 10/29 up to 101F, today remains afebrile. His leukocytosis trended up from 13 to 18. ID asked to provide abtx recs.   Past Medical History:  Diagnosis Date  . Asthma   . Complication of anesthesia    " I got confused "  . Pneumonia   . Small bowel perforation ([place]) 01/2016    Allergies:  Allergies  Allergen Reactions  . Celecoxib     Other reaction(s): IRRITABILITY  . Clonazepam Itching  . Cyclobenzaprine     Other reaction(s): IRRITABILITY  . Dexamethasone     Other reaction(s): IRRITABILITY  . Diazepam     Other reaction(s): IRRITABILITY  . Etodolac     Other reaction(s): IRRITABILITY  . Gabapentin     Other  reaction(s): IRRITABILITY  . Metaxalone     Other reaction(s): IRRITABILITY  . Methocarbamol     Other reaction(s): IRRITABILITY  . Pregabalin     Other reaction(s): IRRITABILITY  . Propoxyphene     Other reaction(s): IRRITABILITY  . Tramadol     Other reaction(s): IRRITABILITY     MEDICATIONS: . anidulafungin  100 mg Intravenous Q24H  . chlorhexidine  15 mL Mouth Rinse BID  . enoxaparin (LOVENOX) injection  40 mg Subcutaneous Q24H  . famotidine (PEPCID) IV  20 mg Intravenous Q12H  . furosemide  20 mg Intravenous BID  . mouth rinse  15 mL Mouth Rinse q12n4p  . potassium chloride  10 mEq Intravenous Q1H  . sodium chloride flush  10-40 mL Intracatheter Q12H  . tigecycline (TYGACIL) IVPB  100 mg Intravenous Once   Followed by  . [START ON 02/15/2016] tigecycline (TYGACIL) IVPB  50 mg Intravenous Q12H  . tobramycin  7 mg/kg Intravenous Q24H    Social History  Substance Use Topics  . Smoking status: Current Every Day Smoker    Packs/day: 0.50    Years: 15.00    Types: Cigarettes  . Smokeless tobacco: Never Used  . Alcohol use No    History reviewed. No pertinent family history.   Review of Systems  Constitutional: positive for fever, chills, decrease  activity change, decrease appetite change, fatigue and decrease unexpected weight change.  HENT: Negative for congestion, sore throat, rhinorrhea, sneezing, trouble swallowing and sinus pressure.  Eyes: Negative for photophobia and visual disturbance.  Respiratory: Negative for cough, chest tightness, shortness of breath, wheezing and stridor.  Cardiovascular: Negative for chest pain, palpitations and leg swelling.  Gastrointestinal: + abdominal pain Negative for nausea, vomiting, abdominal pain, diarrhea, constipation, blood in stool, abdominal distention and anal bleeding.  Genitourinary: Negative for dysuria, hematuria, flank pain and difficulty urinating.  Musculoskeletal: Negative for myalgias, back pain, joint swelling,  arthralgias and gait problem.  Skin: Negative for color change, pallor, rash and wound.  Neurological: Negative for dizziness, tremors, weakness and light-headedness.  Hematological: Negative for adenopathy. Does not bruise/bleed easily.  Psychiatric/Behavioral: Negative for behavioral problems, confusion, sleep disturbance, dysphoric mood, decreased concentration and agitation.     OBJECTIVE: Temp:  [98 F (36.7 C)-98.7 F (37.1 C)] 98.6 F (37 C) (10/31 1130) Pulse Rate:  [71-112] 73 (10/31 1200) Resp:  [14-34] 31 (10/31 1200) BP: (115-154)/(46-98) 134/70 (10/31 1200) SpO2:  [95 %-100 %] 100 % (10/31 1200) Arterial Line BP: (134-143)/(64-70) 136/66 (10/30 1500) Weight:  [160 lb 11.5 oz (72.9 kg)] 160 lb 11.5 oz (72.9 kg) (10/31 0400) Physical Exam  Constitutional: He is oriented to person, place, and time. He in no distress though appears cachetic, malnourished, chronically ill HENT: bitemporal wasting,  Mouth/Throat: Oropharynx is clear and moist. No oropharyngeal exudate.  Cardiovascular: Normal rate, regular rhythm and normal heart sounds. Exam reveals no gallop and no friction rub.  No murmur heard.  Neck: right IJ in place Pulmonary/Chest: Effort normal and breath sounds normal. No respiratory distress. He has no wheezes.  Abdominal: Soft. 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 Midline abdomen is bandaged Lymphadenopathy:  He has no cervical adenopathy.  Ext: right arm picc line Neurological: He is alert and oriented to person, place, and time.  Skin: Skin is warm and dry. No rash noted. No erythema. Tattoos to right arm Psychiatric: He has a normal mood and affect. His behavior is normal.    LABS: Results for orders placed or performed during the hospital encounter of 02/08/16 (from the past 48 hour(s))  Glucose, capillary     Status: Abnormal   Collection Time: 02/12/16  4:24 PM  Result Value Ref Range   Glucose-Capillary 110 (H) 65 - 99 mg/dL   Comment 1 Notify RN     Comment 2 Document in Chart   Glucose, capillary     Status: Abnormal   Collection Time: 02/12/16  5:30 PM  Result Value Ref Range   Glucose-Capillary 130 (H) 65 - 99 mg/dL  Basic metabolic panel     Status: Abnormal   Collection Time: 02/12/16  5:34 PM  Result Value Ref Range   Sodium 132 (L) 135 - 145 mmol/L   Potassium 3.0 (L) 3.5 - 5.1 mmol/L   Chloride 98 (L) 101 - 111 mmol/L   CO2 28 22 - 32 mmol/L   Glucose, Bld 114 (H) 65 - 99 mg/dL   BUN <5 (L) 6 - 20 mg/dL   Creatinine, Ser 0.41 (L) 0.61 - 1.24 mg/dL   Calcium 7.0 (L) 8.9 - 10.3 mg/dL   GFR calc non Af Amer >60 >60 mL/min   GFR calc Af Amer >60 >60 mL/min    Comment: (NOTE) The eGFR has been calculated using the CKD EPI equation. This calculation has not been validated in all clinical situations. eGFR's persistently <60 mL/min signify  possible Chronic Kidney Disease.    Anion gap 6 5 - 15  Glucose, capillary     Status: None   Collection Time: 02/12/16  8:14 PM  Result Value Ref Range   Glucose-Capillary 99 65 - 99 mg/dL   Comment 1 Notify RN   Glucose, capillary     Status: None   Collection Time: 02/12/16 11:29 PM  Result Value Ref Range   Glucose-Capillary 98 65 - 99 mg/dL   Comment 1 Notify RN   Blood gas, arterial     Status: Abnormal   Collection Time: 02/13/16  3:52 AM  Result Value Ref Range   FIO2 0.40    Delivery systems VENTILATOR    Mode PRESSURE REGULATED VOLUME CONTROL    VT 570 mL   LHR 14 resp/min   Peep/cpap 5.0 cm H20   pH, Arterial 7.505 (H) 7.350 - 7.450   pCO2 arterial 40.5 32.0 - 48.0 mmHg   pO2, Arterial 124 (H) 83.0 - 108.0 mmHg   Bicarbonate 31.7 (H) 20.0 - 28.0 mmol/L   Acid-Base Excess 8.2 (H) 0.0 - 2.0 mmol/L   O2 Saturation 98.3 %   Patient temperature 98.6    Collection site A-LINE    Drawn by [id number]    Sample type ARTERIAL DRAW   Glucose, capillary     Status: None   Collection Time: 02/13/16  5:59 AM  Result Value Ref Range   Glucose-Capillary 86 65 - 99 mg/dL    Comment 1 Notify RN    Comment 2 Document in Chart   Comprehensive metabolic panel     Status: Abnormal   Collection Time: 02/13/16  6:01 AM  Result Value Ref Range   Sodium 131 (L) 135 - 145 mmol/L   Potassium 3.2 (L) 3.5 - 5.1 mmol/L   Chloride 97 (L) 101 - 111 mmol/L   CO2 28 22 - 32 mmol/L   Glucose, Bld 122 (H) 65 - 99 mg/dL   BUN 5 (L) 6 - 20 mg/dL   Creatinine, Ser 0.41 (L) 0.61 - 1.24 mg/dL   Calcium 6.8 (L) 8.9 - 10.3 mg/dL   Total Protein 3.8 (L) 6.5 - 8.1 g/dL   Albumin 1.1 (L) 3.5 - 5.0 g/dL   AST 24 15 - 41 U/L   ALT 17 17 - 63 U/L   Alkaline Phosphatase 139 (H) 38 - 126 U/L   Total Bilirubin 0.5 0.3 - 1.2 mg/dL   GFR calc non Af Amer >60 >60 mL/min   GFR calc Af Amer >60 >60 mL/min    Comment: (NOTE) The eGFR has been calculated using the CKD EPI equation. This calculation has not been validated in all clinical situations. eGFR's persistently <60 mL/min signify possible Chronic Kidney Disease.    Anion gap 6 5 - 15  Magnesium     Status: None   Collection Time: 02/13/16  6:01 AM  Result Value Ref Range   Magnesium 1.7 1.7 - 2.4 mg/dL  Phosphorus     Status: None   Collection Time: 02/13/16  6:01 AM  Result Value Ref Range   Phosphorus 3.2 2.5 - 4.6 mg/dL  CBC     Status: Abnormal   Collection Time: 02/13/16  6:01 AM  Result Value Ref Range   WBC 13.7 (H) 4.0 - 10.5 K/uL   RBC 3.66 (L) 4.22 - 5.81 MIL/uL   Hemoglobin 10.0 (L) 13.0 - 17.0 g/dL   HCT 29.6 (L) 39.0 - 52.0 %   MCV 80.9 78.0 -  100.0 fL   MCH 27.3 26.0 - 34.0 pg   MCHC 33.8 30.0 - 36.0 g/dL   RDW 15.1 11.5 - 15.5 %   Platelets 248 150 - 400 K/uL  Differential     Status: Abnormal   Collection Time: 02/13/16  6:01 AM  Result Value Ref Range   Neutrophils Relative % 73 %   Lymphocytes Relative 10 %   Monocytes Relative 16 %   Eosinophils Relative 1 %   Basophils Relative 0 %   Neutro Abs 10.0 (H) 1.7 - 7.7 K/uL   Lymphs Abs 1.4 0.7 - 4.0 K/uL   Monocytes Absolute 2.2 (H) 0.1 - 1.0 K/uL     Eosinophils Absolute 0.1 0.0 - 0.7 K/uL   Basophils Absolute 0.0 0.0 - 0.1 K/uL   RBC Morphology POLYCHROMASIA PRESENT     Comment: BASOPHILIC STIPPLING   WBC Morphology TOXIC GRANULATION     Comment: ATYPICAL LYMPHOCYTES  Prealbumin     Status: Abnormal   Collection Time: 02/13/16  6:01 AM  Result Value Ref Range   Prealbumin <5 (L) 18 - 38 mg/dL  Triglycerides     Status: None   Collection Time: 02/13/16  6:01 AM  Result Value Ref Range   Triglycerides 101 <150 mg/dL  Glucose, capillary     Status: Abnormal   Collection Time: 02/13/16  8:11 AM  Result Value Ref Range   Glucose-Capillary 181 (H) 65 - 99 mg/dL  Glucose, capillary     Status: Abnormal   Collection Time: 02/13/16 12:11 PM  Result Value Ref Range   Glucose-Capillary 117 (H) 65 - 99 mg/dL   Comment 1 Notify RN    Comment 2 Document in Chart   Glucose, capillary     Status: Abnormal   Collection Time: 02/13/16  3:35 PM  Result Value Ref Range   Glucose-Capillary 121 (H) 65 - 99 mg/dL   Comment 1 Document in Chart   Glucose, capillary     Status: Abnormal   Collection Time: 02/13/16  7:43 PM  Result Value Ref Range   Glucose-Capillary 143 (H) 65 - 99 mg/dL   Comment 1 Notify RN    Comment 2 Document in Chart   Tobramycin level, random     Status: None   Collection Time: 02/13/16  9:33 PM  Result Value Ref Range   Tobramycin Rm 3.2 ug/mL    Comment:        Random Tobramycin therapeutic range is dependent on dosage and time of specimen collection. A peak range is 5.0-10.0 ug/mL A trough range is 0.5-2.0 ug/mL          Glucose, capillary     Status: Abnormal   Collection Time: 02/13/16 11:50 PM  Result Value Ref Range   Glucose-Capillary 129 (H) 65 - 99 mg/dL   Comment 1 Notify RN    Comment 2 Document in Chart   Glucose, capillary     Status: Abnormal   Collection Time: 02/14/16  3:39 AM  Result Value Ref Range   Glucose-Capillary 130 (H) 65 - 99 mg/dL   Comment 1 Notify RN    Comment 2 Document  in Chart   Basic metabolic panel     Status: Abnormal   Collection Time: 02/14/16  3:45 AM  Result Value Ref Range   Sodium 134 (L) 135 - 145 mmol/L   Potassium 2.6 (LL) 3.5 - 5.1 mmol/L    Comment: DELTA CHECK NOTED CRITICAL RESULT CALLED TO, READ BACK BY AND  VERIFIED WITH: [facility] L,RN 02/14/16 0417 WAYK    Chloride 96 (L) 101 - 111 mmol/L   CO2 32 22 - 32 mmol/L   Glucose, Bld 129 (H) 65 - 99 mg/dL   BUN 8 6 - 20 mg/dL   Creatinine, Ser 0.44 (L) 0.61 - 1.24 mg/dL   Calcium 7.3 (L) 8.9 - 10.3 mg/dL   GFR calc non Af Amer >60 >60 mL/min   GFR calc Af Amer >60 >60 mL/min    Comment: (NOTE) The eGFR has been calculated using the CKD EPI equation. This calculation has not been validated in all clinical situations. eGFR's persistently <60 mL/min signify possible Chronic Kidney Disease.    Anion gap 6 5 - 15  Magnesium     Status: None   Collection Time: 02/14/16  3:45 AM  Result Value Ref Range   Magnesium 2.0 1.7 - 2.4 mg/dL  Phosphorus     Status: None   Collection Time: 02/14/16  3:45 AM  Result Value Ref Range   Phosphorus 3.2 2.5 - 4.6 mg/dL  CBC     Status: Abnormal   Collection Time: 02/14/16  3:45 AM  Result Value Ref Range   WBC 18.1 (H) 4.0 - 10.5 K/uL   RBC 2.83 (L) 4.22 - 5.81 MIL/uL   Hemoglobin 7.7 (L) 13.0 - 17.0 g/dL    Comment: DELTA CHECK NOTED REPEATED TO VERIFY    HCT 22.5 (L) 39.0 - 52.0 %   MCV 79.5 78.0 - 100.0 fL   MCH 27.2 26.0 - 34.0 pg   MCHC 34.2 30.0 - 36.0 g/dL   RDW 14.6 11.5 - 15.5 %   Platelets 295 150 - 400 K/uL  Glucose, capillary     Status: Abnormal   Collection Time: 02/14/16  7:40 AM  Result Value Ref Range   Glucose-Capillary 129 (H) 65 - 99 mg/dL   Comment 1 Document in Chart   Glucose, capillary     Status: Abnormal   Collection Time: 02/14/16 11:28 AM  Result Value Ref Range   Glucose-Capillary 129 (H) 65 - 99 mg/dL    MICRO: 10/25 urine cx ngtd 10/25 abscess cx: MODERATE ESCHERICHIA COLI  FEW ACINETOBACTER  CALCOACETICUS/BAUMANNII COMPLEX  MULTI-DRUG RESISTANT ORGANISM  MODERATE BACTEROIDES CACCAE  BETA LACTAMASE POSITIVE  IMAGING: Dg Chest Port 1 View  Result Date: 02/13/2016 CLINICAL DATA:  Followup endotracheal support. EXAM: PORTABLE CHEST 1 VIEW COMPARISON:  02/12/2016 FINDINGS: Endotracheal tube has its tip 2 cm above the carina. Nasogastric tube enters stomach. Right internal jugular central line has its tip in the SVC above the right atrium. Right arm PICC has its tip in the proximal right atrium. The upper lobes remain clear. There has been worsened volume loss in the lower lobes since yesterday's velum, more similar to the exam of 2 days ago. IMPRESSION: Slight worsening of infiltrate/atelectasis in both lower lobes since yesterday's film, similar to the exam of 2 days ago. Electronically Signed   By: Nelson Chimes M.D.   On: 02/13/2016 06:57   Assessment/Plan:  50yo M with chronic low back pain, hx of back surgery, appears chronically illl admitted mid October for perforated abdomen s/p bowel resection - ileocecectomy c/b anastomosis leak presenting with sepsis for intra-abdominal infection. He has had source control since 10/27. 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

## 2016-02-14 NOTE — Evaluation (Signed)
Clinical/Bedside Swallow Evaluation Patient Details  Name: [name] MRN: [id number] Date of Birth: May 25, 1965  Today's Date: 02/14/2016 Time: SLP Start Time (ACUTE ONLY): 1053 SLP Stop Time (ACUTE ONLY): 1106 SLP Time Calculation (min) (ACUTE ONLY): 13 min  Past Medical History:  Past Medical History:  Diagnosis Date  . Asthma   . Complication of anesthesia    " I got confused "  . Pneumonia   . Small bowel perforation ([place]) 01/2016   Past Surgical History:  Past Surgical History:  Procedure Laterality Date  . APPLICATION OF WOUND VAC N/A 02/08/2016   Procedure: APPLICATION OF WOUND VAC;  Surgeon: Judeth Horn, MD;  Location: [place];  Service: General;  Laterality: N/A;  . BACK SURGERY    . BOWEL RESECTION N/A 01/27/2016   Procedure: SMALL BOWEL RESECTION;  Surgeon: Greer Pickerel, MD;  Location: [place];  Service: General;  Laterality: N/A;  . EXPLORATORY LAPAROTOMY  01/27/2016  . ILEOCECETOMY N/A 02/08/2016   Procedure: Pennie Rushing;  Surgeon: Judeth Horn, MD;  Location: [place];  Service: General;  Laterality: N/A;  . ILEOSTOMY N/A 02/10/2016   Procedure: ILEOSTOMY;  Surgeon: Judeth Horn, MD;  Location: [facility];  Service: General;  Laterality: N/A;  . LAPAROTOMY N/A 01/27/2016   Procedure: EXPLORATORY LAPAROTOMY;  Surgeon: Greer Pickerel, MD;  Location: [place];  Service: General;  Laterality: N/A;  . LAPAROTOMY N/A 02/08/2016   Procedure: EXPLORATORY LAPAROTOMY;  Surgeon: Judeth Horn, MD;  Location: [place];  Service: General;  Laterality: N/A;  . LAPAROTOMY N/A 02/10/2016   Procedure: EXPLORATORY LAPAROTOMY;  Surgeon: Judeth Horn, MD;  Location: [place];  Service: General;  Laterality: N/A;  . VACUUM ASSISTED CLOSURE CHANGE N/A 02/12/2016   Procedure: EXPLORATORY LAPAROTOMY WITH CLOSURE;  Surgeon: Erroll Luna, MD;  Location: [place];  Service: General;  Laterality: N/A;   HPI:  50 year old white male with a past medical history of small bowel perforation on 01/27/16 s/p resection with  subsequent wound dehisence. He was d/c on 02/02/16. Developed septic shock, ischemic bowel, abscess. Pt was taken to the OR on 02/08/16 for exploratory surgery. Drained abscess, removed ileocecum. Pt intubated on 10/25 and extubated on 10/30.   Assessment / Plan / Recommendation Clinical Impression  Pt seen post 5 day intubation. He presented with normal vocal quality and a strong cough. Initial trial of an ice chip caused an immediate cough; however SLP suspects this was secretion related given the entire bolus remained on his tongue when coughing occurred. Trials of thin liquids and pureed solids were administered and no overt s/s of aspiration were observed. Given medical condition, recommend thin clear liquid diet with whole medication administered in pureed solids. Will continue to follow for diet advancement as pt medically improves.    Aspiration Risk  Mild aspiration risk    Diet Recommendation Thin liquid   Liquid Administration via: Cup;Straw;Spoon Medication Administration: Whole meds with puree Supervision: Patient able to self feed;Intermittent supervision to cue for compensatory strategies Compensations: Minimize environmental distractions;Slow rate;Small sips/bites Postural Changes: Seated upright at 90 degrees;Remain upright for at least 30 minutes after po intake    Other  Recommendations Oral Care Recommendations: Oral care BID   Follow up Recommendations Other (comment) (tba)      Frequency and Duration min 2x/week  2 weeks       Prognosis Prognosis for Safe Diet Advancement: Good      Swallow Study   General HPI: 50 year old white male with a past  medical history of small bowel perforation on 01/27/16 s/p resection with subsequent wound dehisence. He was d/c on 02/02/16. Developed septic shock, ischemic bowel, abscess. Pt was taken to the OR on 02/08/16 for exploratory surgery. Drained abscess, removed ileocecum. Pt intubated on 10/25 and extubated on 10/30. Type  of Study: Bedside Swallow Evaluation Previous Swallow Assessment: none in chart Diet Prior to this Study: NPO Temperature Spikes Noted: No Respiratory Status: Nasal cannula History of Recent Intubation: Yes Length of Intubations (days): 5 days Date extubated: 02/13/16 Behavior/Cognition: Cooperative;Alert;Pleasant mood Oral Care Completed by SLP: No Vision: Functional for self-feeding Self-Feeding Abilities: Able to feed self Patient Positioning: Upright in bed Baseline Vocal Quality: Normal Volitional Cough: Strong Volitional Swallow: Able to elicit    Oral/Motor/Sensory Function Overall Oral Motor/Sensory Function: Within functional limits   Ice Chips Ice chips: Impaired Presentation: Spoon Pharyngeal Phase Impairments: Cough - Immediate   Thin Liquid Thin Liquid: Within functional limits Presentation: Straw;Spoon;Cup;Self Fed    Nectar Thick Nectar Thick Liquid: Not tested   Honey Thick Honey Thick Liquid: Not tested   Puree Puree: Within functional limits Presentation: Spoon;Self Fed   Solid   GO   Solid: Not tested       Ezekiel Slocumb, Student SLP  Shela Leff 02/14/2016,1:59 PM

## 2016-02-15 ENCOUNTER — Inpatient Hospital Stay (HOSPITAL_COMMUNITY): Payer: Worker's Compensation

## 2016-02-15 DIAGNOSIS — Z932 Ileostomy status: Secondary | ICD-10-CM

## 2016-02-15 DIAGNOSIS — Z1624 Resistance to multiple antibiotics: Secondary | ICD-10-CM

## 2016-02-15 DIAGNOSIS — B9689 Other specified bacterial agents as the cause of diseases classified elsewhere: Secondary | ICD-10-CM

## 2016-02-15 DIAGNOSIS — B9620 Unspecified Escherichia coli [E. coli] as the cause of diseases classified elsewhere: Secondary | ICD-10-CM

## 2016-02-15 DIAGNOSIS — K651 Peritoneal abscess: Secondary | ICD-10-CM

## 2016-02-15 LAB — CBC
HCT: 22.3 % — ABNORMAL LOW (ref 39.0–52.0)
Hemoglobin: 7.7 g/dL — ABNORMAL LOW (ref 13.0–17.0)
MCH: 27.6 pg (ref 26.0–34.0)
MCHC: 34.5 g/dL (ref 30.0–36.0)
MCV: 79.9 fL (ref 78.0–100.0)
PLATELETS: 390 10*3/uL (ref 150–400)
RBC: 2.79 MIL/uL — AB (ref 4.22–5.81)
RDW: 15 % (ref 11.5–15.5)
WBC: 16.6 10*3/uL — AB (ref 4.0–10.5)

## 2016-02-15 LAB — BASIC METABOLIC PANEL
ANION GAP: 8 (ref 5–15)
BUN: 13 mg/dL (ref 6–20)
CHLORIDE: 97 mmol/L — AB (ref 101–111)
CO2: 31 mmol/L (ref 22–32)
CREATININE: 0.49 mg/dL — AB (ref 0.61–1.24)
Calcium: 7.6 mg/dL — ABNORMAL LOW (ref 8.9–10.3)
GFR calc non Af Amer: >60 mL/min (ref >60)
Glucose, Bld: 87 mg/dL (ref 65–99)
Potassium: 3.3 mmol/L — ABNORMAL LOW (ref 3.5–5.1)
SODIUM: 136 mmol/L (ref 135–145)

## 2016-02-15 LAB — GLUCOSE, CAPILLARY
GLUCOSE-CAPILLARY: 82 mg/dL (ref 65–99)
GLUCOSE-CAPILLARY: 107 mg/dL — AB (ref 65–99)
Glucose-Capillary: 77 mg/dL (ref 65–99)
Glucose-Capillary: 80 mg/dL (ref 65–99)

## 2016-02-15 LAB — HIV ANTIBODY (ROUTINE TESTING W REFLEX): HIV Screen 4th Generation wRfx: NONREACTIVE

## 2016-02-15 LAB — HEPATITIS C ANTIBODY: HCV Ab: <0.1 s/co ratio (ref 0.0–0.9)

## 2016-02-15 MED ORDER — POTASSIUM CHLORIDE 10 MEQ/50ML IV SOLN
10.0000 meq | INTRAVENOUS | Status: AC
  Administered 2016-02-15 (×2): 10 meq via INTRAVENOUS
  Filled 2016-02-15 (×2): qty 50

## 2016-02-15 MED ORDER — PANTOPRAZOLE SODIUM 40 MG PO TBEC
40.0000 mg | DELAYED_RELEASE_TABLET | Freq: Two times a day (BID) | ORAL | Status: DC
  Administered 2016-02-15 – 2016-02-27 (×25): 40 mg via ORAL
  Filled 2016-02-15 (×25): qty 1

## 2016-02-15 MED ORDER — LOPERAMIDE HCL 2 MG PO CAPS
4.0000 mg | ORAL_CAPSULE | Freq: Four times a day (QID) | ORAL | Status: DC
  Administered 2016-02-15 – 2016-02-21 (×28): 4 mg via ORAL
  Filled 2016-02-15 (×29): qty 2

## 2016-02-15 MED ORDER — BOOST / RESOURCE BREEZE PO LIQD
1.0000 | Freq: Three times a day (TID) | ORAL | Status: DC
  Administered 2016-02-15 – 2016-02-20 (×13): 1 via ORAL
  Filled 2016-02-15: qty 1

## 2016-02-15 NOTE — Progress Notes (Signed)
[place] [facility] Infectious [facility]   Reason for visit: Follow up on intraabdominal abscess  Interval History: on tigecycline, no worsening abdominal pain, up walking today.  Culture with MDR acinetobacter. Creat stable.   Total antibiotic days 9 Day 7 anidulafungin Day 2 tigecycline Day 3 tobramycin   Physical Exam: Constitutional:  Vitals:   02/15/16 0800 02/15/16 0832  BP: (!) 112/56   Pulse: 70   Resp: (!) 25   Temp:  98.4 F (36.9 C)   patient appears in NAD Eyes: anicteric Respiratory: Normal respiratory effort; CTA B Cardiovascular: RRR GI: + ostomy  Review of Systems: Constitutional: negative for fevers and chills Integument/breast: negative for rash  Lab Results  Component Value Date   WBC 16.6 (H) 02/15/2016   HGB 7.7 (L) 02/15/2016   HCT 22.3 (L) 02/15/2016   MCV 79.9 02/15/2016   PLT 390 02/15/2016    Lab Results  Component Value Date   CREATININE 0.49 (L) 02/15/2016   BUN 13 02/15/2016   NA 136 02/15/2016   K 3.3 (L) 02/15/2016   CL 97 (L) 02/15/2016   CO2 31 02/15/2016    Lab Results  Component Value Date   ALT 17 02/13/2016   AST 24 02/13/2016   ALKPHOS 139 (H) 02/13/2016     Microbiology: Recent Results (from the past 240 hour(s))  Blood Culture (routine x 2)     Status: None   Collection Time: 02/08/16  4:43 AM  Result Value Ref Range Status   Specimen Description BLOOD LEFT HAND  Final   Special Requests IN PEDIATRIC BOTTLE 3ML  Final   Culture NO GROWTH 5 DAYS  Final   Report Status 02/13/2016 FINAL  Final  Blood Culture (routine x 2)     Status: None   Collection Time: 02/08/16  5:00 AM  Result Value Ref Range Status   Specimen Description BLOOD RIGHT FOREARM  Final   Special Requests IN PEDIATRIC BOTTLE 2ML  Final   Culture NO GROWTH 5 DAYS  Final   Report Status 02/13/2016 FINAL  Final  Aerobic/Anaerobic Culture (surgical/deep wound)     Status: None   Collection Time: 02/08/16 11:55 AM  Result Value Ref Range  Status   Specimen Description PERITONEAL  Final   Special Requests POF PRIMAXIN AND VANCOMYCIN  Final   Gram Stain   Final    RARE WBC PRESENT,BOTH PMN AND MONONUCLEAR MODERATE GRAM NEGATIVE RODS RARE GRAM POSITIVE RODS RARE GRAM POSITIVE COCCI IN PAIRS AND CHAINS    Culture   Final    MODERATE ESCHERICHIA COLI FEW ACINETOBACTER CALCOACETICUS/BAUMANNII COMPLEX MULTI-DRUG RESISTANT ORGANISM MODERATE BACTEROIDES CACCAE BETA LACTAMASE POSITIVE    Report Status 02/13/2016 FINAL  Final   Organism ID, Bacteria ESCHERICHIA COLI  Final   Organism ID, Bacteria ACINETOBACTER CALCOACETICUS/BAUMANNII COMPLEX  Final      Susceptibility   Acinetobacter calcoaceticus/baumannii complex - MIC*    CEFTAZIDIME >=64 RESISTANT Resistant     CEFTRIAXONE >=64 RESISTANT Resistant     CIPROFLOXACIN >=4 RESISTANT Resistant     GENTAMICIN 8 INTERMEDIATE Intermediate     IMIPENEM >=16 RESISTANT Resistant     PIP/TAZO >=128 RESISTANT Resistant     TRIMETH/SULFA >=320 RESISTANT Resistant     CEFEPIME >=64 RESISTANT Resistant     AMPICILLIN/SULBACTAM 16 RESISTANT Resistant     * FEW ACINETOBACTER CALCOACETICUS/BAUMANNII COMPLEX   Escherichia coli - MIC*    AMPICILLIN >=32 RESISTANT Resistant     CEFAZOLIN <=4 SENSITIVE Sensitive  CEFEPIME <=1 SENSITIVE Sensitive     CEFTAZIDIME <=1 SENSITIVE Sensitive     CEFTRIAXONE <=1 SENSITIVE Sensitive     CIPROFLOXACIN <=0.25 SENSITIVE Sensitive     GENTAMICIN <=1 SENSITIVE Sensitive     IMIPENEM <=0.25 SENSITIVE Sensitive     TRIMETH/SULFA <=20 SENSITIVE Sensitive     AMPICILLIN/SULBACTAM >=32 RESISTANT Resistant     PIP/TAZO <=4 SENSITIVE Sensitive     Extended ESBL NEGATIVE Sensitive     * MODERATE ESCHERICHIA COLI  Urine culture     Status: None   Collection Time: 02/08/16  1:03 PM  Result Value Ref Range Status   Specimen Description URINE, RANDOM  Final   Special Requests NONE  Final   Culture NO GROWTH  Final   Report Status 02/10/2016 FINAL   Final  MRSA PCR Screening     Status: None   Collection Time: 02/08/16  1:15 PM  Result Value Ref Range Status   MRSA by PCR NEGATIVE NEGATIVE Final    Comment:        The GeneXpert MRSA Assay (FDA approved for NASAL specimens only), is one component of a comprehensive MRSA colonization surveillance program. It is not intended to diagnose MRSA infection nor to guide or monitor treatment for MRSA infections.     Impression/Plan:  1. intraabonminal abscess - on appropriate antibiotics and improving.  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  Monitor CMP daily.

## 2016-02-15 NOTE — Progress Notes (Signed)
3 Days Post-Op  Subjective: Tolerating po Ostomy output remains high  Objective: Vital signs in last 24 hours: Temp:  [98.5 F (36.9 C)-98.8 F (37.1 C)] 98.8 F (37.1 C) (11/01 0453) Pulse Rate:  [71-83] 75 (11/01 0700) Resp:  [12-34] 20 (11/01 0700) BP: (97-141)/(50-77) 100/50 (11/01 0700) SpO2:  [90 %-100 %] 95 % (11/01 0700) Last BM Date: 02/14/16  Intake/Output from previous day: 10/31 0701 - 11/01 0700 In: 2722.9 [P.O.:960; I.V.:720.1; IV Piggyback:1042.8] Out: [id number] [Urine:2275; [id number] Intake/Output this shift: No intake/output data recorded.  Exam: Abdomen soft, ostomy pink Incision clean  Lab Results:   Recent Labs  02/14/16 1736 02/15/16 0250  WBC 16.5* 16.6*  HGB 7.6* 7.7*  HCT 22.7* 22.3*  PLT 357 390   BMET  Recent Labs  02/14/16 1530 02/15/16 0250  NA 135 136  K 3.5 3.3*  CL 98* 97*  CO2 31 31  GLUCOSE 112* 87  BUN 9 13  CREATININE 0.45* 0.49*  CALCIUM 7.7* 7.6*   PT/INR No results for input(s): LABPROT, INR in the last 72 hours. ABG  Recent Labs  02/13/16 0352  PHART 7.505*  HCO3 31.7*    Studies/Results: Dg Chest Port 1 View  Result Date: 02/15/2016 CLINICAL DATA:  Acute respiratory failure.  Pleural effusion. EXAM: PORTABLE CHEST 1 VIEW COMPARISON:  02/13/2016 FINDINGS: Enteric tube, endotracheal tube, and right jugular catheter have been removed. Right PICC remains in place with tip projecting over the cavoatrial junction/ high right atrium. The cardiomediastinal silhouette is within normal limits. Left greater than right basilar lung opacities appear minimally improved compared to the prior study. A small left pleural effusion is suspected. No pneumothorax. IMPRESSION: Minimally improved left greater than right basilar atelectasis or infiltrates. Electronically Signed   By: Logan Bores M.D.   On: 02/15/2016 07:13    Anti-infectives: Anti-infectives    Start     Dose/Rate Route Frequency Ordered Stop   02/15/16 0031   tigecycline (TYGACIL) 50 mg in sodium chloride 0.9 % 100 mL IVPB     50 mg 200 mL/hr over 30 Minutes Intravenous Every 12 hours 02/14/16 1232     02/14/16 1400  tobramycin (NEBCIN) 510 mg in dextrose 5 % 100 mL IVPB     7 mg/kg  72.9 kg 112.8 mL/hr over 60 Minutes Intravenous Every 24 hours 02/14/16 1241     02/14/16 1245  tigecycline (TYGACIL) 100 mg in sodium chloride 0.9 % 100 mL IVPB     100 mg 200 mL/hr over 30 Minutes Intravenous  Once 02/14/16 1232 02/14/16 1342   02/13/16 1200  cefTRIAXone (ROCEPHIN) 2 g in dextrose 5 % 50 mL IVPB  Status:  Discontinued     2 g 100 mL/hr over 30 Minutes Intravenous Every 24 hours 02/13/16 0857 02/14/16 1205   02/13/16 1200  tobramycin (NEBCIN) 530 mg in dextrose 5 % 100 mL IVPB  Status:  Discontinued     7 mg/kg  75.5 kg (Adjusted) 113.3 mL/hr over 60 Minutes Intravenous Every 24 hours 02/13/16 1123 02/14/16 1234   02/10/16 1500  anidulafungin (ERAXIS) 100 mg in sodium chloride 0.9 % 100 mL IVPB     100 mg over 90 Minutes Intravenous Every 24 hours 02/09/16 0804 02/17/16 1459   02/10/16 1500  vancomycin (VANCOCIN) IVPB 1000 mg/200 mL premix  Status:  Discontinued     1,000 mg 200 mL/hr over 60 Minutes Intravenous Every 8 hours 02/10/16 1239 02/13/16 0856   02/10/16 1500  imipenem-cilastatin (PRIMAXIN) 1,000 mg in  sodium chloride 0.9 % 250 mL IVPB  Status:  Discontinued     1,000 mg 250 mL/hr over 60 Minutes Intravenous Every 8 hours 02/10/16 1239 02/13/16 0856   02/09/16 1000  imipenem-cilastatin (PRIMAXIN) 1,000 mg in sodium chloride 0.9 % 250 mL IVPB  Status:  Discontinued     1,000 mg 250 mL/hr over 60 Minutes Intravenous Every 8 hours 02/09/16 0812 02/10/16 1239   02/09/16 1000  vancomycin (VANCOCIN) IVPB 1000 mg/200 mL premix  Status:  Discontinued     1,000 mg 200 mL/hr over 60 Minutes Intravenous Every 8 hours 02/09/16 0812 02/10/16 1239   02/09/16 0900  anidulafungin (ERAXIS) 200 mg in sodium chloride 0.9 % 200 mL IVPB     200  mg over 180 Minutes Intravenous  Once 02/09/16 0804 02/09/16 1209   02/09/16 0830  anidulafungin (ERAXIS) 100 mg in sodium chloride 0.9 % 100 mL IVPB  Status:  Discontinued     100 mg over 90 Minutes Intravenous Every 24 hours 02/08/16 0811 02/09/16 0804   02/08/16 1800  vancomycin (VANCOCIN) IVPB 750 mg/150 ml premix  Status:  Discontinued     750 mg 150 mL/hr over 60 Minutes Intravenous Every 8 hours 02/08/16 0847 02/09/16 0812   02/08/16 1400  piperacillin-tazobactam (ZOSYN) IVPB 3.375 g  Status:  Discontinued     3.375 g 12.5 mL/hr over 240 Minutes Intravenous Every 8 hours 02/08/16 0432 02/08/16 0802   02/08/16 0900  imipenem-cilastatin (PRIMAXIN) 500 mg in sodium chloride 0.9 % 100 mL IVPB  Status:  Discontinued     500 mg 200 mL/hr over 30 Minutes Intravenous Every 6 hours 02/08/16 0818 02/09/16 0812   02/08/16 0830  ertapenem (INVANZ) 1 g in sodium chloride 0.9 % 50 mL IVPB  Status:  Discontinued     1 g 100 mL/hr over 30 Minutes Intravenous Every 24 hours 02/08/16 0756 02/08/16 0812   02/08/16 0830  anidulafungin (ERAXIS) 200 mg in sodium chloride 0.9 % 200 mL IVPB  Status:  Discontinued     200 mg over 180 Minutes Intravenous  Once 02/08/16 0811 02/09/16 1457   02/08/16 0830  vancomycin (VANCOCIN) 1,500 mg in sodium chloride 0.9 % 500 mL IVPB  Status:  Discontinued     1,500 mg 250 mL/hr over 120 Minutes Intravenous  Once 02/08/16 0815 02/09/16 1355   02/08/16 0815  vancomycin (VANCOCIN) 1,500 mg in sodium chloride 0.9 % 500 mL IVPB  Status:  Discontinued     1,500 mg 250 mL/hr over 120 Minutes Intravenous Every 12 hours 02/08/16 0811 02/08/16 0812   02/08/16 0430  piperacillin-tazobactam (ZOSYN) IVPB 3.375 g     3.375 g 100 mL/hr over 30 Minutes Intravenous  Once 02/08/16 0419 02/08/16 0532      Assessment/Plan: s/p Procedure(s): EXPLORATORY LAPAROTOMY WITH CLOSURE (N/A)  Will try Imodium to decrease ostomy output.  Try to keep hydrated Follow WBC.  Only POD#3 so doubt  intra-abdominal issue Continue antibiotics Wound care  LOS: 7 days    [name] A 02/15/2016

## 2016-02-15 NOTE — Progress Notes (Signed)
Nutrition Follow-up  DOCUMENTATION CODES:   Severe malnutrition in context of acute illness/injury  INTERVENTION:  Provide Boost Breeze po TID, each supplement provides 250 kcal and 9 grams of protein.  Encourage adequate PO intake.   NUTRITION DIAGNOSIS:   Malnutrition (Severe) related to acute illness (recent SB perforation) as evidenced by severe depletion of muscle mass, severe fluid accumulation, severe depletion of body fat; ongoing  GOAL:   Patient will meet greater than or equal to 90% of their needs; not met  MONITOR:   PO intake, Supplement acceptance, Diet advancement, Labs, Weight trends, Skin, I & O's  REASON FOR ASSESSMENT:   Consult New TPN/TNA  ASSESSMENT:   Pt with a past medical hx of small bowel perforation on 01/27/16 s/p resection with subsequent wound dehisence. He was d/c on 02/02/16. Developed septic shock, ischemic bowel, abscess. Pt was taken to the OR on 02/08/16 for exploratory surgery, wound vac placement and ileocectomy with open abdomen.  Extubated 10/30.  Procedure (10/29): Exploratory laparotomy with removal of vacuum pack dressing and closure of abdominal wall  Pt is currently on a clear liquid diet. Meal completion 10%. Pt reports abdominal discomfort postprandial. Pt is agreeable to nutritional supplements to aid in caloric and protein needs. RD to order [facility]. Pt is knowledgeable on the importance of adequate protein and calorie intake.   Labs and medications reviewed.   Diet Order:  Diet clear liquid Room service appropriate? Yes; Fluid consistency: Thin  Skin:  Wound (see comment) (abdominal incision)  Last BM:  11/1-ileostomy 325 ml  Height:   Ht Readings from Last 1 Encounters:  02/08/16 5' 9"  (1.753 m)    Weight:   Wt Readings from Last 1 Encounters:  02/14/16 160 lb 11.5 oz (72.9 kg)    Ideal Body Weight:  72.7 kg  BMI:  Body mass index is 23.73 kg/m.  Estimated Nutritional Needs:   Kcal:   2100-2300  Protein:  120-140 grams  Fluid:  > 2 L/day  EDUCATION NEEDS:   No education needs identified at this time  Corrin Parker, MS, RD, LDN Pager # [phone] After hours/ weekend pager # [phone]

## 2016-02-15 NOTE — Evaluation (Addendum)
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 Pt with recent admission for small bowel perf on 10/13 with resection and D/C 10/19. PMHx asthma , back sx  Clinical Impression  Pt with limited interest in mobilizing but with encouragement able to participate and ambulate around unit. Pt reports pain in back and abdomen limiting him. Pt with decreased strength and mobility who will benefit from acute therapy to maximize function, gait and independence to decrease burden of care. Will follow acutely and recommend daily mobility with nursing assist.  94% on RA HR 90    Follow Up Recommendations Home health PT;Supervision for mobility/OOB    Equipment Recommendations       Recommendations for Other Services OT consult     Precautions / Restrictions Precautions Precautions: Fall Precaution Comments: ostomy      Mobility  Bed Mobility Overal bed mobility: Needs Assistance Bed Mobility: Supine to Sit     Supine to sit: Min guard     General bed mobility comments: cues for sequence, increased time and assist to manage lines  Transfers Overall transfer level: Needs assistance   Transfers: Sit to/from Stand Sit to Stand: Min guard         General transfer comment: cues for hand placement  Ambulation/Gait Ambulation/Gait assistance: Min assist Ambulation Distance (Feet): 150 Feet Assistive device: Rolling walker (2 wheeled) Gait Pattern/deviations: Step-through pattern;Trunk flexed   Gait velocity interpretation: Below normal speed for age/gender General Gait Details: cues for posture and position in RW  Stairs            Wheelchair Mobility    Modified Rankin (Stroke Patients Only)       Balance Overall balance assessment: Needs  assistance   Sitting balance-Leahy Scale: Good       Standing balance-Leahy Scale: Fair                               Pertinent Vitals/Pain Pain Assessment: 0-10 Pain Score: 10-Worst pain ever Pain Location: abdomen and back Pain Intervention(s): Limited activity within patient's tolerance;Monitored during session;Repositioned;Patient requesting pain meds-RN notified    Home Living Family/patient expects to be discharged to:: Private residence Living Arrangements: Spouse/significant other Available Help at Discharge: Family Type of Home: House Home Access: Ramped entrance     Home Layout: One level Home Equipment: Environmental consultant - 2 wheels      Prior Function Level of Independence: Needs assistance   Gait / Transfers Assistance Needed: pt states he was not walking much since DC on the 19th  ADL's / Homemaking Assistance Needed: wife assisting for bathing and dressing since last admission  Comments: pt was independent prior to admission 01/27/16, sleeps in a recliner due to bad back     Hand Dominance        Extremity/Trunk Assessment   Upper Extremity Assessment: Overall WFL for tasks assessed           Lower Extremity Assessment: Overall WFL for tasks assessed      Cervical / Trunk Assessment: Kyphotic  Communication      Cognition Arousal/Alertness: Awake/alert Behavior During Therapy: WFL for tasks assessed/performed Overall Cognitive Status: Impaired/Different from baseline Area of Impairment: Orientation Orientation Level: Time  General Comments      Exercises     Assessment/Plan    PT Assessment Patient needs continued PT services  PT Problem List Decreased strength;Decreased mobility;Decreased activity tolerance;Decreased balance;Pain;Decreased knowledge of use of DME          PT Treatment Interventions DME instruction;Gait training;Functional mobility training;Therapeutic exercise;Patient/family  education;Therapeutic activities;Balance training    PT Goals (Current goals can be found in the Care Plan section)  Acute Rehab PT Goals Patient Stated Goal: return home PT Goal Formulation: With patient Time For Goal Achievement: 02/29/16 Potential to Achieve Goals: Good    Frequency Min 3X/week   Barriers to discharge        Co-evaluation               End of Session Equipment Utilized During Treatment: Gait belt Activity Tolerance: Patient tolerated treatment well Patient left: in chair;with chair alarm set;with nursing/sitter in room;with call bell/phone within reach Nurse Communication: Mobility status         Time: [id number] PT Time Calculation (min) (ACUTE ONLY): 20 min   Charges:   PT Evaluation $PT Eval Moderate Complexity: 1 Procedure     PT G CodesMelford Aase 02/15/2016, 10:41 AM  Elwyn Reach, [place]

## 2016-02-15 NOTE — Consult Note (Signed)
[name] Nurse ostomy consult note Stoma type/location:  RLQ, end ileostomy Stomal assessment/size: just smaller than 2", pink, moist, budded Peristomal assessment: did not change pouch today Treatment options for stomal/peristomal skin:  Patient does have MARSI, will add silicone foam to this area and avoid using adhesive over this skin  Output liquid green Ostomy pouching: 2pc.  Education provided:  Introduced myself and ostomy nurse role. Explained stoma creation and answered patient's questions.  Patient reports wife lives at home and will be support for ostomy care.  He is a little confused today, not able to remember his home address.  But verbalized appropriate questions with me.   Enrolled patient in [place] program: Yes  [place] Nurse will follow along with you for continued support with ostomy teaching and care Jule Schlabach [facility] MSN, RN, [place], [place]

## 2016-02-15 NOTE — Progress Notes (Signed)
[place] ICU Electrolyte Replacement Protocol  Patient Name: [name] DOB: 17-Sep-1965 MRN: [id number]  Date of Service  02/15/2016   HPI/Events of Note    Recent Labs Lab 02/10/16 0402  02/11/16 0406 02/12/16 0555 02/12/16 1734 02/13/16 0601 02/14/16 0345 02/14/16 1530 02/15/16 0250  NA 132*  < > 134* 135 132* 131* 134* 135 136  K 2.9*  < > 3.8 2.9* 3.0* 3.2* 2.6* 3.5 3.3*  CL 105  < > 105 104 98* 97* 96* 98* 97*  CO2 23  < > 25 27 28 28  32 31 31  GLUCOSE 91  < > 108* 99 114* 122* 129* 112* 87  BUN 14  < > 10 5* <5* 5* 8 9 13   CREATININE 0.54*  < > 0.55* 0.33* 0.41* 0.41* 0.44* 0.45* 0.49*  CALCIUM 6.8*  < > 7.0* 6.9* 7.0* 6.8* 7.3* 7.7* 7.6*  MG 1.9  --  2.1 1.7  --  1.7 2.0  --   --   PHOS 2.1*  --  1.8* 2.7  --  3.2 3.2  --   --   < > = values in this interval not displayed.  Estimated Creatinine Clearance: 111.7 mL/min (by C-G formula based on SCr of 0.49 mg/dL (L)).  Intake/Output      10/31 0701 - 11/01 0700   P.O. 960   I.V. (mL/kg) 720.1 (9.9)   IV Piggyback 1042.8   Total Intake(mL/kg) 2722.9 (37.4)   Urine (mL/kg/hr) 2275 (1.3)   Stool 1650 (0.9)   Total Output 3925   Net -1202.1       Urine Occurrence 3 x    - I/O DETAILED x24h    Total I/O In: 1150 [P.O.:900; IV Piggyback:250] Out: [id number] [Urine:700; Stool:950] - I/O THIS SHIFT    ASSESSMENT   eICURN Interventions  K+ replaced using ICU protocol   ASSESSMENT: [name], [name] 02/15/2016, 6:18 AM

## 2016-02-15 NOTE — Progress Notes (Signed)
[facility] [place]  [id number] DOB: 06-Apr-1966 DOA: 02/08/2016 PCP: No primary care provider on file.    Brief Narrative:  50 year old male with a history of small bowel perforation on 01/27/16 s/p resection with subsequent anastomotic leak w/ wound dehisence. He was d/c on 02/02/16.  He required re-admit as he developed septic shock, ischemic bowel, and an abscess.  He was taken to the OR on 02/08/16 for abscess drainage, and ileocecum removal.  Significant Events: 10/25 - readmitted - Intubated and central access obtained - Exploratory surgery, wound vac placement and ileocectomy  10/27 - Exploratory surgery-peritonitis, distal ileum ischemia , s/p resection w/ ileostomy  10/29 - Exploratory laparotomy with removal of vacuum pack dressing and closure of abdominal wall  10/30 - Extubated 11/1 - [facility] assumed care  Subjective: The patient is sitting up in a bedside chair.  He reports some ongoing abdominal discomfort.  He denies chest pain fevers chills nausea or vomiting.  He is in good spirits alert and oriented.  Assessment & Plan:  Ischemic bowel - perf at anastomosis site - abdominal abscess - s/p ileostomy Ongoing care per general surgery - antibiotic management per ID  L Pleural Effusion  Small on f/u CXR - follow w/o intervention for now   Septic shock Resolved  Lower extremity edema resolving  Hyopkalemia Replace and follow  AKI Resolved - creatinine is normal  Severe protein cal malnutrition  Advance diet as per general surgery  Normocytic Anemia No evidence of active bleeding - follow trend  DVT prophylaxis: Lovenox Code Status: FULL CODE Family Communication: no family present at time of exam  Disposition Plan: SDU  Consultants:  PCCM ID  Gen Surgery   Antimicrobials:  10/25 vanc > 10/30 10/25 imipenem > 10/30 10/30 ceftriaxone  > stop 11/3 10/25 anidulafungin > stop 11/2 10/30 tobramycin  Objective: Blood  pressure (!) 112/56, pulse 70, temperature 98.4 F (36.9 C), temperature source Oral, resp. rate (!) 25, height 5\' 9"  (1.753 m), weight 72.9 kg (160 lb 11.5 oz), SpO2 96 %.  Intake/Output Summary (Last 24 hours) at 02/15/16 0903 Last data filed at 02/15/16 0800  Gross per 24 hour  Intake          2506.89 ml  Output             4425 ml  Net         -1918.11 ml   Filed Weights   02/09/16 0959 02/13/16 0350 02/14/16 0400  Weight: 81.9 kg (180 lb 8.9 oz) 82.6 kg (182 lb 1.6 oz) 72.9 kg (160 lb 11.5 oz)    Examination: General: No acute respiratory distress Lungs: Clear to auscultation bilaterally without wheezes or crackles Cardiovascular: Regular rate and rhythm without murmur gallop or rub normal S1 and S2 Abdomen: ostomy in R abdom w/ large volume fluid in bag - nondistended - soft  Extremities: No significant cyanosis, or clubbing - trace edema bilateral lower extremities  CBC:  Recent Labs Lab 02/09/16 0515 02/10/16 0402  02/12/16 0555 02/13/16 0601 02/14/16 0345 02/14/16 1736 02/15/16 0250  WBC 28.9* 31.7*  < > 13.2* 13.7* 18.1* 16.5* 16.6*  NEUTROABS 24.9* 28.2*  --   --  10.0*  --  13.0*  --   HGB 9.4* 7.5*  < > 8.1* 10.0* 7.7* 7.6* 7.7*  HCT 27.9* 22.2*  < > 24.0* 29.6* 22.5* 22.7* 22.3*  MCV 79.0 81.0  < > 79.5 80.9 79.5 80.2 79.9  PLT 534* 390  < > 265 248 295 357 390  < > = values in this interval not displayed. Basic Metabolic Panel:  Recent Labs Lab 02/10/16 0402  02/11/16 0406 02/12/16 0555 02/12/16 1734 02/13/16 0601 02/14/16 0345 02/14/16 1530 02/15/16 0250  NA 132*  < > 134* 135 132* 131* 134* 135 136  K 2.9*  < > 3.8 2.9* 3.0* 3.2* 2.6* 3.5 3.3*  CL 105  < > 105 104 98* 97* 96* 98* 97*  CO2 23  < > 25 27 28 28  32 31 31  GLUCOSE 91  < > 108* 99 114* 122* 129* 112* 87  BUN 14  < > 10 5* <5* 5* 8 9 13   CREATININE 0.54*  < > 0.55* 0.33* 0.41* 0.41* 0.44* 0.45* 0.49*  CALCIUM 6.8*  < > 7.0* 6.9* 7.0* 6.8* 7.3* 7.7* 7.6*  MG 1.9  --  2.1 1.7  --   1.7 2.0  --   --   PHOS 2.1*  --  1.8* 2.7  --  3.2 3.2  --   --   < > = values in this interval not displayed. GFR: Estimated Creatinine Clearance: 111.7 mL/min (by C-G formula based on SCr of 0.49 mg/dL (L)).  Liver Function Tests:  Recent Labs Lab 02/09/16 0515 02/10/16 0402 02/13/16 0601  AST 26 23 24   ALT 18 17 17   ALKPHOS 97 114 139*  BILITOT 0.5 0.3 0.5  PROT 3.8* 3.9* 3.8*  ALBUMIN 1.2* 1.1* 1.1*    Coagulation Profile:  Recent Labs Lab 02/08/16 0928 02/09/16 0830 02/10/16 0800  INR 1.71 1.67 1.44    Cardiac Enzymes:  Recent Labs Lab 02/08/16 0928  TROPONINI 0.03*    CBG:  Recent Labs Lab 02/14/16 1531 02/14/16 2036 02/14/16 2335 02/15/16 0451 02/15/16 0828  GLUCAP 113* 84 85 82 77    Recent Results (from the past 240 hour(s))  Blood Culture (routine x 2)     Status: None   Collection Time: 02/08/16  4:43 AM  Result Value Ref Range Status   Specimen Description BLOOD LEFT HAND  Final   Special Requests IN PEDIATRIC BOTTLE 3ML  Final   Culture NO GROWTH 5 DAYS  Final   Report Status 02/13/2016 FINAL  Final  Blood Culture (routine x 2)     Status: None   Collection Time: 02/08/16  5:00 AM  Result Value Ref Range Status   Specimen Description BLOOD RIGHT FOREARM  Final   Special Requests IN PEDIATRIC BOTTLE 2ML  Final   Culture NO GROWTH 5 DAYS  Final   Report Status 02/13/2016 FINAL  Final  Aerobic/Anaerobic Culture (surgical/deep wound)     Status: None   Collection Time: 02/08/16 11:55 AM  Result Value Ref Range Status   Specimen Description PERITONEAL  Final   Special Requests POF PRIMAXIN AND VANCOMYCIN  Final   Gram Stain   Final    RARE WBC PRESENT,BOTH PMN AND MONONUCLEAR MODERATE GRAM NEGATIVE RODS RARE GRAM POSITIVE RODS RARE GRAM POSITIVE COCCI IN PAIRS AND CHAINS    Culture   Final    MODERATE ESCHERICHIA COLI FEW ACINETOBACTER CALCOACETICUS/BAUMANNII COMPLEX MULTI-DRUG RESISTANT ORGANISM MODERATE BACTEROIDES  CACCAE BETA LACTAMASE POSITIVE    Report Status 02/13/2016 FINAL  Final   Organism ID, Bacteria ESCHERICHIA COLI  Final   Organism ID, Bacteria ACINETOBACTER CALCOACETICUS/BAUMANNII COMPLEX  Final      Susceptibility   Acinetobacter calcoaceticus/baumannii complex - MIC*    CEFTAZIDIME >=64 RESISTANT Resistant  CEFTRIAXONE >=64 RESISTANT Resistant     CIPROFLOXACIN >=4 RESISTANT Resistant     GENTAMICIN 8 INTERMEDIATE Intermediate     IMIPENEM >=16 RESISTANT Resistant     PIP/TAZO >=128 RESISTANT Resistant     TRIMETH/SULFA >=320 RESISTANT Resistant     CEFEPIME >=64 RESISTANT Resistant     AMPICILLIN/SULBACTAM 16 RESISTANT Resistant     * FEW ACINETOBACTER CALCOACETICUS/BAUMANNII COMPLEX   Escherichia coli - MIC*    AMPICILLIN >=32 RESISTANT Resistant     CEFAZOLIN <=4 SENSITIVE Sensitive     CEFEPIME <=1 SENSITIVE Sensitive     CEFTAZIDIME <=1 SENSITIVE Sensitive     CEFTRIAXONE <=1 SENSITIVE Sensitive     CIPROFLOXACIN <=0.25 SENSITIVE Sensitive     GENTAMICIN <=1 SENSITIVE Sensitive     IMIPENEM <=0.25 SENSITIVE Sensitive     TRIMETH/SULFA <=20 SENSITIVE Sensitive     AMPICILLIN/SULBACTAM >=32 RESISTANT Resistant     PIP/TAZO <=4 SENSITIVE Sensitive     Extended ESBL NEGATIVE Sensitive     * MODERATE ESCHERICHIA COLI  Urine culture     Status: None   Collection Time: 02/08/16  1:03 PM  Result Value Ref Range Status   Specimen Description URINE, RANDOM  Final   Special Requests NONE  Final   Culture NO GROWTH  Final   Report Status 02/10/2016 FINAL  Final  MRSA PCR Screening     Status: None   Collection Time: 02/08/16  1:15 PM  Result Value Ref Range Status   MRSA by PCR NEGATIVE NEGATIVE Final    Comment:        The GeneXpert MRSA Assay (FDA approved for NASAL specimens only), is one component of a comprehensive MRSA colonization surveillance program. It is not intended to diagnose MRSA infection nor to guide or monitor treatment for MRSA infections.       Scheduled Meds: . anidulafungin  100 mg Intravenous Q24H  . chlorhexidine  15 mL Mouth Rinse BID  . enoxaparin (LOVENOX) injection  40 mg Subcutaneous Q24H  . famotidine (PEPCID) IV  20 mg Intravenous Q12H  . furosemide  20 mg Intravenous BID  . loperamide  4 mg Oral QID  . mouth rinse  15 mL Mouth Rinse q12n4p  . sodium chloride flush  10-40 mL Intracatheter Q12H  . tigecycline (TYGACIL) IVPB  50 mg Intravenous Q12H  . tobramycin  7 mg/kg Intravenous Q24H   Continuous Infusions: . fentaNYL infusion INTRAVENOUS Stopped (02/13/16 0900)     LOS: 7 days   Cherene Altes, MD [facility] Office  [phone] Pager - Text Page per Amion as per below:  On-Call/Text Page:      [email or web address]      password [email or web address]  If 7PM-7AM, please contact night-coverage [email or web address] Password [email or web address] 02/15/2016, 9:03 AM

## 2016-02-16 DIAGNOSIS — J90 Pleural effusion, not elsewhere classified: Secondary | ICD-10-CM

## 2016-02-16 DIAGNOSIS — N179 Acute kidney failure, unspecified: Secondary | ICD-10-CM

## 2016-02-16 DIAGNOSIS — IMO0002 Reserved for concepts with insufficient information to code with codable children: Secondary | ICD-10-CM

## 2016-02-16 DIAGNOSIS — K631 Perforation of intestine (nontraumatic): Secondary | ICD-10-CM

## 2016-02-16 DIAGNOSIS — K559 Vascular disorder of intestine, unspecified: Secondary | ICD-10-CM

## 2016-02-16 LAB — GLUCOSE, CAPILLARY: Glucose-Capillary: 79 mg/dL (ref 65–99)

## 2016-02-16 LAB — COMPREHENSIVE METABOLIC PANEL
ALBUMIN: 1.5 g/dL — AB (ref 3.5–5.0)
ALT: 20 U/L (ref 17–63)
AST: 25 U/L (ref 15–41)
Alkaline Phosphatase: 141 U/L — ABNORMAL HIGH (ref 38–126)
Anion gap: 8 (ref 5–15)
BUN: 20 mg/dL (ref 6–20)
CHLORIDE: 100 mmol/L — AB (ref 101–111)
CO2: 28 mmol/L (ref 22–32)
CREATININE: 0.53 mg/dL — AB (ref 0.61–1.24)
Calcium: 7.4 mg/dL — ABNORMAL LOW (ref 8.9–10.3)
GFR calc non Af Amer: >60 mL/min (ref >60)
GLUCOSE: 80 mg/dL (ref 65–99)
Potassium: 3.2 mmol/L — ABNORMAL LOW (ref 3.5–5.1)
SODIUM: 136 mmol/L (ref 135–145)
Total Bilirubin: 0.4 mg/dL (ref 0.3–1.2)
Total Protein: 4.9 g/dL — ABNORMAL LOW (ref 6.5–8.1)

## 2016-02-16 LAB — CBC
HCT: 22.7 % — ABNORMAL LOW (ref 39.0–52.0)
Hemoglobin: 7.7 g/dL — ABNORMAL LOW (ref 13.0–17.0)
MCH: 27.1 pg (ref 26.0–34.0)
MCHC: 33.9 g/dL (ref 30.0–36.0)
MCV: 79.9 fL (ref 78.0–100.0)
PLATELETS: 494 10*3/uL — AB (ref 150–400)
RBC: 2.84 MIL/uL — AB (ref 4.22–5.81)
RDW: 15.3 % (ref 11.5–15.5)
WBC: 15.1 10*3/uL — ABNORMAL HIGH (ref 4.0–10.5)

## 2016-02-16 LAB — PHOSPHORUS: Phosphorus: 3.8 mg/dL (ref 2.5–4.6)

## 2016-02-16 LAB — POTASSIUM: Potassium: 3.8 mmol/L (ref 3.5–5.1)

## 2016-02-16 LAB — MAGNESIUM: MAGNESIUM: 2 mg/dL (ref 1.7–2.4)

## 2016-02-16 MED ORDER — HYDROMORPHONE HCL 2 MG/ML IJ SOLN
1.0000 mg | INTRAMUSCULAR | Status: DC | PRN
  Administered 2016-02-17 – 2016-02-24 (×43): 1 mg via INTRAVENOUS
  Filled 2016-02-16 (×44): qty 1

## 2016-02-16 MED ORDER — SODIUM CHLORIDE 0.9% FLUSH
10.0000 mL | INTRAVENOUS | Status: DC | PRN
  Administered 2016-02-19 (×2): 10 mL
  Administered 2016-02-20: 30 mL
  Administered 2016-02-20: 20 mL
  Administered 2016-02-26 – 2016-02-27 (×2): 10 mL
  Administered 2016-03-01: 20 mL
  Administered 2016-03-02 – 2016-03-03 (×2): 10 mL
  Administered 2016-03-04: 20 mL
  Filled 2016-02-16 (×10): qty 40

## 2016-02-16 MED ORDER — POTASSIUM CHLORIDE 10 MEQ/100ML IV SOLN
10.0000 meq | INTRAVENOUS | Status: AC
  Administered 2016-02-16 (×5): 10 meq via INTRAVENOUS
  Filled 2016-02-16 (×5): qty 100

## 2016-02-16 NOTE — Care Management Note (Signed)
Case Management Note Original note created by Ellan Lambert 02/08/16  Patient Details  Name: [name] MRN: [id number] Date of Birth: 08-15-1965  Subjective/Objective:  Pt admitted on 02/08/16  to 18M s/p exp lap, ileocecectomy, and application of wound VAC.   PTA, pt resided at home with wife and was receiving [facility] services from [facility], per Bo Merino, Worker's Comp Case Manager.  (phone: [phone], fax:  [phone]).  Cecille Rubin states pt's initial [place] injury was in 2008, but this issue appears to be related to pt's chronic narcotic use, and WC plans to cover it.  She plans to call and update admitting with [facility] information today.  She states pt was in hospital about a week ago with a bowel perforation requiring surgery, and had a very difficult time controlling his pain post-op.               Action/Plan: Will follow for discharge planning as pt progresses.  Faxed available clinical information to Bo Merino, [facility] Case Manager, per her request, to above fax #.    Expected Discharge Date:                Expected Discharge Plan:  [place]  In-House Referral:  NA  Discharge planning Services  CM Consult  Post Acute Care Choice:  Resumption of Svcs/PTA Provider, Home Health Choice offered to:     DME Arranged:    DME Agency:  [facility]  HH Arranged:    [place] Agency:     Status of Service:  In process, will continue to follow  If discussed at Long Length of Stay Meetings, dates discussed:    Additional Comments: 02/16/2016 Discussed in LOS 02/16/16:  Pt remains appropriate for continued stay.  WC CM aware of pt status.  IV antibiotics changed yesterday.  Transfer to SD orders written. Reinaldo Raddle, RN, BSN  Trauma/Neuro ICU Case Manager [phone]

## 2016-02-16 NOTE — Progress Notes (Signed)
[facility] for Infectious Disease   Reason for visit: Follow up on intraabdominal abscess  Interval History: on tigecycline, no worsening abdominal pain, up walking today.  Culture with MDR acinetobacter. No new issues.   Total antibiotic days 10 Day 3/7 tigecycline Day 4 tobramycin   Physical Exam: Constitutional:  Vitals:   02/16/16 1200 02/16/16 1400  BP: 113/72 123/68  Pulse: 71   Resp: (!) 27 (!) 23  Temp:     patient appears in NAD Eyes: anicteric Respiratory: Normal respiratory effort; CTA B Cardiovascular: RRR GI: + ostomy  Review of Systems: Constitutional: negative for fevers and chills Integument/breast: negative for rash  Lab Results  Component Value Date   WBC 15.1 (H) 02/16/2016   HGB 7.7 (L) 02/16/2016   HCT 22.7 (L) 02/16/2016   MCV 79.9 02/16/2016   PLT 494 (H) 02/16/2016    Lab Results  Component Value Date   CREATININE 0.53 (L) 02/16/2016   BUN 20 02/16/2016   NA 136 02/16/2016   K 3.2 (L) 02/16/2016   CL 100 (L) 02/16/2016   CO2 28 02/16/2016    Lab Results  Component Value Date   ALT 20 02/16/2016   AST 25 02/16/2016   ALKPHOS 141 (H) 02/16/2016     Microbiology: Recent Results (from the past 240 hour(s))  Blood Culture (routine x 2)     Status: None   Collection Time: 02/08/16  4:43 AM  Result Value Ref Range Status   Specimen Description BLOOD LEFT HAND  Final   Special Requests IN PEDIATRIC BOTTLE 3ML  Final   Culture NO GROWTH 5 DAYS  Final   Report Status 02/13/2016 FINAL  Final  Blood Culture (routine x 2)     Status: None   Collection Time: 02/08/16  5:00 AM  Result Value Ref Range Status   Specimen Description BLOOD RIGHT FOREARM  Final   Special Requests IN PEDIATRIC BOTTLE 2ML  Final   Culture NO GROWTH 5 DAYS  Final   Report Status 02/13/2016 FINAL  Final  Aerobic/Anaerobic Culture (surgical/deep wound)     Status: None   Collection Time: 02/08/16 11:55 AM  Result Value Ref Range Status   Specimen  Description PERITONEAL  Final   Special Requests POF PRIMAXIN AND VANCOMYCIN  Final   Gram Stain   Final    RARE WBC PRESENT,BOTH PMN AND MONONUCLEAR MODERATE GRAM NEGATIVE RODS RARE GRAM POSITIVE RODS RARE GRAM POSITIVE COCCI IN PAIRS AND CHAINS    Culture   Final    MODERATE ESCHERICHIA COLI FEW ACINETOBACTER CALCOACETICUS/BAUMANNII COMPLEX MULTI-DRUG RESISTANT ORGANISM MODERATE BACTEROIDES CACCAE BETA LACTAMASE POSITIVE    Report Status 02/13/2016 FINAL  Final   Organism ID, Bacteria ESCHERICHIA COLI  Final   Organism ID, Bacteria ACINETOBACTER CALCOACETICUS/BAUMANNII COMPLEX  Final      Susceptibility   Acinetobacter calcoaceticus/baumannii complex - MIC*    CEFTAZIDIME >=64 RESISTANT Resistant     CEFTRIAXONE >=64 RESISTANT Resistant     CIPROFLOXACIN >=4 RESISTANT Resistant     GENTAMICIN 8 INTERMEDIATE Intermediate     IMIPENEM >=16 RESISTANT Resistant     PIP/TAZO >=128 RESISTANT Resistant     TRIMETH/SULFA >=320 RESISTANT Resistant     CEFEPIME >=64 RESISTANT Resistant     AMPICILLIN/SULBACTAM 16 RESISTANT Resistant     * FEW ACINETOBACTER CALCOACETICUS/BAUMANNII COMPLEX   Escherichia coli - MIC*    AMPICILLIN >=32 RESISTANT Resistant     CEFAZOLIN <=4 SENSITIVE Sensitive     CEFEPIME <=1  SENSITIVE Sensitive     CEFTAZIDIME <=1 SENSITIVE Sensitive     CEFTRIAXONE <=1 SENSITIVE Sensitive     CIPROFLOXACIN <=0.25 SENSITIVE Sensitive     GENTAMICIN <=1 SENSITIVE Sensitive     IMIPENEM <=0.25 SENSITIVE Sensitive     TRIMETH/SULFA <=20 SENSITIVE Sensitive     AMPICILLIN/SULBACTAM >=32 RESISTANT Resistant     PIP/TAZO <=4 SENSITIVE Sensitive     Extended ESBL NEGATIVE Sensitive     * MODERATE ESCHERICHIA COLI  Urine culture     Status: None   Collection Time: 02/08/16  1:03 PM  Result Value Ref Range Status   Specimen Description URINE, RANDOM  Final   Special Requests NONE  Final   Culture NO GROWTH  Final   Report Status 02/10/2016 FINAL  Final  MRSA PCR  Screening     Status: None   Collection Time: 02/08/16  1:15 PM  Result Value Ref Range Status   MRSA by PCR NEGATIVE NEGATIVE Final    Comment:        The GeneXpert MRSA Assay (FDA approved for NASAL specimens only), is one component of a comprehensive MRSA colonization surveillance program. It is not intended to diagnose MRSA infection nor to guide or monitor treatment for MRSA infections.     Impression/Plan:  1. intraabonminal abscess - on appropriate antibiotics and improving. Continue tigecycline and tobra through November 4th and stop 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

## 2016-02-16 NOTE — Progress Notes (Signed)
PROGRESS NOTE    [name]  [id number] DOB: [birth year] DOA: 02/08/2016 PCP: No primary care provider on file.   Brief Narrative:  50 year old WM PMHx Asthma, Small Bowel Perforation on 01/27/16 s/p resection with subsequent anastomotic leak w/ wound dehisence. He was d/c on 02/02/16.  He required re-admit as he developed septic shock, ischemic bowel, and an abscess.  He was taken to the OR on 02/08/16 for abscess drainage, and ileocecum removal.   Subjective: 11/2  A/O 4, mild abdominal pain consistent with recent surgery. Sitting in chair.   Assessment & Plan:   Active Problems:   Abdominal infection (HCC)   Peritonitis ([place])   Sepsis ([place])   Sepsis due to Escherichia coli [facility])   Ischemic bowel disease ([place])   Bowel perforation (HCC)   Abdominal abscess (HCC)   Pleural effusion   Acute kidney injury ([place])   Ischemic bowel - perf at anastomosis site - abdominal abscess - s/p ileostomy -Ongoing care per general surgery  - antibiotic management per ID  L Pleural Effusion  -Small on f/u CXR  - follow w/o intervention for now   Septic shock Resolved  Lower extremity edema resolving  Hyopkalemia -Potassium goal > 4 -Potassium IV 50 mEq -Recheck K/Mg/PO4 , WNL will need to monitor closely secondary to high output ostomy.    AKI -Resolved  Lab Results  Component Value Date   CREATININE 0.53 (L) 02/16/2016   CREATININE 0.49 (L) 02/15/2016   CREATININE 0.45 (L) 02/14/2016   Severe protein cal malnutrition  -Advance diet as per general surgery  Normocytic Anemia -No evidence of active bleeding  Recent Labs Lab 02/13/16 0601 02/14/16 0345 02/14/16 1736 02/15/16 0250 02/16/16 0420  HGB 10.0* 7.7* 7.6* 7.7* 7.7*  -Transfused for Hg <7   DVT prophylaxis: Lovenox Code Status: Full Family Communication: None Disposition Plan: Per surgery   Consultants:  Dr. Lavon Paganini Feinstein,PCCM Dr Okey Regal Comer. ID  Dr.Matthew Tsuei, Gen  Surgery   Procedures/Significant Events:  10/25 - readmitted - Intubated and central access obtained - Exploratory surgery, wound vac placement and ileocectomy  10/27 - Exploratory surgery-peritonitis, distal ileum ischemia , s/p resection w/ ileostomy  10/29 - Exploratory laparotomy with removal of vacuum pack dressing and closure of abdominal wall  10/30 - Extubated 11/1 - [facility] assumed care    Cultures     Antimicrobials: 10/25 vanc > 10/30 10/25 imipenem > 10/30 10/30 ceftriaxone  > stop 11/3 10/25 anidulafungin > stop 11/2 10/30 tobramycin   Devices    LINES / TUBES:      Continuous Infusions:    Objective: Vitals:   02/16/16 1557 02/16/16 1600 02/16/16 1700 02/16/16 1800  BP:  116/73 114/77 113/61  Pulse:  75 70 97  Resp:  19 15 (!) 24  Temp: 98.3 F (36.8 C)     TempSrc: Oral     SpO2:  100% 100% 100%  Weight:      Height:        Intake/Output Summary (Last 24 hours) at 02/16/16 1925 Last data filed at 02/16/16 1800  Gross per 24 hour  Intake          1592.75 ml  Output             1775 ml  Net          -182.25 ml   Filed Weights   02/13/16 0350 02/14/16 0400 02/16/16 0400  Weight: 82.6 kg (182 lb 1.6 oz) 72.9 kg (160  lb 11.5 oz) 55.9 kg (123 lb 3.8 oz)    Examination:  General: A/O 4, NAD, cachectic, No acute respiratory distress Eyes: negative scleral hemorrhage, negative anisocoria, negative icterus ENT: Negative Runny nose, negative gingival bleeding, Neck:  Negative scars, masses, torticollis, lymphadenopathy, JVD Lungs: Clear to auscultation bilaterally without wheezes or crackles Cardiovascular: Regular rate and rhythm without murmur gallop or rub normal S1 and S2 Abdomen: Positive abdominal pain (appropriate to recent exploratory laparoscopy), nondistended, positive soft, bowel sounds, no rebound, no ascites, no appreciable mass, vertical midline incision covered and clean, sutures in place negative sign of infection. RLQ ostomy in  place high output thin liquid Extremities: No significant cyanosis, clubbing, or edema bilateral lower extremities Skin: Negative rashes, lesions, ulcers Psychiatric:  Negative depression, negative anxiety, negative fatigue, negative mania  Central nervous system:  Cranial nerves II through XII intact, tongue/uvula midline, all extremities muscle strength 5/5, sensation intact throughout, finger nose finger bilateral within normal limits, quick finger touch bilateral within normal limits, negative dysarthria, negative expressive aphasia, negative receptive aphasia. .     Data Reviewed: Care during the described time interval was provided by me .  I have reviewed this patient's available data, including medical history, events of note, physical examination, and all test results as part of my evaluation. I have personally reviewed and interpreted all radiology studies.  CBC:  Recent Labs Lab 02/10/16 0402  02/13/16 0601 02/14/16 0345 02/14/16 1736 02/15/16 0250 02/16/16 0420  WBC 31.7*  < > 13.7* 18.1* 16.5* 16.6* 15.1*  NEUTROABS 28.2*  --  10.0*  --  13.0*  --   --   HGB 7.5*  < > 10.0* 7.7* 7.6* 7.7* 7.7*  HCT 22.2*  < > 29.6* 22.5* 22.7* 22.3* 22.7*  MCV 81.0  < > 80.9 79.5 80.2 79.9 79.9  PLT 390  < > 248 295 357 390 494*  < > = values in this interval not displayed. Basic Metabolic Panel:  Recent Labs Lab 02/11/16 0406 02/12/16 0555  02/13/16 0601 02/14/16 0345 02/14/16 1530 02/15/16 0250 02/16/16 0420 02/16/16 1632  NA 134* 135  < > 131* 134* 135 136 136  --   K 3.8 2.9*  < > 3.2* 2.6* 3.5 3.3* 3.2* 3.8  CL 105 104  < > 97* 96* 98* 97* 100*  --   CO2 25 27  < > 28 32 31 31 28   --   GLUCOSE 108* 99  < > 122* 129* 112* 87 80  --   BUN 10 5*  < > 5* 8 9 13 20   --   CREATININE 0.55* 0.33*  < > 0.41* 0.44* 0.45* 0.49* 0.53*  --   CALCIUM 7.0* 6.9*  < > 6.8* 7.3* 7.7* 7.6* 7.4*  --   MG 2.1 1.7  --  1.7 2.0  --   --   --  2.0  PHOS 1.8* 2.7  --  3.2 3.2  --   --   --   3.8  < > = values in this interval not displayed. GFR: Estimated Creatinine Clearance: 88.3 mL/min (by C-G formula based on SCr of 0.53 mg/dL (L)). Liver Function Tests:  Recent Labs Lab 02/10/16 0402 02/13/16 0601 02/16/16 0420  AST 23 24 25   ALT 17 17 20   ALKPHOS 114 139* 141*  BILITOT 0.3 0.5 0.4  PROT 3.9* 3.8* 4.9*  ALBUMIN 1.1* 1.1* 1.5*   No results for input(s): LIPASE, AMYLASE in the last 168 hours. No results for input(s):  AMMONIA in the last 168 hours. Coagulation Profile:  Recent Labs Lab 02/10/16 0800  INR 1.44   Cardiac Enzymes: No results for input(s): CKTOTAL, CKMB, CKMBINDEX, TROPONINI in the last 168 hours. BNP (last 3 results) No results for input(s): PROBNP in the last 8760 hours. HbA1C: No results for input(s): HGBA1C in the last 72 hours. CBG:  Recent Labs Lab 02/15/16 0451 02/15/16 0828 02/15/16 1122 02/15/16 1554 02/16/16 0728  GLUCAP 82 77 80 107* 79   Lipid Profile: No results for input(s): CHOL, HDL, LDLCALC, TRIG, CHOLHDL, LDLDIRECT in the last 72 hours. Thyroid Function Tests: No results for input(s): TSH, T4TOTAL, FREET4, T3FREE, THYROIDAB in the last 72 hours. Anemia Panel: No results for input(s): VITAMINB12, FOLATE, FERRITIN, TIBC, IRON, RETICCTPCT in the last 72 hours. Urine analysis:    Component Value Date/Time   COLORURINE AMBER (A) 02/08/2016 1303   APPEARANCEUR CLOUDY (A) 02/08/2016 1303   LABSPEC >1.046 (H) 02/08/2016 1303   PHURINE 6.0 02/08/2016 1303   GLUCOSEU NEGATIVE 02/08/2016 1303   HGBUR TRACE (A) 02/08/2016 1303   BILIRUBINUR NEGATIVE 02/08/2016 1303   KETONESUR NEGATIVE 02/08/2016 1303   PROTEINUR 30 (A) 02/08/2016 1303   UROBILINOGEN 1.0 07/28/2007 1539   NITRITE NEGATIVE 02/08/2016 1303   LEUKOCYTESUR NEGATIVE 02/08/2016 1303   Sepsis Labs: @LABRCNTIP (procalcitonin:4,lacticidven:4)  ) Recent Results (from the past 240 hour(s))  Blood Culture (routine x 2)     Status: None   Collection Time:  02/08/16  4:43 AM  Result Value Ref Range Status   Specimen Description BLOOD LEFT HAND  Final   Special Requests IN PEDIATRIC BOTTLE 3ML  Final   Culture NO GROWTH 5 DAYS  Final   Report Status 02/13/2016 FINAL  Final  Blood Culture (routine x 2)     Status: None   Collection Time: 02/08/16  5:00 AM  Result Value Ref Range Status   Specimen Description BLOOD RIGHT FOREARM  Final   Special Requests IN PEDIATRIC BOTTLE 2ML  Final   Culture NO GROWTH 5 DAYS  Final   Report Status 02/13/2016 FINAL  Final  Aerobic/Anaerobic Culture (surgical/deep wound)     Status: None   Collection Time: 02/08/16 11:55 AM  Result Value Ref Range Status   Specimen Description PERITONEAL  Final   Special Requests POF PRIMAXIN AND VANCOMYCIN  Final   Gram Stain   Final    RARE WBC PRESENT,BOTH PMN AND MONONUCLEAR MODERATE GRAM NEGATIVE RODS RARE GRAM POSITIVE RODS RARE GRAM POSITIVE COCCI IN PAIRS AND CHAINS    Culture   Final    MODERATE ESCHERICHIA COLI FEW ACINETOBACTER CALCOACETICUS/BAUMANNII COMPLEX MULTI-DRUG RESISTANT ORGANISM MODERATE BACTEROIDES CACCAE BETA LACTAMASE POSITIVE    Report Status 02/13/2016 FINAL  Final   Organism ID, Bacteria ESCHERICHIA COLI  Final   Organism ID, Bacteria ACINETOBACTER CALCOACETICUS/BAUMANNII COMPLEX  Final      Susceptibility   Acinetobacter calcoaceticus/baumannii complex - MIC*    CEFTAZIDIME >=64 RESISTANT Resistant     CEFTRIAXONE >=64 RESISTANT Resistant     CIPROFLOXACIN >=4 RESISTANT Resistant     GENTAMICIN 8 INTERMEDIATE Intermediate     IMIPENEM >=16 RESISTANT Resistant     PIP/TAZO >=128 RESISTANT Resistant     TRIMETH/SULFA >=320 RESISTANT Resistant     CEFEPIME >=64 RESISTANT Resistant     AMPICILLIN/SULBACTAM 16 RESISTANT Resistant     * FEW ACINETOBACTER CALCOACETICUS/BAUMANNII COMPLEX   Escherichia coli - MIC*    AMPICILLIN >=32 RESISTANT Resistant     CEFAZOLIN <=4 SENSITIVE Sensitive  CEFEPIME <=1 SENSITIVE Sensitive      CEFTAZIDIME <=1 SENSITIVE Sensitive     CEFTRIAXONE <=1 SENSITIVE Sensitive     CIPROFLOXACIN <=0.25 SENSITIVE Sensitive     GENTAMICIN <=1 SENSITIVE Sensitive     IMIPENEM <=0.25 SENSITIVE Sensitive     TRIMETH/SULFA <=20 SENSITIVE Sensitive     AMPICILLIN/SULBACTAM >=32 RESISTANT Resistant     PIP/TAZO <=4 SENSITIVE Sensitive     Extended ESBL NEGATIVE Sensitive     * MODERATE ESCHERICHIA COLI  Urine culture     Status: None   Collection Time: 02/08/16  1:03 PM  Result Value Ref Range Status   Specimen Description URINE, RANDOM  Final   Special Requests NONE  Final   Culture NO GROWTH  Final   Report Status 02/10/2016 FINAL  Final  MRSA PCR Screening     Status: None   Collection Time: 02/08/16  1:15 PM  Result Value Ref Range Status   MRSA by PCR NEGATIVE NEGATIVE Final    Comment:        The GeneXpert MRSA Assay (FDA approved for NASAL specimens only), is one component of a comprehensive MRSA colonization surveillance program. It is not intended to diagnose MRSA infection nor to guide or monitor treatment for MRSA infections.          Radiology Studies: Dg Chest Port 1 View  Result Date: 02/15/2016 CLINICAL DATA:  Acute respiratory failure.  Pleural effusion. EXAM: PORTABLE CHEST 1 VIEW COMPARISON:  02/13/2016 FINDINGS: Enteric tube, endotracheal tube, and right jugular catheter have been removed. Right PICC remains in place with tip projecting over the cavoatrial junction/ high right atrium. The cardiomediastinal silhouette is within normal limits. Left greater than right basilar lung opacities appear minimally improved compared to the prior study. A small left pleural effusion is suspected. No pneumothorax. IMPRESSION: Minimally improved left greater than right basilar atelectasis or infiltrates. Electronically Signed   By: Logan Bores M.D.   On: 02/15/2016 07:13        Scheduled Meds: . enoxaparin (LOVENOX) injection  40 mg Subcutaneous Q24H  . feeding  supplement  1 Container Oral TID BM  . loperamide  4 mg Oral QID  . pantoprazole  40 mg Oral BID AC  . tigecycline (TYGACIL) IVPB  50 mg Intravenous Q12H  . tobramycin  7 mg/kg Intravenous Q24H   Continuous Infusions:    LOS: 8 days    Time spent: 40 minutes    [name], Geraldo Docker, MD [facility] Pager [phone]   If 7PM-7AM, please contact night-coverage [email or web address] Password [email or web address] 02/16/2016, 7:25 PM

## 2016-02-16 NOTE — Consult Note (Signed)
[place] contacted patients wife and set up a time for ostomy teaching tom. 02/17/16 at 930am.  Kunta Hilleary MSN, RN, Letha Cape, CNS

## 2016-02-16 NOTE — Progress Notes (Signed)
4 Days Post-Op  Subjective: Patient resting comfortably, drinking coffee No complaints "I feel fantastic" Awaiting step-down bed Ileostomy output increased slightly, despite Imodium  Objective: Vital signs in last 24 hours: Temp:  [97.7 F (36.5 C)-99.3 F (37.4 C)] 98.3 F (36.8 C) (11/02 0732) Pulse Rate:  [63-87] 71 (11/02 0700) Resp:  [18-30] 18 (11/02 0700) BP: (90-123)/(46-90) 107/62 (11/02 0700) SpO2:  [93 %-100 %] 98 % (11/02 0700) Weight:  [55.9 kg (123 lb 3.8 oz)] 55.9 kg (123 lb 3.8 oz) (11/02 0400) Last BM Date: 02/15/16  Intake/Output from previous day: 11/01 0701 - 11/02 0700 In: 1102.8 [P.O.:500; I.V.:110; IV Piggyback:442.8] Out: [id number] [Urine:2050; [id number] Intake/Output this shift: No intake/output data recorded.  General appearance: alert, cooperative and no distress Resp: clear to auscultation bilaterally Cardio: regular rate and rhythm, S1, S2 normal, no murmur, click, rub or gallop GI: soft, incisional tenderness; pink ileostomy with bilious output Incision - loosely closed with Ethibond; gaps are clean with minimal serous drainage  Lab Results:   Recent Labs  02/15/16 0250 02/16/16 0420  WBC 16.6* 15.1*  HGB 7.7* 7.7*  HCT 22.3* 22.7*  PLT 390 494*   BMET  Recent Labs  02/15/16 0250 02/16/16 0420  NA 136 136  K 3.3* 3.2*  CL 97* 100*  CO2 31 28  GLUCOSE 87 80  BUN 13 20  CREATININE 0.49* 0.53*  CALCIUM 7.6* 7.4*   PT/INR No results for input(s): LABPROT, INR in the last 72 hours. ABG No results for input(s): PHART, HCO3 in the last 72 hours.  Invalid input(s): PCO2, PO2  Studies/Results: Dg Chest Port 1 View  Result Date: 02/15/2016 CLINICAL DATA:  Acute respiratory failure.  Pleural effusion. EXAM: PORTABLE CHEST 1 VIEW COMPARISON:  02/13/2016 FINDINGS: Enteric tube, endotracheal tube, and right jugular catheter have been removed. Right PICC remains in place with tip projecting over the cavoatrial junction/ high right  atrium. The cardiomediastinal silhouette is within normal limits. Left greater than right basilar lung opacities appear minimally improved compared to the prior study. A small left pleural effusion is suspected. No pneumothorax. IMPRESSION: Minimally improved left greater than right basilar atelectasis or infiltrates. Electronically Signed   By: Logan Bores M.D.   On: 02/15/2016 07:13    Anti-infectives: Anti-infectives    Start     Dose/Rate Route Frequency Ordered Stop   02/15/16 0031  tigecycline (TYGACIL) 50 mg in sodium chloride 0.9 % 100 mL IVPB     50 mg 200 mL/hr over 30 Minutes Intravenous Every 12 hours 02/14/16 1232 02/18/16 2359   02/14/16 1400  tobramycin (NEBCIN) 510 mg in dextrose 5 % 100 mL IVPB     7 mg/kg  72.9 kg 112.8 mL/hr over 60 Minutes Intravenous Every 24 hours 02/14/16 1241 02/18/16 2359   02/14/16 1245  tigecycline (TYGACIL) 100 mg in sodium chloride 0.9 % 100 mL IVPB     100 mg 200 mL/hr over 30 Minutes Intravenous  Once 02/14/16 1232 02/14/16 1342   02/13/16 1200  cefTRIAXone (ROCEPHIN) 2 g in dextrose 5 % 50 mL IVPB  Status:  Discontinued     2 g 100 mL/hr over 30 Minutes Intravenous Every 24 hours 02/13/16 0857 02/14/16 1205   02/13/16 1200  tobramycin (NEBCIN) 530 mg in dextrose 5 % 100 mL IVPB  Status:  Discontinued     7 mg/kg  75.5 kg (Adjusted) 113.3 mL/hr over 60 Minutes Intravenous Every 24 hours 02/13/16 1123 02/14/16 1234   02/10/16 1500  anidulafungin (ERAXIS)  100 mg in sodium chloride 0.9 % 100 mL IVPB     100 mg over 90 Minutes Intravenous Every 24 hours 02/09/16 0804 02/15/16 1556   02/10/16 1500  vancomycin (VANCOCIN) IVPB 1000 mg/200 mL premix  Status:  Discontinued     1,000 mg 200 mL/hr over 60 Minutes Intravenous Every 8 hours 02/10/16 1239 02/13/16 0856   02/10/16 1500  imipenem-cilastatin (PRIMAXIN) 1,000 mg in sodium chloride 0.9 % 250 mL IVPB  Status:  Discontinued     1,000 mg 250 mL/hr over 60 Minutes Intravenous Every 8 hours  02/10/16 1239 02/13/16 0856   02/09/16 1000  imipenem-cilastatin (PRIMAXIN) 1,000 mg in sodium chloride 0.9 % 250 mL IVPB  Status:  Discontinued     1,000 mg 250 mL/hr over 60 Minutes Intravenous Every 8 hours 02/09/16 0812 02/10/16 1239   02/09/16 1000  vancomycin (VANCOCIN) IVPB 1000 mg/200 mL premix  Status:  Discontinued     1,000 mg 200 mL/hr over 60 Minutes Intravenous Every 8 hours 02/09/16 0812 02/10/16 1239   02/09/16 0900  anidulafungin (ERAXIS) 200 mg in sodium chloride 0.9 % 200 mL IVPB     200 mg over 180 Minutes Intravenous  Once 02/09/16 0804 02/09/16 1209   02/09/16 0830  anidulafungin (ERAXIS) 100 mg in sodium chloride 0.9 % 100 mL IVPB  Status:  Discontinued     100 mg over 90 Minutes Intravenous Every 24 hours 02/08/16 0811 02/09/16 0804   02/08/16 1800  vancomycin (VANCOCIN) IVPB 750 mg/150 ml premix  Status:  Discontinued     750 mg 150 mL/hr over 60 Minutes Intravenous Every 8 hours 02/08/16 0847 02/09/16 0812   02/08/16 1400  piperacillin-tazobactam (ZOSYN) IVPB 3.375 g  Status:  Discontinued     3.375 g 12.5 mL/hr over 240 Minutes Intravenous Every 8 hours 02/08/16 0432 02/08/16 0802   02/08/16 0900  imipenem-cilastatin (PRIMAXIN) 500 mg in sodium chloride 0.9 % 100 mL IVPB  Status:  Discontinued     500 mg 200 mL/hr over 30 Minutes Intravenous Every 6 hours 02/08/16 0818 02/09/16 0812   02/08/16 0830  ertapenem (INVANZ) 1 g in sodium chloride 0.9 % 50 mL IVPB  Status:  Discontinued     1 g 100 mL/hr over 30 Minutes Intravenous Every 24 hours 02/08/16 0756 02/08/16 0812   02/08/16 0830  anidulafungin (ERAXIS) 200 mg in sodium chloride 0.9 % 200 mL IVPB  Status:  Discontinued     200 mg over 180 Minutes Intravenous  Once 02/08/16 0811 02/09/16 1457   02/08/16 0830  vancomycin (VANCOCIN) 1,500 mg in sodium chloride 0.9 % 500 mL IVPB  Status:  Discontinued     1,500 mg 250 mL/hr over 120 Minutes Intravenous  Once 02/08/16 0815 02/09/16 1355   02/08/16 0815   vancomycin (VANCOCIN) 1,500 mg in sodium chloride 0.9 % 500 mL IVPB  Status:  Discontinued     1,500 mg 250 mL/hr over 120 Minutes Intravenous Every 12 hours 02/08/16 0811 02/08/16 0812   02/08/16 0430  piperacillin-tazobactam (ZOSYN) IVPB 3.375 g     3.375 g 100 mL/hr over 30 Minutes Intravenous  Once 02/08/16 0419 02/08/16 0532      Assessment/Plan: Crohns disease s/p ileocecectomy 01/27/16 for SB perforation Anastomotic leak/ peritoneal abscess - s/p exp lap/ resection of anastomosis 02/08/16 Washout/ VAC change 02/10/16 Washout/ fascial closure 02/12/16 Acute blood loss anemia - stable WBC - decreasing  Improving Ostomy pink  Advance diet as tolerated No plans for return to OR  Wet to dry dressings to midline wound Will need to keep up with fluid loss from ileostomy output - on Imodium at max dose; if no improvement, may need to add Paregoric  LOS: 8 days    Roger Berrett K. 02/16/2016

## 2016-02-16 NOTE — Progress Notes (Signed)
Speech Language Pathology Treatment: Dysphagia  Patient Details Name: [name] MRN: [id number] DOB: 1965/04/26 Today's Date: 02/16/2016 Time: [id number] SLP Time Calculation (min) (ACUTE ONLY): 19 min  Assessment / Plan / Recommendation Clinical Impression  Pt was very pleasant and eager for PO trials. He had a baseline cough prior to intake. Immediate coughing occurred following regular solid trials with thin liquid washes. Mild oral suctioning needed to clear regurgitation. Given pureed and soft solids, pt had no overt s/s of aspiration. Recommend Dys 2 diet and thin liquids. Will f/u for diet tolerance and advancement.   HPI HPI: 50 year old white male with a past medical history of small bowel perforation on 01/27/16 s/p resection with subsequent wound dehisence. He was d/c on 02/02/16. Developed septic shock, ischemic bowel, abscess. Pt was taken to the OR on 02/08/16 for exploratory surgery. Drained abscess, removed ileocecum. Pt intubated on 10/25 and extubated on 10/30.      SLP Plan  Continue with current plan of care     Recommendations  Diet recommendations: Dysphagia 2 (fine chop);Thin liquid Liquids provided via: Straw;Cup Medication Administration: Whole meds with puree Supervision: Intermittent supervision to cue for compensatory strategies;Patient able to self feed Compensations: Minimize environmental distractions;Slow rate;Small sips/bites Postural Changes and/or Swallow Maneuvers: Seated upright 90 degrees;Upright 30-60 min after meal                Oral Care Recommendations: Oral care BID Follow up Recommendations: Other (comment) (tba) Plan: Continue with current plan of care       [place], Student SLP  Shela Leff 02/16/2016, 1:20 PM

## 2016-02-17 LAB — COMPREHENSIVE METABOLIC PANEL
ALBUMIN: 1.6 g/dL — AB (ref 3.5–5.0)
ALT: 22 U/L (ref 17–63)
ANION GAP: 6 (ref 5–15)
AST: 22 U/L (ref 15–41)
Alkaline Phosphatase: 137 U/L — ABNORMAL HIGH (ref 38–126)
BILIRUBIN TOTAL: 0.6 mg/dL (ref 0.3–1.2)
BUN: 20 mg/dL (ref 6–20)
CO2: 26 mmol/L (ref 22–32)
Calcium: 7.6 mg/dL — ABNORMAL LOW (ref 8.9–10.3)
Chloride: 103 mmol/L (ref 101–111)
Creatinine, Ser: 0.54 mg/dL — ABNORMAL LOW (ref 0.61–1.24)
GFR calc Af Amer: >60 mL/min (ref >60)
Glucose, Bld: 83 mg/dL (ref 65–99)
POTASSIUM: 3.7 mmol/L (ref 3.5–5.1)
Sodium: 135 mmol/L (ref 135–145)
TOTAL PROTEIN: 5 g/dL — AB (ref 6.5–8.1)

## 2016-02-17 LAB — CBC
HCT: 24.5 % — ABNORMAL LOW (ref 39.0–52.0)
Hemoglobin: 8.2 g/dL — ABNORMAL LOW (ref 13.0–17.0)
MCH: 26.8 pg (ref 26.0–34.0)
MCHC: 33.5 g/dL (ref 30.0–36.0)
MCV: 80.1 fL (ref 78.0–100.0)
PLATELETS: 574 10*3/uL — AB (ref 150–400)
RBC: 3.06 MIL/uL — AB (ref 4.22–5.81)
RDW: 15.7 % — ABNORMAL HIGH (ref 11.5–15.5)
WBC: 16.5 10*3/uL — AB (ref 4.0–10.5)

## 2016-02-17 LAB — PHOSPHORUS: PHOSPHORUS: 3.4 mg/dL (ref 2.5–4.6)

## 2016-02-17 LAB — MAGNESIUM: MAGNESIUM: 2.1 mg/dL (ref 1.7–2.4)

## 2016-02-17 MED ORDER — POTASSIUM CHLORIDE CRYS ER 20 MEQ PO TBCR
40.0000 meq | EXTENDED_RELEASE_TABLET | Freq: Once | ORAL | Status: AC
  Administered 2016-02-17: 40 meq via ORAL
  Filled 2016-02-17: qty 2

## 2016-02-17 NOTE — Consult Note (Signed)
[place] Nurse ostomy follow up Stoma type/location:  RLQ, end ileostomy Stomal assessment/size: 1 5/8" round, budded, pink, moist Peristomal assessment: intact Has one area MARSI that is now dry and healing Treatment options for stomal/peristomal skin: none Output liquid green Ostomy pouching: 2pc. 2 3/4" Education provided:  Patient emptied pouch for [place] with minimal cueing Demonstrated using toilet paper wick to clean spout prior to closure Demonstrated measuring stoma and cutting wafer to patient and wife Demonstrated cleaning peristomal skin and applying new pouch.  Will send extra pouches to patients room for patient and wife to practice closure and attachment of 2pc pouch.  Enrolled patient in [place] Start Discharge program: Yes Will send patient home with 4 pouches and 4 wafers Educational materials given to patient and wife Provided [place] nurse contact information.  [facility]ene[facility] MSN, [place], [place], [place]

## 2016-02-17 NOTE — Progress Notes (Signed)
PROGRESS NOTE    [name]  [id number] DOB: [birth year] DOA: 02/08/2016 PCP: No primary care provider on file.   Chief Complaint  Patient presents with  . Abdominal Pain    Brief Narrative:  HPI on 02/08/2016 by Ms. [name], PA (Surgery) 50 y/o male with PMH back pain and small bowel perforation s/p ileocecectomy 01/28/16 who presented to [facility] with abdominal pain. He was previously discharged from hospital on 02/02/16. Patient states his abdominal pain started late last night/early this morning and is described as sharp and non-radiating. Rates pain as 10/10. Pain is worse with movement and inspiration. No relief with PO pain medication. Associated symptoms include nausea, chills, and SOB. Patient has been having daily BMs. Denies fever, CP, vomiting, diarrhea, melena, hematochezia, urinary symptoms, and weakness. He denies regular medication use.  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  DVT Prophylaxis  Lovenox  Code Status: Full  Family Communication: None at bedside  Disposition Plan: Admitted. Discharge pending surgery recommendations. Antibiotics through 11/6  Consultants PCCM General surgery Infectious disease  Procedures  10/25 - readmitted - Intubated and central access obtained - Exploratory surgery, wound vac placement and ileocectomy  10/27 -  Exploratory surgery-peritonitis, distal ileum ischemia , s/p resection w/ ileostomy  10/29 - Exploratory laparotomy with removal of vacuum pack dressing and closure of abdominal wall  10/30 - Extubated 11/1 - [facility] assumed care  Antibiotics   Anti-infectives    Start     Dose/Rate Route Frequency Ordered Stop   02/15/16 0031  tigecycline (TYGACIL) 50 mg in sodium chloride 0.9 % 100 mL IVPB     50 mg 200 mL/hr over 30 Minutes Intravenous Every 12 hours 02/14/16 1232 02/18/16 2359   02/14/16 1400  tobramycin (NEBCIN) 510 mg in dextrose 5 % 100 mL IVPB     7 mg/kg  72.9 kg 112.8 mL/hr over 60 Minutes Intravenous Every 24 hours 02/14/16 1241 02/18/16 2359   02/14/16 1245  tigecycline (TYGACIL) 100 mg in sodium chloride 0.9 % 100 mL IVPB     100 mg 200 mL/hr over 30 Minutes Intravenous  Once 02/14/16 1232 02/14/16 1342   02/13/16 1200  cefTRIAXone (ROCEPHIN) 2 g in dextrose 5 % 50 mL IVPB  Status:  Discontinued     2 g 100 mL/hr over 30 Minutes Intravenous Every 24 hours 02/13/16 0857 02/14/16 1205   02/13/16 1200  tobramycin (NEBCIN) 530 mg in dextrose 5 % 100 mL IVPB  Status:  Discontinued     7 mg/kg  75.5 kg (Adjusted) 113.3 mL/hr over 60 Minutes Intravenous Every 24 hours 02/13/16 1123 02/14/16 1234   02/10/16 1500  anidulafungin (ERAXIS) 100 mg in sodium chloride 0.9 % 100 mL IVPB     100 mg over 90 Minutes Intravenous Every 24 hours 02/09/16 0804 02/15/16 1556   02/10/16 1500  vancomycin (VANCOCIN) IVPB 1000 mg/200 mL premix  Status:  Discontinued  1,000 mg 200 mL/hr over 60 Minutes Intravenous Every 8 hours 02/10/16 1239 02/13/16 0856   02/10/16 1500  imipenem-cilastatin (PRIMAXIN) 1,000 mg in sodium chloride 0.9 % 250 mL IVPB  Status:  Discontinued     1,000 mg 250 mL/hr over 60 Minutes Intravenous Every 8 hours 02/10/16 1239 02/13/16 0856   02/09/16 1000  imipenem-cilastatin (PRIMAXIN) 1,000 mg in sodium chloride 0.9 % 250 mL IVPB  Status:  Discontinued     1,000 mg 250  mL/hr over 60 Minutes Intravenous Every 8 hours 02/09/16 0812 02/10/16 1239   02/09/16 1000  vancomycin (VANCOCIN) IVPB 1000 mg/200 mL premix  Status:  Discontinued     1,000 mg 200 mL/hr over 60 Minutes Intravenous Every 8 hours 02/09/16 0812 02/10/16 1239   02/09/16 0900  anidulafungin (ERAXIS) 200 mg in sodium chloride 0.9 % 200 mL IVPB     200 mg over 180 Minutes Intravenous  Once 02/09/16 0804 02/09/16 1209   02/09/16 0830  anidulafungin (ERAXIS) 100 mg in sodium chloride 0.9 % 100 mL IVPB  Status:  Discontinued     100 mg over 90 Minutes Intravenous Every 24 hours 02/08/16 0811 02/09/16 0804   02/08/16 1800  vancomycin (VANCOCIN) IVPB 750 mg/150 ml premix  Status:  Discontinued     750 mg 150 mL/hr over 60 Minutes Intravenous Every 8 hours 02/08/16 0847 02/09/16 0812   02/08/16 1400  piperacillin-tazobactam (ZOSYN) IVPB 3.375 g  Status:  Discontinued     3.375 g 12.5 mL/hr over 240 Minutes Intravenous Every 8 hours 02/08/16 0432 02/08/16 0802   02/08/16 0900  imipenem-cilastatin (PRIMAXIN) 500 mg in sodium chloride 0.9 % 100 mL IVPB  Status:  Discontinued     500 mg 200 mL/hr over 30 Minutes Intravenous Every 6 hours 02/08/16 0818 02/09/16 0812   02/08/16 0830  ertapenem (INVANZ) 1 g in sodium chloride 0.9 % 50 mL IVPB  Status:  Discontinued     1 g 100 mL/hr over 30 Minutes Intravenous Every 24 hours 02/08/16 0756 02/08/16 0812   02/08/16 0830  anidulafungin (ERAXIS) 200 mg in sodium chloride 0.9 % 200 mL IVPB  Status:  Discontinued     200 mg over 180 Minutes Intravenous  Once 02/08/16 0811 02/09/16 1457   02/08/16 0830  vancomycin (VANCOCIN) 1,500 mg in sodium chloride 0.9 % 500 mL IVPB  Status:  Discontinued     1,500 mg 250 mL/hr over 120 Minutes Intravenous  Once 02/08/16 0815 02/09/16 1355   02/08/16 0815  vancomycin (VANCOCIN) 1,500 mg in sodium chloride 0.9 % 500 mL IVPB  Status:  Discontinued     1,500 mg 250 mL/hr over 120 Minutes Intravenous Every 12 hours 02/08/16  0811 02/08/16 0812   02/08/16 0430  piperacillin-tazobactam (ZOSYN) IVPB 3.375 g     3.375 g 100 mL/hr over 30 Minutes Intravenous  Once 02/08/16 0419 02/08/16 0532      Subjective:   [name] seen and examined today.  Patient very thankful this morning. No complaints.   Has mild abdominal pain but states it is manageable. Denies chest pain, shortness of breath, nausea, vomiting, dizziness, headache.   Objective:   Vitals:   02/16/16 2000 02/16/16 2100 02/17/16 0000 02/17/16 0450  BP: 108/66  110/64 107/61  Pulse: 70  67 79  Resp: (!) 29  (!) 22 (!) 22  Temp:  98.5 F (36.9 C) 98.2 F (36.8 C) 98.1 F (36.7 C)  TempSrc:  Oral Oral Oral  SpO2: 100%  99% 100%  Weight:      Height:        Intake/Output Summary (Last 24 hours) at 02/17/16 1315 Last data filed at 02/17/16 1225  Gross per 24 hour  Intake          1302.75 ml  Output             2025 ml  Net          -722.25 ml   Filed Weights   02/13/16 0350 02/14/16 0400 02/16/16 0400  Weight: 82.6 kg (182 lb 1.6 oz) 72.9 kg (160 lb 11.5 oz) 55.9 kg (123 lb 3.8 oz)    Exam  General: Well developed, well nourished, NAD, appears stated age  33: NCAT,mucous membranes moist.   Cardiovascular: S1 S2 auscultated, no murmurs, RRR  Respiratory: Clear to auscultation bilaterally with equal chest rise  Abdomen: Soft, nontender, nondistended, + bowel sounds, ileostomy pouch with brown liquid   Extremities: warm dry without cyanosis clubbing or edema  Neuro: AAOx3, nonfocal   Psych: Normal affect and demeanor with intact judgement and insight   Data Reviewed: I have personally reviewed following labs and imaging studies  CBC:  Recent Labs Lab 02/13/16 0601 02/14/16 0345 02/14/16 1736 02/15/16 0250 02/16/16 0420 02/17/16 0435  WBC 13.7* 18.1* 16.5* 16.6* 15.1* 16.5*  NEUTROABS 10.0*  --  13.0*  --   --   --   HGB 10.0* 7.7* 7.6* 7.7* 7.7* 8.2*  HCT 29.6* 22.5* 22.7* 22.3* 22.7* 24.5*  MCV 80.9 79.5 80.2  79.9 79.9 80.1  PLT 248 295 357 390 494* [id number]*   Basic Metabolic Panel:  Recent Labs Lab 02/12/16 0555  02/13/16 0601 02/14/16 0345 02/14/16 1530 02/15/16 0250 02/16/16 0420 02/16/16 1632 02/17/16 0435  NA 135  < > 131* 134* 135 136 136  --  135  K 2.9*  < > 3.2* 2.6* 3.5 3.3* 3.2* 3.8 3.7  CL 104  < > 97* 96* 98* 97* 100*  --  103  CO2 27  < > 28 32 31 31 28   --  26  GLUCOSE 99  < > 122* 129* 112* 87 80  --  83  BUN 5*  < > 5* 8 9 13 20   --  20  CREATININE 0.33*  < > 0.41* 0.44* 0.45* 0.49* 0.53*  --  0.54*  CALCIUM 6.9*  < > 6.8* 7.3* 7.7* 7.6* 7.4*  --  7.6*  MG 1.7  --  1.7 2.0  --   --   --  2.0 2.1  PHOS 2.7  --  3.2 3.2  --   --   --  3.8 3.4  < > = values in this interval not displayed. GFR: Estimated Creatinine Clearance: 88.3 mL/min (by C-G formula based on SCr of 0.54 mg/dL (L)). Liver Function Tests:  Recent Labs Lab 02/13/16 0601 02/16/16 0420 02/17/16 0435  AST 24 25 22   ALT 17 20 22   ALKPHOS 139* 141* 137*  BILITOT 0.5 0.4 0.6  PROT 3.8* 4.9* 5.0*  ALBUMIN 1.1* 1.5* 1.6*   No results for input(s): LIPASE, AMYLASE in the last 168 hours. No results for input(s): AMMONIA in the last 168 hours. Coagulation Profile: No results for input(s): INR, PROTIME in the last 168 hours. Cardiac Enzymes: No results for input(s): CKTOTAL, CKMB, CKMBINDEX, TROPONINI in the last 168 hours. BNP (last 3 results) No results for input(s): PROBNP in the last 8760 hours. HbA1C: No results for input(s): HGBA1C in the last  72 hours. CBG:  Recent Labs Lab 02/15/16 0451 02/15/16 0828 02/15/16 1122 02/15/16 1554 02/16/16 0728  GLUCAP 82 77 80 107* 79   Lipid Profile: No results for input(s): CHOL, HDL, LDLCALC, TRIG, CHOLHDL, LDLDIRECT in the last 72 hours. Thyroid Function Tests: No results for input(s): TSH, T4TOTAL, FREET4, T3FREE, THYROIDAB in the last 72 hours. Anemia Panel: No results for input(s): VITAMINB12, FOLATE, FERRITIN, TIBC, IRON, RETICCTPCT in the  last 72 hours. Urine analysis:    Component Value Date/Time   COLORURINE AMBER (A) 02/08/2016 1303   APPEARANCEUR CLOUDY (A) 02/08/2016 1303   LABSPEC >1.046 (H) 02/08/2016 1303   PHURINE 6.0 02/08/2016 1303   GLUCOSEU NEGATIVE 02/08/2016 1303   HGBUR TRACE (A) 02/08/2016 1303   BILIRUBINUR NEGATIVE 02/08/2016 1303   KETONESUR NEGATIVE 02/08/2016 1303   PROTEINUR 30 (A) 02/08/2016 1303   UROBILINOGEN 1.0 07/28/2007 1539   NITRITE NEGATIVE 02/08/2016 1303   LEUKOCYTESUR NEGATIVE 02/08/2016 1303   Sepsis Labs: @LABRCNTIP (procalcitonin:4,lacticidven:4)  ) Recent Results (from the past 240 hour(s))  Blood Culture (routine x 2)     Status: None   Collection Time: 02/08/16  4:43 AM  Result Value Ref Range Status   Specimen Description BLOOD LEFT HAND  Final   Special Requests IN PEDIATRIC BOTTLE 3ML  Final   Culture NO GROWTH 5 DAYS  Final   Report Status 02/13/2016 FINAL  Final  Blood Culture (routine x 2)     Status: None   Collection Time: 02/08/16  5:00 AM  Result Value Ref Range Status   Specimen Description BLOOD RIGHT FOREARM  Final   Special Requests IN PEDIATRIC BOTTLE 2ML  Final   Culture NO GROWTH 5 DAYS  Final   Report Status 02/13/2016 FINAL  Final  Aerobic/Anaerobic Culture (surgical/deep wound)     Status: None   Collection Time: 02/08/16 11:55 AM  Result Value Ref Range Status   Specimen Description PERITONEAL  Final   Special Requests POF PRIMAXIN AND VANCOMYCIN  Final   Gram Stain   Final    RARE WBC PRESENT,BOTH PMN AND MONONUCLEAR MODERATE GRAM NEGATIVE RODS RARE GRAM POSITIVE RODS RARE GRAM POSITIVE COCCI IN PAIRS AND CHAINS    Culture   Final    MODERATE ESCHERICHIA COLI FEW ACINETOBACTER CALCOACETICUS/BAUMANNII COMPLEX MULTI-DRUG RESISTANT ORGANISM MODERATE BACTEROIDES CACCAE BETA LACTAMASE POSITIVE    Report Status 02/13/2016 FINAL  Final   Organism ID, Bacteria ESCHERICHIA COLI  Final   Organism ID, Bacteria ACINETOBACTER  CALCOACETICUS/BAUMANNII COMPLEX  Final      Susceptibility   Acinetobacter calcoaceticus/baumannii complex - MIC*    CEFTAZIDIME >=64 RESISTANT Resistant     CEFTRIAXONE >=64 RESISTANT Resistant     CIPROFLOXACIN >=4 RESISTANT Resistant     GENTAMICIN 8 INTERMEDIATE Intermediate     IMIPENEM >=16 RESISTANT Resistant     PIP/TAZO >=128 RESISTANT Resistant     TRIMETH/SULFA >=320 RESISTANT Resistant     CEFEPIME >=64 RESISTANT Resistant     AMPICILLIN/SULBACTAM 16 RESISTANT Resistant     * FEW ACINETOBACTER CALCOACETICUS/BAUMANNII COMPLEX   Escherichia coli - MIC*    AMPICILLIN >=32 RESISTANT Resistant     CEFAZOLIN <=4 SENSITIVE Sensitive     CEFEPIME <=1 SENSITIVE Sensitive     CEFTAZIDIME <=1 SENSITIVE Sensitive     CEFTRIAXONE <=1 SENSITIVE Sensitive     CIPROFLOXACIN <=0.25 SENSITIVE Sensitive     GENTAMICIN <=1 SENSITIVE Sensitive     IMIPENEM <=0.25 SENSITIVE Sensitive     TRIMETH/SULFA <=20 SENSITIVE Sensitive  AMPICILLIN/SULBACTAM >=32 RESISTANT Resistant     PIP/TAZO <=4 SENSITIVE Sensitive     Extended ESBL NEGATIVE Sensitive     * MODERATE ESCHERICHIA COLI  Urine culture     Status: None   Collection Time: 02/08/16  1:03 PM  Result Value Ref Range Status   Specimen Description URINE, RANDOM  Final   Special Requests NONE  Final   Culture NO GROWTH  Final   Report Status 02/10/2016 FINAL  Final  MRSA PCR Screening     Status: None   Collection Time: 02/08/16  1:15 PM  Result Value Ref Range Status   MRSA by PCR NEGATIVE NEGATIVE Final    Comment:        The GeneXpert MRSA Assay (FDA approved for NASAL specimens only), is one component of a comprehensive MRSA colonization surveillance program. It is not intended to diagnose MRSA infection nor to guide or monitor treatment for MRSA infections.       Radiology Studies: No results found.   Scheduled Meds: . enoxaparin (LOVENOX) injection  40 mg Subcutaneous Q24H  . feeding supplement  1 Container Oral  TID BM  . loperamide  4 mg Oral QID  . pantoprazole  40 mg Oral BID AC  . tigecycline (TYGACIL) IVPB  50 mg Intravenous Q12H  . tobramycin  7 mg/kg Intravenous Q24H   Continuous Infusions:    LOS: 9 days   Time Spent in minutes   30 minutes  Phinneas Shakoor D.O. on 02/17/2016 at 1:15 PM  Between 7am to 7pm - Pager - [phone]  After 7pm go to [email or web address] - password [email or web address]  And look for the night coverage person covering for me after hours  [facility] Office  [phone]

## 2016-02-17 NOTE — Progress Notes (Signed)
[facility] for Infectious Disease   Reason for visit: Follow up on intraabdominal abscess  Interval History: on tigecycline, no worsening abdominal pain, up walking today.  Culture with MDR acinetobacter. No new issues.   Total antibiotic days 10 Day 3/7 tigecycline Day 4 tobramycin   Physical Exam: Constitutional:  Vitals:   02/17/16 0000 02/17/16 0450  BP: 110/64 107/61  Pulse: 67 79  Resp: (!) 22 (!) 22  Temp: 98.2 F (36.8 C) 98.1 F (36.7 C)   patient appears in NAD Eyes: anicteric Respiratory: Normal respiratory effort; CTA B Cardiovascular: RRR GI: + ostomy  Review of Systems: Constitutional: negative for fevers and chills Integument/breast: negative for rash  Lab Results  Component Value Date   WBC 16.5 (H) 02/17/2016   HGB 8.2 (L) 02/17/2016   HCT 24.5 (L) 02/17/2016   MCV 80.1 02/17/2016   PLT 574 (H) 02/17/2016    Lab Results  Component Value Date   CREATININE 0.54 (L) 02/17/2016   BUN 20 02/17/2016   NA 135 02/17/2016   K 3.7 02/17/2016   CL 103 02/17/2016   CO2 26 02/17/2016    Lab Results  Component Value Date   ALT 22 02/17/2016   AST 22 02/17/2016   ALKPHOS 137 (H) 02/17/2016     Microbiology: Recent Results (from the past 240 hour(s))  Blood Culture (routine x 2)     Status: None   Collection Time: 02/08/16  4:43 AM  Result Value Ref Range Status   Specimen Description BLOOD LEFT HAND  Final   Special Requests IN PEDIATRIC BOTTLE 3ML  Final   Culture NO GROWTH 5 DAYS  Final   Report Status 02/13/2016 FINAL  Final  Blood Culture (routine x 2)     Status: None   Collection Time: 02/08/16  5:00 AM  Result Value Ref Range Status   Specimen Description BLOOD RIGHT FOREARM  Final   Special Requests IN PEDIATRIC BOTTLE 2ML  Final   Culture NO GROWTH 5 DAYS  Final   Report Status 02/13/2016 FINAL  Final  Aerobic/Anaerobic Culture (surgical/deep wound)     Status: None   Collection Time: 02/08/16 11:55 AM  Result Value Ref  Range Status   Specimen Description PERITONEAL  Final   Special Requests POF PRIMAXIN AND VANCOMYCIN  Final   Gram Stain   Final    RARE WBC PRESENT,BOTH PMN AND MONONUCLEAR MODERATE GRAM NEGATIVE RODS RARE GRAM POSITIVE RODS RARE GRAM POSITIVE COCCI IN PAIRS AND CHAINS    Culture   Final    MODERATE ESCHERICHIA COLI FEW ACINETOBACTER CALCOACETICUS/BAUMANNII COMPLEX MULTI-DRUG RESISTANT ORGANISM MODERATE BACTEROIDES CACCAE BETA LACTAMASE POSITIVE    Report Status 02/13/2016 FINAL  Final   Organism ID, Bacteria ESCHERICHIA COLI  Final   Organism ID, Bacteria ACINETOBACTER CALCOACETICUS/BAUMANNII COMPLEX  Final      Susceptibility   Acinetobacter calcoaceticus/baumannii complex - MIC*    CEFTAZIDIME >=64 RESISTANT Resistant     CEFTRIAXONE >=64 RESISTANT Resistant     CIPROFLOXACIN >=4 RESISTANT Resistant     GENTAMICIN 8 INTERMEDIATE Intermediate     IMIPENEM >=16 RESISTANT Resistant     PIP/TAZO >=128 RESISTANT Resistant     TRIMETH/SULFA >=320 RESISTANT Resistant     CEFEPIME >=64 RESISTANT Resistant     AMPICILLIN/SULBACTAM 16 RESISTANT Resistant     * FEW ACINETOBACTER CALCOACETICUS/BAUMANNII COMPLEX   Escherichia coli - MIC*    AMPICILLIN >=32 RESISTANT Resistant     CEFAZOLIN <=4 SENSITIVE Sensitive  CEFEPIME <=1 SENSITIVE Sensitive     CEFTAZIDIME <=1 SENSITIVE Sensitive     CEFTRIAXONE <=1 SENSITIVE Sensitive     CIPROFLOXACIN <=0.25 SENSITIVE Sensitive     GENTAMICIN <=1 SENSITIVE Sensitive     IMIPENEM <=0.25 SENSITIVE Sensitive     TRIMETH/SULFA <=20 SENSITIVE Sensitive     AMPICILLIN/SULBACTAM >=32 RESISTANT Resistant     PIP/TAZO <=4 SENSITIVE Sensitive     Extended ESBL NEGATIVE Sensitive     * MODERATE ESCHERICHIA COLI  Urine culture     Status: None   Collection Time: 02/08/16  1:03 PM  Result Value Ref Range Status   Specimen Description URINE, RANDOM  Final   Special Requests NONE  Final   Culture NO GROWTH  Final   Report Status 02/10/2016 FINAL   Final  MRSA PCR Screening     Status: None   Collection Time: 02/08/16  1:15 PM  Result Value Ref Range Status   MRSA by PCR NEGATIVE NEGATIVE Final    Comment:        The GeneXpert MRSA Assay (FDA approved for NASAL specimens only), is one component of a comprehensive MRSA colonization surveillance program. It is not intended to diagnose MRSA infection nor to guide or monitor treatment for MRSA infections.     Impression/Plan:  1. intraabonminal abscess - on appropriate antibiotics and improving. Correction to end date, today is day 4/7 of tigecycline with stop date of tige/tobra for 11/6.   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

## 2016-02-17 NOTE — Progress Notes (Signed)
[facility] Progress Note  5 Days Post-Op  Subjective: Patient improving. States "I feel great, Im so thankful for my new life".  Abdominal pain rated as constantly 8/10 but manageable. Pulling 2500 on IS. Tolerating diet, denies N/V.  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 Denies leakage around pouch.  Objective: Vital signs in last 24 hours: Temp:  [98 F (36.7 C)-98.5 F (36.9 C)] 98.1 F (36.7 C) (11/03 0450) Pulse Rate:  [67-97] 79 (11/03 0450) Resp:  [15-29] 22 (11/03 0450) BP: (107-123)/(61-77) 107/61 (11/03 0450) SpO2:  [99 %-100 %] 100 % (11/03 0450) Last BM Date: 02/16/16  Intake/Output from previous day: 11/02 0701 - 11/03 0700 In: 1292.8 [P.O.:480; I.V.:100; IV Piggyback:712.8] Out: 1375 [Urine:500; Stool:875]  PE: Gen:  Alert, NAD, pleasant Pulm:  CTA, no W/R/R Abd: Soft, appropriate incisional tenderness, ND, ileostomy pouch with good seal and bilious liquid output. Ext:  No erythema, edema, or tenderness  Lab Results:   Recent Labs  02/16/16 0420 02/17/16 0435  WBC 15.1* 16.5*  HGB 7.7* 8.2*  HCT 22.7* 24.5*  PLT 494* 574*   BMET  Recent Labs  02/16/16 0420 02/16/16 1632 02/17/16 0435  NA 136  --  135  K 3.2* 3.8 3.7  CL 100*  --  103  CO2 28  --  26  GLUCOSE 80  --  83  BUN 20  --  20  CREATININE 0.53*  --  0.54*  CALCIUM 7.4*  --  7.6*   CMP     Component Value Date/Time   NA 135 02/17/2016 0435   K 3.7 02/17/2016 0435   CL 103 02/17/2016 0435   CO2 26 02/17/2016 0435   GLUCOSE 83 02/17/2016 0435   BUN 20 02/17/2016 0435   CREATININE 0.54 (L) 02/17/2016 0435   CALCIUM 7.6 (L) 02/17/2016 0435   PROT 5.0 (L) 02/17/2016 0435   ALBUMIN 1.6 (L) 02/17/2016 0435   AST 22 02/17/2016 0435   ALT 22 02/17/2016 0435   ALKPHOS 137 (H) 02/17/2016 0435   BILITOT 0.6 02/17/2016 0435   GFRNONAA >60 02/17/2016 0435   GFRAA  >60 02/17/2016 0435   Lipase     Component Value Date/Time   LIPASE 16 02/08/2016 0412   Anti-infectives: Anti-infectives    Start     Dose/Rate Route Frequency Ordered Stop   02/15/16 0031  tigecycline (TYGACIL) 50 mg in sodium chloride 0.9 % 100 mL IVPB     50 mg 200 mL/hr over 30 Minutes Intravenous Every 12 hours 02/14/16 1232 02/18/16 2359   02/14/16 1400  tobramycin (NEBCIN) 510 mg in dextrose 5 % 100 mL IVPB     7 mg/kg  72.9 kg 112.8 mL/hr over 60 Minutes Intravenous Every 24 hours 02/14/16 1241 02/18/16 2359   02/14/16 1245  tigecycline (TYGACIL) 100 mg in sodium chloride 0.9 % 100 mL IVPB     100 mg 200 mL/hr over 30 Minutes Intravenous  Once 02/14/16 1232 02/14/16 1342   02/13/16 1200  cefTRIAXone (ROCEPHIN) 2 g in dextrose 5 % 50 mL IVPB  Status:  Discontinued     2 g 100 mL/hr over 30 Minutes Intravenous Every 24 hours 02/13/16 0857 02/14/16 1205   02/13/16 1200  tobramycin (NEBCIN) 530 mg in dextrose 5 % 100 mL IVPB  Status:  Discontinued     7 mg/kg  75.5 kg (Adjusted) 113.3 mL/hr over 60 Minutes Intravenous Every  24 hours 02/13/16 1123 02/14/16 1234   02/10/16 1500  anidulafungin (ERAXIS) 100 mg in sodium chloride 0.9 % 100 mL IVPB     100 mg over 90 Minutes Intravenous Every 24 hours 02/09/16 0804 02/15/16 1556   02/10/16 1500  vancomycin (VANCOCIN) IVPB 1000 mg/200 mL premix  Status:  Discontinued     1,000 mg 200 mL/hr over 60 Minutes Intravenous Every 8 hours 02/10/16 1239 02/13/16 0856   02/10/16 1500  imipenem-cilastatin (PRIMAXIN) 1,000 mg in sodium chloride 0.9 % 250 mL IVPB  Status:  Discontinued     1,000 mg 250 mL/hr over 60 Minutes Intravenous Every 8 hours 02/10/16 1239 02/13/16 0856   02/09/16 1000  imipenem-cilastatin (PRIMAXIN) 1,000 mg in sodium chloride 0.9 % 250 mL IVPB  Status:  Discontinued     1,000 mg 250 mL/hr over 60 Minutes Intravenous Every 8 hours 02/09/16 0812 02/10/16 1239   02/09/16 1000  vancomycin (VANCOCIN) IVPB 1000 mg/200 mL  premix  Status:  Discontinued     1,000 mg 200 mL/hr over 60 Minutes Intravenous Every 8 hours 02/09/16 0812 02/10/16 1239   02/09/16 0900  anidulafungin (ERAXIS) 200 mg in sodium chloride 0.9 % 200 mL IVPB     200 mg over 180 Minutes Intravenous  Once 02/09/16 0804 02/09/16 1209   02/09/16 0830  anidulafungin (ERAXIS) 100 mg in sodium chloride 0.9 % 100 mL IVPB  Status:  Discontinued     100 mg over 90 Minutes Intravenous Every 24 hours 02/08/16 0811 02/09/16 0804   02/08/16 1800  vancomycin (VANCOCIN) IVPB 750 mg/150 ml premix  Status:  Discontinued     750 mg 150 mL/hr over 60 Minutes Intravenous Every 8 hours 02/08/16 0847 02/09/16 0812   02/08/16 1400  piperacillin-tazobactam (ZOSYN) IVPB 3.375 g  Status:  Discontinued     3.375 g 12.5 mL/hr over 240 Minutes Intravenous Every 8 hours 02/08/16 0432 02/08/16 0802   02/08/16 0900  imipenem-cilastatin (PRIMAXIN) 500 mg in sodium chloride 0.9 % 100 mL IVPB  Status:  Discontinued     500 mg 200 mL/hr over 30 Minutes Intravenous Every 6 hours 02/08/16 0818 02/09/16 0812   02/08/16 0830  ertapenem (INVANZ) 1 g in sodium chloride 0.9 % 50 mL IVPB  Status:  Discontinued     1 g 100 mL/hr over 30 Minutes Intravenous Every 24 hours 02/08/16 0756 02/08/16 0812   02/08/16 0830  anidulafungin (ERAXIS) 200 mg in sodium chloride 0.9 % 200 mL IVPB  Status:  Discontinued     200 mg over 180 Minutes Intravenous  Once 02/08/16 0811 02/09/16 1457   02/08/16 0830  vancomycin (VANCOCIN) 1,500 mg in sodium chloride 0.9 % 500 mL IVPB  Status:  Discontinued     1,500 mg 250 mL/hr over 120 Minutes Intravenous  Once 02/08/16 0815 02/09/16 1355   02/08/16 0815  vancomycin (VANCOCIN) 1,500 mg in sodium chloride 0.9 % 500 mL IVPB  Status:  Discontinued     1,500 mg 250 mL/hr over 120 Minutes Intravenous Every 12 hours 02/08/16 0811 02/08/16 0812   02/08/16 0430  piperacillin-tazobactam (ZOSYN) IVPB 3.375 g     3.375 g 100 mL/hr over 30 Minutes Intravenous  Once  02/08/16 0419 02/08/16 0532      Assessment/Plan Crohns disease s/p ileocecectomy 01/27/16 for SB perforation Anastomotic leak/ peritoneal abscess - s/p exp lap/ resection of anastomosis 02/08/16 Washout/ VAC change 02/10/16 Washout/ fascial closure 02/12/16 Acute blood loss anemia - stable WBC - slight bump today  16.5 from 15.1 ; ID recs: tigecycline and tobra through 02/18/16 Phos/Mag/Na/K are all WNL this AM   Improving Ostomy pink  Advance diet as tolerated No plans for return to OR Wet to dry dressings to midline wound  Will need to keep up with fluid loss from ileostomy output - continue max dose imodium; ileostomy output down today  Follow CBC     LOS: 9 days    Jill Alexanders , [facility] 02/17/2016, 8:26 AM Pager: [phone] Consults: [phone] Mon-Fri 7:00 am-4:30 pm Sat-Sun 7:00 am-11:30 am

## 2016-02-17 NOTE — Progress Notes (Signed)
Speech Language Pathology Treatment: Dysphagia  Patient Details Name: [name] MRN: [id number] DOB: 04-03-1966 Today's Date: 02/17/2016 Time: 2458-0998 SLP Time Calculation (min) (ACUTE ONLY): 8 min  Assessment / Plan / Recommendation Clinical Impression  Pt demonstrates adequate tolerance of dys 2 diet, eating enthusiastically, but not impulsively. Verbalizes awareness of basic strategies. No signs of aspiration other then intermittent moist, but not wet vocal quality with speech during meals. Discussed softening foods with wife. Recommend pt continue current diet, No skilled f/u needed will sign off.    HPI HPI: 50 year old white male with a past medical history of small bowel perforation on 01/27/16 s/p resection with subsequent wound dehisence. He was d/c on 02/02/16. Developed septic shock, ischemic bowel, abscess. Pt was taken to the OR on 02/08/16 for exploratory surgery. Drained abscess, removed ileocecum. Pt intubated on 10/25 and extubated on 10/30.      SLP Plan  All goals met     Recommendations  Diet recommendations: Dysphagia 2 (fine chop);Thin liquid Liquids provided via: Straw;Cup Medication Administration: Whole meds with puree Supervision: Intermittent supervision to cue for compensatory strategies;Patient able to self feed Compensations: Minimize environmental distractions;Slow rate;Small sips/bites Postural Changes and/or Swallow Maneuvers: Seated upright 90 degrees;Upright 30-60 min after meal                Oral Care Recommendations: Oral care BID Follow up Recommendations: None Plan: All goals met       GO               Herbie Baltimore, MA CCC-SLP [phone]  Lynann Beaver 02/17/2016, 9:40 AM

## 2016-02-17 NOTE — Progress Notes (Signed)
Physical Therapy Treatment Patient Details Name: [name] MRN: [id number] DOB: [birth year] Today's Date: 02/21/16    History of Present Illness Patient is a 50 y/o male admitted with septic shock and exp lap on 10/25 with ileocectomy, 10/27 ileostomy, VDRF 10/25-10/30. Pt with recent admission for small bowel perf on 10/13 with resection and D/C 10/19. PMHx asthma , back sx    PT Comments    Pt progressing from previous session to pushing IV pole.  Plan to try cane next visit vs. RW.  Endurance remains largest limiting factor and AD will help conserve energy.    Follow Up Recommendations  Home health PT;Supervision for mobility/OOB     Equipment Recommendations       Recommendations for Other Services       Precautions / Restrictions Precautions Precautions: Fall Precaution Comments: ostomy Restrictions Weight Bearing Restrictions: No    Mobility  Bed Mobility               General bed mobility comments: Pt received sitting in recliner on arrival.    Transfers Overall transfer level: Needs assistance Equipment used: None Transfers: Sit to/from Stand Sit to Stand: Supervision         General transfer comment: Good technique observed.    Ambulation/Gait Ambulation/Gait assistance: Supervision Ambulation Distance (Feet): 450 Feet Assistive device:  (pushed IV pole) Gait Pattern/deviations: Step-through pattern;Trunk flexed Gait velocity: decreased   General Gait Details: Cues for posture and pacing.  Increased WOB observed.     Stairs            Wheelchair Mobility    Modified Rankin (Stroke Patients Only)       Balance Overall balance assessment: Needs assistance   Sitting balance-Leahy Scale: Good       Standing balance-Leahy Scale: Fair                      Cognition Arousal/Alertness: Awake/alert Behavior During Therapy: WFL for tasks assessed/performed Overall Cognitive Status: Within Functional Limits for tasks  assessed                      Exercises      General Comments        Pertinent Vitals/Pain Pain Assessment: Faces Faces Pain Scale: Hurts a little bit Pain Location: abdomen and back Pain Descriptors / Indicators: Discomfort Pain Intervention(s): Monitored during session;Repositioned    Home Living                      Prior Function            PT Goals (current goals can now be found in the care plan section) Acute Rehab PT Goals Patient Stated Goal: return home Potential to Achieve Goals: Good Progress towards PT goals: Progressing toward goals    Frequency    Min 3X/week      PT Plan Current plan remains appropriate    Co-evaluation             End of Session Equipment Utilized During Treatment: Gait belt Activity Tolerance: Patient tolerated treatment well Patient left: in chair;with call bell/phone within reach     Time: 1557-1614 PT Time Calculation (min) (ACUTE ONLY): 17 min  Charges:  $Gait Training: 8-22 mins                    G Codes:      Cristela Blue 2016-02-21, 4:22 PM  Governor Rooks, PTA pager [phone]

## 2016-02-17 NOTE — Progress Notes (Signed)
Verified with Dr. Ree Kida that plan is for patient to remain in house through the weekend for IV abx and colostomy care and training. Updated Sharalyn Ink CM with worker's comp. Also faxed clinicals from 11/1-11/3/17.

## 2016-02-18 LAB — VITAMIN B12: Vitamin B-12: 1445 pg/mL — ABNORMAL HIGH (ref 180–914)

## 2016-02-18 LAB — IRON AND TIBC
IRON: 11 ug/dL — AB (ref 45–182)
Saturation Ratios: 6 % — ABNORMAL LOW (ref 17.9–39.5)
TIBC: 196 ug/dL — AB (ref 250–450)
UIBC: 185 ug/dL

## 2016-02-18 LAB — CBC
HEMATOCRIT: 21.7 % — AB (ref 39.0–52.0)
Hemoglobin: 7.3 g/dL — ABNORMAL LOW (ref 13.0–17.0)
MCH: 27.1 pg (ref 26.0–34.0)
MCHC: 33.6 g/dL (ref 30.0–36.0)
MCV: 80.7 fL (ref 78.0–100.0)
Platelets: 558 10*3/uL — ABNORMAL HIGH (ref 150–400)
RBC: 2.69 MIL/uL — ABNORMAL LOW (ref 4.22–5.81)
RDW: 15.9 % — AB (ref 11.5–15.5)
WBC: 12.6 10*3/uL — AB (ref 4.0–10.5)

## 2016-02-18 LAB — COMPREHENSIVE METABOLIC PANEL
ALBUMIN: 1.5 g/dL — AB (ref 3.5–5.0)
ALK PHOS: 119 U/L (ref 38–126)
ALT: 19 U/L (ref 17–63)
ANION GAP: 5 (ref 5–15)
AST: 19 U/L (ref 15–41)
BILIRUBIN TOTAL: 0.4 mg/dL (ref 0.3–1.2)
BUN: 21 mg/dL — AB (ref 6–20)
CALCIUM: 7.3 mg/dL — AB (ref 8.9–10.3)
CO2: 24 mmol/L (ref 22–32)
CREATININE: 0.57 mg/dL — AB (ref 0.61–1.24)
Chloride: 105 mmol/L (ref 101–111)
GFR calc Af Amer: >60 mL/min (ref >60)
GFR calc non Af Amer: >60 mL/min (ref >60)
GLUCOSE: 80 mg/dL (ref 65–99)
Potassium: 3.5 mmol/L (ref 3.5–5.1)
SODIUM: 134 mmol/L — AB (ref 135–145)
TOTAL PROTEIN: 4.8 g/dL — AB (ref 6.5–8.1)

## 2016-02-18 LAB — MAGNESIUM: Magnesium: 2 mg/dL (ref 1.7–2.4)

## 2016-02-18 LAB — FERRITIN: FERRITIN: 257 ng/mL (ref 24–336)

## 2016-02-18 LAB — PHOSPHORUS: Phosphorus: 3.6 mg/dL (ref 2.5–4.6)

## 2016-02-18 LAB — FOLATE: Folate: 21.5 ng/mL (ref >5.9)

## 2016-02-18 LAB — RETICULOCYTES
RBC.: 3.24 MIL/uL — ABNORMAL LOW (ref 4.22–5.81)
RETIC COUNT ABSOLUTE: 74.5 10*3/uL (ref 19.0–186.0)
RETIC CT PCT: 2.3 % (ref 0.4–3.1)

## 2016-02-18 MED ORDER — TOBRAMYCIN SULFATE 80 MG/2ML IJ SOLN
7.0000 mg/kg | INTRAVENOUS | Status: AC
  Administered 2016-02-18 – 2016-02-20 (×3): 490 mg via INTRAVENOUS
  Filled 2016-02-18 (×3): qty 12.25

## 2016-02-18 MED ORDER — POTASSIUM CHLORIDE CRYS ER 20 MEQ PO TBCR
40.0000 meq | EXTENDED_RELEASE_TABLET | Freq: Once | ORAL | Status: AC
  Administered 2016-02-18: 40 meq via ORAL
  Filled 2016-02-18: qty 2

## 2016-02-18 MED ORDER — PSYLLIUM 95 % PO PACK
1.0000 | PACK | Freq: Two times a day (BID) | ORAL | Status: DC
  Administered 2016-02-18 – 2016-02-27 (×19): 1 via ORAL
  Filled 2016-02-18 (×20): qty 1

## 2016-02-18 NOTE — Progress Notes (Signed)
6 Days Post-Op  Subjective: Pt with cont' leakage around ileostomy  Objective: Vital signs in last 24 hours: Temp:  [97.6 F (36.4 C)-98.2 F (36.8 C)] 98.2 F (36.8 C) (11/04 [id number]) Pulse Rate:  [60-141] 60 (11/04 0608) Resp:  [18-20] 20 (11/04 [id number]) BP: (107-124)/(59-64) 124/64 (11/04 [id number]) SpO2:  [67 %-100 %] 100 % (11/04 [id number]) Last BM Date: 02/17/16 (Colostomy)  Intake/Output from previous day: 11/03 0701 - 11/04 0700 In: 1132.8 [P.O.:720; IV Piggyback:412.8] Out: 2575 [Urine:1225; Stool:1350] Intake/Output this shift: Total I/O In: -  Out: 450 [Stool:450]  General appearance: alert and cooperative GI: soft, approp ttp, Nd, incision c/d/i, ostomy patent  Lab Results:   Recent Labs  02/17/16 0435 02/18/16 0500  WBC 16.5* 12.6*  HGB 8.2* 7.3*  HCT 24.5* 21.7*  PLT 574* 558*   BMET  Recent Labs  02/17/16 0435 02/18/16 0500  NA 135 134*  K 3.7 3.5  CL 103 105  CO2 26 24  GLUCOSE 83 80  BUN 20 21*  CREATININE 0.54* 0.57*  CALCIUM 7.6* 7.3*   PT/INR No results for input(s): LABPROT, INR in the last 72 hours. ABG No results for input(s): PHART, HCO3 in the last 72 hours.  Invalid input(s): PCO2, PO2  Studies/Results: No results found.  Anti-infectives: Anti-infectives    Start     Dose/Rate Route Frequency Ordered Stop   02/18/16 1400  tobramycin (NEBCIN) 490 mg in dextrose 5 % 100 mL IVPB     7 mg/kg  70.7 kg (Ideal) 112.3 mL/hr over 60 Minutes Intravenous Every 24 hours 02/18/16 1041 02/20/16 2359   02/15/16 0031  tigecycline (TYGACIL) 50 mg in sodium chloride 0.9 % 100 mL IVPB     50 mg 200 mL/hr over 30 Minutes Intravenous Every 12 hours 02/14/16 1232 02/20/16 2359   02/14/16 1400  tobramycin (NEBCIN) 510 mg in dextrose 5 % 100 mL IVPB  Status:  Discontinued     7 mg/kg  72.9 kg 112.8 mL/hr over 60 Minutes Intravenous Every 24 hours 02/14/16 1241 02/18/16 1041   02/14/16 1245  tigecycline (TYGACIL) 100 mg in sodium chloride 0.9 % 100 mL  IVPB     100 mg 200 mL/hr over 30 Minutes Intravenous  Once 02/14/16 1232 02/14/16 1342   02/13/16 1200  cefTRIAXone (ROCEPHIN) 2 g in dextrose 5 % 50 mL IVPB  Status:  Discontinued     2 g 100 mL/hr over 30 Minutes Intravenous Every 24 hours 02/13/16 0857 02/14/16 1205   02/13/16 1200  tobramycin (NEBCIN) 530 mg in dextrose 5 % 100 mL IVPB  Status:  Discontinued     7 mg/kg  75.5 kg (Adjusted) 113.3 mL/hr over 60 Minutes Intravenous Every 24 hours 02/13/16 1123 02/14/16 1234   02/10/16 1500  anidulafungin (ERAXIS) 100 mg in sodium chloride 0.9 % 100 mL IVPB     100 mg over 90 Minutes Intravenous Every 24 hours 02/09/16 0804 02/15/16 1556   02/10/16 1500  vancomycin (VANCOCIN) IVPB 1000 mg/200 mL premix  Status:  Discontinued     1,000 mg 200 mL/hr over 60 Minutes Intravenous Every 8 hours 02/10/16 1239 02/13/16 0856   02/10/16 1500  imipenem-cilastatin (PRIMAXIN) 1,000 mg in sodium chloride 0.9 % 250 mL IVPB  Status:  Discontinued     1,000 mg 250 mL/hr over 60 Minutes Intravenous Every 8 hours 02/10/16 1239 02/13/16 0856   02/09/16 1000  imipenem-cilastatin (PRIMAXIN) 1,000 mg in sodium chloride 0.9 % 250 mL IVPB  Status:  Discontinued  1,000 mg 250 mL/hr over 60 Minutes Intravenous Every 8 hours 02/09/16 0812 02/10/16 1239   02/09/16 1000  vancomycin (VANCOCIN) IVPB 1000 mg/200 mL premix  Status:  Discontinued     1,000 mg 200 mL/hr over 60 Minutes Intravenous Every 8 hours 02/09/16 0812 02/10/16 1239   02/09/16 0900  anidulafungin (ERAXIS) 200 mg in sodium chloride 0.9 % 200 mL IVPB     200 mg over 180 Minutes Intravenous  Once 02/09/16 0804 02/09/16 1209   02/09/16 0830  anidulafungin (ERAXIS) 100 mg in sodium chloride 0.9 % 100 mL IVPB  Status:  Discontinued     100 mg over 90 Minutes Intravenous Every 24 hours 02/08/16 0811 02/09/16 0804   02/08/16 1800  vancomycin (VANCOCIN) IVPB 750 mg/150 ml premix  Status:  Discontinued     750 mg 150 mL/hr over 60 Minutes Intravenous  Every 8 hours 02/08/16 0847 02/09/16 0812   02/08/16 1400  piperacillin-tazobactam (ZOSYN) IVPB 3.375 g  Status:  Discontinued     3.375 g 12.5 mL/hr over 240 Minutes Intravenous Every 8 hours 02/08/16 0432 02/08/16 0802   02/08/16 0900  imipenem-cilastatin (PRIMAXIN) 500 mg in sodium chloride 0.9 % 100 mL IVPB  Status:  Discontinued     500 mg 200 mL/hr over 30 Minutes Intravenous Every 6 hours 02/08/16 0818 02/09/16 0812   02/08/16 0830  ertapenem (INVANZ) 1 g in sodium chloride 0.9 % 50 mL IVPB  Status:  Discontinued     1 g 100 mL/hr over 30 Minutes Intravenous Every 24 hours 02/08/16 0756 02/08/16 0812   02/08/16 0830  anidulafungin (ERAXIS) 200 mg in sodium chloride 0.9 % 200 mL IVPB  Status:  Discontinued     200 mg over 180 Minutes Intravenous  Once 02/08/16 0811 02/09/16 1457   02/08/16 0830  vancomycin (VANCOCIN) 1,500 mg in sodium chloride 0.9 % 500 mL IVPB  Status:  Discontinued     1,500 mg 250 mL/hr over 120 Minutes Intravenous  Once 02/08/16 0815 02/09/16 1355   02/08/16 0815  vancomycin (VANCOCIN) 1,500 mg in sodium chloride 0.9 % 500 mL IVPB  Status:  Discontinued     1,500 mg 250 mL/hr over 120 Minutes Intravenous Every 12 hours 02/08/16 0811 02/08/16 0812   02/08/16 0430  piperacillin-tazobactam (ZOSYN) IVPB 3.375 g     3.375 g 100 mL/hr over 30 Minutes Intravenous  Once 02/08/16 0419 02/08/16 0532      Assessment/Plan: Crohns disease s/p ileocecectomy 01/27/16 for SB perforation Anastomotic leak/ peritoneal abscess - s/p exp lap/ resection of anastomosis 02/08/16 Washout/ VAC change 02/10/16 Washout/ fascial closure 02/12/16 Acute blood loss anemia - stable WBC -down today; ID recs: tigecycline and tobra through 02/18/16 Phos/Mag/Na/K are all WNL this AM   Improving Ostomy pink  Advance diet as tolerated Wet to dry dressings to midline wound  Will add metamucil to help decease ileostomy output    LOS: 10 days    Rosario Jacks., Anne Hahn 02/18/2016

## 2016-02-18 NOTE — Progress Notes (Signed)
PROGRESS NOTE    [name]  [id number] DOB: 1965/08/06 DOA: 02/08/2016 PCP: No primary care provider on file.   Chief Complaint  Patient presents with  . Abdominal Pain    Brief Narrative:  HPI on 02/08/2016 by Ms. [name], PA (Surgery) 50 y/o male with PMH back pain and small bowel perforation s/p ileocecectomy 01/28/16 who presented to [facility] with abdominal pain. He was previously discharged from hospital on 02/02/16. Patient states his abdominal pain started late last night/early this morning and is described as sharp and non-radiating. Rates pain as 10/10. Pain is worse with movement and inspiration. No relief with PO pain medication. Associated symptoms include nausea, chills, and SOB. Patient has been having daily BMs. Denies fever, CP, vomiting, diarrhea, melena, hematochezia, urinary symptoms, and weakness. He denies regular medication use.  Interim history  He required re-admit as he developed septic shock, ischemic bowel, and an abscess. 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 -Ongoing care per general surgery -Antibiotic management per ID: Tigecycline and tobramycin through 02/20/2016 -Drainage noted from ileostomy site, bag has been replaced multiple times in the past 24 hours -Wound care/ostomy reconsulted  L Pleural Effusion  -Small on f/u CXR -follow w/o intervention for now  -No complaints of shortness of breath  Septic shock -Resolved  Lower extremity edema -resolving  Hyopkalemia -Stable, K 3.5 (will give additional dose, goal 4) -Magnesium 2.1  AKI -Resolved  Normocytic Anemia -hemoglobin currently 7.3 -Continue to monitor CBC -Possibly due to recent surgery? -If Hemoglobin continues to drop <7, will transfuse -Will order anemia panel  DVT Prophylaxis  Lovenox (will switch to SCDs)  Code  Status: Full  Family Communication: None at bedside  Disposition Plan: Admitted. Discharge pending surgery recommendations. Antibiotics through 11/6  Consultants PCCM General surgery Infectious disease  Procedures  10/25 - readmitted - Intubated and central access obtained - Exploratory surgery, wound vac placement and ileocectomy  10/27 - Exploratory surgery-peritonitis, distal ileum ischemia , s/p resection w/ ileostomy  10/29 - Exploratory laparotomy with removal of vacuum pack dressing and closure of abdominal wall  10/30 - Extubated 11/1 - [facility] assumed care  Antibiotics   Anti-infectives    Start     Dose/Rate Route Frequency Ordered Stop   02/18/16 1400  tobramycin (NEBCIN) 490 mg in dextrose 5 % 100 mL IVPB     7 mg/kg  70.7 kg (Ideal) 112.3 mL/hr over 60 Minutes Intravenous Every 24 hours 02/18/16 1041 02/20/16 2359   02/15/16 0031  tigecycline (TYGACIL) 50 mg in sodium chloride 0.9 % 100 mL IVPB     50 mg 200 mL/hr over 30 Minutes Intravenous Every 12 hours 02/14/16 1232 02/20/16 2359   02/14/16 1400  tobramycin (NEBCIN) 510 mg in dextrose 5 % 100 mL IVPB  Status:  Discontinued     7 mg/kg  72.9 kg 112.8 mL/hr over 60 Minutes Intravenous Every 24 hours 02/14/16 1241 02/18/16 1041   02/14/16 1245  tigecycline (TYGACIL) 100 mg in sodium chloride 0.9 % 100 mL IVPB     100 mg 200 mL/hr over 30 Minutes Intravenous  Once 02/14/16 1232 02/14/16 1342   02/13/16 1200  cefTRIAXone (ROCEPHIN) 2 g in dextrose 5 % 50 mL IVPB  Status:  Discontinued     2 g 100 mL/hr over 30 Minutes Intravenous Every 24 hours 02/13/16 0857 02/14/16  1205   02/13/16 1200  tobramycin (NEBCIN) 530 mg in dextrose 5 % 100 mL IVPB  Status:  Discontinued     7 mg/kg  75.5 kg (Adjusted) 113.3 mL/hr over 60 Minutes Intravenous Every 24 hours 02/13/16 1123 02/14/16 1234   02/10/16 1500  anidulafungin (ERAXIS) 100 mg in sodium chloride 0.9 % 100 mL IVPB     100 mg over 90 Minutes Intravenous Every 24 hours  02/09/16 0804 02/15/16 1556   02/10/16 1500  vancomycin (VANCOCIN) IVPB 1000 mg/200 mL premix  Status:  Discontinued     1,000 mg 200 mL/hr over 60 Minutes Intravenous Every 8 hours 02/10/16 1239 02/13/16 0856   02/10/16 1500  imipenem-cilastatin (PRIMAXIN) 1,000 mg in sodium chloride 0.9 % 250 mL IVPB  Status:  Discontinued     1,000 mg 250 mL/hr over 60 Minutes Intravenous Every 8 hours 02/10/16 1239 02/13/16 0856   02/09/16 1000  imipenem-cilastatin (PRIMAXIN) 1,000 mg in sodium chloride 0.9 % 250 mL IVPB  Status:  Discontinued     1,000 mg 250 mL/hr over 60 Minutes Intravenous Every 8 hours 02/09/16 0812 02/10/16 1239   02/09/16 1000  vancomycin (VANCOCIN) IVPB 1000 mg/200 mL premix  Status:  Discontinued     1,000 mg 200 mL/hr over 60 Minutes Intravenous Every 8 hours 02/09/16 0812 02/10/16 1239   02/09/16 0900  anidulafungin (ERAXIS) 200 mg in sodium chloride 0.9 % 200 mL IVPB     200 mg over 180 Minutes Intravenous  Once 02/09/16 0804 02/09/16 1209   02/09/16 0830  anidulafungin (ERAXIS) 100 mg in sodium chloride 0.9 % 100 mL IVPB  Status:  Discontinued     100 mg over 90 Minutes Intravenous Every 24 hours 02/08/16 0811 02/09/16 0804   02/08/16 1800  vancomycin (VANCOCIN) IVPB 750 mg/150 ml premix  Status:  Discontinued     750 mg 150 mL/hr over 60 Minutes Intravenous Every 8 hours 02/08/16 0847 02/09/16 0812   02/08/16 1400  piperacillin-tazobactam (ZOSYN) IVPB 3.375 g  Status:  Discontinued     3.375 g 12.5 mL/hr over 240 Minutes Intravenous Every 8 hours 02/08/16 0432 02/08/16 0802   02/08/16 0900  imipenem-cilastatin (PRIMAXIN) 500 mg in sodium chloride 0.9 % 100 mL IVPB  Status:  Discontinued     500 mg 200 mL/hr over 30 Minutes Intravenous Every 6 hours 02/08/16 0818 02/09/16 0812   02/08/16 0830  ertapenem (INVANZ) 1 g in sodium chloride 0.9 % 50 mL IVPB  Status:  Discontinued     1 g 100 mL/hr over 30 Minutes Intravenous Every 24 hours 02/08/16 0756 02/08/16 0812    02/08/16 0830  anidulafungin (ERAXIS) 200 mg in sodium chloride 0.9 % 200 mL IVPB  Status:  Discontinued     200 mg over 180 Minutes Intravenous  Once 02/08/16 0811 02/09/16 1457   02/08/16 0830  vancomycin (VANCOCIN) 1,500 mg in sodium chloride 0.9 % 500 mL IVPB  Status:  Discontinued     1,500 mg 250 mL/hr over 120 Minutes Intravenous  Once 02/08/16 0815 02/09/16 1355   02/08/16 0815  vancomycin (VANCOCIN) 1,500 mg in sodium chloride 0.9 % 500 mL IVPB  Status:  Discontinued     1,500 mg 250 mL/hr over 120 Minutes Intravenous Every 12 hours 02/08/16 0811 02/08/16 0812   02/08/16 0430  piperacillin-tazobactam (ZOSYN) IVPB 3.375 g     3.375 g 100 mL/hr over 30 Minutes Intravenous  Once 02/08/16 0419 02/08/16 0532      Subjective:  [place] seen and examined today.  Patient very upset this morning as his his ostomy bag has been changed multiple times due to leakage.  Denies chest pain, shortness of breath, nausea, vomiting, dizziness, headache.   Objective:   Vitals:   02/17/16 0450 02/17/16 1514 02/17/16 2101 02/18/16 0608  BP: 107/61 (!) 107/59 115/62 124/64  Pulse: 79 (!) 141 (!) 102 60  Resp: (!) 22 18 18 20   Temp: 98.1 F (36.7 C)  97.6 F (36.4 C) 98.2 F (36.8 C)  TempSrc: Oral  Oral Oral  SpO2: 100% (!) 67% 90% 100%  Weight:      Height:        Intake/Output Summary (Last 24 hours) at 02/18/16 1143 Last data filed at 02/18/16 1048  Gross per 24 hour  Intake           772.75 ml  Output             2325 ml  Net         -1552.25 ml   Filed Weights   02/13/16 0350 02/14/16 0400 02/16/16 0400  Weight: 82.6 kg (182 lb 1.6 oz) 72.9 kg (160 lb 11.5 oz) 55.9 kg (123 lb 3.8 oz)    Exam  General: Well developed, well nourished, NAD, appears stated age  58: NCAT,mucous membranes moist.   Cardiovascular: S1 S2 auscultated, no murmurs, RRR  Respiratory: Clear to auscultation bilaterally with equal chest rise  Abdomen: Soft, nontender, nondistended, + bowel  sounds, ileostomy (bag- leakage)  Extremities: warm dry without cyanosis clubbing or edema  Neuro: AAOx3, nonfocal   Psych: Normal affect and demeanor   Data Reviewed: I have personally reviewed following labs and imaging studies  CBC:  Recent Labs Lab 02/13/16 0601  02/14/16 1736 02/15/16 0250 02/16/16 0420 02/17/16 0435 02/18/16 0500  WBC 13.7*  < > 16.5* 16.6* 15.1* 16.5* 12.6*  NEUTROABS 10.0*  --  13.0*  --   --   --   --   HGB 10.0*  < > 7.6* 7.7* 7.7* 8.2* 7.3*  HCT 29.6*  < > 22.7* 22.3* 22.7* 24.5* 21.7*  MCV 80.9  < > 80.2 79.9 79.9 80.1 80.7  PLT 248  < > 357 390 494* 574* 558*  < > = values in this interval not displayed. Basic Metabolic Panel:  Recent Labs Lab 02/13/16 0601 02/14/16 0345 02/14/16 1530 02/15/16 0250 02/16/16 0420 02/16/16 1632 02/17/16 0435 02/18/16 0500  NA 131* 134* 135 136 136  --  135 134*  K 3.2* 2.6* 3.5 3.3* 3.2* 3.8 3.7 3.5  CL 97* 96* 98* 97* 100*  --  103 105  CO2 28 32 31 31 28   --  26 24  GLUCOSE 122* 129* 112* 87 80  --  83 80  BUN 5* 8 9 13 20   --  20 21*  CREATININE 0.41* 0.44* 0.45* 0.49* 0.53*  --  0.54* 0.57*  CALCIUM 6.8* 7.3* 7.7* 7.6* 7.4*  --  7.6* 7.3*  MG 1.7 2.0  --   --   --  2.0 2.1 2.0  PHOS 3.2 3.2  --   --   --  3.8 3.4 3.6   GFR: Estimated Creatinine Clearance: 88.3 mL/min (by C-G formula based on SCr of 0.57 mg/dL (L)). Liver Function Tests:  Recent Labs Lab 02/13/16 0601 02/16/16 0420 02/17/16 0435 02/18/16 0500  AST 24 25 22 19   ALT 17 20 22 19   ALKPHOS 139* 141* 137* 119  BILITOT 0.5 0.4  0.6 0.4  PROT 3.8* 4.9* 5.0* 4.8*  ALBUMIN 1.1* 1.5* 1.6* 1.5*   No results for input(s): LIPASE, AMYLASE in the last 168 hours. No results for input(s): AMMONIA in the last 168 hours. Coagulation Profile: No results for input(s): INR, PROTIME in the last 168 hours. Cardiac Enzymes: No results for input(s): CKTOTAL, CKMB, CKMBINDEX, TROPONINI in the last 168 hours. BNP (last 3 results) No results  for input(s): PROBNP in the last 8760 hours. HbA1C: No results for input(s): HGBA1C in the last 72 hours. CBG:  Recent Labs Lab 02/15/16 0451 02/15/16 0828 02/15/16 1122 02/15/16 1554 02/16/16 0728  GLUCAP 82 77 80 107* 79   Lipid Profile: No results for input(s): CHOL, HDL, LDLCALC, TRIG, CHOLHDL, LDLDIRECT in the last 72 hours. Thyroid Function Tests: No results for input(s): TSH, T4TOTAL, FREET4, T3FREE, THYROIDAB in the last 72 hours. Anemia Panel: No results for input(s): VITAMINB12, FOLATE, FERRITIN, TIBC, IRON, RETICCTPCT in the last 72 hours. Urine analysis:    Component Value Date/Time   COLORURINE AMBER (A) 02/08/2016 1303   APPEARANCEUR CLOUDY (A) 02/08/2016 1303   LABSPEC >1.046 (H) 02/08/2016 1303   PHURINE 6.0 02/08/2016 1303   GLUCOSEU NEGATIVE 02/08/2016 1303   HGBUR TRACE (A) 02/08/2016 1303   BILIRUBINUR NEGATIVE 02/08/2016 1303   KETONESUR NEGATIVE 02/08/2016 1303   PROTEINUR 30 (A) 02/08/2016 1303   UROBILINOGEN 1.0 07/28/2007 1539   NITRITE NEGATIVE 02/08/2016 1303   LEUKOCYTESUR NEGATIVE 02/08/2016 1303   Sepsis Labs: @LABRCNTIP (procalcitonin:4,lacticidven:4)  ) Recent Results (from the past 240 hour(s))  Aerobic/Anaerobic Culture (surgical/deep wound)     Status: None   Collection Time: 02/08/16 11:55 AM  Result Value Ref Range Status   Specimen Description PERITONEAL  Final   Special Requests POF PRIMAXIN AND VANCOMYCIN  Final   Gram Stain   Final    RARE WBC PRESENT,BOTH PMN AND MONONUCLEAR MODERATE GRAM NEGATIVE RODS RARE GRAM POSITIVE RODS RARE GRAM POSITIVE COCCI IN PAIRS AND CHAINS    Culture   Final    MODERATE ESCHERICHIA COLI FEW ACINETOBACTER CALCOACETICUS/BAUMANNII COMPLEX MULTI-DRUG RESISTANT ORGANISM MODERATE BACTEROIDES CACCAE BETA LACTAMASE POSITIVE    Report Status 02/13/2016 FINAL  Final   Organism ID, Bacteria ESCHERICHIA COLI  Final   Organism ID, Bacteria ACINETOBACTER CALCOACETICUS/BAUMANNII COMPLEX  Final       Susceptibility   Acinetobacter calcoaceticus/baumannii complex - MIC*    CEFTAZIDIME >=64 RESISTANT Resistant     CEFTRIAXONE >=64 RESISTANT Resistant     CIPROFLOXACIN >=4 RESISTANT Resistant     GENTAMICIN 8 INTERMEDIATE Intermediate     IMIPENEM >=16 RESISTANT Resistant     PIP/TAZO >=128 RESISTANT Resistant     TRIMETH/SULFA >=320 RESISTANT Resistant     CEFEPIME >=64 RESISTANT Resistant     AMPICILLIN/SULBACTAM 16 RESISTANT Resistant     * FEW ACINETOBACTER CALCOACETICUS/BAUMANNII COMPLEX   Escherichia coli - MIC*    AMPICILLIN >=32 RESISTANT Resistant     CEFAZOLIN <=4 SENSITIVE Sensitive     CEFEPIME <=1 SENSITIVE Sensitive     CEFTAZIDIME <=1 SENSITIVE Sensitive     CEFTRIAXONE <=1 SENSITIVE Sensitive     CIPROFLOXACIN <=0.25 SENSITIVE Sensitive     GENTAMICIN <=1 SENSITIVE Sensitive     IMIPENEM <=0.25 SENSITIVE Sensitive     TRIMETH/SULFA <=20 SENSITIVE Sensitive     AMPICILLIN/SULBACTAM >=32 RESISTANT Resistant     PIP/TAZO <=4 SENSITIVE Sensitive     Extended ESBL NEGATIVE Sensitive     * MODERATE ESCHERICHIA COLI  Urine culture  Status: None   Collection Time: 02/08/16  1:03 PM  Result Value Ref Range Status   Specimen Description URINE, RANDOM  Final   Special Requests NONE  Final   Culture NO GROWTH  Final   Report Status 02/10/2016 FINAL  Final  MRSA PCR Screening     Status: None   Collection Time: 02/08/16  1:15 PM  Result Value Ref Range Status   MRSA by PCR NEGATIVE NEGATIVE Final    Comment:        The GeneXpert MRSA Assay (FDA approved for NASAL specimens only), is one component of a comprehensive MRSA colonization surveillance program. It is not intended to diagnose MRSA infection nor to guide or monitor treatment for MRSA infections.       Radiology Studies: No results found.   Scheduled Meds: . enoxaparin (LOVENOX) injection  40 mg Subcutaneous Q24H  . feeding supplement  1 Container Oral TID BM  . loperamide  4 mg Oral QID  .  pantoprazole  40 mg Oral BID AC  . tigecycline (TYGACIL) IVPB  50 mg Intravenous Q12H  . tobramycin  7 mg/kg (Ideal) Intravenous Q24H   Continuous Infusions:    LOS: 10 days   Time Spent in minutes   30 minutes  Davison Ohms D.O. on 02/18/2016 at 11:43 AM  Between 7am to 7pm - Pager - [phone]  After 7pm go to [email or web address] - password [email or web address]  And look for the night coverage person covering for me after hours  [facility] Office  [phone]

## 2016-02-19 LAB — COMPREHENSIVE METABOLIC PANEL
ALBUMIN: 1.6 g/dL — AB (ref 3.5–5.0)
ALT: 18 U/L (ref 17–63)
ANION GAP: 8 (ref 5–15)
AST: 19 U/L (ref 15–41)
Alkaline Phosphatase: 123 U/L (ref 38–126)
BUN: 23 mg/dL — ABNORMAL HIGH (ref 6–20)
CHLORIDE: 105 mmol/L (ref 101–111)
CO2: 21 mmol/L — AB (ref 22–32)
Calcium: 7.3 mg/dL — ABNORMAL LOW (ref 8.9–10.3)
Creatinine, Ser: 0.63 mg/dL (ref 0.61–1.24)
GFR calc Af Amer: >60 mL/min (ref >60)
GFR calc non Af Amer: >60 mL/min (ref >60)
GLUCOSE: 78 mg/dL (ref 65–99)
POTASSIUM: 3.7 mmol/L (ref 3.5–5.1)
SODIUM: 134 mmol/L — AB (ref 135–145)
Total Bilirubin: 0.6 mg/dL (ref 0.3–1.2)
Total Protein: 4.8 g/dL — ABNORMAL LOW (ref 6.5–8.1)

## 2016-02-19 LAB — CBC
HEMATOCRIT: 23.1 % — AB (ref 39.0–52.0)
Hemoglobin: 7.7 g/dL — ABNORMAL LOW (ref 13.0–17.0)
MCH: 26.8 pg (ref 26.0–34.0)
MCHC: 33.3 g/dL (ref 30.0–36.0)
MCV: 80.5 fL (ref 78.0–100.0)
Platelets: 637 10*3/uL — ABNORMAL HIGH (ref 150–400)
RBC: 2.87 MIL/uL — AB (ref 4.22–5.81)
RDW: 16.2 % — ABNORMAL HIGH (ref 11.5–15.5)
WBC: 12.9 10*3/uL — AB (ref 4.0–10.5)

## 2016-02-19 LAB — MAGNESIUM: Magnesium: 2 mg/dL (ref 1.7–2.4)

## 2016-02-19 LAB — PHOSPHORUS: Phosphorus: 3.9 mg/dL (ref 2.5–4.6)

## 2016-02-19 MED ORDER — SODIUM CHLORIDE 0.9 % IV SOLN
510.0000 mg | Freq: Once | INTRAVENOUS | Status: AC
  Administered 2016-02-19: 510 mg via INTRAVENOUS
  Filled 2016-02-19: qty 17

## 2016-02-19 NOTE — Progress Notes (Signed)
Patient ID: [name], male   DOB: 1965-12-12, 50 y.o.   MRN: [id number] [facility] Surgery Progress Note:   7 Days Post-Op  Subjective: Mental status is clear.  Up with ostomy bag off and powder around the bud.  Says his system is too acidic Objective: Vital signs in last 24 hours: Temp:  [98.4 F (36.9 C)-98.7 F (37.1 C)] 98.5 F (36.9 C) (11/05 0607) Pulse Rate:  [60-64] 64 (11/05 0607) Resp:  [16-18] 18 (11/05 0607) BP: (114-121)/(61-66) 114/61 (11/05 0607) SpO2:  [99 %-100 %] 99 % (11/05 0607)  Intake/Output from previous day: 11/04 0701 - 11/05 0700 In: 212.3 [IV Piggyback:212.3] Out: 2150 [Urine:1150; Stool:1000] Intake/Output this shift: Total I/O In: -  Out: 500 [Urine:500]  Physical Exam: Work of breathing is normal.  Retention sutures in wound.  Ileostomy pink and adequate bud but it may be in his abdominal crease.   Lab Results:  Results for orders placed or performed during the hospital encounter of 02/08/16 (from the past 48 hour(s))  Comprehensive metabolic panel     Status: Abnormal   Collection Time: 02/18/16  5:00 AM  Result Value Ref Range   Sodium 134 (L) 135 - 145 mmol/L   Potassium 3.5 3.5 - 5.1 mmol/L   Chloride 105 101 - 111 mmol/L   CO2 24 22 - 32 mmol/L   Glucose, Bld 80 65 - 99 mg/dL   BUN 21 (H) 6 - 20 mg/dL   Creatinine, Ser 0.57 (L) 0.61 - 1.24 mg/dL   Calcium 7.3 (L) 8.9 - 10.3 mg/dL   Total Protein 4.8 (L) 6.5 - 8.1 g/dL   Albumin 1.5 (L) 3.5 - 5.0 g/dL   AST 19 15 - 41 U/L   ALT 19 17 - 63 U/L   Alkaline Phosphatase 119 38 - 126 U/L   Total Bilirubin 0.4 0.3 - 1.2 mg/dL   GFR calc non Af Amer >60 >60 mL/min   GFR calc Af Amer >60 >60 mL/min    Comment: (NOTE) The eGFR has been calculated using the CKD EPI equation. This calculation has not been validated in all clinical situations. eGFR's persistently <60 mL/min signify possible Chronic Kidney Disease.    Anion gap 5 5 - 15  Magnesium     Status: None   Collection Time:  02/18/16  5:00 AM  Result Value Ref Range   Magnesium 2.0 1.7 - 2.4 mg/dL  Phosphorus     Status: None   Collection Time: 02/18/16  5:00 AM  Result Value Ref Range   Phosphorus 3.6 2.5 - 4.6 mg/dL  CBC     Status: Abnormal   Collection Time: 02/18/16  5:00 AM  Result Value Ref Range   WBC 12.6 (H) 4.0 - 10.5 K/uL   RBC 2.69 (L) 4.22 - 5.81 MIL/uL   Hemoglobin 7.3 (L) 13.0 - 17.0 g/dL   HCT 21.7 (L) 39.0 - 52.0 %   MCV 80.7 78.0 - 100.0 fL   MCH 27.1 26.0 - 34.0 pg   MCHC 33.6 30.0 - 36.0 g/dL   RDW 15.9 (H) 11.5 - 15.5 %   Platelets 558 (H) 150 - 400 K/uL  Vitamin B12     Status: Abnormal   Collection Time: 02/18/16  3:15 PM  Result Value Ref Range   Vitamin B-12 1,445 (H) 180 - 914 pg/mL    Comment: (NOTE) This assay is not validated for testing neonatal or myeloproliferative syndrome specimens for Vitamin B12 levels.   Folate  Status: None   Collection Time: 02/18/16  3:15 PM  Result Value Ref Range   Folate 21.5 >5.9 ng/mL  Iron and TIBC     Status: Abnormal   Collection Time: 02/18/16  3:15 PM  Result Value Ref Range   Iron 11 (L) 45 - 182 ug/dL   TIBC 196 (L) 250 - 450 ug/dL   Saturation Ratios 6 (L) 17.9 - 39.5 %   UIBC 185 ug/dL  Ferritin     Status: None   Collection Time: 02/18/16  3:15 PM  Result Value Ref Range   Ferritin 257 24 - 336 ng/mL  Reticulocytes     Status: Abnormal   Collection Time: 02/18/16  3:15 PM  Result Value Ref Range   Retic Ct Pct 2.3 0.4 - 3.1 %   RBC. 3.24 (L) 4.22 - 5.81 MIL/uL   Retic Count, Manual 74.5 19.0 - 186.0 K/uL  Comprehensive metabolic panel     Status: Abnormal   Collection Time: 02/19/16  5:00 AM  Result Value Ref Range   Sodium 134 (L) 135 - 145 mmol/L   Potassium 3.7 3.5 - 5.1 mmol/L   Chloride 105 101 - 111 mmol/L   CO2 21 (L) 22 - 32 mmol/L   Glucose, Bld 78 65 - 99 mg/dL   BUN 23 (H) 6 - 20 mg/dL   Creatinine, Ser 0.63 0.61 - 1.24 mg/dL   Calcium 7.3 (L) 8.9 - 10.3 mg/dL   Total Protein 4.8 (L) 6.5 - 8.1  g/dL   Albumin 1.6 (L) 3.5 - 5.0 g/dL   AST 19 15 - 41 U/L   ALT 18 17 - 63 U/L   Alkaline Phosphatase 123 38 - 126 U/L   Total Bilirubin 0.6 0.3 - 1.2 mg/dL   GFR calc non Af Amer >60 >60 mL/min   GFR calc Af Amer >60 >60 mL/min    Comment: (NOTE) The eGFR has been calculated using the CKD EPI equation. This calculation has not been validated in all clinical situations. eGFR's persistently <60 mL/min signify possible Chronic Kidney Disease.    Anion gap 8 5 - 15  Magnesium     Status: None   Collection Time: 02/19/16  5:00 AM  Result Value Ref Range   Magnesium 2.0 1.7 - 2.4 mg/dL  Phosphorus     Status: None   Collection Time: 02/19/16  5:00 AM  Result Value Ref Range   Phosphorus 3.9 2.5 - 4.6 mg/dL  CBC     Status: Abnormal   Collection Time: 02/19/16  5:00 AM  Result Value Ref Range   WBC 12.9 (H) 4.0 - 10.5 K/uL   RBC 2.87 (L) 4.22 - 5.81 MIL/uL   Hemoglobin 7.7 (L) 13.0 - 17.0 g/dL   HCT 23.1 (L) 39.0 - 52.0 %   MCV 80.5 78.0 - 100.0 fL   MCH 26.8 26.0 - 34.0 pg   MCHC 33.3 30.0 - 36.0 g/dL   RDW 16.2 (H) 11.5 - 15.5 %   Platelets 637 (H) 150 - 400 K/uL    Radiology/Results: No results found.  Anti-infectives: Anti-infectives    Start     Dose/Rate Route Frequency Ordered Stop   02/18/16 1400  tobramycin (NEBCIN) 490 mg in dextrose 5 % 100 mL IVPB     7 mg/kg  70.7 kg (Ideal) 112.3 mL/hr over 60 Minutes Intravenous Every 24 hours 02/18/16 1041 02/20/16 2359   02/15/16 0031  tigecycline (TYGACIL) 50 mg in sodium chloride 0.9 % 100 mL  IVPB     50 mg 200 mL/hr over 30 Minutes Intravenous Every 12 hours 02/14/16 1232 02/20/16 2359   02/14/16 1400  tobramycin (NEBCIN) 510 mg in dextrose 5 % 100 mL IVPB  Status:  Discontinued     7 mg/kg  72.9 kg 112.8 mL/hr over 60 Minutes Intravenous Every 24 hours 02/14/16 1241 02/18/16 1041   02/14/16 1245  tigecycline (TYGACIL) 100 mg in sodium chloride 0.9 % 100 mL IVPB     100 mg 200 mL/hr over 30 Minutes Intravenous   Once 02/14/16 1232 02/14/16 1342   02/13/16 1200  cefTRIAXone (ROCEPHIN) 2 g in dextrose 5 % 50 mL IVPB  Status:  Discontinued     2 g 100 mL/hr over 30 Minutes Intravenous Every 24 hours 02/13/16 0857 02/14/16 1205   02/13/16 1200  tobramycin (NEBCIN) 530 mg in dextrose 5 % 100 mL IVPB  Status:  Discontinued     7 mg/kg  75.5 kg (Adjusted) 113.3 mL/hr over 60 Minutes Intravenous Every 24 hours 02/13/16 1123 02/14/16 1234   02/10/16 1500  anidulafungin (ERAXIS) 100 mg in sodium chloride 0.9 % 100 mL IVPB     100 mg over 90 Minutes Intravenous Every 24 hours 02/09/16 0804 02/15/16 1556   02/10/16 1500  vancomycin (VANCOCIN) IVPB 1000 mg/200 mL premix  Status:  Discontinued     1,000 mg 200 mL/hr over 60 Minutes Intravenous Every 8 hours 02/10/16 1239 02/13/16 0856   02/10/16 1500  imipenem-cilastatin (PRIMAXIN) 1,000 mg in sodium chloride 0.9 % 250 mL IVPB  Status:  Discontinued     1,000 mg 250 mL/hr over 60 Minutes Intravenous Every 8 hours 02/10/16 1239 02/13/16 0856   02/09/16 1000  imipenem-cilastatin (PRIMAXIN) 1,000 mg in sodium chloride 0.9 % 250 mL IVPB  Status:  Discontinued     1,000 mg 250 mL/hr over 60 Minutes Intravenous Every 8 hours 02/09/16 0812 02/10/16 1239   02/09/16 1000  vancomycin (VANCOCIN) IVPB 1000 mg/200 mL premix  Status:  Discontinued     1,000 mg 200 mL/hr over 60 Minutes Intravenous Every 8 hours 02/09/16 0812 02/10/16 1239   02/09/16 0900  anidulafungin (ERAXIS) 200 mg in sodium chloride 0.9 % 200 mL IVPB     200 mg over 180 Minutes Intravenous  Once 02/09/16 0804 02/09/16 1209   02/09/16 0830  anidulafungin (ERAXIS) 100 mg in sodium chloride 0.9 % 100 mL IVPB  Status:  Discontinued     100 mg over 90 Minutes Intravenous Every 24 hours 02/08/16 0811 02/09/16 0804   02/08/16 1800  vancomycin (VANCOCIN) IVPB 750 mg/150 ml premix  Status:  Discontinued     750 mg 150 mL/hr over 60 Minutes Intravenous Every 8 hours 02/08/16 0847 02/09/16 0812   02/08/16 1400   piperacillin-tazobactam (ZOSYN) IVPB 3.375 g  Status:  Discontinued     3.375 g 12.5 mL/hr over 240 Minutes Intravenous Every 8 hours 02/08/16 0432 02/08/16 0802   02/08/16 0900  imipenem-cilastatin (PRIMAXIN) 500 mg in sodium chloride 0.9 % 100 mL IVPB  Status:  Discontinued     500 mg 200 mL/hr over 30 Minutes Intravenous Every 6 hours 02/08/16 0818 02/09/16 0812   02/08/16 0830  ertapenem (INVANZ) 1 g in sodium chloride 0.9 % 50 mL IVPB  Status:  Discontinued     1 g 100 mL/hr over 30 Minutes Intravenous Every 24 hours 02/08/16 0756 02/08/16 0812   02/08/16 0830  anidulafungin (ERAXIS) 200 mg in sodium chloride 0.9 % 200 mL  IVPB  Status:  Discontinued     200 mg over 180 Minutes Intravenous  Once 02/08/16 0811 02/09/16 1457   02/08/16 0830  vancomycin (VANCOCIN) 1,500 mg in sodium chloride 0.9 % 500 mL IVPB  Status:  Discontinued     1,500 mg 250 mL/hr over 120 Minutes Intravenous  Once 02/08/16 0815 02/09/16 1355   02/08/16 0815  vancomycin (VANCOCIN) 1,500 mg in sodium chloride 0.9 % 500 mL IVPB  Status:  Discontinued     1,500 mg 250 mL/hr over 120 Minutes Intravenous Every 12 hours 02/08/16 0811 02/08/16 0812   02/08/16 0430  piperacillin-tazobactam (ZOSYN) IVPB 3.375 g     3.375 g 100 mL/hr over 30 Minutes Intravenous  Once 02/08/16 0419 02/08/16 0532      Assessment/Plan: Problem List: Patient Active Problem List   Diagnosis Date Noted  . Ischemic bowel disease ([place])   . Bowel perforation ([place])   . Abdominal abscess ([place])   . Pleural effusion   . Acute kidney injury ([place])   . Sepsis due to Escherichia coli ([place])   . Sepsis ([place])   . Abdominal infection ([place]) 02/08/2016  . Peritonitis ([place])   . Small bowel perforation ([place]) 01/27/2016    Will get ostomy care consult. 7 Days Post-Op    LOS: 11 days   Matt B. Hassell Done, MD, [facility] [phone] beeper [phone]  02/19/2016 1:20 PM

## 2016-02-19 NOTE — Progress Notes (Addendum)
PROGRESS NOTE    [name]  [id number] DOB: October 23, 1965 DOA: 02/08/2016 PCP: No primary care provider on file.   Chief Complaint  Patient presents with  . Abdominal Pain    Brief Narrative:  HPI on 02/08/2016 by Ms. [name], PA (Surgery) 50 y/o male with PMH back pain and small bowel perforation s/p ileocecectomy 01/28/16 who presented to [facility] with abdominal pain. He was previously discharged from hospital on 02/02/16. Patient states his abdominal pain started late last night/early this morning and is described as sharp and non-radiating. Rates pain as 10/10. Pain is worse with movement and inspiration. No relief with PO pain medication. Associated symptoms include nausea, chills, and SOB. Patient has been having daily BMs. Denies fever, CP, vomiting, diarrhea, melena, hematochezia, urinary symptoms, and weakness. He denies regular medication use.  Interim history  He required re-admit as he developed septic shock, ischemic bowel, and an abscess. 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 -If Hemoglobin continues to drop <7, will transfuse -Anemia panel shows Iron 11, ferritin 257  -Will give dose of feraheme  DVT Prophylaxis  Lovenox (will switch to SCDs if Hb continues to drop)  Code  Status: Full  Family Communication: None at bedside  Disposition Plan: Admitted. Discharge pending surgery recommendations. Antibiotics through 11/6  Consultants PCCM General surgery Infectious disease  Procedures  10/25 - readmitted - Intubated and central access obtained - Exploratory surgery, wound vac placement and ileocectomy  10/27 - Exploratory surgery-peritonitis, distal ileum ischemia , s/p resection w/ ileostomy  10/29 - Exploratory laparotomy with removal of vacuum pack dressing and closure of abdominal wall  10/30 - Extubated 11/1 - [facility] assumed care  Antibiotics   Anti-infectives    Start     Dose/Rate Route Frequency Ordered Stop   02/18/16 1400  tobramycin (NEBCIN) 490 mg in dextrose 5 % 100 mL IVPB     7 mg/kg  70.7 kg (Ideal) 112.3 mL/hr over 60 Minutes Intravenous Every 24 hours 02/18/16 1041 02/20/16 2359   02/15/16 0031  tigecycline (TYGACIL) 50 mg in sodium chloride 0.9 % 100 mL IVPB     50 mg 200 mL/hr over 30 Minutes Intravenous Every 12 hours 02/14/16 1232 02/20/16 2359   02/14/16 1400  tobramycin (NEBCIN) 510 mg in dextrose 5 % 100 mL IVPB  Status:  Discontinued     7 mg/kg  72.9 kg 112.8 mL/hr over 60 Minutes Intravenous Every 24 hours 02/14/16 1241 02/18/16 1041   02/14/16 1245  tigecycline (TYGACIL) 100 mg in sodium chloride 0.9 % 100 mL IVPB     100 mg 200 mL/hr over 30 Minutes Intravenous  Once 02/14/16 1232 02/14/16 1342   02/13/16 1200  cefTRIAXone (ROCEPHIN) 2 g in dextrose 5 % 50 mL IVPB  Status:  Discontinued     2 g 100 mL/hr over 30 Minutes Intravenous Every 24 hours 02/13/16 0857 02/14/16  1205   02/13/16 1200  tobramycin (NEBCIN) 530 mg in dextrose 5 % 100 mL IVPB  Status:  Discontinued     7 mg/kg  75.5 kg (Adjusted) 113.3 mL/hr over 60 Minutes Intravenous Every 24 hours 02/13/16 1123 02/14/16 1234   02/10/16 1500  anidulafungin (ERAXIS) 100 mg in sodium chloride 0.9 % 100 mL IVPB     100 mg over 90 Minutes Intravenous Every 24 hours  02/09/16 0804 02/15/16 1556   02/10/16 1500  vancomycin (VANCOCIN) IVPB 1000 mg/200 mL premix  Status:  Discontinued     1,000 mg 200 mL/hr over 60 Minutes Intravenous Every 8 hours 02/10/16 1239 02/13/16 0856   02/10/16 1500  imipenem-cilastatin (PRIMAXIN) 1,000 mg in sodium chloride 0.9 % 250 mL IVPB  Status:  Discontinued     1,000 mg 250 mL/hr over 60 Minutes Intravenous Every 8 hours 02/10/16 1239 02/13/16 0856   02/09/16 1000  imipenem-cilastatin (PRIMAXIN) 1,000 mg in sodium chloride 0.9 % 250 mL IVPB  Status:  Discontinued     1,000 mg 250 mL/hr over 60 Minutes Intravenous Every 8 hours 02/09/16 0812 02/10/16 1239   02/09/16 1000  vancomycin (VANCOCIN) IVPB 1000 mg/200 mL premix  Status:  Discontinued     1,000 mg 200 mL/hr over 60 Minutes Intravenous Every 8 hours 02/09/16 0812 02/10/16 1239   02/09/16 0900  anidulafungin (ERAXIS) 200 mg in sodium chloride 0.9 % 200 mL IVPB     200 mg over 180 Minutes Intravenous  Once 02/09/16 0804 02/09/16 1209   02/09/16 0830  anidulafungin (ERAXIS) 100 mg in sodium chloride 0.9 % 100 mL IVPB  Status:  Discontinued     100 mg over 90 Minutes Intravenous Every 24 hours 02/08/16 0811 02/09/16 0804   02/08/16 1800  vancomycin (VANCOCIN) IVPB 750 mg/150 ml premix  Status:  Discontinued     750 mg 150 mL/hr over 60 Minutes Intravenous Every 8 hours 02/08/16 0847 02/09/16 0812   02/08/16 1400  piperacillin-tazobactam (ZOSYN) IVPB 3.375 g  Status:  Discontinued     3.375 g 12.5 mL/hr over 240 Minutes Intravenous Every 8 hours 02/08/16 0432 02/08/16 0802   02/08/16 0900  imipenem-cilastatin (PRIMAXIN) 500 mg in sodium chloride 0.9 % 100 mL IVPB  Status:  Discontinued     500 mg 200 mL/hr over 30 Minutes Intravenous Every 6 hours 02/08/16 0818 02/09/16 0812   02/08/16 0830  ertapenem (INVANZ) 1 g in sodium chloride 0.9 % 50 mL IVPB  Status:  Discontinued     1 g 100 mL/hr over 30 Minutes Intravenous Every 24 hours 02/08/16 0756 02/08/16 0812    02/08/16 0830  anidulafungin (ERAXIS) 200 mg in sodium chloride 0.9 % 200 mL IVPB  Status:  Discontinued     200 mg over 180 Minutes Intravenous  Once 02/08/16 0811 02/09/16 1457   02/08/16 0830  vancomycin (VANCOCIN) 1,500 mg in sodium chloride 0.9 % 500 mL IVPB  Status:  Discontinued     1,500 mg 250 mL/hr over 120 Minutes Intravenous  Once 02/08/16 0815 02/09/16 1355   02/08/16 0815  vancomycin (VANCOCIN) 1,500 mg in sodium chloride 0.9 % 500 mL IVPB  Status:  Discontinued     1,500 mg 250 mL/hr over 120 Minutes Intravenous Every 12 hours 02/08/16 0811 02/08/16 0812   02/08/16 0430  piperacillin-tazobactam (ZOSYN) IVPB 3.375 g     3.375 g 100 mL/hr over 30 Minutes Intravenous  Once 02/08/16 0419 02/08/16 0532      Subjective:  McComb seen and examined today.  Continues to complain about the leakage around his ileostomy and how his skin feels on fire. Denies chest pain, shortness of breath, nausea, vomiting, dizziness, headache.   Objective:   Vitals:   02/18/16 0608 02/18/16 1417 02/18/16 2155 02/19/16 0607  BP: 124/64 121/66 117/61 114/61  Pulse: 60 60 63 64  Resp: 20 16 18 18   Temp: 98.2 F (36.8 C) 98.7 F (37.1 C) 98.4 F (36.9 C) 98.5 F (36.9 C)  TempSrc: Oral Oral Oral Oral  SpO2: 100% 100% 100% 99%  Weight:      Height:        Intake/Output Summary (Last 24 hours) at 02/19/16 1132 Last data filed at 02/19/16 0901  Gross per 24 hour  Intake           212.25 ml  Output             2350 ml  Net         -2137.75 ml   Filed Weights   02/13/16 0350 02/14/16 0400 02/16/16 0400  Weight: 82.6 kg (182 lb 1.6 oz) 72.9 kg (160 lb 11.5 oz) 55.9 kg (123 lb 3.8 oz)    Exam  General: Well developed, well nourished, NAD, appears stated age  57: NCAT,mucous membranes moist.   Cardiovascular: S1 S2 auscultated, no murmurs, RRR  Respiratory: Clear to auscultation bilaterally with equal chest rise  Abdomen: Soft, nontender, nondistended, + bowel sounds,  ileostomy (bag- leakage)  Extremities: warm dry without cyanosis clubbing or edema  Neuro: AAOx3, nonfocal   Psych: Normal affect and demeanor, pleasant   Data Reviewed: I have personally reviewed following labs and imaging studies  CBC:  Recent Labs Lab 02/13/16 0601  02/14/16 1736 02/15/16 0250 02/16/16 0420 02/17/16 0435 02/18/16 0500 02/19/16 0500  WBC 13.7*  < > 16.5* 16.6* 15.1* 16.5* 12.6* 12.9*  NEUTROABS 10.0*  --  13.0*  --   --   --   --   --   HGB 10.0*  < > 7.6* 7.7* 7.7* 8.2* 7.3* 7.7*  HCT 29.6*  < > 22.7* 22.3* 22.7* 24.5* 21.7* 23.1*  MCV 80.9  < > 80.2 79.9 79.9 80.1 80.7 80.5  PLT 248  < > 357 390 494* 574* 558* 637*  < > = values in this interval not displayed. Basic Metabolic Panel:  Recent Labs Lab 02/14/16 0345  02/15/16 0250 02/16/16 0420 02/16/16 1632 02/17/16 0435 02/18/16 0500 02/19/16 0500  NA 134*  < > 136 136  --  135 134* 134*  K 2.6*  < > 3.3* 3.2* 3.8 3.7 3.5 3.7  CL 96*  < > 97* 100*  --  103 105 105  CO2 32  < > 31 28  --  26 24 21*  GLUCOSE 129*  < > 87 80  --  83 80 78  BUN 8  < > 13 20  --  20 21* 23*  CREATININE 0.44*  < > 0.49* 0.53*  --  0.54* 0.57* 0.63  CALCIUM 7.3*  < > 7.6* 7.4*  --  7.6* 7.3* 7.3*  MG 2.0  --   --   --  2.0 2.1 2.0 2.0  PHOS 3.2  --   --   --  3.8 3.4 3.6 3.9  < > = values in this interval not displayed. GFR: Estimated Creatinine Clearance: 88.3 mL/min (by C-G formula based on SCr of 0.63 mg/dL). Liver Function Tests:  Recent Labs Lab 02/13/16 0601  02/16/16 0420 02/17/16 0435 02/18/16 0500 02/19/16 0500  AST 24 25 22 19 19   ALT 17 20 22 19 18   ALKPHOS 139* 141* 137* 119 123  BILITOT 0.5 0.4 0.6 0.4 0.6  PROT 3.8* 4.9* 5.0* 4.8* 4.8*  ALBUMIN 1.1* 1.5* 1.6* 1.5* 1.6*   No results for input(s): LIPASE, AMYLASE in the last 168 hours. No results for input(s): AMMONIA in the last 168 hours. Coagulation Profile: No results for input(s): INR, PROTIME in the last 168 hours. Cardiac  Enzymes: No results for input(s): CKTOTAL, CKMB, CKMBINDEX, TROPONINI in the last 168 hours. BNP (last 3 results) No results for input(s): PROBNP in the last 8760 hours. HbA1C: No results for input(s): HGBA1C in the last 72 hours. CBG:  Recent Labs Lab 02/15/16 0451 02/15/16 0828 02/15/16 1122 02/15/16 1554 02/16/16 0728  GLUCAP 82 77 80 107* 79   Lipid Profile: No results for input(s): CHOL, HDL, LDLCALC, TRIG, CHOLHDL, LDLDIRECT in the last 72 hours. Thyroid Function Tests: No results for input(s): TSH, T4TOTAL, FREET4, T3FREE, THYROIDAB in the last 72 hours. Anemia Panel:  Recent Labs  02/18/16 1515  VITAMINB12 1,445*  FOLATE 21.5  FERRITIN 257  TIBC 196*  IRON 11*  RETICCTPCT 2.3   Urine analysis:    Component Value Date/Time   COLORURINE AMBER (A) 02/08/2016 1303   APPEARANCEUR CLOUDY (A) 02/08/2016 1303   LABSPEC >1.046 (H) 02/08/2016 1303   PHURINE 6.0 02/08/2016 1303   GLUCOSEU NEGATIVE 02/08/2016 1303   HGBUR TRACE (A) 02/08/2016 1303   BILIRUBINUR NEGATIVE 02/08/2016 1303   KETONESUR NEGATIVE 02/08/2016 1303   PROTEINUR 30 (A) 02/08/2016 1303   UROBILINOGEN 1.0 07/28/2007 1539   NITRITE NEGATIVE 02/08/2016 1303   LEUKOCYTESUR NEGATIVE 02/08/2016 1303   Sepsis Labs: @LABRCNTIP (procalcitonin:4,lacticidven:4)  ) No results found for this or any previous visit (from the past 240 hour(s)).    Radiology Studies: No results found.   Scheduled Meds: . enoxaparin (LOVENOX) injection  40 mg Subcutaneous Q24H  . feeding supplement  1 Container Oral TID BM  . loperamide  4 mg Oral QID  . pantoprazole  40 mg Oral BID AC  . psyllium  1 packet Oral BID  . tigecycline (TYGACIL) IVPB  50 mg Intravenous Q12H  . tobramycin  7 mg/kg (Ideal) Intravenous Q24H   Continuous Infusions:   LOS: 11 days   Time Spent in minutes   30 minutes  Shunte Senseney D.O. on 02/19/2016 at 11:32 AM  Between 7am to 7pm - Pager - [phone]  After 7pm go to  [email or web address] - password [email or web address]  And look for the night coverage person covering for me after hours  [facility] Office  [phone]

## 2016-02-20 LAB — MAGNESIUM: MAGNESIUM: 2 mg/dL (ref 1.7–2.4)

## 2016-02-20 LAB — COMPREHENSIVE METABOLIC PANEL
ALBUMIN: 1.6 g/dL — AB (ref 3.5–5.0)
ALT: 18 U/L (ref 17–63)
AST: 21 U/L (ref 15–41)
Alkaline Phosphatase: 127 U/L — ABNORMAL HIGH (ref 38–126)
Anion gap: 7 (ref 5–15)
BILIRUBIN TOTAL: 0.6 mg/dL (ref 0.3–1.2)
BUN: 21 mg/dL — AB (ref 6–20)
CHLORIDE: 104 mmol/L (ref 101–111)
CO2: 22 mmol/L (ref 22–32)
CREATININE: 0.7 mg/dL (ref 0.61–1.24)
Calcium: 7.5 mg/dL — ABNORMAL LOW (ref 8.9–10.3)
GFR calc Af Amer: >60 mL/min (ref >60)
GLUCOSE: 88 mg/dL (ref 65–99)
POTASSIUM: 3.4 mmol/L — AB (ref 3.5–5.1)
Sodium: 133 mmol/L — ABNORMAL LOW (ref 135–145)
TOTAL PROTEIN: 5.1 g/dL — AB (ref 6.5–8.1)

## 2016-02-20 LAB — CBC
HCT: 21 % — ABNORMAL LOW (ref 39.0–52.0)
HEMOGLOBIN: 7 g/dL — AB (ref 13.0–17.0)
MCH: 26.4 pg (ref 26.0–34.0)
MCHC: 33.3 g/dL (ref 30.0–36.0)
MCV: 79.2 fL (ref 78.0–100.0)
Platelets: 343 10*3/uL (ref 150–400)
RBC: 2.65 MIL/uL — AB (ref 4.22–5.81)
RDW: 16.4 % — ABNORMAL HIGH (ref 11.5–15.5)
WBC: 8 10*3/uL (ref 4.0–10.5)

## 2016-02-20 LAB — PHOSPHORUS: Phosphorus: 4 mg/dL (ref 2.5–4.6)

## 2016-02-20 LAB — PREPARE RBC (CROSSMATCH)

## 2016-02-20 MED ORDER — ENSURE ENLIVE PO LIQD
237.0000 mL | Freq: Two times a day (BID) | ORAL | Status: DC
  Administered 2016-02-20 – 2016-03-04 (×24): 237 mL via ORAL
  Filled 2016-02-20 (×2): qty 237

## 2016-02-20 MED ORDER — POTASSIUM CHLORIDE CRYS ER 20 MEQ PO TBCR
40.0000 meq | EXTENDED_RELEASE_TABLET | Freq: Once | ORAL | Status: AC
  Administered 2016-02-20: 40 meq via ORAL
  Filled 2016-02-20: qty 2

## 2016-02-20 MED ORDER — SODIUM CHLORIDE 0.9 % IV SOLN
Freq: Once | INTRAVENOUS | Status: AC
  Administered 2016-03-01: 13:00:00 via INTRAVENOUS

## 2016-02-20 NOTE — Progress Notes (Signed)
PROGRESS NOTE    [name]  [id number] DOB: 08-04-1965 DOA: 02/08/2016 PCP: No primary care provider on file.   Chief Complaint  Patient presents with  . Abdominal Pain    Brief Narrative:  HPI on 02/08/2016 by Ms. [name], PA (Surgery) 50 y/o male with PMH back pain and small bowel perforation s/p ileocecectomy 01/28/16 who presented to [facility] with abdominal pain. He was previously discharged from hospital on 02/02/16. Patient states his abdominal pain started late last night/early this morning and is described as sharp and non-radiating. Rates pain as 10/10. Pain is worse with movement and inspiration. No relief with PO pain medication. Associated symptoms include nausea, chills, and SOB. Patient has been having daily BMs. Denies fever, CP, vomiting, diarrhea, melena, hematochezia, urinary symptoms, and weakness. He denies regular medication use.  Interim history  He required re-admit as he developed septic shock, ischemic bowel, and an abscess. 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 -Ongoing care per general surgery -Antibiotic management per ID: Tigecycline and tobramycin through today 02/20/2016 -Drainage/leakage noted from ileostomy site -Wound care/ostomy reconsulted  L Pleural Effusion  -Small on f/u CXR -follow w/o intervention for now  -No complaints of shortness of breath  Septic shock -Resolved  Lower extremity edema -resolving  Hyopkalemia -Replace and continue to monitor  -Magnesium 2.1  AKI -Resolved  Normocytic Anemia -hemoglobin currently 7.0 -Continue to monitor CBC -Possibly due to recent surgery? -Will tranfuse 1uPRBCs today -Anemia panel shows Iron 11, ferritin 257  -Given dose of feraheme -Discontinue Lovenox  DVT Prophylaxis  SCDs  Code Status: Full  Family Communication: None at  bedside  Disposition Plan: Admitted. Discharge pending surgery recommendations. Antibiotics through 11/6  Consultants PCCM General surgery Infectious disease  Procedures  10/25 - readmitted - Intubated and central access obtained - Exploratory surgery, wound vac placement and ileocectomy  10/27 - Exploratory surgery-peritonitis, distal ileum ischemia , s/p resection w/ ileostomy  10/29 - Exploratory laparotomy with removal of vacuum pack dressing and closure of abdominal wall  10/30 - Extubated 11/1 - [facility] assumed care  Antibiotics   Anti-infectives    Start     Dose/Rate Route Frequency Ordered Stop   02/18/16 1400  tobramycin (NEBCIN) 490 mg in dextrose 5 % 100 mL IVPB     7 mg/kg  70.7 kg (Ideal) 112.3 mL/hr over 60 Minutes Intravenous Every 24 hours 02/18/16 1041 02/20/16 2359   02/15/16 0031  tigecycline (TYGACIL) 50 mg in sodium chloride 0.9 % 100 mL IVPB     50 mg 200 mL/hr over 30 Minutes Intravenous Every 12 hours 02/14/16 1232 02/20/16 2359   02/14/16 1400  tobramycin (NEBCIN) 510 mg in dextrose 5 % 100 mL IVPB  Status:  Discontinued     7 mg/kg  72.9 kg 112.8 mL/hr over 60 Minutes Intravenous Every 24 hours 02/14/16 1241 02/18/16 1041   02/14/16 1245  tigecycline (TYGACIL) 100 mg in sodium chloride 0.9 % 100 mL IVPB     100 mg 200 mL/hr over 30 Minutes Intravenous  Once 02/14/16 1232 02/14/16 1342   02/13/16 1200  cefTRIAXone (ROCEPHIN) 2 g in dextrose 5 % 50 mL IVPB  Status:  Discontinued     2 g 100 mL/hr over 30 Minutes Intravenous Every 24 hours 02/13/16 0857 02/14/16 1205   02/13/16 1200  tobramycin (NEBCIN) 530 mg in  dextrose 5 % 100 mL IVPB  Status:  Discontinued     7 mg/kg  75.5 kg (Adjusted) 113.3 mL/hr over 60 Minutes Intravenous Every 24 hours 02/13/16 1123 02/14/16 1234   02/10/16 1500  anidulafungin (ERAXIS) 100 mg in sodium chloride 0.9 % 100 mL IVPB     100 mg over 90 Minutes Intravenous Every 24 hours 02/09/16 0804 02/15/16 1556   02/10/16 1500   vancomycin (VANCOCIN) IVPB 1000 mg/200 mL premix  Status:  Discontinued     1,000 mg 200 mL/hr over 60 Minutes Intravenous Every 8 hours 02/10/16 1239 02/13/16 0856   02/10/16 1500  imipenem-cilastatin (PRIMAXIN) 1,000 mg in sodium chloride 0.9 % 250 mL IVPB  Status:  Discontinued     1,000 mg 250 mL/hr over 60 Minutes Intravenous Every 8 hours 02/10/16 1239 02/13/16 0856   02/09/16 1000  imipenem-cilastatin (PRIMAXIN) 1,000 mg in sodium chloride 0.9 % 250 mL IVPB  Status:  Discontinued     1,000 mg 250 mL/hr over 60 Minutes Intravenous Every 8 hours 02/09/16 0812 02/10/16 1239   02/09/16 1000  vancomycin (VANCOCIN) IVPB 1000 mg/200 mL premix  Status:  Discontinued     1,000 mg 200 mL/hr over 60 Minutes Intravenous Every 8 hours 02/09/16 0812 02/10/16 1239   02/09/16 0900  anidulafungin (ERAXIS) 200 mg in sodium chloride 0.9 % 200 mL IVPB     200 mg over 180 Minutes Intravenous  Once 02/09/16 0804 02/09/16 1209   02/09/16 0830  anidulafungin (ERAXIS) 100 mg in sodium chloride 0.9 % 100 mL IVPB  Status:  Discontinued     100 mg over 90 Minutes Intravenous Every 24 hours 02/08/16 0811 02/09/16 0804   02/08/16 1800  vancomycin (VANCOCIN) IVPB 750 mg/150 ml premix  Status:  Discontinued     750 mg 150 mL/hr over 60 Minutes Intravenous Every 8 hours 02/08/16 0847 02/09/16 0812   02/08/16 1400  piperacillin-tazobactam (ZOSYN) IVPB 3.375 g  Status:  Discontinued     3.375 g 12.5 mL/hr over 240 Minutes Intravenous Every 8 hours 02/08/16 0432 02/08/16 0802   02/08/16 0900  imipenem-cilastatin (PRIMAXIN) 500 mg in sodium chloride 0.9 % 100 mL IVPB  Status:  Discontinued     500 mg 200 mL/hr over 30 Minutes Intravenous Every 6 hours 02/08/16 0818 02/09/16 0812   02/08/16 0830  ertapenem (INVANZ) 1 g in sodium chloride 0.9 % 50 mL IVPB  Status:  Discontinued     1 g 100 mL/hr over 30 Minutes Intravenous Every 24 hours 02/08/16 0756 02/08/16 0812   02/08/16 0830  anidulafungin (ERAXIS) 200 mg in  sodium chloride 0.9 % 200 mL IVPB  Status:  Discontinued     200 mg over 180 Minutes Intravenous  Once 02/08/16 0811 02/09/16 1457   02/08/16 0830  vancomycin (VANCOCIN) 1,500 mg in sodium chloride 0.9 % 500 mL IVPB  Status:  Discontinued     1,500 mg 250 mL/hr over 120 Minutes Intravenous  Once 02/08/16 0815 02/09/16 1355   02/08/16 0815  vancomycin (VANCOCIN) 1,500 mg in sodium chloride 0.9 % 500 mL IVPB  Status:  Discontinued     1,500 mg 250 mL/hr over 120 Minutes Intravenous Every 12 hours 02/08/16 0811 02/08/16 0812   02/08/16 0430  piperacillin-tazobactam (ZOSYN) IVPB 3.375 g     3.375 g 100 mL/hr over 30 Minutes Intravenous  Once 02/08/16 0419 02/08/16 0532      Subjective:   [name] seen and examined today.  Continues to complain  about the leakage around his ileostomy but feels it has improved since yesterday. Today wants a banana and mild. Denies further complaints. Denies chest pain, shortness of breath, nausea, vomiting, dizziness, headache.   Objective:   Vitals:   02/19/16 0607 02/19/16 1735 02/19/16 2117 02/20/16 0642  BP: 114/61 123/67 100/60 112/65  Pulse: 64 74 67 69  Resp: 18 18 18 18   Temp: 98.5 F (36.9 C) 98.4 F (36.9 C) 98.1 F (36.7 C) 98.4 F (36.9 C)  TempSrc: Oral Oral Oral Oral  SpO2: 99% 99% 99% 97%  Weight:      Height:        Intake/Output Summary (Last 24 hours) at 02/20/16 1037 Last data filed at 02/20/16 0901  Gross per 24 hour  Intake           612.25 ml  Output             2000 ml  Net         -1387.75 ml   Filed Weights   02/13/16 0350 02/14/16 0400 02/16/16 0400  Weight: 82.6 kg (182 lb 1.6 oz) 72.9 kg (160 lb 11.5 oz) 55.9 kg (123 lb 3.8 oz)    Exam  General: Well developed, well nourished, NAD  HEENT: NCAT,mucous membranes moist.   Cardiovascular: S1 S2 auscultated, no murmurs, RRR  Respiratory: Clear to auscultation bilaterally with equal chest rise  Abdomen: Soft, nontender, nondistended, + bowel sounds,  ileostomy (bag- leakage)  Extremities: warm dry without cyanosis clubbing or edema  Neuro: AAOx3, nonfocal   Psych: Normal affect and demeanor, pleasant   Data Reviewed: I have personally reviewed following labs and imaging studies  CBC:  Recent Labs Lab 02/14/16 1736  02/16/16 0420 02/17/16 0435 02/18/16 0500 02/19/16 0500 02/20/16 0534  WBC 16.5*  < > 15.1* 16.5* 12.6* 12.9* 8.0  NEUTROABS 13.0*  --   --   --   --   --   --   HGB 7.6*  < > 7.7* 8.2* 7.3* 7.7* 7.0*  HCT 22.7*  < > 22.7* 24.5* 21.7* 23.1* 21.0*  MCV 80.2  < > 79.9 80.1 80.7 80.5 79.2  PLT 357  < > 494* 574* 558* 637* 343  < > = values in this interval not displayed. Basic Metabolic Panel:  Recent Labs Lab 02/16/16 0420 02/16/16 1632 02/17/16 0435 02/18/16 0500 02/19/16 0500 02/20/16 0534  NA 136  --  135 134* 134* 133*  K 3.2* 3.8 3.7 3.5 3.7 3.4*  CL 100*  --  103 105 105 104  CO2 28  --  26 24 21* 22  GLUCOSE 80  --  83 80 78 88  BUN 20  --  20 21* 23* 21*  CREATININE 0.53*  --  0.54* 0.57* 0.63 0.70  CALCIUM 7.4*  --  7.6* 7.3* 7.3* 7.5*  MG  --  2.0 2.1 2.0 2.0 2.0  PHOS  --  3.8 3.4 3.6 3.9 4.0   GFR: Estimated Creatinine Clearance: 88.3 mL/min (by C-G formula based on SCr of 0.7 mg/dL). Liver Function Tests:  Recent Labs Lab 02/16/16 0420 02/17/16 0435 02/18/16 0500 02/19/16 0500 02/20/16 0534  AST 25 22 19 19 21   ALT 20 22 19 18 18   ALKPHOS 141* 137* 119 123 127*  BILITOT 0.4 0.6 0.4 0.6 0.6  PROT 4.9* 5.0* 4.8* 4.8* 5.1*  ALBUMIN 1.5* 1.6* 1.5* 1.6* 1.6*   No results for input(s): LIPASE, AMYLASE in the last 168 hours. No results for input(s):  AMMONIA in the last 168 hours. Coagulation Profile: No results for input(s): INR, PROTIME in the last 168 hours. Cardiac Enzymes: No results for input(s): CKTOTAL, CKMB, CKMBINDEX, TROPONINI in the last 168 hours. BNP (last 3 results) No results for input(s): PROBNP in the last 8760 hours. HbA1C: No results for input(s): HGBA1C  in the last 72 hours. CBG:  Recent Labs Lab 02/15/16 0451 02/15/16 0828 02/15/16 1122 02/15/16 1554 02/16/16 0728  GLUCAP 82 77 80 107* 79   Lipid Profile: No results for input(s): CHOL, HDL, LDLCALC, TRIG, CHOLHDL, LDLDIRECT in the last 72 hours. Thyroid Function Tests: No results for input(s): TSH, T4TOTAL, FREET4, T3FREE, THYROIDAB in the last 72 hours. Anemia Panel:  Recent Labs  02/18/16 1515  VITAMINB12 1,445*  FOLATE 21.5  FERRITIN 257  TIBC 196*  IRON 11*  RETICCTPCT 2.3   Urine analysis:    Component Value Date/Time   COLORURINE AMBER (A) 02/08/2016 1303   APPEARANCEUR CLOUDY (A) 02/08/2016 1303   LABSPEC >1.046 (H) 02/08/2016 1303   PHURINE 6.0 02/08/2016 1303   GLUCOSEU NEGATIVE 02/08/2016 1303   HGBUR TRACE (A) 02/08/2016 1303   BILIRUBINUR NEGATIVE 02/08/2016 1303   KETONESUR NEGATIVE 02/08/2016 1303   PROTEINUR 30 (A) 02/08/2016 1303   UROBILINOGEN 1.0 07/28/2007 1539   NITRITE NEGATIVE 02/08/2016 1303   LEUKOCYTESUR NEGATIVE 02/08/2016 1303   Sepsis Labs: @LABRCNTIP (procalcitonin:4,lacticidven:4)  ) No results found for this or any previous visit (from the past 240 hour(s)).    Radiology Studies: No results found.   Scheduled Meds: . sodium chloride   Intravenous Once  . feeding supplement  1 Container Oral TID BM  . loperamide  4 mg Oral QID  . pantoprazole  40 mg Oral BID AC  . potassium chloride  40 mEq Oral Once  . psyllium  1 packet Oral BID  . tigecycline (TYGACIL) IVPB  50 mg Intravenous Q12H  . tobramycin  7 mg/kg (Ideal) Intravenous Q24H   Continuous Infusions:   LOS: 12 days   Time Spent in minutes   30 minutes  Candela Krul D.O. on 02/20/2016 at 10:37 AM  Between 7am to 7pm - Pager - [phone]  After 7pm go to [email or web address] - password [email or web address]  And look for the night coverage person covering for me after hours  [facility] Office  [phone]

## 2016-02-20 NOTE — Progress Notes (Signed)
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 Pt with recent admission for small bowel perf on 10/13 with resection and D/C 10/19. PMHx asthma , back sx    PT Comments    Patient tolerated increased gait and therex this session however continues to demonstrated limited activity tolerance. SPC for ambulation today and pt with difficulty advancing L LE and with maintaining upright posture. Feel RW is still best however pt likely will not be compliant at home. Continue to progress as tolerated.   Follow Up Recommendations  Home health PT;Supervision for mobility/OOB     Equipment Recommendations  Rolling walker with 5" wheels;3in1 (PT)    Recommendations for Other Services OT consult     Precautions / Restrictions Precautions Precautions: Fall Precaution Comments: ostomy Restrictions Weight Bearing Restrictions: No    Mobility  Bed Mobility               General bed mobility comments: Pt received sitting in recliner on arrival.    Transfers Overall transfer level: Needs assistance Equipment used: None Transfers: Sit to/from Stand Sit to Stand: Supervision         General transfer comment: supervision for safety  Ambulation/Gait Ambulation/Gait assistance: Min guard Ambulation Distance (Feet): 100 Feet Assistive device: Straight cane Gait Pattern/deviations: Step-through pattern;Decreased step length - left;Decreased dorsiflexion - left;Trunk flexed Gait velocity: decreased   General Gait Details: cues for sequencing of gait with use of AD and for posture/forward gaze; pt with weakness L LE > R LE and difficulty advancing L LE forward without increased knee/hip flexion; pt fatigued   Stairs            Wheelchair Mobility     Modified Rankin (Stroke Patients Only)       Balance     Sitting balance-Leahy Scale: Good       Standing balance-Leahy Scale: Fair                      Cognition Arousal/Alertness: Awake/alert Behavior During Therapy: WFL for tasks assessed/performed Overall Cognitive Status: Within Functional Limits for tasks assessed                      Exercises Total Joint Exercises Standing Hip Extension: AROM;Both;10 reps;Standing General Exercises - Lower Extremity Hip ABduction/ADduction: AROM;Both;10 reps;Standing Hip Flexion/Marching: AROM;Both;10 reps;Standing Toe Raises: AROM;Both;10 reps;Standing    General Comments        Pertinent Vitals/Pain Pain Assessment: Faces Faces Pain Scale: Hurts little more Pain Location: back and abdomen Pain Descriptors / Indicators: Aching;Guarding;Sore Pain Intervention(s): Limited activity within patient's tolerance;Monitored during session;Premedicated before session;Repositioned    Home Living                      Prior Function            PT Goals (current goals can now be found in the care plan section) Acute Rehab PT Goals Patient Stated Goal: return home Progress towards PT goals: Progressing toward goals    Frequency    Min 3X/week      PT Plan Current plan remains appropriate    Co-evaluation             End of Session Equipment Utilized During Treatment: Gait  belt Activity Tolerance: Patient tolerated treatment well Patient left: in chair;with call bell/phone within reach     Time: 1510-1536 PT Time Calculation (min) (ACUTE ONLY): 26 min  Charges:  $Gait Training: 8-22 mins $Therapeutic Exercise: 8-22 mins                    G Codes:      Sal[place] April, PTA Pager: [phone]   02/20/2016, 4:23 PM

## 2016-02-20 NOTE — Consult Note (Signed)
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 High output stoma, apparently over the weekend he has had leakage and now the skin is severely denuded.  Making pouching adherence very difficult. I have tried to explain this to the patient and bedside staff today.  Peristomal assessment: denudation that extends distal to the stoma about 6-7 cm.  Painful for the patient.  Treatment options for stomal/peristomal skin: I have crusted the skin with antifungal powder today, that was what was in the patients room, I have ordered the patient some ostomy powder Output liquid green, high volumes.  On imodium Ostomy pouching: 1pc.flat with 2" barrier ring  Education provided: explained acidity of stool from small bowel and that skin is now damaged, would not expect wear time to be longer than 12 hours or so.  I will check on patient in 2 hours to see what things look like Enrolled patient in [place] Start Discharge program: Yes  WOC will follow along closely for assistance with ostomy care. [facility] MSN, [place], [place], [place]

## 2016-02-20 NOTE — Progress Notes (Signed)
Nutrition Follow-up  DOCUMENTATION CODES:   Severe malnutrition in context of acute illness/injury  INTERVENTION:   -D/c Boost Breeze po TID, each supplement provides 250 kcal and 9 grams of protein -Ensure Enlive po BID, each supplement provides 350 kcal and 20 grams of protein  NUTRITION DIAGNOSIS:   Malnutrition (Severe) related to acute illness (recent SB perforation) as evidenced by severe depletion of muscle mass, severe fluid accumulation, severe depletion of body fat.  Ongoing  GOAL:   Patient will meet greater than or equal to 90% of their needs  Progressing  MONITOR:   PO intake, Supplement acceptance, Diet advancement, Labs, Weight trends, Skin, I & O's  REASON FOR ASSESSMENT:   Consult New TPN/TNA  ASSESSMENT:   Pt with a past medical hx of small bowel perforation on 01/27/16 s/p resection with subsequent wound dehisence. He was d/c on 02/02/16. Developed septic shock, ischemic bowel, abscess. Pt was taken to the OR on 02/08/16 for exploratory surgery, wound vac placement and ileocectomy with open abdomen.   Pt in with multiple family members at time of visit.   Reviewed CWOCN note; ileostomy change completed this AM.   Pt was advanced to a dysphagia 2 diet with thin liquids on 02/16/16 by SLP. Pt with good good appetite; meal completion 100%.   Pt remains with high output ileostomy per staff. Metamucil added on 02/18/16.   Pt has not been receiving Boost Breeze supplements, due to supply not being available on unit. Due to diet advancement, will substitute for Ensure Enlive.  Labs reviewed: Na: 133, K: 3.4.   Diet Order:  DIET DYS 2 Room service appropriate? Yes; Fluid consistency: Thin  Skin:  Wound (see comment) (abdominal incision)  Last BM:  02/20/16  Height:   Ht Readings from Last 1 Encounters:  02/08/16 5\' 9"  (1.753 m)    Weight:   Wt Readings from Last 1 Encounters:  02/16/16 123 lb 3.8 oz (55.9 kg)    Ideal Body Weight:  72.7  kg  BMI:  Body mass index is 18.2 kg/m.  Estimated Nutritional Needs:   Kcal:  2100-2300  Protein:  120-140 grams  Fluid:  > 2 L/day  EDUCATION NEEDS:   No education needs identified at this time  Mirissa Lopresti A. Jimmye Norman, RD, LDN, CDE Pager: [phone] After hours Pager: [phone]

## 2016-02-21 LAB — TYPE AND SCREEN
ABO/RH(D): A NEG
Antibody Screen: NEGATIVE
Unit division: 0

## 2016-02-21 LAB — BASIC METABOLIC PANEL
Anion gap: 9 (ref 5–15)
BUN: 21 mg/dL — AB (ref 6–20)
CHLORIDE: 103 mmol/L (ref 101–111)
CO2: 21 mmol/L — ABNORMAL LOW (ref 22–32)
CREATININE: 0.63 mg/dL (ref 0.61–1.24)
Calcium: 7.6 mg/dL — ABNORMAL LOW (ref 8.9–10.3)
GFR calc Af Amer: >60 mL/min (ref >60)
GFR calc non Af Amer: >60 mL/min (ref >60)
GLUCOSE: 76 mg/dL (ref 65–99)
Potassium: 3.3 mmol/L — ABNORMAL LOW (ref 3.5–5.1)
SODIUM: 133 mmol/L — AB (ref 135–145)

## 2016-02-21 LAB — CBC
HCT: 25.3 % — ABNORMAL LOW (ref 39.0–52.0)
HEMOGLOBIN: 8.5 g/dL — AB (ref 13.0–17.0)
MCH: 26.8 pg (ref 26.0–34.0)
MCHC: 33.6 g/dL (ref 30.0–36.0)
MCV: 79.8 fL (ref 78.0–100.0)
PLATELETS: 576 10*3/uL — AB (ref 150–400)
RBC: 3.17 MIL/uL — ABNORMAL LOW (ref 4.22–5.81)
RDW: 16 % — ABNORMAL HIGH (ref 11.5–15.5)
WBC: 9.1 10*3/uL (ref 4.0–10.5)

## 2016-02-21 LAB — MAGNESIUM: MAGNESIUM: 2 mg/dL (ref 1.7–2.4)

## 2016-02-21 LAB — PHOSPHORUS: Phosphorus: 3.6 mg/dL (ref 2.5–4.6)

## 2016-02-21 MED ORDER — POTASSIUM CHLORIDE CRYS ER 20 MEQ PO TBCR
40.0000 meq | EXTENDED_RELEASE_TABLET | Freq: Once | ORAL | Status: AC
  Administered 2016-02-21: 40 meq via ORAL
  Filled 2016-02-21: qty 2

## 2016-02-21 MED ORDER — SALINE SPRAY 0.65 % NA SOLN
1.0000 | NASAL | Status: DC | PRN
  Administered 2016-02-21 – 2016-02-22 (×2): 1 via NASAL
  Filled 2016-02-21: qty 44

## 2016-02-21 NOTE — Progress Notes (Signed)
PROGRESS NOTE    [name]  [id number] DOB: [birth year] DOA: 02/08/2016 PCP: No primary care provider on file.   Chief Complaint  Patient presents with  . Abdominal Pain    Brief Narrative:  HPI on 02/08/2016 by Ms. [name], PA (Surgery) 50 y/o male with PMH back pain and small bowel perforation s/p ileocecectomy 01/28/16 who presented to [facility] with abdominal pain. He was previously discharged from hospital on 02/02/16. Patient states his abdominal pain started late last night/early this morning and is described as sharp and non-radiating. Rates pain as 10/10. Pain is worse with movement and inspiration. No relief with PO pain medication. Associated symptoms include nausea, chills, and SOB. Patient has been having daily BMs. Denies fever, CP, vomiting, diarrhea, melena, hematochezia, urinary symptoms, and weakness. He denies regular medication use.  Interim history  He required re-admit as he developed septic shock, ischemic bowel, and an abscess. 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 -Ongoing care per general surgery -Antibiotic management per ID: Completed Tigecycline and tobramycin through 02/20/2016 -Drainage/leakage noted from ileostomy site -Wound care/ostomy consulted -Continue metamucil   L Pleural Effusion  -Small on f/u CXR -follow w/o intervention for now  -No complaints of shortness of breath  Septic shock -Resolved  Lower extremity edema -resolving  Hyopkalemia -Replace and continue to monitor  -Magnesium 2.1  AKI -Resolved  Normocytic Anemia -hemoglobin currently 8.5 (after receiving 1uPRBCs) -Continue to monitor CBC -Possibly due to recent surgery? -Anemia panel shows Iron 11, ferritin 257  -Given dose of feraheme -Discontinue Lovenox  DVT Prophylaxis  SCDs  Code Status: Full  Family  Communication: None at bedside  Disposition Plan: Admitted. Discharge pending surgery recommendations.   Consultants PCCM General surgery Infectious disease  Procedures  10/25 - readmitted - Intubated and central access obtained - Exploratory surgery, wound vac placement and ileocectomy  10/27 - Exploratory surgery-peritonitis, distal ileum ischemia , s/p resection w/ ileostomy  10/29 - Exploratory laparotomy with removal of vacuum pack dressing and closure of abdominal wall  10/30 - Extubated 11/1 - TRH assumed care  Antibiotics   Anti-infectives    Start     Dose/Rate Route Frequency Ordered Stop   02/18/16 1400  tobramycin (NEBCIN) 490 mg in dextrose 5 % 100 mL IVPB     7 mg/kg  70.7 kg (Ideal) 112.3 mL/hr over 60 Minutes Intravenous Every 24 hours 02/18/16 1041 02/20/16 1453   02/15/16 0031  tigecycline (TYGACIL) 50 mg in sodium chloride 0.9 % 100 mL IVPB     50 mg 200 mL/hr over 30 Minutes Intravenous Every 12 hours 02/14/16 1232 02/20/16 1325   02/14/16 1400  tobramycin (NEBCIN) 510 mg in dextrose 5 % 100 mL IVPB  Status:  Discontinued     7 mg/kg  72.9 kg 112.8 mL/hr over 60 Minutes Intravenous Every 24 hours 02/14/16 1241 02/18/16 1041   02/14/16 1245  tigecycline (TYGACIL) 100 mg in sodium chloride 0.9 % 100 mL IVPB     100 mg 200 mL/hr over 30 Minutes Intravenous  Once 02/14/16 1232 02/14/16 1342   02/13/16 1200  cefTRIAXone (ROCEPHIN) 2 g in dextrose 5 % 50 mL IVPB  Status:  Discontinued     2 g 100 mL/hr over 30 Minutes Intravenous Every 24 hours 02/13/16 0857 02/14/16 1205   02/13/16 1200  tobramycin (NEBCIN) 530 mg in  dextrose 5 % 100 mL IVPB  Status:  Discontinued     7 mg/kg  75.5 kg (Adjusted) 113.3 mL/hr over 60 Minutes Intravenous Every 24 hours 02/13/16 1123 02/14/16 1234   02/10/16 1500  anidulafungin (ERAXIS) 100 mg in sodium chloride 0.9 % 100 mL IVPB     100 mg over 90 Minutes Intravenous Every 24 hours 02/09/16 0804 02/15/16 1556   02/10/16 1500   vancomycin (VANCOCIN) IVPB 1000 mg/200 mL premix  Status:  Discontinued     1,000 mg 200 mL/hr over 60 Minutes Intravenous Every 8 hours 02/10/16 1239 02/13/16 0856   02/10/16 1500  imipenem-cilastatin (PRIMAXIN) 1,000 mg in sodium chloride 0.9 % 250 mL IVPB  Status:  Discontinued     1,000 mg 250 mL/hr over 60 Minutes Intravenous Every 8 hours 02/10/16 1239 02/13/16 0856   02/09/16 1000  imipenem-cilastatin (PRIMAXIN) 1,000 mg in sodium chloride 0.9 % 250 mL IVPB  Status:  Discontinued     1,000 mg 250 mL/hr over 60 Minutes Intravenous Every 8 hours 02/09/16 0812 02/10/16 1239   02/09/16 1000  vancomycin (VANCOCIN) IVPB 1000 mg/200 mL premix  Status:  Discontinued     1,000 mg 200 mL/hr over 60 Minutes Intravenous Every 8 hours 02/09/16 0812 02/10/16 1239   02/09/16 0900  anidulafungin (ERAXIS) 200 mg in sodium chloride 0.9 % 200 mL IVPB     200 mg over 180 Minutes Intravenous  Once 02/09/16 0804 02/09/16 1209   02/09/16 0830  anidulafungin (ERAXIS) 100 mg in sodium chloride 0.9 % 100 mL IVPB  Status:  Discontinued     100 mg over 90 Minutes Intravenous Every 24 hours 02/08/16 0811 02/09/16 0804   02/08/16 1800  vancomycin (VANCOCIN) IVPB 750 mg/150 ml premix  Status:  Discontinued     750 mg 150 mL/hr over 60 Minutes Intravenous Every 8 hours 02/08/16 0847 02/09/16 0812   02/08/16 1400  piperacillin-tazobactam (ZOSYN) IVPB 3.375 g  Status:  Discontinued     3.375 g 12.5 mL/hr over 240 Minutes Intravenous Every 8 hours 02/08/16 0432 02/08/16 0802   02/08/16 0900  imipenem-cilastatin (PRIMAXIN) 500 mg in sodium chloride 0.9 % 100 mL IVPB  Status:  Discontinued     500 mg 200 mL/hr over 30 Minutes Intravenous Every 6 hours 02/08/16 0818 02/09/16 0812   02/08/16 0830  ertapenem (INVANZ) 1 g in sodium chloride 0.9 % 50 mL IVPB  Status:  Discontinued     1 g 100 mL/hr over 30 Minutes Intravenous Every 24 hours 02/08/16 0756 02/08/16 0812   02/08/16 0830  anidulafungin (ERAXIS) 200 mg in  sodium chloride 0.9 % 200 mL IVPB  Status:  Discontinued     200 mg over 180 Minutes Intravenous  Once 02/08/16 0811 02/09/16 1457   02/08/16 0830  vancomycin (VANCOCIN) 1,500 mg in sodium chloride 0.9 % 500 mL IVPB  Status:  Discontinued     1,500 mg 250 mL/hr over 120 Minutes Intravenous  Once 02/08/16 0815 02/09/16 1355   02/08/16 0815  vancomycin (VANCOCIN) 1,500 mg in sodium chloride 0.9 % 500 mL IVPB  Status:  Discontinued     1,500 mg 250 mL/hr over 120 Minutes Intravenous Every 12 hours 02/08/16 0811 02/08/16 0812   02/08/16 0430  piperacillin-tazobactam (ZOSYN) IVPB 3.375 g     3.375 g 100 mL/hr over 30 Minutes Intravenous  Once 02/08/16 0419 02/08/16 0532      Subjective:   Moshe Salisbury seen and examined today.  Feels leakage is  better, feels he is improivng slowly. Feels stools are thickening up. Denies further complaints. Denies chest pain, shortness of breath, nausea, vomiting, dizziness, headache.   Objective:   Vitals:   02/20/16 1113 02/20/16 1335 02/20/16 2204 02/21/16 0513  BP: 124/76 116/61 (!) 101/55 (!) 107/58  Pulse: 68 67 61 62  Resp: 16 20 16 20   Temp: 98.8 F (37.1 C) 97.5 F (36.4 C) 98.2 F (36.8 C) 98.6 F (37 C)  TempSrc: Oral Oral Oral Oral  SpO2: 100% 100% 100% 99%  Weight:      Height:        Intake/Output Summary (Last 24 hours) at 02/21/16 1252 Last data filed at 02/21/16 1119  Gross per 24 hour  Intake              452 ml  Output             2760 ml  Net            -2308 ml   Filed Weights   02/13/16 0350 02/14/16 0400 02/16/16 0400  Weight: 82.6 kg (182 lb 1.6 oz) 72.9 kg (160 lb 11.5 oz) 55.9 kg (123 lb 3.8 oz)    Exam  General: Well developed, well nourished, NAD  HEENT: NCAT,mucous membranes moist.   Cardiovascular: S1 S2 auscultated, no murmurs, RRR  Respiratory: Clear to auscultation bilaterally with equal chest rise  Abdomen: Soft, nontender, nondistended, + bowel sounds, + ileostomy  Extremities: warm dry without  cyanosis clubbing or edema  Neuro: AAOx3, nonfocal   Psych: Normal affect and demeanor, pleasant   Data Reviewed: I have personally reviewed following labs and imaging studies  CBC:  Recent Labs Lab 02/14/16 1736  02/17/16 0435 02/18/16 0500 02/19/16 0500 02/20/16 0534 02/21/16 0415  WBC 16.5*  < > 16.5* 12.6* 12.9* 8.0 9.1  NEUTROABS 13.0*  --   --   --   --   --   --   HGB 7.6*  < > 8.2* 7.3* 7.7* 7.0* 8.5*  HCT 22.7*  < > 24.5* 21.7* 23.1* 21.0* 25.3*  MCV 80.2  < > 80.1 80.7 80.5 79.2 79.8  PLT 357  < > 574* 558* 637* 343 576*  < > = values in this interval not displayed. Basic Metabolic Panel:  Recent Labs Lab 02/17/16 0435 02/18/16 0500 02/19/16 0500 02/20/16 0534 02/21/16 0415  NA 135 134* 134* 133* 133*  K 3.7 3.5 3.7 3.4* 3.3*  CL 103 105 105 104 103  CO2 26 24 21* 22 21*  GLUCOSE 83 80 78 88 76  BUN 20 21* 23* 21* 21*  CREATININE 0.54* 0.57* 0.63 0.70 0.63  CALCIUM 7.6* 7.3* 7.3* 7.5* 7.6*  MG 2.1 2.0 2.0 2.0 2.0  PHOS 3.4 3.6 3.9 4.0 3.6   GFR: Estimated Creatinine Clearance: 88.3 mL/min (by C-G formula based on SCr of 0.63 mg/dL). Liver Function Tests:  Recent Labs Lab 02/16/16 0420 02/17/16 0435 02/18/16 0500 02/19/16 0500 02/20/16 0534  AST 25 22 19 19 21   ALT 20 22 19 18 18   ALKPHOS 141* 137* 119 123 127*  BILITOT 0.4 0.6 0.4 0.6 0.6  PROT 4.9* 5.0* 4.8* 4.8* 5.1*  ALBUMIN 1.5* 1.6* 1.5* 1.6* 1.6*   No results for input(s): LIPASE, AMYLASE in the last 168 hours. No results for input(s): AMMONIA in the last 168 hours. Coagulation Profile: No results for input(s): INR, PROTIME in the last 168 hours. Cardiac Enzymes: No results for input(s): CKTOTAL, CKMB, CKMBINDEX, TROPONINI in the  last 168 hours. BNP (last 3 results) No results for input(s): PROBNP in the last 8760 hours. HbA1C: No results for input(s): HGBA1C in the last 72 hours. CBG:  Recent Labs Lab 02/15/16 0451 02/15/16 0828 02/15/16 1122 02/15/16 1554  02/16/16 0728  GLUCAP 82 77 80 107* 79   Lipid Profile: No results for input(s): CHOL, HDL, LDLCALC, TRIG, CHOLHDL, LDLDIRECT in the last 72 hours. Thyroid Function Tests: No results for input(s): TSH, T4TOTAL, FREET4, T3FREE, THYROIDAB in the last 72 hours. Anemia Panel:  Recent Labs  02/18/16 1515  VITAMINB12 1,445*  FOLATE 21.5  FERRITIN 257  TIBC 196*  IRON 11*  RETICCTPCT 2.3   Urine analysis:    Component Value Date/Time   COLORURINE AMBER (A) 02/08/2016 1303   APPEARANCEUR CLOUDY (A) 02/08/2016 1303   LABSPEC >1.046 (H) 02/08/2016 1303   PHURINE 6.0 02/08/2016 1303   GLUCOSEU NEGATIVE 02/08/2016 1303   HGBUR TRACE (A) 02/08/2016 1303   BILIRUBINUR NEGATIVE 02/08/2016 1303   KETONESUR NEGATIVE 02/08/2016 1303   PROTEINUR 30 (A) 02/08/2016 1303   UROBILINOGEN 1.0 07/28/2007 1539   NITRITE NEGATIVE 02/08/2016 1303   LEUKOCYTESUR NEGATIVE 02/08/2016 1303   Sepsis Labs: @LABRCNTIP (procalcitonin:4,lacticidven:4)  ) No results found for this or any previous visit (from the past 240 hour(s)).    Radiology Studies: No results found.   Scheduled Meds: . sodium chloride   Intravenous Once  . feeding supplement (ENSURE ENLIVE)  237 mL Oral BID BM  . loperamide  4 mg Oral QID  . pantoprazole  40 mg Oral BID AC  . psyllium  1 packet Oral BID   Continuous Infusions:   LOS: 13 days   Time Spent in minutes   30 minutes  Marqus Macphee D.O. on 02/21/2016 at 12:52 PM  Between 7am to 7pm - Pager - [phone]  After 7pm go to [email or web address] - password [email or web address]  And look for the night coverage person covering for me after hours  [facility] Office  [phone]

## 2016-02-21 NOTE — Progress Notes (Signed)
Patient ID: [name], male   DOB: March 23, 1966, 50 y.o.   MRN: [id number]  [facility] Surgery Progress Note  9 Days Post-Op  Subjective: In better spirits today. Ileostomy still with high output, but stool seems to be thickening up. Skin around stoma slowly improving. Tolerating diet and has a good appetite.  Objective: Vital signs in last 24 hours: Temp:  [97.5 F (36.4 C)-98.8 F (37.1 C)] 98.6 F (37 C) (11/07 0513) Pulse Rate:  [61-77] 62 (11/07 0513) Resp:  [16-20] 20 (11/07 0513) BP: (101-124)/(55-76) 107/58 (11/07 0513) SpO2:  [99 %-100 %] 99 % (11/07 0513) Last BM Date: 02/20/16 (with ileostomy )  Intake/Output from previous day: 11/06 0701 - 11/07 0700 In: 452 [P.O.:110; I.V.:30; Blood:312] Out: [id number] [Urine:1455; [id number] Intake/Output this shift: No intake/output data recorded.  PE: Gen:  Alert, NAD, pleasant Card:  RRR Pulm:  CTAB Abd: Soft, NT/ND, +BS, midline incision clean and dry with only a small area at distal and proximal aspects minimally open but no drainage or erythema, stoma with less skin denuding than yesterday and liquid brown stool in bag  Lab Results:   Recent Labs  02/20/16 0534 02/21/16 0415  WBC 8.0 9.1  HGB 7.0* 8.5*  HCT 21.0* 25.3*  PLT 343 576*   BMET  Recent Labs  02/20/16 0534 02/21/16 0415  NA 133* 133*  K 3.4* 3.3*  CL 104 103  CO2 22 21*  GLUCOSE 88 76  BUN 21* 21*  CREATININE 0.70 0.63  CALCIUM 7.5* 7.6*   PT/INR No results for input(s): LABPROT, INR in the last 72 hours. CMP     Component Value Date/Time   NA 133 (L) 02/21/2016 0415   K 3.3 (L) 02/21/2016 0415   CL 103 02/21/2016 0415   CO2 21 (L) 02/21/2016 0415   GLUCOSE 76 02/21/2016 0415   BUN 21 (H) 02/21/2016 0415   CREATININE 0.63 02/21/2016 0415   CALCIUM 7.6 (L) 02/21/2016 0415   PROT 5.1 (L) 02/20/2016 0534   ALBUMIN 1.6 (L) 02/20/2016 0534   AST 21 02/20/2016 0534   ALT 18 02/20/2016 0534   ALKPHOS 127 (H) 02/20/2016 0534   BILITOT 0.6 02/20/2016 0534   GFRNONAA >60 02/21/2016 0415   GFRAA >60 02/21/2016 0415   Lipase     Component Value Date/Time   LIPASE 16 02/08/2016 0412       Studies/Results: No results found.  Anti-infectives: Anti-infectives    Start     Dose/Rate Route Frequency Ordered Stop   02/18/16 1400  tobramycin (NEBCIN) 490 mg in dextrose 5 % 100 mL IVPB     7 mg/kg  70.7 kg (Ideal) 112.3 mL/hr over 60 Minutes Intravenous Every 24 hours 02/18/16 1041 02/20/16 1453   02/15/16 0031  tigecycline (TYGACIL) 50 mg in sodium chloride 0.9 % 100 mL IVPB     50 mg 200 mL/hr over 30 Minutes Intravenous Every 12 hours 02/14/16 1232 02/20/16 1325   02/14/16 1400  tobramycin (NEBCIN) 510 mg in dextrose 5 % 100 mL IVPB  Status:  Discontinued     7 mg/kg  72.9 kg 112.8 mL/hr over 60 Minutes Intravenous Every 24 hours 02/14/16 1241 02/18/16 1041   02/14/16 1245  tigecycline (TYGACIL) 100 mg in sodium chloride 0.9 % 100 mL IVPB     100 mg 200 mL/hr over 30 Minutes Intravenous  Once 02/14/16 1232 02/14/16 1342   02/13/16 1200  cefTRIAXone (ROCEPHIN) 2 g in dextrose 5 % 50 mL IVPB  Status:  Discontinued     2 g 100 mL/hr over 30 Minutes Intravenous Every 24 hours 02/13/16 0857 02/14/16 1205   02/13/16 1200  tobramycin (NEBCIN) 530 mg in dextrose 5 % 100 mL IVPB  Status:  Discontinued     7 mg/kg  75.5 kg (Adjusted) 113.3 mL/hr over 60 Minutes Intravenous Every 24 hours 02/13/16 1123 02/14/16 1234   02/10/16 1500  anidulafungin (ERAXIS) 100 mg in sodium chloride 0.9 % 100 mL IVPB     100 mg over 90 Minutes Intravenous Every 24 hours 02/09/16 0804 02/15/16 1556   02/10/16 1500  vancomycin (VANCOCIN) IVPB 1000 mg/200 mL premix  Status:  Discontinued     1,000 mg 200 mL/hr over 60 Minutes Intravenous Every 8 hours 02/10/16 1239 02/13/16 0856   02/10/16 1500  imipenem-cilastatin (PRIMAXIN) 1,000 mg in sodium chloride 0.9 % 250 mL IVPB  Status:  Discontinued     1,000 mg 250 mL/hr over 60 Minutes  Intravenous Every 8 hours 02/10/16 1239 02/13/16 0856   02/09/16 1000  imipenem-cilastatin (PRIMAXIN) 1,000 mg in sodium chloride 0.9 % 250 mL IVPB  Status:  Discontinued     1,000 mg 250 mL/hr over 60 Minutes Intravenous Every 8 hours 02/09/16 0812 02/10/16 1239   02/09/16 1000  vancomycin (VANCOCIN) IVPB 1000 mg/200 mL premix  Status:  Discontinued     1,000 mg 200 mL/hr over 60 Minutes Intravenous Every 8 hours 02/09/16 0812 02/10/16 1239   02/09/16 0900  anidulafungin (ERAXIS) 200 mg in sodium chloride 0.9 % 200 mL IVPB     200 mg over 180 Minutes Intravenous  Once 02/09/16 0804 02/09/16 1209   02/09/16 0830  anidulafungin (ERAXIS) 100 mg in sodium chloride 0.9 % 100 mL IVPB  Status:  Discontinued     100 mg over 90 Minutes Intravenous Every 24 hours 02/08/16 0811 02/09/16 0804   02/08/16 1800  vancomycin (VANCOCIN) IVPB 750 mg/150 ml premix  Status:  Discontinued     750 mg 150 mL/hr over 60 Minutes Intravenous Every 8 hours 02/08/16 0847 02/09/16 0812   02/08/16 1400  piperacillin-tazobactam (ZOSYN) IVPB 3.375 g  Status:  Discontinued     3.375 g 12.5 mL/hr over 240 Minutes Intravenous Every 8 hours 02/08/16 0432 02/08/16 0802   02/08/16 0900  imipenem-cilastatin (PRIMAXIN) 500 mg in sodium chloride 0.9 % 100 mL IVPB  Status:  Discontinued     500 mg 200 mL/hr over 30 Minutes Intravenous Every 6 hours 02/08/16 0818 02/09/16 0812   02/08/16 0830  ertapenem (INVANZ) 1 g in sodium chloride 0.9 % 50 mL IVPB  Status:  Discontinued     1 g 100 mL/hr over 30 Minutes Intravenous Every 24 hours 02/08/16 0756 02/08/16 0812   02/08/16 0830  anidulafungin (ERAXIS) 200 mg in sodium chloride 0.9 % 200 mL IVPB  Status:  Discontinued     200 mg over 180 Minutes Intravenous  Once 02/08/16 0811 02/09/16 1457   02/08/16 0830  vancomycin (VANCOCIN) 1,500 mg in sodium chloride 0.9 % 500 mL IVPB  Status:  Discontinued     1,500 mg 250 mL/hr over 120 Minutes Intravenous  Once 02/08/16 0815 02/09/16 1355    02/08/16 0815  vancomycin (VANCOCIN) 1,500 mg in sodium chloride 0.9 % 500 mL IVPB  Status:  Discontinued     1,500 mg 250 mL/hr over 120 Minutes Intravenous Every 12 hours 02/08/16 0811 02/08/16 0812   02/08/16 0430  piperacillin-tazobactam (ZOSYN) IVPB 3.375 g     3.375 g  100 mL/hr over 30 Minutes Intravenous  Once 02/08/16 0419 02/08/16 0532       Assessment/Plan Crohns disease s/p ileocecectomy 01/27/16 for SB perforation Anastomotic leak/ peritoneal abscess - s/p exp lap/ resection of anastomosis 02/08/16 Washout/ VAC change 02/10/16 Washout/ fascial closure 02/12/16 - Ileostomy output still high yesterday, 1700cc/24hr, but stool appears a little thicker today. Continue immodium QID and metamucil. - tolerating diet, good appetite  Acute blood loss anemia - stable, Hg 8.5 today WBC - 9.1  Phos/Mag normal this AM Slight hypokalemia and hyponatremia today >> replace and continue to monitor Chronic pain syndrome  ID - tigecycline and tobramycin through 02/20/16 per ID VTE - SCDs FEN - dysphagia 2 diet, Ensure  Plan: continue wet to dry dressings to midline wound. Continue immodium and metamucil and monitor ileostomy output. Continue Pt/mobilization. Labs in Am. Likely home once ileostomy output normalizes.   LOS: 13 days    Jerrye Beavers , [facility] Surgery 02/21/2016, 8:17 AM Pager: [phone] Consults: [phone] Mon-Fri 7:00 am-4:30 pm Sat-Sun 7:00 am-11:30 am

## 2016-02-21 NOTE — Consult Note (Signed)
[place] Nurse ostomy follow up Stoma type/location: RLQ, end ileostomy Stomal assessment/size: pink, moist, budded, 1 1/8" more oval now. New pattern cut Peristomal assessment: denudation still present, but improved, now extends only about 2.5cm  Treatment options for stomal/peristomal skin: crusted the affected skin with antifungal powder today, he reports the regular ostomy powder burns his skin more.  Output thicker now, he is own 4x day immodium and using metamucil to make Ensure shakes.  Ostomy pouching: 1pc with 2" barrier ring applied per patient request today, changed, pouch was still intact but patient was concerned about leakage   Education provided:  Instructed patient on importance of the choices of food and making sure he chooses foods that he can digest well. Avoid foods like olive, corn, coconut since the patient has no teeth.  Enrolled patient in [place] Start Discharge program: Yes, I have updated patient information for 1pc pouch samples and 2" barrier rings.   [place] Nurse will follow along with you for continued support with ostomy teaching and care Elizabth Palka [facility], RN, [place], [place]

## 2016-02-21 NOTE — Progress Notes (Signed)
Physical Therapy Treatment Patient Details Name: [name] MRN: [id number] DOB: [birth year] Today's Date: 02/21/2016    History of Present Illness Patient is a 50 y/o male admitted with septic shock and exp lap on 10/25 with ileocectomy, 10/27 ileostomy, VDRF 10/25-10/30. Pt with recent admission for small bowel perf on 10/13 with resection and D/C 10/19. PMHx asthma , back sx    PT Comments    The pt is making progress toward all goals, although he continues to have weakness in LLE over RLE.  Pt ambulated further and tolerated therapeutic exercises while sitting.  Pt will benefit from continued PT to increase his strength, balance, and safety.  Continue with POC.  Follow Up Recommendations  Home health PT;Supervision for mobility/OOB     Equipment Recommendations  Rolling walker with 5" wheels;3in1 (PT)    Recommendations for Other Services OT consult     Precautions / Restrictions Precautions Precautions: Fall Precaution Comments: ostomy Restrictions Weight Bearing Restrictions: No    Mobility  Bed Mobility Overal bed mobility: Needs Assistance Bed Mobility: Supine to Sit;Sit to Supine     Supine to sit: Supervision Sit to supine: Supervision   General bed mobility comments: no assistance needed  Transfers Overall transfer level: Needs assistance Equipment used: None Transfers: Sit to/from Stand Sit to Stand: Supervision         General transfer comment: supervision for safety.  RW placed in front of pt but he did not use it.  Ambulation/Gait Ambulation/Gait assistance: Supervision Ambulation Distance (Feet): 200 Feet Assistive device: Rolling walker (2 wheeled) Gait Pattern/deviations: Step-through pattern;Decreased step length - right;Decreased step length - left;Decreased dorsiflexion - right;Shuffle;Trunk flexed Gait velocity: decreased Gait velocity interpretation: Below normal speed for age/gender General Gait Details: Difficulty advancing LLE  without increased hip/knee flexion and difficulty advancing RLE due to decreased dorsiflexion.  Verbal cues needed for forward gaze and to lift RLE higher during swing phase.   Stairs            Wheelchair Mobility    Modified Rankin (Stroke Patients Only)       Balance     Sitting balance-Leahy Scale: Good     Standing balance support: Bilateral upper extremity supported;During functional activity;No upper extremity supported Standing balance-Leahy Scale: Fair Standing balance comment: Was able to stand without UE support for several minutes while using the bathroom, but needed bilateral UE support during gait.                    Cognition Arousal/Alertness: Awake/alert Behavior During Therapy: WFL for tasks assessed/performed Overall Cognitive Status: Within Functional Limits for tasks assessed                      Exercises Total Joint Exercises Long Arc Quad: AROM;Both;10 reps;Seated Marching in Standing: AROM;Both;5 reps;Standing General Exercises - Lower Extremity Toe Raises: AROM;Right;10 reps;AAROM;Left;Seated    General Comments        Pertinent Vitals/Pain Pain Assessment: Faces Faces Pain Scale: Hurts little more Pain Location: back and abdomen Pain Descriptors / Indicators: Guarding;Sore Pain Intervention(s): Limited activity within patient's tolerance;Monitored during session    Home Living                      Prior Function            PT Goals (current goals can now be found in the care plan section) Acute Rehab PT Goals Patient Stated Goal: return home PT Goal  Formulation: With patient Time For Goal Achievement: 02/29/16 Potential to Achieve Goals: Good Progress towards PT goals: Progressing toward goals    Frequency    Min 3X/week      PT Plan Current plan remains appropriate    Co-evaluation             End of Session Equipment Utilized During Treatment:  (Pt refused the gait belt.) Activity  Tolerance: Patient tolerated treatment well Patient left: in bed;with call bell/phone within reach     Time: [id number] PT Time Calculation (min) (ACUTE ONLY): 28 min  Charges:  $Gait Training: 8-22 mins $Therapeutic Exercise: 8-22 mins                    G Codes:      Bary Castilla 03/17/2016, 5:17 PM  Rito Ehrlich. Nida Boatman Pager: [phone]

## 2016-02-22 LAB — CBC
HEMATOCRIT: 26.7 % — AB (ref 39.0–52.0)
Hemoglobin: 9 g/dL — ABNORMAL LOW (ref 13.0–17.0)
MCH: 27.2 pg (ref 26.0–34.0)
MCHC: 33.7 g/dL (ref 30.0–36.0)
MCV: 80.7 fL (ref 78.0–100.0)
Platelets: 517 10*3/uL — ABNORMAL HIGH (ref 150–400)
RBC: 3.31 MIL/uL — ABNORMAL LOW (ref 4.22–5.81)
RDW: 16.9 % — AB (ref 11.5–15.5)
WBC: 8.2 10*3/uL (ref 4.0–10.5)

## 2016-02-22 LAB — BASIC METABOLIC PANEL
ANION GAP: 9 (ref 5–15)
BUN: 12 mg/dL (ref 6–20)
CALCIUM: 7.9 mg/dL — AB (ref 8.9–10.3)
CO2: 22 mmol/L (ref 22–32)
CREATININE: 0.6 mg/dL — AB (ref 0.61–1.24)
Chloride: 101 mmol/L (ref 101–111)
Glucose, Bld: 80 mg/dL (ref 65–99)
Potassium: 3.1 mmol/L — ABNORMAL LOW (ref 3.5–5.1)
SODIUM: 132 mmol/L — AB (ref 135–145)

## 2016-02-22 LAB — PHOSPHORUS: PHOSPHORUS: 3.4 mg/dL (ref 2.5–4.6)

## 2016-02-22 LAB — MAGNESIUM: MAGNESIUM: 1.8 mg/dL (ref 1.7–2.4)

## 2016-02-22 MED ORDER — POTASSIUM CHLORIDE CRYS ER 20 MEQ PO TBCR
40.0000 meq | EXTENDED_RELEASE_TABLET | Freq: Two times a day (BID) | ORAL | Status: AC
Start: 2016-02-22 — End: 2016-02-22
  Administered 2016-02-22 (×2): 40 meq via ORAL
  Filled 2016-02-22 (×2): qty 2

## 2016-02-22 MED ORDER — LORATADINE 10 MG PO TABS
10.0000 mg | ORAL_TABLET | Freq: Every day | ORAL | Status: DC
  Administered 2016-02-22 – 2016-03-04 (×12): 10 mg via ORAL
  Filled 2016-02-22 (×12): qty 1

## 2016-02-22 MED ORDER — LOPERAMIDE HCL 2 MG PO CAPS
8.0000 mg | ORAL_CAPSULE | Freq: Four times a day (QID) | ORAL | Status: DC
  Administered 2016-02-22 – 2016-03-04 (×45): 8 mg via ORAL
  Filled 2016-02-22 (×48): qty 4

## 2016-02-22 MED ORDER — OXYMORPHONE HCL ER 10 MG PO T12A
40.0000 mg | EXTENDED_RELEASE_TABLET | Freq: Three times a day (TID) | ORAL | Status: DC
  Administered 2016-02-22 – 2016-03-04 (×34): 40 mg via ORAL
  Filled 2016-02-22 (×35): qty 4

## 2016-02-22 NOTE — Progress Notes (Signed)
PT Cancellation Note  Patient Details Name: [name] MRN: [id number] DOB: Sep 22, 1965   Cancelled Treatment:    Reason Eval/Treat Not Completed: Pain limiting ability to participate (Pt declined stating that he did not feel well enough today.)   Bary Castilla 02/22/2016, 4:14 PM Rito Ehrlich. [place], [place]

## 2016-02-22 NOTE — Progress Notes (Signed)
Patient ID: [name], male   DOB: 1965/11/12, 50 y.o.   MRN: [id number]  [facility] Surgery Progress Note  10 Days Post-Op  Subjective: Sore today. Also complaining of head congestion, may be allergies.  Tolerating diet well.   Objective: Vital signs in last 24 hours: Temp:  [97.8 F (36.6 C)-99.2 F (37.3 C)] 97.8 F (36.6 C) (11/08 0420) Pulse Rate:  [71-74] 71 (11/08 0420) Resp:  [20] 20 (11/08 0420) BP: (95-122)/(52-67) 114/52 (11/08 0420) SpO2:  [94 %-99 %] 99 % (11/08 0420) Last BM Date: 02/21/16  Intake/Output from previous day: 11/07 0701 - 11/08 0700 In: 260 [P.O.:240; I.V.:20] Out: 3115 [Urine:1650; [id number] Intake/Output this shift: Total I/O In: -  Out: 375 [Urine:250; Stool:125]  PE: Gen:  Alert, NAD, pleasant Card:  RRR Pulm:  CTAB Abd: Soft, NT/ND, +BS, midline incision clean and dry with only a small area at distal and proximal aspects minimally open but no drainage or erythema, stoma with less skin denuding than yesterday and liquid brown stool in bag  Lab Results:   Recent Labs  02/21/16 0415 02/22/16 0605  WBC 9.1 8.2  HGB 8.5* 9.0*  HCT 25.3* 26.7*  PLT 576* 517*   BMET  Recent Labs  02/21/16 0415 02/22/16 0605  NA 133* 132*  K 3.3* 3.1*  CL 103 101  CO2 21* 22  GLUCOSE 76 80  BUN 21* 12  CREATININE 0.63 0.60*  CALCIUM 7.6* 7.9*   PT/INR No results for input(s): LABPROT, INR in the last 72 hours. CMP     Component Value Date/Time   NA 132 (L) 02/22/2016 0605   K 3.1 (L) 02/22/2016 0605   CL 101 02/22/2016 0605   CO2 22 02/22/2016 0605   GLUCOSE 80 02/22/2016 0605   BUN 12 02/22/2016 0605   CREATININE 0.60 (L) 02/22/2016 0605   CALCIUM 7.9 (L) 02/22/2016 0605   PROT 5.1 (L) 02/20/2016 0534   ALBUMIN 1.6 (L) 02/20/2016 0534   AST 21 02/20/2016 0534   ALT 18 02/20/2016 0534   ALKPHOS 127 (H) 02/20/2016 0534   BILITOT 0.6 02/20/2016 0534   GFRNONAA >60 02/22/2016 0605   GFRAA >60 02/22/2016 0605   Lipase      Component Value Date/Time   LIPASE 16 02/08/2016 0412       Studies/Results: No results found.  Anti-infectives: Anti-infectives    Start     Dose/Rate Route Frequency Ordered Stop   02/18/16 1400  tobramycin (NEBCIN) 490 mg in dextrose 5 % 100 mL IVPB     7 mg/kg  70.7 kg (Ideal) 112.3 mL/hr over 60 Minutes Intravenous Every 24 hours 02/18/16 1041 02/20/16 1453   02/15/16 0031  tigecycline (TYGACIL) 50 mg in sodium chloride 0.9 % 100 mL IVPB     50 mg 200 mL/hr over 30 Minutes Intravenous Every 12 hours 02/14/16 1232 02/20/16 1325   02/14/16 1400  tobramycin (NEBCIN) 510 mg in dextrose 5 % 100 mL IVPB  Status:  Discontinued     7 mg/kg  72.9 kg 112.8 mL/hr over 60 Minutes Intravenous Every 24 hours 02/14/16 1241 02/18/16 1041   02/14/16 1245  tigecycline (TYGACIL) 100 mg in sodium chloride 0.9 % 100 mL IVPB     100 mg 200 mL/hr over 30 Minutes Intravenous  Once 02/14/16 1232 02/14/16 1342   02/13/16 1200  cefTRIAXone (ROCEPHIN) 2 g in dextrose 5 % 50 mL IVPB  Status:  Discontinued     2 g 100 mL/hr over 30  Minutes Intravenous Every 24 hours 02/13/16 0857 02/14/16 1205   02/13/16 1200  tobramycin (NEBCIN) 530 mg in dextrose 5 % 100 mL IVPB  Status:  Discontinued     7 mg/kg  75.5 kg (Adjusted) 113.3 mL/hr over 60 Minutes Intravenous Every 24 hours 02/13/16 1123 02/14/16 1234   02/10/16 1500  anidulafungin (ERAXIS) 100 mg in sodium chloride 0.9 % 100 mL IVPB     100 mg over 90 Minutes Intravenous Every 24 hours 02/09/16 0804 02/15/16 1556   02/10/16 1500  vancomycin (VANCOCIN) IVPB 1000 mg/200 mL premix  Status:  Discontinued     1,000 mg 200 mL/hr over 60 Minutes Intravenous Every 8 hours 02/10/16 1239 02/13/16 0856   02/10/16 1500  imipenem-cilastatin (PRIMAXIN) 1,000 mg in sodium chloride 0.9 % 250 mL IVPB  Status:  Discontinued     1,000 mg 250 mL/hr over 60 Minutes Intravenous Every 8 hours 02/10/16 1239 02/13/16 0856   02/09/16 1000  imipenem-cilastatin  (PRIMAXIN) 1,000 mg in sodium chloride 0.9 % 250 mL IVPB  Status:  Discontinued     1,000 mg 250 mL/hr over 60 Minutes Intravenous Every 8 hours 02/09/16 0812 02/10/16 1239   02/09/16 1000  vancomycin (VANCOCIN) IVPB 1000 mg/200 mL premix  Status:  Discontinued     1,000 mg 200 mL/hr over 60 Minutes Intravenous Every 8 hours 02/09/16 0812 02/10/16 1239   02/09/16 0900  anidulafungin (ERAXIS) 200 mg in sodium chloride 0.9 % 200 mL IVPB     200 mg over 180 Minutes Intravenous  Once 02/09/16 0804 02/09/16 1209   02/09/16 0830  anidulafungin (ERAXIS) 100 mg in sodium chloride 0.9 % 100 mL IVPB  Status:  Discontinued     100 mg over 90 Minutes Intravenous Every 24 hours 02/08/16 0811 02/09/16 0804   02/08/16 1800  vancomycin (VANCOCIN) IVPB 750 mg/150 ml premix  Status:  Discontinued     750 mg 150 mL/hr over 60 Minutes Intravenous Every 8 hours 02/08/16 0847 02/09/16 0812   02/08/16 1400  piperacillin-tazobactam (ZOSYN) IVPB 3.375 g  Status:  Discontinued     3.375 g 12.5 mL/hr over 240 Minutes Intravenous Every 8 hours 02/08/16 0432 02/08/16 0802   02/08/16 0900  imipenem-cilastatin (PRIMAXIN) 500 mg in sodium chloride 0.9 % 100 mL IVPB  Status:  Discontinued     500 mg 200 mL/hr over 30 Minutes Intravenous Every 6 hours 02/08/16 0818 02/09/16 0812   02/08/16 0830  ertapenem (INVANZ) 1 g in sodium chloride 0.9 % 50 mL IVPB  Status:  Discontinued     1 g 100 mL/hr over 30 Minutes Intravenous Every 24 hours 02/08/16 0756 02/08/16 0812   02/08/16 0830  anidulafungin (ERAXIS) 200 mg in sodium chloride 0.9 % 200 mL IVPB  Status:  Discontinued     200 mg over 180 Minutes Intravenous  Once 02/08/16 0811 02/09/16 1457   02/08/16 0830  vancomycin (VANCOCIN) 1,500 mg in sodium chloride 0.9 % 500 mL IVPB  Status:  Discontinued     1,500 mg 250 mL/hr over 120 Minutes Intravenous  Once 02/08/16 0815 02/09/16 1355   02/08/16 0815  vancomycin (VANCOCIN) 1,500 mg in sodium chloride 0.9 % 500 mL IVPB   Status:  Discontinued     1,500 mg 250 mL/hr over 120 Minutes Intravenous Every 12 hours 02/08/16 0811 02/08/16 0812   02/08/16 0430  piperacillin-tazobactam (ZOSYN) IVPB 3.375 g     3.375 g 100 mL/hr over 30 Minutes Intravenous  Once 02/08/16 0419 02/08/16  0532       Assessment/Plan Crohns disease s/p ileocecectomy 01/27/16 for SB perforation Anastomotic leak/ peritoneal abscess - s/p exp lap/ resection of anastomosis 02/08/16 Washout/ VAC change 02/10/16 Washout/ fascial closure 02/12/16 - Ileostomy output still high but less than day before, 1465cc/24hr. Increase immodium to 8mg  QID and continue metamucil. - tolerating diet  Acute blood loss anemia - stable, Hg 9.0 today WBC - 8.2 Phos/Mag normal this AM Slight hypokalemia (3.) and hyponatremia (132) today >> replace and continue to monitor Chronic pain syndrome  ID - tigecycline and tobramycin through 02/20/16 per ID VTE - SCDs FEN - dysphagia 2 diet, Ensure  Plan: Wound healing, continue wet to dry dressings to midline wound. Stool output still high but slowly decreasing, increase immodium to 8mg  QID and continue metamucil. Monitor output and electrolytes. Plan to go home once output <1L. Continue PT/mobilization. Labs in AM. Zyrtec for congestion. Working with pharmacy to switch back to home dose pain medication (morphine 40mg  TID).   LOS: 14 days    Jerrye Beavers , [facility] Surgery 02/22/2016, 7:49 AM Pager: [phone] Consults: [phone] Mon-Fri 7:00 am-4:30 pm Sat-Sun 7:00 am-11:30 am

## 2016-02-22 NOTE — Progress Notes (Signed)
PROGRESS NOTE    [name]  [id number] DOB: November 16, 1965 DOA: 02/08/2016 PCP: No primary care provider on file.   Chief Complaint  Patient presents with  . Abdominal Pain    Brief Narrative:  HPI on 02/08/2016 by Ms. [name], PA (Surgery) 50 y/o male with PMH back pain and small bowel perforation s/p ileocecectomy 01/28/16 who presented to [facility] with abdominal pain. He was previously discharged from hospital on 02/02/16. Patient states his abdominal pain started late last night/early this morning and is described as sharp and non-radiating. Rates pain as 10/10. Pain is worse with movement and inspiration. No relief with PO pain medication. Associated symptoms include nausea, chills, and SOB. Patient has been having daily BMs. Denies fever, CP, vomiting, diarrhea, melena, hematochezia, urinary symptoms, and weakness. He denies regular medication use.  Interim history  He required re-admit as he developed septic shock, ischemic bowel, and an abscess. He was taken to the OR on 02/08/16 for abscess drainage, and ileocecum removal. Patient continues to have high output, per sugery, once ouptut  <1L, can be discharged. Also found to have anemia, received 1uPRBC, hemoglobin currently stable.  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 -Ongoing care per general surgery -Antibiotic management per ID: Completed Tigecycline and tobramycin through 02/20/2016 -Drainage/leakage noted from ileostomy site- improved -Wound care/ostomy consulted -Continue metamucil and immodium -Continues to have high output, once output <1L, patient can be discharged home (per surgery)  L Pleural Effusion  -Small on f/u CXR -follow w/o intervention for now  -No complaints of shortness of breath  Septic shock -Resolved  Lower extremity edema -resolving  Hyopkalemia -Replace and continue to monitor  -Magnesium  2.1  AKI -Resolved  Normocytic Anemia -hemoglobin currently 9 (after receiving 1uPRBCs) -Continue to monitor CBC -Possibly due to recent surgery? -Anemia panel shows Iron 11, ferritin 257  -Given dose of feraheme -Discontinue Lovenox  DVT Prophylaxis  SCDs  Code Status: Full  Family Communication: None at bedside  Disposition Plan: Admitted. Discharge pending surgery recommendations (output <1L)  Consultants PCCM General surgery Infectious disease  Procedures  10/25 - readmitted - Intubated and central access obtained - Exploratory surgery, wound vac placement and ileocectomy  10/27 - Exploratory surgery-peritonitis, distal ileum ischemia , s/p resection w/ ileostomy  10/29 - Exploratory laparotomy with removal of vacuum pack dressing and closure of abdominal wall  10/30 - Extubated 11/1 - TR[facility] assumed care  Antibiotics   Anti-infectives    Start     Dose/Rate Route Frequency Ordered Stop   02/18/16 1400  tobramycin (NEBCIN) 490 mg in dextrose 5 % 100 mL IVPB     7 mg/kg  70.7 kg (Ideal) 112.3 mL/hr over 60 Minutes Intravenous Every 24 hours 02/18/16 1041 02/20/16 1453   02/15/16 0031  tigecycline (TYGACIL) 50 mg in sodium chloride 0.9 % 100 mL IVPB     50 mg 200 mL/hr over 30 Minutes Intravenous Every 12 hours 02/14/16 1232 02/20/16 1325   02/14/16 1400  tobramycin (NEBCIN) 510 mg in dextrose 5 % 100 mL IVPB  Status:  Discontinued     7 mg/kg  72.9 kg 112.8 mL/hr over 60 Minutes Intravenous Every 24 hours 02/14/16 1241 02/18/16 1041   02/14/16 1245  tigecycline (TYGACIL) 100 mg in sodium chloride 0.9 % 100 mL IVPB     100 mg 200 mL/hr over 30 Minutes Intravenous  Once 02/14/16 1232 02/14/16 1342   02/13/16 1200  cefTRIAXone (ROCEPHIN) 2 g in dextrose 5 % 50 mL IVPB  Status:  Discontinued     2 g 100 mL/hr over 30 Minutes Intravenous Every 24 hours 02/13/16 0857 02/14/16 1205   02/13/16 1200  tobramycin (NEBCIN) 530 mg in dextrose 5 % 100 mL IVPB  Status:   Discontinued     7 mg/kg  75.5 kg (Adjusted) 113.3 mL/hr over 60 Minutes Intravenous Every 24 hours 02/13/16 1123 02/14/16 1234   02/10/16 1500  anidulafungin (ERAXIS) 100 mg in sodium chloride 0.9 % 100 mL IVPB     100 mg over 90 Minutes Intravenous Every 24 hours 02/09/16 0804 02/15/16 1556   02/10/16 1500  vancomycin (VANCOCIN) IVPB 1000 mg/200 mL premix  Status:  Discontinued     1,000 mg 200 mL/hr over 60 Minutes Intravenous Every 8 hours 02/10/16 1239 02/13/16 0856   02/10/16 1500  imipenem-cilastatin (PRIMAXIN) 1,000 mg in sodium chloride 0.9 % 250 mL IVPB  Status:  Discontinued     1,000 mg 250 mL/hr over 60 Minutes Intravenous Every 8 hours 02/10/16 1239 02/13/16 0856   02/09/16 1000  imipenem-cilastatin (PRIMAXIN) 1,000 mg in sodium chloride 0.9 % 250 mL IVPB  Status:  Discontinued     1,000 mg 250 mL/hr over 60 Minutes Intravenous Every 8 hours 02/09/16 0812 02/10/16 1239   02/09/16 1000  vancomycin (VANCOCIN) IVPB 1000 mg/200 mL premix  Status:  Discontinued     1,000 mg 200 mL/hr over 60 Minutes Intravenous Every 8 hours 02/09/16 0812 02/10/16 1239   02/09/16 0900  anidulafungin (ERAXIS) 200 mg in sodium chloride 0.9 % 200 mL IVPB     200 mg over 180 Minutes Intravenous  Once 02/09/16 0804 02/09/16 1209   02/09/16 0830  anidulafungin (ERAXIS) 100 mg in sodium chloride 0.9 % 100 mL IVPB  Status:  Discontinued     100 mg over 90 Minutes Intravenous Every 24 hours 02/08/16 0811 02/09/16 0804   02/08/16 1800  vancomycin (VANCOCIN) IVPB 750 mg/150 ml premix  Status:  Discontinued     750 mg 150 mL/hr over 60 Minutes Intravenous Every 8 hours 02/08/16 0847 02/09/16 0812   02/08/16 1400  piperacillin-tazobactam (ZOSYN) IVPB 3.375 g  Status:  Discontinued     3.375 g 12.5 mL/hr over 240 Minutes Intravenous Every 8 hours 02/08/16 0432 02/08/16 0802   02/08/16 0900  imipenem-cilastatin (PRIMAXIN) 500 mg in sodium chloride 0.9 % 100 mL IVPB  Status:  Discontinued     500 mg 200  mL/hr over 30 Minutes Intravenous Every 6 hours 02/08/16 0818 02/09/16 0812   02/08/16 0830  ertapenem (INVANZ) 1 g in sodium chloride 0.9 % 50 mL IVPB  Status:  Discontinued     1 g 100 mL/hr over 30 Minutes Intravenous Every 24 hours 02/08/16 0756 02/08/16 0812   02/08/16 0830  anidulafungin (ERAXIS) 200 mg in sodium chloride 0.9 % 200 mL IVPB  Status:  Discontinued     200 mg over 180 Minutes Intravenous  Once 02/08/16 0811 02/09/16 1457   02/08/16 0830  vancomycin (VANCOCIN) 1,500 mg in sodium chloride 0.9 % 500 mL IVPB  Status:  Discontinued     1,500 mg 250 mL/hr over 120 Minutes Intravenous  Once 02/08/16 0815 02/09/16 1355   02/08/16 0815  vancomycin (VANCOCIN) 1,500 mg in sodium chloride 0.9 % 500 mL IVPB  Status:  Discontinued     1,500 mg 250 mL/hr over 120 Minutes Intravenous Every 12 hours 02/08/16 0811 02/08/16 [id number]  02/08/16 0430  piperacillin-tazobactam (ZOSYN) IVPB 3.375 g     3.375 g 100 mL/hr over 30 Minutes Intravenous  Once 02/08/16 0419 02/08/16 0532      Subjective:   Moshe Salisbury seen and examined today.  Patient feels tired this morning, but feels his output is improved and the leakage around the ileostomy bag has improved. Denies chest pain, shortness of breath, nausea, vomiting, dizziness, headache.   Objective:   Vitals:   02/21/16 0513 02/21/16 1354 02/21/16 2058 02/22/16 0420  BP: (!) 107/58 122/67 95/60 (!) 114/52  Pulse: 62 72 74 71  Resp: 20 20 20 20   Temp: 98.6 F (37 C) 99.2 F (37.3 C) 98.3 F (36.8 C) 97.8 F (36.6 C)  TempSrc: Oral Oral Oral Oral  SpO2: 99% 94% 97% 99%  Weight:      Height:        Intake/Output Summary (Last 24 hours) at 02/22/16 1134 Last data filed at 02/22/16 1042  Gross per 24 hour  Intake              500 ml  Output             3865 ml  Net            -3365 ml   Filed Weights   02/13/16 0350 02/14/16 0400 02/16/16 0400  Weight: 82.6 kg (182 lb 1.6 oz) 72.9 kg (160 lb 11.5 oz) 55.9 kg (123 lb 3.8 oz)     Exam  General: Well developed, well nourished, NAD  HEENT: NCAT,mucous membranes moist.   Cardiovascular: S1 S2 auscultated, no murmurs, RRR  Respiratory: Clear to auscultation bilaterally with equal chest rise  Abdomen: Soft, nontender, nondistended, + bowel sounds, + ileostomy  Extremities: warm dry without cyanosis clubbing or edema  Neuro: AAOx3, nonfocal   Psych: Normal affect and demeanor, pleasant   Data Reviewed: I have personally reviewed following labs and imaging studies  CBC:  Recent Labs Lab 02/18/16 0500 02/19/16 0500 02/20/16 0534 02/21/16 0415 02/22/16 0605  WBC 12.6* 12.9* 8.0 9.1 8.2  HGB 7.3* 7.7* 7.0* 8.5* 9.0*  HCT 21.7* 23.1* 21.0* 25.3* 26.7*  MCV 80.7 80.5 79.2 79.8 80.7  PLT 558* 637* 343 576* [id number]*   Basic Metabolic Panel:  Recent Labs Lab 02/18/16 0500 02/19/16 0500 02/20/16 0534 02/21/16 0415 02/22/16 0605  NA 134* 134* 133* 133* 132*  K 3.5 3.7 3.4* 3.3* 3.1*  CL 105 105 104 103 101  CO2 24 21* 22 21* 22  GLUCOSE 80 78 88 76 80  BUN 21* 23* 21* 21* 12  CREATININE 0.57* 0.63 0.70 0.63 0.60*  CALCIUM 7.3* 7.3* 7.5* 7.6* 7.9*  MG 2.0 2.0 2.0 2.0 1.8  PHOS 3.6 3.9 4.0 3.6 3.4   GFR: Estimated Creatinine Clearance: 88.3 mL/min (by C-G formula based on SCr of 0.6 mg/dL (L)). Liver Function Tests:  Recent Labs Lab 02/16/16 0420 02/17/16 0435 02/18/16 0500 02/19/16 0500 02/20/16 0534  AST 25 22 19 19 21   ALT 20 22 19 18 18   ALKPHOS 141* 137* 119 123 127*  BILITOT 0.4 0.6 0.4 0.6 0.6  PROT 4.9* 5.0* 4.8* 4.8* 5.1*  ALBUMIN 1.5* 1.6* 1.5* 1.6* 1.6*   No results for input(s): LIPASE, AMYLASE in the last 168 hours. No results for input(s): AMMONIA in the last 168 hours. Coagulation Profile: No results for input(s): INR, PROTIME in the last 168 hours. Cardiac Enzymes: No results for input(s): CKTOTAL, CKMB, CKMBINDEX, TROPONINI in the last 168 hours. BNP (  last 3 results) No results for input(s): PROBNP in the last  8760 hours. HbA1C: No results for input(s): HGBA1C in the last 72 hours. CBG:  Recent Labs Lab 02/15/16 1554 02/16/16 0728  GLUCAP 107* 79   Lipid Profile: No results for input(s): CHOL, HDL, LDLCALC, TRIG, CHOLHDL, LDLDIRECT in the last 72 hours. Thyroid Function Tests: No results for input(s): TSH, T4TOTAL, FREET4, T3FREE, THYROIDAB in the last 72 hours. Anemia Panel: No results for input(s): VITAMINB12, FOLATE, FERRITIN, TIBC, IRON, RETICCTPCT in the last 72 hours. Urine analysis:    Component Value Date/Time   COLORURINE AMBER (A) 02/08/2016 1303   APPEARANCEUR CLOUDY (A) 02/08/2016 1303   LABSPEC >1.046 (H) 02/08/2016 1303   PHURINE 6.0 02/08/2016 1303   GLUCOSEU NEGATIVE 02/08/2016 1303   HGBUR TRACE (A) 02/08/2016 1303   BILIRUBINUR NEGATIVE 02/08/2016 1303   KETONESUR NEGATIVE 02/08/2016 1303   PROTEINUR 30 (A) 02/08/2016 1303   UROBILINOGEN 1.0 07/28/2007 1539   NITRITE NEGATIVE 02/08/2016 1303   LEUKOCYTESUR NEGATIVE 02/08/2016 1303   Sepsis Labs: @LABRCNTIP (procalcitonin:4,lacticidven:4)  ) No results found for this or any previous visit (from the past 240 hour(s)).    Radiology Studies: No results found.   Scheduled Meds: . sodium chloride   Intravenous Once  . feeding supplement (ENSURE ENLIVE)  237 mL Oral BID BM  . loperamide  8 mg Oral QID  . loratadine  10 mg Oral Daily  . oxymorphone  40 mg Oral TID  . pantoprazole  40 mg Oral BID AC  . potassium chloride  40 mEq Oral BID  . psyllium  1 packet Oral BID   Continuous Infusions:   LOS: 14 days   Time Spent in minutes   30 minutes  Akiya Morr D.O. on 02/22/2016 at 11:34 AM  Between 7am to 7pm - Pager - [phone]  After 7pm go to [email or web address] - password [email or web address]  And look for the night coverage person covering for me after hours  [facility] Office  [phone]

## 2016-02-22 NOTE — Consult Note (Signed)
[place] Nurse ostomy follow up Stoma type/location: RLQ, end ileostomy Stomal assessment/size: 1 1/8" oval shaped, Os at center, budded  Peristomal assessment: much improved today, denudation only 1cm from 3-9 o'clock, much less painful for patient Treatment options for stomal/peristomal skin: using antifungal powder to crust denuded skin, taught patient and his wife to only use powder when the skin is irritated, once skin is dry and not weeping DC use of antifungal powder or ostomy powder. Output much thicker with some chunks, yellow Ostomy pouching: 1pc.with 2" barrier ring Pouch that was placed with patient by [place] nurse at [facility] yesterday was still intact at my visit today.  Patient reports peristomal skin is not burning with pouch on which is a good sign that stool has not undermined the wafer.  Education provided: Met with patient's wife, daughter-in-law, son at the bedside.  Demonstrated drawing pattern on the new pouch and wife practiced cutting new pouch opening. She did this with minimal cueing.  Wife removed old pouch, WOC demonstrated cleaning peristomal skin and crusting the denuded skin with antifungal powder.  Demonstrated application of barrier ring and placement of new pouch.  Wife removed old pouch independently.  WOC demonstrated application of new pouch and wife removed side tabs independently. WOC feels wife and patient will be able to manage ostomy care at home.   Enrolled patient in [place] Start Discharge program: Yes  Additional supplies ordered for patient's room in preparation for patient DC soon.  Aubreanna Percle [facility] MSN, [place], [place], [place]

## 2016-02-23 DIAGNOSIS — R198 Other specified symptoms and signs involving the digestive system and abdomen: Secondary | ICD-10-CM

## 2016-02-23 DIAGNOSIS — K559 Vascular disorder of intestine, unspecified: Secondary | ICD-10-CM

## 2016-02-23 LAB — CBC
HCT: 27.5 % — ABNORMAL LOW (ref 39.0–52.0)
Hemoglobin: 9.2 g/dL — ABNORMAL LOW (ref 13.0–17.0)
MCH: 27.5 pg (ref 26.0–34.0)
MCHC: 33.5 g/dL (ref 30.0–36.0)
MCV: 82.1 fL (ref 78.0–100.0)
PLATELETS: 471 10*3/uL — AB (ref 150–400)
RBC: 3.35 MIL/uL — AB (ref 4.22–5.81)
RDW: 16.8 % — ABNORMAL HIGH (ref 11.5–15.5)
WBC: 11.9 10*3/uL — ABNORMAL HIGH (ref 4.0–10.5)

## 2016-02-23 LAB — MAGNESIUM: Magnesium: 1.9 mg/dL (ref 1.7–2.4)

## 2016-02-23 LAB — BASIC METABOLIC PANEL
Anion gap: 7 (ref 5–15)
BUN: 10 mg/dL (ref 6–20)
CO2: 26 mmol/L (ref 22–32)
CREATININE: 0.57 mg/dL — AB (ref 0.61–1.24)
Calcium: 8.2 mg/dL — ABNORMAL LOW (ref 8.9–10.3)
Chloride: 98 mmol/L — ABNORMAL LOW (ref 101–111)
GFR calc Af Amer: >60 mL/min (ref >60)
GLUCOSE: 90 mg/dL (ref 65–99)
POTASSIUM: 3.6 mmol/L (ref 3.5–5.1)
Sodium: 131 mmol/L — ABNORMAL LOW (ref 135–145)

## 2016-02-23 LAB — PHOSPHORUS: Phosphorus: 3.1 mg/dL (ref 2.5–4.6)

## 2016-02-23 MED ORDER — MAGNESIUM OXIDE 400 (241.3 MG) MG PO TABS
400.0000 mg | ORAL_TABLET | Freq: Three times a day (TID) | ORAL | Status: DC
  Administered 2016-02-23 – 2016-03-02 (×24): 400 mg via ORAL
  Filled 2016-02-23 (×24): qty 1

## 2016-02-23 MED ORDER — PAREGORIC 2 MG/5ML PO TINC
5.0000 mL | Freq: Two times a day (BID) | ORAL | Status: DC
  Administered 2016-02-23: 5 mL via ORAL
  Filled 2016-02-23 (×2): qty 5

## 2016-02-23 MED ORDER — DEXTROSE-NACL 5-0.9 % IV SOLN
INTRAVENOUS | Status: DC
  Administered 2016-02-23 – 2016-03-01 (×15): via INTRAVENOUS

## 2016-02-23 MED ORDER — POTASSIUM CHLORIDE CRYS ER 20 MEQ PO TBCR
40.0000 meq | EXTENDED_RELEASE_TABLET | Freq: Once | ORAL | Status: AC
  Administered 2016-02-23: 40 meq via ORAL
  Filled 2016-02-23: qty 2

## 2016-02-23 NOTE — Consult Note (Signed)
[place] Nurse ostomy follow up Stoma type/location: RLQ, end ileosomty Stomal assessment/size: oval shaped 1 1/8", budded, os at center Peristomal assessment: pouch intact, did not change  Treatment options for stomal/peristomal skin: using antifungal powder to crust denuded skin Output thicker, yellow, but still high volumes  Ostomy pouching: 1pc. With 2" barrier ring is working well for this patient, pouch placed by his wife and [place] nurse is still intact and does not show evidence of compromise of the seal. Patient is VERY diligent with emptying when it is 1/3 to 1/2 full.  Education provided: reminded patient of the importance of hydration with high output.  Enrolled patient in [place] Start Discharge program: Yes 10 pouches and barrier rings in the room in preparation for DC to home per workmans's comp CM request.   [place] MSN, RN, [place], [place]

## 2016-02-23 NOTE — Progress Notes (Signed)
02/23/2016 11:25: Faxed updated clinicals from Friday 11/ 06/2015 through 02/23/2016 to [facility] Left with Workers Comp as requested [phone]).

## 2016-02-23 NOTE — Progress Notes (Signed)
PROGRESS NOTE    [name]  [id number] DOB: [birth year] DOA: 02/08/2016 PCP: No primary care provider on file.   Brief Narrative:  50 yo male with small bowel perforation, s/p ileocectomy 10/17, presents with abdominal pain. Found to have ischemic bowel and an abscess. Post abscess drainage, developed significant output from colostomy.    Assessment & Plan:   Active Problems:   Abdominal infection ([place])   Peritonitis ([place])   Sepsis ([place])   Sepsis due to Escherichia coli [facility])   Ischemic bowel disease ([place])   Bowel perforation ([place])   Abdominal abscess ([place])   Pleural effusion   Acute kidney injury ([place])   1. High output colostomy bag. Patient with significant output from colostomy bag, over last 24 hours up to 2800 cc, will add IV fluids to compensate for fluid loss. Will replete K and mg. Fluid balance negative 22 Lt, since admission. Continue loperamide, pregoric, metamucil.   2. Chronic pain syndrome. Will continue pain control with hydromorphone, fentanyl, opana. No signs of sedation. Patient will follow as outpatient with pain clinic.   3. Hypokalemia. Due to GI losses, will replete with Kcl po and will add hydration with d5ND at 100 cc/hr. Na at 131 and cl at 98. Will follow on renal panel in am. Renal function with cr at 0.57.   4. Normocytic anemia. Hb and hct stable at 9.2 and 27. Will follow on cell count in am, no signs of active bleeding.  5. Resolved sepsis. Patient completed antibiotic therapy, will continue to follow cell count and temperature curve.     DVT prophylaxis: scd  Code Status: full  Family Communication: I spoke with family at the bedside and all questions were addressed.  Disposition Plan: home    Consultants:   surgery  Procedures:    Antimicrobials:      Subjective: Patient with significant output for colostomy bag, positive orthostatic symptoms. Back pain controlled with pain medications, no nausea or vomiting.    Objective: Vitals:   02/22/16 2308 02/23/16 0045 02/23/16 0541 02/23/16 0918  BP:   120/65 117/69  Pulse:   84 92  Resp:   18   Temp: 100 F (37.8 C) 99.8 F (37.7 C) 98 F (36.7 C) 97.9 F (36.6 C)  TempSrc: Oral Oral Oral Oral  SpO2:   100% 100%  Weight:      Height:        Intake/Output Summary (Last 24 hours) at 02/23/16 1230 Last data filed at 02/23/16 1025  Gross per 24 hour  Intake              240 ml  Output             2200 ml  Net            -1960 ml   Filed Weights   02/13/16 0350 02/14/16 0400 02/16/16 0400  Weight: 82.6 kg (182 lb 1.6 oz) 72.9 kg (160 lb 11.5 oz) 55.9 kg (123 lb 3.8 oz)    Examination:  General exam: Not in pain or dyspnea E ENT: mild conjunctival pallor, no icterus, oral mucosa dry Respiratory system: Clear to auscultation. Respiratory effort normal. No wheezing, rales or rhonchi. Cardiovascular system: S1 & S2 heard, RRR. No JVD, murmurs, rubs, gallops or clicks. No pedal edema. Gastrointestinal system: Abdomen is nondistended, soft and nontender. No organomegaly or masses felt. Normal bowel sounds heard. Central nervous system: Alert and oriented. No focal neurological deficits. Extremities: Symmetric 5 x 5  power. Skin: No rashes, lesions or ulcers .     Data Reviewed: I have personally reviewed following labs and imaging studies  CBC:  Recent Labs Lab 02/19/16 0500 02/20/16 0534 02/21/16 0415 02/22/16 0605 02/23/16 0425  WBC 12.9* 8.0 9.1 8.2 11.9*  HGB 7.7* 7.0* 8.5* 9.0* 9.2*  HCT 23.1* 21.0* 25.3* 26.7* 27.5*  MCV 80.5 79.2 79.8 80.7 82.1  PLT 637* 343 576* 517* [id number]*   Basic Metabolic Panel:  Recent Labs Lab 02/19/16 0500 02/20/16 0534 02/21/16 0415 02/22/16 0605 02/23/16 0425  NA 134* 133* 133* 132* 131*  K 3.7 3.4* 3.3* 3.1* 3.6  CL 105 104 103 101 98*  CO2 21* 22 21* 22 26  GLUCOSE 78 88 76 80 90  BUN 23* 21* 21* 12 10  CREATININE 0.63 0.70 0.63 0.60* 0.57*  CALCIUM 7.3* 7.5* 7.6* 7.9* 8.2*  MG  2.0 2.0 2.0 1.8 1.9  PHOS 3.9 4.0 3.6 3.4 3.1   GFR: Estimated Creatinine Clearance: 88.3 mL/min (by C-G formula based on SCr of 0.57 mg/dL (L)). Liver Function Tests:  Recent Labs Lab 02/17/16 0435 02/18/16 0500 02/19/16 0500 02/20/16 0534  AST 22 19 19 21   ALT 22 19 18 18   ALKPHOS 137* 119 123 127*  BILITOT 0.6 0.4 0.6 0.6  PROT 5.0* 4.8* 4.8* 5.1*  ALBUMIN 1.6* 1.5* 1.6* 1.6*   No results for input(s): LIPASE, AMYLASE in the last 168 hours. No results for input(s): AMMONIA in the last 168 hours. Coagulation Profile: No results for input(s): INR, PROTIME in the last 168 hours. Cardiac Enzymes: No results for input(s): CKTOTAL, CKMB, CKMBINDEX, TROPONINI in the last 168 hours. BNP (last 3 results) No results for input(s): PROBNP in the last 8760 hours. HbA1C: No results for input(s): HGBA1C in the last 72 hours. CBG: No results for input(s): GLUCAP in the last 168 hours. Lipid Profile: No results for input(s): CHOL, HDL, LDLCALC, TRIG, CHOLHDL, LDLDIRECT in the last 72 hours. Thyroid Function Tests: No results for input(s): TSH, T4TOTAL, FREET4, T3FREE, THYROIDAB in the last 72 hours. Anemia Panel: No results for input(s): VITAMINB12, FOLATE, FERRITIN, TIBC, IRON, RETICCTPCT in the last 72 hours. Sepsis Labs: No results for input(s): PROCALCITON, LATICACIDVEN in the last 168 hours.  No results found for this or any previous visit (from the past 240 hour(s)).       Radiology Studies: No results found.      Scheduled Meds: . sodium chloride   Intravenous Once  . feeding supplement (ENSURE ENLIVE)  237 mL Oral BID BM  . loperamide  8 mg Oral QID  . loratadine  10 mg Oral Daily  . oxymorphone  40 mg Oral TID  . pantoprazole  40 mg Oral BID AC  . paregoric  5 mL Oral BID  . psyllium  1 packet Oral BID   Continuous Infusions:   LOS: 15 days        Raseel Jans Gerome Apley, MD [facility] Pager [phone]  If 7PM-7AM, please contact  night-coverage [email or web address] Password [email or web address] 02/23/2016, 12:30 PM

## 2016-02-23 NOTE — Progress Notes (Signed)
Pt abdominal dressing falling off: Pt requesting dressing change; Moist to dry dressing change completed. Noted small amt of yellow drainage; will continue to monitor.  Zenaida Deed, Student-RN

## 2016-02-23 NOTE — Progress Notes (Signed)
Patient ID: [name], male   DOB: [birth year], 50 y.o.   MRN: [id number]  [facility] Surgery Progress Note  11 Days Post-Op  Subjective: Reports less pain today than yesterday, thinks switching to home meds helped. Ileostomy output thickening up, he and wife feel comfortable changing bag by themselves. Tolerating diet, denies n/v. Continues to complain of head congestion.  Objective: Vital signs in last 24 hours: Temp:  [98 F (36.7 C)-100 F (37.8 C)] 98 F (36.7 C) (11/09 0541) Pulse Rate:  [84-111] 84 (11/09 0541) Resp:  [18-19] 18 (11/09 0541) BP: (120-139)/(65-91) 120/65 (11/09 0541) SpO2:  [95 %-100 %] 100 % (11/09 0541) Last BM Date: 02/22/16  Intake/Output from previous day: 11/08 0701 - 11/09 0700 In: 720 [P.O.:480] Out: 3250 [Urine:1225; Stool:2025] Intake/Output this shift: No intake/output data recorded.  PE: Gen: Alert, NAD, pleasant Card: RRR Pulm: CTAB Abd: Soft, NT/ND, +BS, midline incision clean and dry with only a small area at distal and proximal aspects minimally open but no drainage or erythema, stoma with less skin denuding than yesterday and liquid brown stool in bag  Lab Results:   Recent Labs  02/22/16 0605 02/23/16 0425  WBC 8.2 11.9*  HGB 9.0* 9.2*  HCT 26.7* 27.5*  PLT 517* 471*   BMET  Recent Labs  02/22/16 0605 02/23/16 0425  NA 132* 131*  K 3.1* 3.6  CL 101 98*  CO2 22 26  GLUCOSE 80 90  BUN 12 10  CREATININE 0.60* 0.57*  CALCIUM 7.9* 8.2*   PT/INR No results for input(s): LABPROT, INR in the last 72 hours. CMP     Component Value Date/Time   NA 131 (L) 02/23/2016 0425   K 3.6 02/23/2016 0425   CL 98 (L) 02/23/2016 0425   CO2 26 02/23/2016 0425   GLUCOSE 90 02/23/2016 0425   BUN 10 02/23/2016 0425   CREATININE 0.57 (L) 02/23/2016 0425   CALCIUM 8.2 (L) 02/23/2016 0425   PROT 5.1 (L) 02/20/2016 0534   ALBUMIN 1.6 (L) 02/20/2016 0534   AST 21 02/20/2016 0534   ALT 18 02/20/2016 0534   ALKPHOS 127 (H)  02/20/2016 0534   BILITOT 0.6 02/20/2016 0534   GFRNONAA >60 02/23/2016 0425   GFRAA >60 02/23/2016 0425   Lipase     Component Value Date/Time   LIPASE 16 02/08/2016 0412       Studies/Results: No results found.  Anti-infectives: Anti-infectives    Start     Dose/Rate Route Frequency Ordered Stop   02/18/16 1400  tobramycin (NEBCIN) 490 mg in dextrose 5 % 100 mL IVPB     7 mg/kg  70.7 kg (Ideal) 112.3 mL/hr over 60 Minutes Intravenous Every 24 hours 02/18/16 1041 02/20/16 1453   02/15/16 0031  tigecycline (TYGACIL) 50 mg in sodium chloride 0.9 % 100 mL IVPB     50 mg 200 mL/hr over 30 Minutes Intravenous Every 12 hours 02/14/16 1232 02/20/16 1325   02/14/16 1400  tobramycin (NEBCIN) 510 mg in dextrose 5 % 100 mL IVPB  Status:  Discontinued     7 mg/kg  72.9 kg 112.8 mL/hr over 60 Minutes Intravenous Every 24 hours 02/14/16 1241 02/18/16 1041   02/14/16 1245  tigecycline (TYGACIL) 100 mg in sodium chloride 0.9 % 100 mL IVPB     100 mg 200 mL/hr over 30 Minutes Intravenous  Once 02/14/16 1232 02/14/16 1342   02/13/16 1200  cefTRIAXone (ROCEPHIN) 2 g in dextrose 5 % 50 mL IVPB  Status:  Discontinued     2 g 100 mL/hr over 30 Minutes Intravenous Every 24 hours 02/13/16 0857 02/14/16 1205   02/13/16 1200  tobramycin (NEBCIN) 530 mg in dextrose 5 % 100 mL IVPB  Status:  Discontinued     7 mg/kg  75.5 kg (Adjusted) 113.3 mL/hr over 60 Minutes Intravenous Every 24 hours 02/13/16 1123 02/14/16 1234   02/10/16 1500  anidulafungin (ERAXIS) 100 mg in sodium chloride 0.9 % 100 mL IVPB     100 mg over 90 Minutes Intravenous Every 24 hours 02/09/16 0804 02/15/16 1556   02/10/16 1500  vancomycin (VANCOCIN) IVPB 1000 mg/200 mL premix  Status:  Discontinued     1,000 mg 200 mL/hr over 60 Minutes Intravenous Every 8 hours 02/10/16 1239 02/13/16 0856   02/10/16 1500  imipenem-cilastatin (PRIMAXIN) 1,000 mg in sodium chloride 0.9 % 250 mL IVPB  Status:  Discontinued     1,000 mg 250  mL/hr over 60 Minutes Intravenous Every 8 hours 02/10/16 1239 02/13/16 0856   02/09/16 1000  imipenem-cilastatin (PRIMAXIN) 1,000 mg in sodium chloride 0.9 % 250 mL IVPB  Status:  Discontinued     1,000 mg 250 mL/hr over 60 Minutes Intravenous Every 8 hours 02/09/16 0812 02/10/16 1239   02/09/16 1000  vancomycin (VANCOCIN) IVPB 1000 mg/200 mL premix  Status:  Discontinued     1,000 mg 200 mL/hr over 60 Minutes Intravenous Every 8 hours 02/09/16 0812 02/10/16 1239   02/09/16 0900  anidulafungin (ERAXIS) 200 mg in sodium chloride 0.9 % 200 mL IVPB     200 mg over 180 Minutes Intravenous  Once 02/09/16 0804 02/09/16 1209   02/09/16 0830  anidulafungin (ERAXIS) 100 mg in sodium chloride 0.9 % 100 mL IVPB  Status:  Discontinued     100 mg over 90 Minutes Intravenous Every 24 hours 02/08/16 0811 02/09/16 0804   02/08/16 1800  vancomycin (VANCOCIN) IVPB 750 mg/150 ml premix  Status:  Discontinued     750 mg 150 mL/hr over 60 Minutes Intravenous Every 8 hours 02/08/16 0847 02/09/16 0812   02/08/16 1400  piperacillin-tazobactam (ZOSYN) IVPB 3.375 g  Status:  Discontinued     3.375 g 12.5 mL/hr over 240 Minutes Intravenous Every 8 hours 02/08/16 0432 02/08/16 0802   02/08/16 0900  imipenem-cilastatin (PRIMAXIN) 500 mg in sodium chloride 0.9 % 100 mL IVPB  Status:  Discontinued     500 mg 200 mL/hr over 30 Minutes Intravenous Every 6 hours 02/08/16 0818 02/09/16 0812   02/08/16 0830  ertapenem (INVANZ) 1 g in sodium chloride 0.9 % 50 mL IVPB  Status:  Discontinued     1 g 100 mL/hr over 30 Minutes Intravenous Every 24 hours 02/08/16 0756 02/08/16 0812   02/08/16 0830  anidulafungin (ERAXIS) 200 mg in sodium chloride 0.9 % 200 mL IVPB  Status:  Discontinued     200 mg over 180 Minutes Intravenous  Once 02/08/16 0811 02/09/16 1457   02/08/16 0830  vancomycin (VANCOCIN) 1,500 mg in sodium chloride 0.9 % 500 mL IVPB  Status:  Discontinued     1,500 mg 250 mL/hr over 120 Minutes Intravenous  Once  02/08/16 0815 02/09/16 1355   02/08/16 0815  vancomycin (VANCOCIN) 1,500 mg in sodium chloride 0.9 % 500 mL IVPB  Status:  Discontinued     1,500 mg 250 mL/hr over 120 Minutes Intravenous Every 12 hours 02/08/16 0811 02/08/16 0812   02/08/16 0430  piperacillin-tazobactam (ZOSYN) IVPB 3.375 g     3.375 g  100 mL/hr over 30 Minutes Intravenous  Once 02/08/16 0419 02/08/16 0532       Assessment/Plan Crohns disease s/p ileocecectomy 01/27/16 for SB perforation Anastomotic leak/ peritoneal abscess - s/p exp lap/ resection of anastomosis 02/08/16 Washout/ VAC change 02/10/16 Washout/ fascial closure 02/12/16 - Ileostomy with increased output 2025cc/24hr. Continue immodium 8mg  QID and metamucil. Add paregoric 63mL BID (max dose 20mL QID) - tolerating diet, pain improving  Acute blood loss anemia- stable, Hg 9.2 today WBC - 11.9, slightly up from yesterday continue to monitor, TMAX 100 Phos/Mag/K normal this AM Slight hyponatremia (131) today >> replace and continue to monitor Chronic pain syndrome  ID - tigecycline and tobramycinthrough 02/20/16 per ID VTE - SCDs FEN - dysphagia 2 diet, Ensure  Plan: Pain improved from yesterday. Increased ileostomy output, continue imodium and metamucil as before and add paregoric. Monitor output and electrolytes. Plan to go home once output <1L. Wound healing, continue wet to dry dressings to midline wound. Continue PT/mobilization. Labs in AM. Continue home dose pain medication. Continue zyrtec, may add mucinex if this does not improve.   LOS: 15 days    Jerrye Beavers , [facility] 02/23/2016, 8:21 AM Pager: [phone] Consults: [phone] Mon-Fri 7:00 am-4:30 pm Sat-Sun 7:00 am-11:30 am

## 2016-02-23 NOTE — Progress Notes (Signed)
WOC by to check status of ostomy pouch, pouch still intact greater than 24 hours.  Patient is thrilled with outcome with new pouching system and wife and he are confident in care of ostomy.  Patient is emptying pouch independently.  Taquanna Borras [facility] MSN, [place], [place], [place]

## 2016-02-23 NOTE — Progress Notes (Signed)
CM received voiced mail from [facility] @ [phone]  , workmen's compensation manger, requesting update on pt and clinicals. CM called Margarita Grizzle and update given per voce mail and clinicals faxed to [phone]. Whitman Hero RN,BSN,CM [phone]

## 2016-02-24 DIAGNOSIS — K631 Perforation of intestine (nontraumatic): Secondary | ICD-10-CM

## 2016-02-24 DIAGNOSIS — E876 Hypokalemia: Secondary | ICD-10-CM

## 2016-02-24 DIAGNOSIS — K316 Fistula of stomach and duodenum: Secondary | ICD-10-CM

## 2016-02-24 LAB — BASIC METABOLIC PANEL
Anion gap: 7 (ref 5–15)
BUN: <5 mg/dL — ABNORMAL LOW (ref 6–20)
CHLORIDE: 100 mmol/L — AB (ref 101–111)
CO2: 23 mmol/L (ref 22–32)
Calcium: 7.8 mg/dL — ABNORMAL LOW (ref 8.9–10.3)
Creatinine, Ser: 0.57 mg/dL — ABNORMAL LOW (ref 0.61–1.24)
GFR calc non Af Amer: >60 mL/min (ref >60)
Glucose, Bld: 92 mg/dL (ref 65–99)
POTASSIUM: 3.4 mmol/L — AB (ref 3.5–5.1)
SODIUM: 130 mmol/L — AB (ref 135–145)

## 2016-02-24 LAB — CBC
HCT: 29.2 % — ABNORMAL LOW (ref 39.0–52.0)
HEMOGLOBIN: 9.7 g/dL — AB (ref 13.0–17.0)
MCH: 27.6 pg (ref 26.0–34.0)
MCHC: 33.2 g/dL (ref 30.0–36.0)
MCV: 83 fL (ref 78.0–100.0)
Platelets: 412 10*3/uL — ABNORMAL HIGH (ref 150–400)
RBC: 3.52 MIL/uL — AB (ref 4.22–5.81)
RDW: 17.6 % — ABNORMAL HIGH (ref 11.5–15.5)
WBC: 25.3 10*3/uL — ABNORMAL HIGH (ref 4.0–10.5)

## 2016-02-24 LAB — MAGNESIUM: MAGNESIUM: 1.6 mg/dL — AB (ref 1.7–2.4)

## 2016-02-24 LAB — PHOSPHORUS: PHOSPHORUS: 3.2 mg/dL (ref 2.5–4.6)

## 2016-02-24 MED ORDER — POTASSIUM CHLORIDE 20 MEQ PO PACK
40.0000 meq | PACK | Freq: Once | ORAL | Status: AC
  Administered 2016-02-24: 40 meq via ORAL
  Filled 2016-02-24: qty 2

## 2016-02-24 MED ORDER — FENTANYL CITRATE (PF) 100 MCG/2ML IJ SOLN: 50.0000 ug | INTRAMUSCULAR | Status: DC | PRN

## 2016-02-24 MED ORDER — HYDROMORPHONE HCL 2 MG/ML IJ SOLN
0.5000 mg | INTRAMUSCULAR | Status: DC | PRN
  Administered 2016-02-24 – 2016-02-26 (×9): 0.5 mg via INTRAVENOUS
  Filled 2016-02-24 (×9): qty 1

## 2016-02-24 MED ORDER — FENTANYL BOLUS VIA INFUSION: 50.0000 ug | INTRAVENOUS | Status: DC | PRN

## 2016-02-24 NOTE — Progress Notes (Signed)
12 Days Post-Op  Subjective:  did not like paragoric   Ostomy output stable  Some abdominal pain  Objective: Vital signs in last 24 hours: Temp:  [98.4 F (36.9 C)-100 F (37.8 C)] 98.4 F (36.9 C) (11/10 0703) Pulse Rate:  [86-121] 95 (11/10 0703) Resp:  [17-18] 17 (11/10 0703) BP: (105-126)/(60-75) 106/60 (11/10 0703) SpO2:  [97 %-100 %] 100 % (11/10 0703) Last BM Date: 02/23/16  Intake/Output from previous day: 11/09 0701 - 11/10 0700 In: 2066.7 [P.O.:720; I.V.:1346.7] Out: 3695 [Urine:1650; U700672 Intake/Output this shift: Total I/O In: -  Out: 700 [Urine:700]  Incision/Wound:wound open and clean  Sutures in place ostomy functioning  Viable   Lab Results:   Recent Labs  02/23/16 0425 02/24/16 0427  WBC 11.9* 25.3*  HGB 9.2* 9.7*  HCT 27.5* 29.2*  PLT 471* 412*   BMET  Recent Labs  02/23/16 0425 02/24/16 0427  NA 131* 130*  K 3.6 3.4*  CL 98* 100*  CO2 26 23  GLUCOSE 90 92  BUN 10 <5*  CREATININE 0.57* 0.57*  CALCIUM 8.2* 7.8*   PT/INR No results for input(s): LABPROT, INR in the last 72 hours. ABG No results for input(s): PHART, HCO3 in the last 72 hours.  Invalid input(s): PCO2, PO2  Studies/Results: No results found.  Anti-infectives: Anti-infectives    Start     Dose/Rate Route Frequency Ordered Stop   02/18/16 1400  tobramycin (NEBCIN) 490 mg in dextrose 5 % 100 mL IVPB     7 mg/kg  70.7 kg (Ideal) 112.3 mL/hr over 60 Minutes Intravenous Every 24 hours 02/18/16 1041 02/20/16 1453   02/15/16 0031  tigecycline (TYGACIL) 50 mg in sodium chloride 0.9 % 100 mL IVPB     50 mg 200 mL/hr over 30 Minutes Intravenous Every 12 hours 02/14/16 1232 02/20/16 1325   02/14/16 1400  tobramycin (NEBCIN) 510 mg in dextrose 5 % 100 mL IVPB  Status:  Discontinued     7 mg/kg  72.9 kg 112.8 mL/hr over 60 Minutes Intravenous Every 24 hours 02/14/16 1241 02/18/16 1041   02/14/16 1245  tigecycline (TYGACIL) 100 mg in sodium chloride 0.9 % 100 mL  IVPB     100 mg 200 mL/hr over 30 Minutes Intravenous  Once 02/14/16 1232 02/14/16 1342   02/13/16 1200  cefTRIAXone (ROCEPHIN) 2 g in dextrose 5 % 50 mL IVPB  Status:  Discontinued     2 g 100 mL/hr over 30 Minutes Intravenous Every 24 hours 02/13/16 0857 02/14/16 1205   02/13/16 1200  tobramycin (NEBCIN) 530 mg in dextrose 5 % 100 mL IVPB  Status:  Discontinued     7 mg/kg  75.5 kg (Adjusted) 113.3 mL/hr over 60 Minutes Intravenous Every 24 hours 02/13/16 1123 02/14/16 1234   02/10/16 1500  anidulafungin (ERAXIS) 100 mg in sodium chloride 0.9 % 100 mL IVPB     100 mg over 90 Minutes Intravenous Every 24 hours 02/09/16 0804 02/15/16 1556   02/10/16 1500  vancomycin (VANCOCIN) IVPB 1000 mg/200 mL premix  Status:  Discontinued     1,000 mg 200 mL/hr over 60 Minutes Intravenous Every 8 hours 02/10/16 1239 02/13/16 0856   02/10/16 1500  imipenem-cilastatin (PRIMAXIN) 1,000 mg in sodium chloride 0.9 % 250 mL IVPB  Status:  Discontinued     1,000 mg 250 mL/hr over 60 Minutes Intravenous Every 8 hours 02/10/16 1239 02/13/16 0856   02/09/16 1000  imipenem-cilastatin (PRIMAXIN) 1,000 mg in sodium chloride 0.9 % 250 mL IVPB  Status:  Discontinued     1,000 mg 250 mL/hr over 60 Minutes Intravenous Every 8 hours 02/09/16 0812 02/10/16 1239   02/09/16 1000  vancomycin (VANCOCIN) IVPB 1000 mg/200 mL premix  Status:  Discontinued     1,000 mg 200 mL/hr over 60 Minutes Intravenous Every 8 hours 02/09/16 0812 02/10/16 1239   02/09/16 0900  anidulafungin (ERAXIS) 200 mg in sodium chloride 0.9 % 200 mL IVPB     200 mg over 180 Minutes Intravenous  Once 02/09/16 0804 02/09/16 1209   02/09/16 0830  anidulafungin (ERAXIS) 100 mg in sodium chloride 0.9 % 100 mL IVPB  Status:  Discontinued     100 mg over 90 Minutes Intravenous Every 24 hours 02/08/16 0811 02/09/16 0804   02/08/16 1800  vancomycin (VANCOCIN) IVPB 750 mg/150 ml premix  Status:  Discontinued     750 mg 150 mL/hr over 60 Minutes Intravenous  Every 8 hours 02/08/16 0847 02/09/16 0812   02/08/16 1400  piperacillin-tazobactam (ZOSYN) IVPB 3.375 g  Status:  Discontinued     3.375 g 12.5 mL/hr over 240 Minutes Intravenous Every 8 hours 02/08/16 0432 02/08/16 0802   02/08/16 0900  imipenem-cilastatin (PRIMAXIN) 500 mg in sodium chloride 0.9 % 100 mL IVPB  Status:  Discontinued     500 mg 200 mL/hr over 30 Minutes Intravenous Every 6 hours 02/08/16 0818 02/09/16 0812   02/08/16 0830  ertapenem (INVANZ) 1 g in sodium chloride 0.9 % 50 mL IVPB  Status:  Discontinued     1 g 100 mL/hr over 30 Minutes Intravenous Every 24 hours 02/08/16 0756 02/08/16 0812   02/08/16 0830  anidulafungin (ERAXIS) 200 mg in sodium chloride 0.9 % 200 mL IVPB  Status:  Discontinued     200 mg over 180 Minutes Intravenous  Once 02/08/16 0811 02/09/16 1457   02/08/16 0830  vancomycin (VANCOCIN) 1,500 mg in sodium chloride 0.9 % 500 mL IVPB  Status:  Discontinued     1,500 mg 250 mL/hr over 120 Minutes Intravenous  Once 02/08/16 0815 02/09/16 1355   02/08/16 0815  vancomycin (VANCOCIN) 1,500 mg in sodium chloride 0.9 % 500 mL IVPB  Status:  Discontinued     1,500 mg 250 mL/hr over 120 Minutes Intravenous Every 12 hours 02/08/16 0811 02/08/16 0812   02/08/16 0430  piperacillin-tazobactam (ZOSYN) IVPB 3.375 g     3.375 g 100 mL/hr over 30 Minutes Intravenous  Once 02/08/16 0419 02/08/16 0532      Assessment/Plan: s/p Procedure(s): EXPLORATORY LAPAROTOMY WITH CLOSURE (N/A) Leukocytosis  Recheck CBC  May be able to discharge with a PICC line and HHN for IVF replacement  If WBC elevated  May need CT  WILL d/c paragoric   LOS: 16 days    Aijalon Demuro A. 02/24/2016

## 2016-02-24 NOTE — Progress Notes (Signed)
Nutrition Follow-up  DOCUMENTATION CODES:   Severe malnutrition in context of acute illness/injury  INTERVENTION:   -Continue Ensure Enlive po BID, each supplement provides 350 kcal and 20 grams of protein  NUTRITION DIAGNOSIS:   Malnutrition (Severe) related to acute illness (recent SB perforation) as evidenced by severe depletion of muscle mass, severe fluid accumulation, severe depletion of body fat.  Ongoing  GOAL:   Patient will meet greater than or equal to 90% of their needs  Progressing  MONITOR:   PO intake, Supplement acceptance, Diet advancement, Labs, Weight trends, Skin, I & O's  REASON FOR ASSESSMENT:   Consult New TPN/TNA  ASSESSMENT:   Pt with a past medical hx of small bowel perforation on 01/27/16 s/p resection with subsequent wound dehisence. He was d/c on 02/02/16. Developed septic shock, ischemic bowel, abscess. Pt was taken to the OR on 02/08/16 for exploratory surgery, wound vac placement and ileocectomy with open abdomen.   Pt sleeping soundly at time of visit. No family present. RD did not wake.   Pt remains on a dysphagia 2 diet with thin liquids. Appetite remains good; meal completion 50-100%. Also noted multiple empty Ensure bottles on tray table.   Per surgery notes, ostomy output has thickened. Metamucil added on 02/18/16. Per MD notes, plan to d/c once output < 1 L.   Labs reviewed: Na: 134 (on IV supplementation), K: 3.4 (on PO supplementation), Mg: 1.6 (on PO supplementation).   Diet Order:  DIET DYS 2 Room service appropriate? Yes; Fluid consistency: Thin  Skin:  Wound (see comment) (abdominal incision)  Last BM:  02/23/16  Height:   Ht Readings from Last 1 Encounters:  02/08/16 5\' 9"  (1.753 m)    Weight:   Wt Readings from Last 1 Encounters:  02/16/16 123 lb 3.8 oz (55.9 kg)    Ideal Body Weight:  72.7 kg  BMI:  Body mass index is 18.2 kg/m.  Estimated Nutritional Needs:   Kcal:  2100-2300  Protein:  120-140  grams  Fluid:  > 2 L/day  EDUCATION NEEDS:   No education needs identified at this time  Leny Morozov A. Jimmye Norman, RD, LDN, CDE Pager: [phone] After hours Pager: [phone]

## 2016-02-24 NOTE — Progress Notes (Signed)
PROGRESS NOTE    [name]  [id number] DOB: [birth year] DOA: 02/08/2016 PCP: No primary care provider on file.    Brief Narrative:  50 yo male with small bowel perforation, s/p ileocectomy 10/17, presents with abdominal pain. Found to have ischemic bowel and an abscess. Post abscess drainage, developed significant output from colostomy.    Assessment & Plan:   Active Problems:   Abdominal infection ([place])   Peritonitis ([place])   Sepsis ([place])   Sepsis due to Escherichia coli [facility])   Ischemic bowel disease ([place])   Bowel perforation ([place])   Abdominal abscess ([place])   Pleural effusion   Acute kidney injury ([place])   1. High output colostomy bag. Continue high output from colostomy bag, overnight 2045 ml. Will continue IV fluids at 100 cc per hour. Diet as tolerated. No nausea or vomiting. Noted new leukocytosis, will continue to follow cell count in am, patient has been afebrile, if worsening will need further imaging. Follow on surgical recommendations. Continue loperamide and metamucil.  2. Chronic pain syndrome. Pain control with hydromorphone, fentanyl, opana. No signs of sedation. Patient will follow as outpatient with pain clinic.  Will decrease dose of hydromorphone to 0.5 mg and change fentanyl to q 2 hours as needed, continue close observation.   3. Hypokalemia and hypomagnesemia. Continue hydration with saline, will continue to follow up with renal function and electrolytes, will give 40 kcl po x1 and continue magnesium oxide tid. High output colostomy, with high risk for rapid volume depletion and electrolyte disturbances. Serum bicarb at 23.    4. Normocytic anemia. Stable cell count.   5. Resolved sepsis.  Will continue to hold on antibiotic therapy for now, follow on cell count in am. Patient afebrile.   DVT prophylaxis: scd  Code Status: full  Family Communication: I spoke with family at the bedside and all questions were addressed.  Disposition Plan: home     Consultants:   Surgery  Antimicrobials:     Procedures: Exploratory laparotomy with removal of vacuum pack dressing and closure of abdominal wall 02/12/2016  Subjective: Patient with mild to moderate back pain, no nausea or vomiting, no dyspnea or chest pain.   Objective: Vitals:   02/23/16 1427 02/23/16 2051 02/24/16 0136 02/24/16 0703  BP: 105/61 126/75 116/71 106/60  Pulse: 86 99 (!) 121 95  Resp:  18 18 17   Temp: 98.8 F (37.1 C) 100 F (37.8 C) 98.7 F (37.1 C) 98.4 F (36.9 C)  TempSrc: Oral Oral Oral Oral  SpO2: 98% 100% 97% 100%  Weight:      Height:        Intake/Output Summary (Last 24 hours) at 02/24/16 1348 Last data filed at 02/24/16 1206  Gross per 24 hour  Intake          2066.67 ml  Output             4795 ml  Net         -2728.33 ml   Filed Weights   02/13/16 0350 02/14/16 0400 02/16/16 0400  Weight: 82.6 kg (182 lb 1.6 oz) 72.9 kg (160 lb 11.5 oz) 55.9 kg (123 lb 3.8 oz)    Examination:  General exam: not in pain or dyspnea E ENT: mild pallor but no icterus. Oral mucosa moist. Respiratory system: Clear to auscultation. Respiratory effort normal. No wheezing, rales or rhonchi.  Cardiovascular system: S1 & S2 heard, RRR. No JVD, murmurs, rubs, gallops or clicks. No pedal edema. Gastrointestinal system: Abdomen  is nondistended, soft and nontender. No organomegaly or masses felt. Normal bowel sounds heard. Colostomy bag in place with liquid content.  Central nervous system: Alert and oriented. No focal neurological deficits. Extremities: Symmetric 5 x 5 power. Skin: No rashes, lesions or ulcers     Data Reviewed: I have personally reviewed following labs and imaging studies  CBC:  Recent Labs Lab 02/20/16 0534 02/21/16 0415 02/22/16 0605 02/23/16 0425 02/24/16 0427  WBC 8.0 9.1 8.2 11.9* 25.3*  HGB 7.0* 8.5* 9.0* 9.2* 9.7*  HCT 21.0* 25.3* 26.7* 27.5* 29.2*  MCV 79.2 79.8 80.7 82.1 83.0  PLT 343 576* 517* 471* 412*    Basic Metabolic Panel:  Recent Labs Lab 02/20/16 0534 02/21/16 0415 02/22/16 0605 02/23/16 0425 02/24/16 0427  NA 133* 133* 132* 131* 130*  K 3.4* 3.3* 3.1* 3.6 3.4*  CL 104 103 101 98* 100*  CO2 22 21* 22 26 23   GLUCOSE 88 76 80 90 92  BUN 21* 21* 12 10 <5*  CREATININE 0.70 0.63 0.60* 0.57* 0.57*  CALCIUM 7.5* 7.6* 7.9* 8.2* 7.8*  MG 2.0 2.0 1.8 1.9 1.6*  PHOS 4.0 3.6 3.4 3.1 3.2   GFR: Estimated Creatinine Clearance: 88.3 mL/min (by C-G formula based on SCr of 0.57 mg/dL (L)). Liver Function Tests:  Recent Labs Lab 02/18/16 0500 02/19/16 0500 02/20/16 0534  AST 19 19 21   ALT 19 18 18   ALKPHOS 119 123 127*  BILITOT 0.4 0.6 0.6  PROT 4.8* 4.8* 5.1*  ALBUMIN 1.5* 1.6* 1.6*   No results for input(s): LIPASE, AMYLASE in the last 168 hours. No results for input(s): AMMONIA in the last 168 hours. Coagulation Profile: No results for input(s): INR, PROTIME in the last 168 hours. Cardiac Enzymes: No results for input(s): CKTOTAL, CKMB, CKMBINDEX, TROPONINI in the last 168 hours. BNP (last 3 results) No results for input(s): PROBNP in the last 8760 hours. HbA1C: No results for input(s): HGBA1C in the last 72 hours. CBG: No results for input(s): GLUCAP in the last 168 hours. Lipid Profile: No results for input(s): CHOL, HDL, LDLCALC, TRIG, CHOLHDL, LDLDIRECT in the last 72 hours. Thyroid Function Tests: No results for input(s): TSH, T4TOTAL, FREET4, T3FREE, THYROIDAB in the last 72 hours. Anemia Panel: No results for input(s): VITAMINB12, FOLATE, FERRITIN, TIBC, IRON, RETICCTPCT in the last 72 hours. Sepsis Labs: No results for input(s): PROCALCITON, LATICACIDVEN in the last 168 hours.  No results found for this or any previous visit (from the past 240 hour(s)).       Radiology Studies: No results found.      Scheduled Meds: . sodium chloride   Intravenous Once  . feeding supplement (ENSURE ENLIVE)  237 mL Oral BID BM  . loperamide  8 mg Oral QID  .  loratadine  10 mg Oral Daily  . magnesium oxide  400 mg Oral TID  . oxymorphone  40 mg Oral TID  . pantoprazole  40 mg Oral BID AC  . psyllium  1 packet Oral BID   Continuous Infusions: . dextrose 5 % and 0.9% NaCl 100 mL/hr at 02/24/16 0045     LOS: 16 days        Mauricio Gerome Apley, MD [facility] Pager [phone]  If 7PM-7AM, please contact night-coverage [email or web address] Password [email or web address] 02/24/2016, 1:48 PM

## 2016-02-25 ENCOUNTER — Encounter (HOSPITAL_COMMUNITY): Payer: Self-pay | Admitting: Radiology

## 2016-02-25 ENCOUNTER — Inpatient Hospital Stay (HOSPITAL_COMMUNITY): Payer: Worker's Compensation

## 2016-02-25 DIAGNOSIS — E871 Hypo-osmolality and hyponatremia: Secondary | ICD-10-CM

## 2016-02-25 LAB — CBC
HCT: 26.3 % — ABNORMAL LOW (ref 39.0–52.0)
Hemoglobin: 8.6 g/dL — ABNORMAL LOW (ref 13.0–17.0)
MCH: 27.3 pg (ref 26.0–34.0)
MCHC: 32.7 g/dL (ref 30.0–36.0)
MCV: 83.5 fL (ref 78.0–100.0)
PLATELETS: 329 10*3/uL (ref 150–400)
RBC: 3.15 MIL/uL — ABNORMAL LOW (ref 4.22–5.81)
RDW: 17.6 % — AB (ref 11.5–15.5)
WBC: 25.6 10*3/uL — AB (ref 4.0–10.5)

## 2016-02-25 LAB — BASIC METABOLIC PANEL
Anion gap: 7 (ref 5–15)
BUN: 5 mg/dL — AB (ref 6–20)
CHLORIDE: 99 mmol/L — AB (ref 101–111)
CO2: 23 mmol/L (ref 22–32)
CREATININE: 0.53 mg/dL — AB (ref 0.61–1.24)
Calcium: 7.5 mg/dL — ABNORMAL LOW (ref 8.9–10.3)
GFR calc Af Amer: >60 mL/min (ref >60)
GFR calc non Af Amer: >60 mL/min (ref >60)
GLUCOSE: 81 mg/dL (ref 65–99)
POTASSIUM: 3.4 mmol/L — AB (ref 3.5–5.1)
SODIUM: 129 mmol/L — AB (ref 135–145)

## 2016-02-25 LAB — URINALYSIS, ROUTINE W REFLEX MICROSCOPIC
BILIRUBIN URINE: NEGATIVE
GLUCOSE, UA: NEGATIVE mg/dL
HGB URINE DIPSTICK: NEGATIVE
Ketones, ur: NEGATIVE mg/dL
Leukocytes, UA: NEGATIVE
Nitrite: NEGATIVE
PH: 6 (ref 5.0–8.0)
Protein, ur: NEGATIVE mg/dL
SPECIFIC GRAVITY, URINE: 1.006 (ref 1.005–1.030)

## 2016-02-25 LAB — MAGNESIUM: Magnesium: 1.6 mg/dL — ABNORMAL LOW (ref 1.7–2.4)

## 2016-02-25 LAB — PHOSPHORUS: Phosphorus: 2.5 mg/dL (ref 2.5–4.6)

## 2016-02-25 MED ORDER — POTASSIUM & SODIUM PHOSPHATES 280-160-250 MG PO PACK
1.0000 | PACK | Freq: Three times a day (TID) | ORAL | Status: DC
  Administered 2016-02-25 – 2016-02-29 (×18): 1 via ORAL
  Filled 2016-02-25 (×20): qty 1

## 2016-02-25 MED ORDER — IOPAMIDOL (ISOVUE-300) INJECTION 61%
INTRAVENOUS | Status: AC
  Administered 2016-02-25: 100 mL
  Filled 2016-02-25: qty 100

## 2016-02-25 MED ORDER — ACETAMINOPHEN 325 MG PO TABS
650.0000 mg | ORAL_TABLET | Freq: Once | ORAL | Status: AC
  Administered 2016-02-25: 650 mg via ORAL
  Filled 2016-02-25: qty 2

## 2016-02-25 MED ORDER — IOPAMIDOL (ISOVUE-300) INJECTION 61%
INTRAVENOUS | Status: AC
  Filled 2016-02-25: qty 30

## 2016-02-25 MED ORDER — MAGNESIUM SULFATE 4 GM/100ML IV SOLN
4.0000 g | Freq: Once | INTRAVENOUS | Status: AC
  Administered 2016-02-25: 4 g via INTRAVENOUS
  Filled 2016-02-25: qty 100

## 2016-02-25 NOTE — Progress Notes (Signed)
Progress Note: General Surgery Service   Subjective: Complaining of left flank pain, ostomy leaking this morning  Objective: Vital signs in last 24 hours: Temp:  [100.2 F (37.9 C)-101.2 F (38.4 C)] 101.2 F (38.4 C) (11/11 0521) Pulse Rate:  [97-99] 97 (11/11 0521) Resp:  [18] 18 (11/11 0521) BP: (109-113)/(58-64) 113/58 (11/11 0521) SpO2:  [97 %] 97 % (11/10 2108) Last BM Date: 02/24/16  Intake/Output from previous day: 11/10 0701 - 11/11 0700 In: 2958.3 [P.O.:440; I.V.:2518.3] Out: 4200 [Urine:3275; Stool:925] Intake/Output this shift: Total I/O In: -  Out: 600 [Urine:600]  Lungs: CTAB  Cardiovascular: RRR  Abd: soft, slight pain in left side and around ostomy  Extremities: no edema  Neuro: AOx4  Lab Results: CBC   Recent Labs  02/24/16 0427 02/25/16 0450  WBC 25.3* 25.6*  HGB 9.7* 8.6*  HCT 29.2* 26.3*  PLT 412* 329   BMET  Recent Labs  02/24/16 0427 02/25/16 0410  NA 130* 129*  K 3.4* 3.4*  CL 100* 99*  CO2 23 23  GLUCOSE 92 81  BUN <5* 5*  CREATININE 0.57* 0.53*  CALCIUM 7.8* 7.5*   PT/INR No results for input(s): LABPROT, INR in the last 72 hours. ABG No results for input(s): PHART, HCO3 in the last 72 hours.  Invalid input(s): PCO2, PO2  Studies/Results:  Anti-infectives: Anti-infectives    Start     Dose/Rate Route Frequency Ordered Stop   02/18/16 1400  tobramycin (NEBCIN) 490 mg in dextrose 5 % 100 mL IVPB     7 mg/kg  70.7 kg (Ideal) 112.3 mL/hr over 60 Minutes Intravenous Every 24 hours 02/18/16 1041 02/20/16 1453   02/15/16 0031  tigecycline (TYGACIL) 50 mg in sodium chloride 0.9 % 100 mL IVPB     50 mg 200 mL/hr over 30 Minutes Intravenous Every 12 hours 02/14/16 1232 02/20/16 1325   02/14/16 1400  tobramycin (NEBCIN) 510 mg in dextrose 5 % 100 mL IVPB  Status:  Discontinued     7 mg/kg  72.9 kg 112.8 mL/hr over 60 Minutes Intravenous Every 24 hours 02/14/16 1241 02/18/16 1041   02/14/16 1245  tigecycline  (TYGACIL) 100 mg in sodium chloride 0.9 % 100 mL IVPB     100 mg 200 mL/hr over 30 Minutes Intravenous  Once 02/14/16 1232 02/14/16 1342   02/13/16 1200  cefTRIAXone (ROCEPHIN) 2 g in dextrose 5 % 50 mL IVPB  Status:  Discontinued     2 g 100 mL/hr over 30 Minutes Intravenous Every 24 hours 02/13/16 0857 02/14/16 1205   02/13/16 1200  tobramycin (NEBCIN) 530 mg in dextrose 5 % 100 mL IVPB  Status:  Discontinued     7 mg/kg  75.5 kg (Adjusted) 113.3 mL/hr over 60 Minutes Intravenous Every 24 hours 02/13/16 1123 02/14/16 1234   02/10/16 1500  anidulafungin (ERAXIS) 100 mg in sodium chloride 0.9 % 100 mL IVPB     100 mg over 90 Minutes Intravenous Every 24 hours 02/09/16 0804 02/15/16 1556   02/10/16 1500  vancomycin (VANCOCIN) IVPB 1000 mg/200 mL premix  Status:  Discontinued     1,000 mg 200 mL/hr over 60 Minutes Intravenous Every 8 hours 02/10/16 1239 02/13/16 0856   02/10/16 1500  imipenem-cilastatin (PRIMAXIN) 1,000 mg in sodium chloride 0.9 % 250 mL IVPB  Status:  Discontinued     1,000 mg 250 mL/hr over 60 Minutes Intravenous Every 8 hours 02/10/16 1239 02/13/16 0856   02/09/16 1000  imipenem-cilastatin (PRIMAXIN) 1,000 mg in sodium chloride  0.9 % 250 mL IVPB  Status:  Discontinued     1,000 mg 250 mL/hr over 60 Minutes Intravenous Every 8 hours 02/09/16 0812 02/10/16 1239   02/09/16 1000  vancomycin (VANCOCIN) IVPB 1000 mg/200 mL premix  Status:  Discontinued     1,000 mg 200 mL/hr over 60 Minutes Intravenous Every 8 hours 02/09/16 0812 02/10/16 1239   02/09/16 0900  anidulafungin (ERAXIS) 200 mg in sodium chloride 0.9 % 200 mL IVPB     200 mg over 180 Minutes Intravenous  Once 02/09/16 0804 02/09/16 1209   02/09/16 0830  anidulafungin (ERAXIS) 100 mg in sodium chloride 0.9 % 100 mL IVPB  Status:  Discontinued     100 mg over 90 Minutes Intravenous Every 24 hours 02/08/16 0811 02/09/16 0804   02/08/16 1800  vancomycin (VANCOCIN) IVPB 750 mg/150 ml premix  Status:  Discontinued      750 mg 150 mL/hr over 60 Minutes Intravenous Every 8 hours 02/08/16 0847 02/09/16 0812   02/08/16 1400  piperacillin-tazobactam (ZOSYN) IVPB 3.375 g  Status:  Discontinued     3.375 g 12.5 mL/hr over 240 Minutes Intravenous Every 8 hours 02/08/16 0432 02/08/16 0802   02/08/16 0900  imipenem-cilastatin (PRIMAXIN) 500 mg in sodium chloride 0.9 % 100 mL IVPB  Status:  Discontinued     500 mg 200 mL/hr over 30 Minutes Intravenous Every 6 hours 02/08/16 0818 02/09/16 0812   02/08/16 0830  ertapenem (INVANZ) 1 g in sodium chloride 0.9 % 50 mL IVPB  Status:  Discontinued     1 g 100 mL/hr over 30 Minutes Intravenous Every 24 hours 02/08/16 0756 02/08/16 0812   02/08/16 0830  anidulafungin (ERAXIS) 200 mg in sodium chloride 0.9 % 200 mL IVPB  Status:  Discontinued     200 mg over 180 Minutes Intravenous  Once 02/08/16 0811 02/09/16 1457   02/08/16 0830  vancomycin (VANCOCIN) 1,500 mg in sodium chloride 0.9 % 500 mL IVPB  Status:  Discontinued     1,500 mg 250 mL/hr over 120 Minutes Intravenous  Once 02/08/16 0815 02/09/16 1355   02/08/16 0815  vancomycin (VANCOCIN) 1,500 mg in sodium chloride 0.9 % 500 mL IVPB  Status:  Discontinued     1,500 mg 250 mL/hr over 120 Minutes Intravenous Every 12 hours 02/08/16 0811 02/08/16 0812   02/08/16 0430  piperacillin-tazobactam (ZOSYN) IVPB 3.375 g     3.375 g 100 mL/hr over 30 Minutes Intravenous  Once 02/08/16 0419 02/08/16 0532      Medications: Scheduled Meds: . sodium chloride   Intravenous Once  . feeding supplement (ENSURE ENLIVE)  237 mL Oral BID BM  . loperamide  8 mg Oral QID  . loratadine  10 mg Oral Daily  . magnesium oxide  400 mg Oral TID  . magnesium sulfate 1 - 4 g bolus IVPB  4 g Intravenous Once  . oxymorphone  40 mg Oral TID  . pantoprazole  40 mg Oral BID AC  . potassium & sodium phosphates  1 packet Oral TID WC & HS  . psyllium  1 packet Oral BID   Continuous Infusions: . dextrose 5 % and 0.9% NaCl 100 mL/hr at 02/25/16  0751   PRN Meds:.HYDROmorphone (DILAUDID) injection, ondansetron, sodium chloride, sodium chloride flush, sodium chloride flush  Assessment/Plan: Patient Active Problem List   Diagnosis Date Noted  . Ischemic bowel disease ([place])   . Bowel perforation ([place])   . Abdominal abscess ([place])   . Pleural effusion   .  Acute kidney injury ([place])   . Sepsis due to Escherichia coli ([place])   . Sepsis ([place])   . Abdominal infection ([place]) 02/08/2016  . Peritonitis ([place])   . Small bowel perforation ([place]) 01/27/2016   s/p Procedure(s): EXPLORATORY LAPAROTOMY WITH CLOSURE 02/12/2016 Leukocytosis continues to be 25  -UA with UC  -CT ab/pelv to eval for post op complication  Leakage of ostomy   -changed appliance at bedside  -ostomy and area without inflammation, anatomy should be good for pouching even without additional wafer  High output ileostomy  -replace mg and phos today  -stool this morning moderately thick, so should be able to control better    LOS: 17 days   Mickeal Skinner, MD Pg# [phone] [facility]

## 2016-02-25 NOTE — Progress Notes (Signed)
PT Cancellation Note  Patient Details Name: [name] MRN: [id number] DOB: [birth year]   Cancelled Treatment:    Reason Eval/Treat Not Completed: Patient declined, no reason specified. Pt politely declining today due to having a rough night last night with increased back pain today and currently awaiting for radiology to come get him for a MRI. Pt requested PT follow up tomorrow if able. Acute PT to continue and follow up at a later day, tomorrow as time allows.   Willow Ora 02/25/2016, 2:14 PM   Willow Ora, PTA, [place] [address] [place], [place] [address] [phone] 02/25/16, 2:16 PM

## 2016-02-25 NOTE — Progress Notes (Signed)
PROGRESS NOTE    [name]  [id number] DOB: Aug 08, 1965 DOA: 02/08/2016 PCP: No primary care provider on file.    Brief Narrative:  50 yo male with small bowel perforation, s/p ileocectomy 10/17, presents with abdominal pain. Found to have ischemic bowel and an abscess. Post abscess drainage, developed significant output from colostomy. Worsening leukocytosis, follow on ct abdomen and pelvis.    Assessment & Plan:   Active Problems:   Abdominal infection ([place])   Peritonitis ([place])   Sepsis ([place])   Sepsis due to Escherichia coli [facility])   Ischemic bowel disease ([place])   Bowel perforation ([place])   Abdominal abscess ([place])   Pleural effusion   Acute kidney injury ([place])  1. High output colostomy bag. Decrease output form ostomy to 925 ml from 2045 ml. Continue hydration with dextrose and saline at 100 cc/hr. Continue bowel regimen with loperamide, metamucil. Continue pantoprazole. No nausea or vomiting. Tolerating po well. Noted persistent leukocytosis, for CT abdomen and pelvis today.   2. Chronic pain syndrome. Pain control with hydromorphone and  opana. No signs of sedation. Patient will follow as outpatient with pain clinic. Tolerating well decrease dose of hydromorphone, fentanyl has been discontinued.   3. Hyponatremia, Hypokalemia and Hypomagnesemia. Decreased output from ostomy bag to 900 ml from 2045 ml, will continue hydration with saline and dextrose at 100 cc/hr, continue electrolyte repletion with K and mg. Will continue to monitor Na, may need restrict free water ingestion if worsening.   4. Normocytic anemia. Hb down to 8,.6 from 9,7 suspected to be multifactorial, iron panel from last week with serum iron 11 with tibc 196 and transferrin saturation down to 6. Will place patient on iron supplements.   5. Resolved sepsis.  Persistent leukocytosis up to 25.6, will follow on ct abdomen and pelvis, will continue to hold on antibiotic therapy for now.   DVT  prophylaxis:scd  Code Status:full  Family Communication:I spoke with family at the bedside and all questions were addressed.  Disposition Plan:home    Consultants:  Surgery  Antimicrobials:     Procedures: Exploratory laparotomy with removal of vacuum pack dressing and closure of abdominal wall 02/12/2016   Subjective: Patient not in pain or dyspnea, no nausea or vomiting, decrease output from ostomy bag.   Objective: Vitals:   02/24/16 2108 02/24/16 2237 02/25/16 0521 02/25/16 1000  BP: 109/64  (!) 113/58   Pulse: 99  97   Resp: 18  18   Temp: (!) 100.7 F (38.2 C) 100.2 F (37.9 C) (!) 101.2 F (38.4 C) 98.4 F (36.9 C)  TempSrc: Oral Oral Oral   SpO2: 97%     Weight:      Height:        Intake/Output Summary (Last 24 hours) at 02/25/16 1147 Last data filed at 02/25/16 [id number]  Gross per 24 hour  Intake          2958.33 ml  Output             4100 ml  Net         -1141.67 ml   Filed Weights   02/13/16 0350 02/14/16 0400 02/16/16 0400  Weight: 82.6 kg (182 lb 1.6 oz) 72.9 kg (160 lb 11.5 oz) 55.9 kg (123 lb 3.8 oz)    Examination:  General exam: not in pain or dyspnea E ENT: mild pallor but no icterus, oral mucosa moist. Respiratory system: Mild decreased breath sounds at bases, no wheezing, rales or rhonchi. Respiratory effort normal.  Cardiovascular system: S1 & S2 heard, RRR. No JVD, murmurs, rubs, gallops or clicks. No pedal edema. Gastrointestinal system: Abdomen is nondistended and soft, mild tender to palpation. No rebound. No organomegaly or masses felt. Normal bowel sounds heard. Central nervous system: Alert and oriented. No focal neurological deficits. Extremities: Symmetric 5 x 5 power. Skin: No rashes, lesions or ulcers     Data Reviewed: I have personally reviewed following labs and imaging studies  CBC:  Recent Labs Lab 02/21/16 0415 02/22/16 0605 02/23/16 0425 02/24/16 0427 02/25/16 0450  WBC 9.1 8.2 11.9* 25.3* 25.6*    HGB 8.5* 9.0* 9.2* 9.7* 8.6*  HCT 25.3* 26.7* 27.5* 29.2* 26.3*  MCV 79.8 80.7 82.1 83.0 83.5  PLT 576* 517* 471* 412* [id number]   Basic Metabolic Panel:  Recent Labs Lab 02/21/16 0415 02/22/16 0605 02/23/16 0425 02/24/16 0427 02/25/16 0410  NA 133* 132* 131* 130* 129*  K 3.3* 3.1* 3.6 3.4* 3.4*  CL 103 101 98* 100* 99*  CO2 21* 22 26 23 23   GLUCOSE 76 80 90 92 81  BUN 21* 12 10 <5* 5*  CREATININE 0.63 0.60* 0.57* 0.57* 0.53*  CALCIUM 7.6* 7.9* 8.2* 7.8* 7.5*  MG 2.0 1.8 1.9 1.6* 1.6*  PHOS 3.6 3.4 3.1 3.2 2.5   GFR: Estimated Creatinine Clearance: 88.3 mL/min (by C-G formula based on SCr of 0.53 mg/dL (L)). Liver Function Tests:  Recent Labs Lab 02/19/16 0500 02/20/16 0534  AST 19 21  ALT 18 18  ALKPHOS 123 127*  BILITOT 0.6 0.6  PROT 4.8* 5.1*  ALBUMIN 1.6* 1.6*   No results for input(s): LIPASE, AMYLASE in the last 168 hours. No results for input(s): AMMONIA in the last 168 hours. Coagulation Profile: No results for input(s): INR, PROTIME in the last 168 hours. Cardiac Enzymes: No results for input(s): CKTOTAL, CKMB, CKMBINDEX, TROPONINI in the last 168 hours. BNP (last 3 results) No results for input(s): PROBNP in the last 8760 hours. HbA1C: No results for input(s): HGBA1C in the last 72 hours. CBG: No results for input(s): GLUCAP in the last 168 hours. Lipid Profile: No results for input(s): CHOL, HDL, LDLCALC, TRIG, CHOLHDL, LDLDIRECT in the last 72 hours. Thyroid Function Tests: No results for input(s): TSH, T4TOTAL, FREET4, T3FREE, THYROIDAB in the last 72 hours. Anemia Panel: No results for input(s): VITAMINB12, FOLATE, FERRITIN, TIBC, IRON, RETICCTPCT in the last 72 hours. Sepsis Labs: No results for input(s): PROCALCITON, LATICACIDVEN in the last 168 hours.  No results found for this or any previous visit (from the past 240 hour(s)).       Radiology Studies: No results found.      Scheduled Meds: . sodium chloride   Intravenous Once   . feeding supplement (ENSURE ENLIVE)  237 mL Oral BID BM  . iopamidol      . loperamide  8 mg Oral QID  . loratadine  10 mg Oral Daily  . magnesium oxide  400 mg Oral TID  . magnesium sulfate 1 - 4 g bolus IVPB  4 g Intravenous Once  . oxymorphone  40 mg Oral TID  . pantoprazole  40 mg Oral BID AC  . potassium & sodium phosphates  1 packet Oral TID WC & HS  . psyllium  1 packet Oral BID   Continuous Infusions: . dextrose 5 % and 0.9% NaCl 100 mL/hr at 02/25/16 0751     LOS: 17 days        Mauricio Gerome Apley, MD [facility] Pager [phone]  If 7PM-7AM, please contact night-coverage [email or web address] Password [email or web address] 02/25/2016, 11:47 AM

## 2016-02-26 DIAGNOSIS — N179 Acute kidney failure, unspecified: Secondary | ICD-10-CM

## 2016-02-26 LAB — PHOSPHORUS: Phosphorus: 2.8 mg/dL (ref 2.5–4.6)

## 2016-02-26 LAB — CBC WITH DIFFERENTIAL/PLATELET
BASOS PCT: 0 %
Basophils Absolute: 0 10*3/uL (ref 0.0–0.1)
EOS PCT: 1 %
Eosinophils Absolute: 0.1 10*3/uL (ref 0.0–0.7)
HCT: 21 % — ABNORMAL LOW (ref 39.0–52.0)
Hemoglobin: 7.1 g/dL — ABNORMAL LOW (ref 13.0–17.0)
LYMPHS ABS: 1.7 10*3/uL (ref 0.7–4.0)
Lymphocytes Relative: 12 %
MCH: 28.2 pg (ref 26.0–34.0)
MCHC: 33.8 g/dL (ref 30.0–36.0)
MCV: 83.3 fL (ref 78.0–100.0)
MONOS PCT: 17 %
Monocytes Absolute: 2.5 10*3/uL — ABNORMAL HIGH (ref 0.1–1.0)
Neutro Abs: 10.2 10*3/uL — ABNORMAL HIGH (ref 1.7–7.7)
Neutrophils Relative %: 70 %
PLATELETS: 280 10*3/uL (ref 150–400)
RBC: 2.52 MIL/uL — ABNORMAL LOW (ref 4.22–5.81)
RDW: 17.9 % — AB (ref 11.5–15.5)
WBC: 14.5 10*3/uL — ABNORMAL HIGH (ref 4.0–10.5)

## 2016-02-26 LAB — URINE CULTURE: Culture: NO GROWTH

## 2016-02-26 LAB — BASIC METABOLIC PANEL
Anion gap: 7 (ref 5–15)
BUN: 5 mg/dL — AB (ref 6–20)
CALCIUM: 7.4 mg/dL — AB (ref 8.9–10.3)
CO2: 24 mmol/L (ref 22–32)
CREATININE: 0.59 mg/dL — AB (ref 0.61–1.24)
Chloride: 100 mmol/L — ABNORMAL LOW (ref 101–111)
Glucose, Bld: 93 mg/dL (ref 65–99)
Potassium: 2.9 mmol/L — ABNORMAL LOW (ref 3.5–5.1)
SODIUM: 131 mmol/L — AB (ref 135–145)

## 2016-02-26 LAB — MAGNESIUM: MAGNESIUM: 2.1 mg/dL (ref 1.7–2.4)

## 2016-02-26 MED ORDER — ACETAMINOPHEN 325 MG PO TABS
650.0000 mg | ORAL_TABLET | Freq: Four times a day (QID) | ORAL | Status: DC | PRN
  Administered 2016-02-28 – 2016-03-01 (×3): 650 mg via ORAL
  Filled 2016-02-26 (×3): qty 2

## 2016-02-26 MED ORDER — DIPHENOXYLATE-ATROPINE 2.5-0.025 MG PO TABS: 1.0000 | ORAL_TABLET | Freq: Four times a day (QID) | ORAL | Status: DC | PRN

## 2016-02-26 MED ORDER — METRONIDAZOLE IN NACL 5-0.79 MG/ML-% IV SOLN: 500.0000 mg | Freq: Three times a day (TID) | INTRAVENOUS | Status: DC

## 2016-02-26 MED ORDER — HYDROMORPHONE HCL 2 MG/ML IJ SOLN
0.5000 mg | INTRAMUSCULAR | Status: DC | PRN
  Administered 2016-02-26 – 2016-02-29 (×17): 0.5 mg via INTRAVENOUS
  Filled 2016-02-26 (×17): qty 1

## 2016-02-26 MED ORDER — PIPERACILLIN-TAZOBACTAM 3.375 G IVPB
3.3750 g | Freq: Three times a day (TID) | INTRAVENOUS | Status: DC
  Administered 2016-02-26 – 2016-02-27 (×4): 3.375 g via INTRAVENOUS
  Filled 2016-02-26 (×5): qty 50

## 2016-02-26 MED ORDER — POTASSIUM CHLORIDE CRYS ER 20 MEQ PO TBCR
40.0000 meq | EXTENDED_RELEASE_TABLET | Freq: Two times a day (BID) | ORAL | Status: AC
  Administered 2016-02-26 (×2): 40 meq via ORAL
  Filled 2016-02-26 (×2): qty 2

## 2016-02-26 NOTE — Progress Notes (Signed)
PROGRESS NOTE    [name]  [id number] DOB: 02-01-1966 DOA: 02/08/2016 PCP: No primary care provider on file.    Brief Narrative:  50 yo male with small bowel perforation, s/p ileocectomy 10/17, presents with abdominal pain. Found to have ischemic bowel and an abscess. Post abscess drainage, developed significant output from colostomy. Worsening leukocytosis, follow on ct abdomen and pelvis.   Assessment & Plan:   Active Problems:   Abdominal infection ([place])   Peritonitis ([place])   Sepsis ([place])   Sepsis due to Escherichia coli [facility])   Ischemic bowel disease ([place])   Bowel perforation ([place])   Abdominal abscess ([place])   Pleural effusion   Acute kidney injury ([place])   1. High output ileostomy bag.  Continue high output, up to 1850 cc over last 24 hours, will continue fluid repletion with isotonic saline plus dextrose, correcting electrolytes k and mg. Continue loperamide and metamucil. Need to be at least less than 1000 ml before safe discharge.   2. Chronic pain syndrome. Pain control with hydromorphone and  opana. Neuro checks per unit protocol.  3. Hyponatremia, Hypokalemia and Hypomagnesemia. Continue with high output ileostomy bag, urine output 2300, with negative fluid balance 2868 over last 24 hours, will increase fluid to 125 cc/hr, will continue kcl and magnesium oxide.   4. Iron anemia. Hb down to 7.1. Will place patient on po iron supplements, will hold blood transfusion for now, until hb less than 7, no signs of bleeding.    5. New onset of intra-abdominal abscess, multiple. Patient placed on zosyn IV with improvement of leukocytosis, last culture from peritoneum positive for e coli, sensitive to zosyn. Po diet as tolerated.   DVT prophylaxis:scd  Code Status:full  Family Communication:I spoke with family at the bedside and all questions were addressed.  Disposition Plan:home    Consultants:  Surgery   Procedures: Exploratory laparotomy with  removal of vacuum pack dressing and closure of abdominal wall 02/12/2016   Antimicrobials:   Zosyn #1     Subjective: Patient not feeling well this am, no nausea or vomiting. Pain is controlled. Noted persistent high output from ostomy bag. CT abdomen and pelvis with multiple abscess foci, started on antibiotic therapy.   Objective: Vitals:   02/25/16 1500 02/25/16 2046 02/26/16 0445 02/26/16 0800  BP: 110/62 132/61 (!) 113/48   Pulse: 96 98 78   Resp:  20 18   Temp: 99 F (37.2 C) 99.6 F (37.6 C) 100.3 F (37.9 C) 98.9 F (37.2 C)  TempSrc: Oral Oral Oral   SpO2:  100% 93%   Weight:      Height:        Intake/Output Summary (Last 24 hours) at 02/26/16 1128 Last data filed at 02/26/16 1030  Gross per 24 hour  Intake          1281.67 ml  Output             5550 ml  Net         -4268.33 ml   Filed Weights   02/13/16 0350 02/14/16 0400 02/16/16 0400  Weight: 82.6 kg (182 lb 1.6 oz) 72.9 kg (160 lb 11.5 oz) 55.9 kg (123 lb 3.8 oz)    Examination:  General exam: deconditioned and ill looking appearing E ENT. Positive pallor, oral mucosa dry, no icterus.   Respiratory system: No wheezing, rales or rhonchi.  Respiratory effort normal. Cardiovascular system: S1 & S2 heard, RRR. No JVD, murmurs, rubs, gallops or clicks. No  pedal edema. Gastrointestinal system: Abdomen is nondistended, soft and nontender. No organomegaly or masses felt. Normal bowel sounds heard. Central nervous system: Alert and oriented. No focal neurological deficits. Extremities: Symmetric 5 x 5 power. Skin: No rashes, lesions or ulcers      Data Reviewed: I have personally reviewed following labs and imaging studies  CBC:  Recent Labs Lab 02/22/16 0605 02/23/16 0425 02/24/16 0427 02/25/16 0450 02/26/16 0500  WBC 8.2 11.9* 25.3* 25.6* 14.5*  NEUTROABS  --   --   --   --  10.2*  HGB 9.0* 9.2* 9.7* 8.6* 7.1*  HCT 26.7* 27.5* 29.2* 26.3* 21.0*  MCV 80.7 82.1 83.0 83.5 83.3  PLT 517*  471* 412* 329 [id number]   Basic Metabolic Panel:  Recent Labs Lab 02/22/16 0605 02/23/16 0425 02/24/16 0427 02/25/16 0410 02/26/16 0500  NA 132* 131* 130* 129* 131*  K 3.1* 3.6 3.4* 3.4* 2.9*  CL 101 98* 100* 99* 100*  CO2 22 26 23 23 24   GLUCOSE 80 90 92 81 93  BUN 12 10 <5* 5* 5*  CREATININE 0.60* 0.57* 0.57* 0.53* 0.59*  CALCIUM 7.9* 8.2* 7.8* 7.5* 7.4*  MG 1.8 1.9 1.6* 1.6* 2.1  PHOS 3.4 3.1 3.2 2.5 2.8   GFR: Estimated Creatinine Clearance: 88.3 mL/min (by C-G formula based on SCr of 0.59 mg/dL (L)). Liver Function Tests:  Recent Labs Lab 02/20/16 0534  AST 21  ALT 18  ALKPHOS 127*  BILITOT 0.6  PROT 5.1*  ALBUMIN 1.6*   No results for input(s): LIPASE, AMYLASE in the last 168 hours. No results for input(s): AMMONIA in the last 168 hours. Coagulation Profile: No results for input(s): INR, PROTIME in the last 168 hours. Cardiac Enzymes: No results for input(s): CKTOTAL, CKMB, CKMBINDEX, TROPONINI in the last 168 hours. BNP (last 3 results) No results for input(s): PROBNP in the last 8760 hours. HbA1C: No results for input(s): HGBA1C in the last 72 hours. CBG: No results for input(s): GLUCAP in the last 168 hours. Lipid Profile: No results for input(s): CHOL, HDL, LDLCALC, TRIG, CHOLHDL, LDLDIRECT in the last 72 hours. Thyroid Function Tests: No results for input(s): TSH, T4TOTAL, FREET4, T3FREE, THYROIDAB in the last 72 hours. Anemia Panel: No results for input(s): VITAMINB12, FOLATE, FERRITIN, TIBC, IRON, RETICCTPCT in the last 72 hours. Sepsis Labs: No results for input(s): PROCALCITON, LATICACIDVEN in the last 168 hours.  No results found for this or any previous visit (from the past 240 hour(s)).       Radiology Studies: Ct Abdomen Pelvis W Contrast  Result Date: 02/25/2016 CLINICAL DATA:  50 year old male inpatient with distal small bowel perforation status post emergent ileoectomy [id number], complicated by ileocolic anastomotic leak with  extensive peritoneal cavity abscess formation status post exploratory laparotomy, drainage of the peritoneal abscesses, resection of ileocolonic anastomosis 02/08/2016, with resection of ischemic distal ileum and ileostomy creation 02/10/2016, with closure of abdominal wall 02/12/2016, now with worsening leukocytosis. EXAM: CT ABDOMEN AND PELVIS WITH CONTRAST TECHNIQUE: Multidetector CT imaging of the abdomen and pelvis was performed using the standard protocol following bolus administration of intravenous contrast. CONTRAST:  145mL ISOVUE-300 IOPAMIDOL (ISOVUE-300) INJECTION 61% COMPARISON:  02/08/2016 CT abdomen/ pelvis. FINDINGS: Lower chest: Small left pleural effusion. No right pleural effusion. Trace pericardial effusion/ thickening, slightly increased. Patchy bandlike consolidation in the peripheral basilar left lower lobe with associated volume loss, mildly increased. Small subpleural bleb in the basilar left lower lobe appears smaller. Hepatobiliary: Normal liver size. Hypodense 2.1 x 2.1 cm segment 3  left liver lobe lesion (series 201/ image 24), previously 1.8 x 1.7 cm, mildly increased. At least four scattered subcapsular poorly marginated low-attenuation liver lesions, largest 4.5 x 2.6 cm at the left liver dome (series 201/ image 12), not appreciably changed. Subcentimeter right liver lobe hypodense lesion is too small to characterize and stable. No new liver lesions. Normal gallbladder with no radiopaque cholelithiasis. No biliary ductal dilatation. Pancreas: Normal, with no mass or duct dilation. Spleen: Normal size spleen. New subcapsular 3.8 x 2.3 x 4.5 cm collection in the peripheral upper spleen (series 201/image 10). Adrenals/Urinary Tract: Normal adrenals. No hydronephrosis. Stable mild scarring in the posterior upper right kidney versus developmental junctional cortical defect. No renal masses. Normal bladder. Stomach/Bowel: Grossly normal stomach. Patient is status post ileocecal and distal  small bowel resection with ventral right abdominal wall end ileostomy. Remnant small-bowel loops are top-normal in caliber. Oral contrast progresses to the ileostomy bag. There is mild diffuse small bowel wall thickening without appreciable pneumatosis. The remnant proximally blind-ending large-bowel is collapsed with diffuse large bowel wall thickening and no appreciable pneumatosis. Vascular/Lymphatic: Normal caliber abdominal aorta. Patent portal, splenic, hepatic and renal veins. Stable mild aortocaval, gastrohepatic ligament, paraceliac and left para-aortic retroperitoneal lymphadenopathy, measuring up to the 1.3 cm in the gastrohepatic ligament (series 201/ image 15), 1.1 cm in the aortocaval chain (series 201/ image 27) and 1.3 cm in the left para-aortic chain (series 201/ image 29). Stable mild right mesenteric adenopathy measuring up to 1.1 cm (series 201/ image 33). No new pathologically enlarged abdominopelvic nodes. Reproductive: Normal size prostate. Other: No pneumoperitoneum. Persistent fat stranding and ill-defined fluid throughout the peritoneal cavity. Small 2.8 x 1.9 x 2.3 cm thick walled fluid collection in the posterior right lower peritoneal cavity abutting the right psoas muscle laterally (series 201/ image 47). Small 2.3 x 1.6 x 1.6 cm thick walled fluid collection in the central pelvis between the sigmoid colon and pelvic small bowel (series 201/ image 65). Midline laparotomy. No superficial fluid collections. Musculoskeletal: No aggressive appearing focal osseous lesions. Moderate lumbar spondylosis. Stable postsurgical changes from anterior spinal fusion at L3-4 and L4-5. Partially visualized spinal fusion hardware in the left lower thoracic spine. IMPRESSION: 1. Persistent diffuse peritonitis. Two small peritoneal abscesses as described in the posterior right lower quadrant and central pelvis. 2. New small low attenuation subcapsular collection in the peripheral upper spleen, possibly a  subcapsular splenic abscess or infarct. 3. Mild growth of a 2.1 cm low-attenuation segment 3 left liver lobe mass, which could represent an hepatic abscess. 4. Additional low-attenuation liver lesions are stable and indeterminate and warrant outpatient evaluation with MRI abdomen without and with IV contrast when feasible. 5. No evidence of small bowel obstruction. Diffuse small and large bowel wall thickening probably due to peritonitis, with no specific findings of bowel ischemia (no pneumoperitoneum and no appreciable pneumatosis). 6. Small left pleural effusion. Patchy bandlike consolidation and volume loss in the peripheral basilar left lower lobe, favor atelectasis. 7. Trace pericardial effusion/thickening, slightly increased. Electronically Signed   By: Ilona Sorrel M.D.   On: 02/25/2016 16:17        Scheduled Meds: . sodium chloride   Intravenous Once  . feeding supplement (ENSURE ENLIVE)  237 mL Oral BID BM  . loperamide  8 mg Oral QID  . loratadine  10 mg Oral Daily  . magnesium oxide  400 mg Oral TID  . oxymorphone  40 mg Oral TID  . pantoprazole  40 mg Oral BID AC  .  piperacillin-tazobactam (ZOSYN)  IV  3.375 g Intravenous Q8H  . potassium & sodium phosphates  1 packet Oral TID WC & HS  . potassium chloride  40 mEq Oral BID  . psyllium  1 packet Oral BID   Continuous Infusions: . dextrose 5 % and 0.9% NaCl 100 mL/hr at 02/26/16 0748     LOS: 18 days        Cambrea Kirt Gerome Apley, MD [facility] Pager [phone]  If 7PM-7AM, please contact night-coverage [email or web address] Password [email or web address] 02/26/2016, 11:28 AM

## 2016-02-26 NOTE — Progress Notes (Signed)
Progress Note: General Surgery Service   Subjective: Complaining of pain and feeling "in a time warp". Ostomy output was 1850 yesterday.   Objective: Vital signs in last 24 hours: Temp:  [98.9 F (37.2 C)-100.3 F (37.9 C)] 98.9 F (37.2 C) (11/12 0800) Pulse Rate:  [78-98] 78 (11/12 0445) Resp:  [18-20] 18 (11/12 0445) BP: (110-132)/(48-62) 113/48 (11/12 0445) SpO2:  [93 %-100 %] 93 % (11/12 0445) Last BM Date: 02/25/16  Intake/Output from previous day: 11/11 0701 - 11/12 0700 In: 1281.7 [I.V.:1281.7] Out: 4150 [Urine:2300; Stool:1850] Intake/Output this shift: Total I/O In: -  Out: 1350 [Urine:1000; Stool:350]  Lungs: CTAB  Cardiovascular: RRR  Abd: soft, slight pain in left side and around ostomy  Extremities: no edema  Neuro: AOx4  Lab Results: CBC   Recent Labs  02/25/16 0450 02/26/16 0500  WBC 25.6* 14.5*  HGB 8.6* 7.1*  HCT 26.3* 21.0*  PLT 329 280   BMET  Recent Labs  02/25/16 0410 02/26/16 0500  NA 129* 131*  K 3.4* 2.9*  CL 99* 100*  CO2 23 24  GLUCOSE 81 93  BUN 5* 5*  CREATININE 0.53* 0.59*  CALCIUM 7.5* 7.4*   PT/INR No results for input(s): LABPROT, INR in the last 72 hours. ABG No results for input(s): PHART, HCO3 in the last 72 hours.  Invalid input(s): PCO2, PO2  Studies/Results:  Anti-infectives: Anti-infectives    Start     Dose/Rate Route Frequency Ordered Stop   02/26/16 1045  metroNIDAZOLE (FLAGYL) IVPB 500 mg     500 mg 100 mL/hr over 60 Minutes Intravenous Every 8 hours 02/26/16 1038     02/18/16 1400  tobramycin (NEBCIN) 490 mg in dextrose 5 % 100 mL IVPB     7 mg/kg  70.7 kg (Ideal) 112.3 mL/hr over 60 Minutes Intravenous Every 24 hours 02/18/16 1041 02/20/16 1453   02/15/16 0031  tigecycline (TYGACIL) 50 mg in sodium chloride 0.9 % 100 mL IVPB     50 mg 200 mL/hr over 30 Minutes Intravenous Every 12 hours 02/14/16 1232 02/20/16 1325   02/14/16 1400  tobramycin (NEBCIN) 510 mg in dextrose 5 % 100 mL IVPB   Status:  Discontinued     7 mg/kg  72.9 kg 112.8 mL/hr over 60 Minutes Intravenous Every 24 hours 02/14/16 1241 02/18/16 1041   02/14/16 1245  tigecycline (TYGACIL) 100 mg in sodium chloride 0.9 % 100 mL IVPB     100 mg 200 mL/hr over 30 Minutes Intravenous  Once 02/14/16 1232 02/14/16 1342   02/13/16 1200  cefTRIAXone (ROCEPHIN) 2 g in dextrose 5 % 50 mL IVPB  Status:  Discontinued     2 g 100 mL/hr over 30 Minutes Intravenous Every 24 hours 02/13/16 0857 02/14/16 1205   02/13/16 1200  tobramycin (NEBCIN) 530 mg in dextrose 5 % 100 mL IVPB  Status:  Discontinued     7 mg/kg  75.5 kg (Adjusted) 113.3 mL/hr over 60 Minutes Intravenous Every 24 hours 02/13/16 1123 02/14/16 1234   02/10/16 1500  anidulafungin (ERAXIS) 100 mg in sodium chloride 0.9 % 100 mL IVPB     100 mg over 90 Minutes Intravenous Every 24 hours 02/09/16 0804 02/15/16 1556   02/10/16 1500  vancomycin (VANCOCIN) IVPB 1000 mg/200 mL premix  Status:  Discontinued     1,000 mg 200 mL/hr over 60 Minutes Intravenous Every 8 hours 02/10/16 1239 02/13/16 0856   02/10/16 1500  imipenem-cilastatin (PRIMAXIN) 1,000 mg in sodium chloride 0.9 % 250 mL  IVPB  Status:  Discontinued     1,000 mg 250 mL/hr over 60 Minutes Intravenous Every 8 hours 02/10/16 1239 02/13/16 0856   02/09/16 1000  imipenem-cilastatin (PRIMAXIN) 1,000 mg in sodium chloride 0.9 % 250 mL IVPB  Status:  Discontinued     1,000 mg 250 mL/hr over 60 Minutes Intravenous Every 8 hours 02/09/16 0812 02/10/16 1239   02/09/16 1000  vancomycin (VANCOCIN) IVPB 1000 mg/200 mL premix  Status:  Discontinued     1,000 mg 200 mL/hr over 60 Minutes Intravenous Every 8 hours 02/09/16 0812 02/10/16 1239   02/09/16 0900  anidulafungin (ERAXIS) 200 mg in sodium chloride 0.9 % 200 mL IVPB     200 mg over 180 Minutes Intravenous  Once 02/09/16 0804 02/09/16 1209   02/09/16 0830  anidulafungin (ERAXIS) 100 mg in sodium chloride 0.9 % 100 mL IVPB  Status:  Discontinued     100 mg over  90 Minutes Intravenous Every 24 hours 02/08/16 0811 02/09/16 0804   02/08/16 1800  vancomycin (VANCOCIN) IVPB 750 mg/150 ml premix  Status:  Discontinued     750 mg 150 mL/hr over 60 Minutes Intravenous Every 8 hours 02/08/16 0847 02/09/16 0812   02/08/16 1400  piperacillin-tazobactam (ZOSYN) IVPB 3.375 g  Status:  Discontinued     3.375 g 12.5 mL/hr over 240 Minutes Intravenous Every 8 hours 02/08/16 0432 02/08/16 0802   02/08/16 0900  imipenem-cilastatin (PRIMAXIN) 500 mg in sodium chloride 0.9 % 100 mL IVPB  Status:  Discontinued     500 mg 200 mL/hr over 30 Minutes Intravenous Every 6 hours 02/08/16 0818 02/09/16 0812   02/08/16 0830  ertapenem (INVANZ) 1 g in sodium chloride 0.9 % 50 mL IVPB  Status:  Discontinued     1 g 100 mL/hr over 30 Minutes Intravenous Every 24 hours 02/08/16 0756 02/08/16 0812   02/08/16 0830  anidulafungin (ERAXIS) 200 mg in sodium chloride 0.9 % 200 mL IVPB  Status:  Discontinued     200 mg over 180 Minutes Intravenous  Once 02/08/16 0811 02/09/16 1457   02/08/16 0830  vancomycin (VANCOCIN) 1,500 mg in sodium chloride 0.9 % 500 mL IVPB  Status:  Discontinued     1,500 mg 250 mL/hr over 120 Minutes Intravenous  Once 02/08/16 0815 02/09/16 1355   02/08/16 0815  vancomycin (VANCOCIN) 1,500 mg in sodium chloride 0.9 % 500 mL IVPB  Status:  Discontinued     1,500 mg 250 mL/hr over 120 Minutes Intravenous Every 12 hours 02/08/16 0811 02/08/16 0812   02/08/16 0430  piperacillin-tazobactam (ZOSYN) IVPB 3.375 g     3.375 g 100 mL/hr over 30 Minutes Intravenous  Once 02/08/16 0419 02/08/16 0532      Medications: Scheduled Meds: . sodium chloride   Intravenous Once  . feeding supplement (ENSURE ENLIVE)  237 mL Oral BID BM  . loperamide  8 mg Oral QID  . loratadine  10 mg Oral Daily  . magnesium oxide  400 mg Oral TID  . metronidazole  500 mg Intravenous Q8H  . oxymorphone  40 mg Oral TID  . pantoprazole  40 mg Oral BID AC  . potassium & sodium phosphates  1  packet Oral TID WC & HS  . psyllium  1 packet Oral BID   Continuous Infusions: . dextrose 5 % and 0.9% NaCl 100 mL/hr at 02/26/16 0748   PRN Meds:.acetaminophen, HYDROmorphone (DILAUDID) injection, ondansetron, sodium chloride, sodium chloride flush, sodium chloride flush  Assessment/Plan: Patient Active Problem  List   Diagnosis Date Noted  . Ischemic bowel disease ([place])   . Bowel perforation ([place])   . Abdominal abscess ([place])   . Pleural effusion   . Acute kidney injury ([place])   . Sepsis due to Escherichia coli ([place])   . Sepsis ([place])   . Abdominal infection ([place]) 02/08/2016  . Peritonitis ([place])   . Small bowel perforation ([place]) 01/27/2016   s/p Procedure(s): EXPLORATORY LAPAROTOMY WITH CLOSURE 02/12/2016 Leukocytosis down to 14  -CT ab/pelv with multiple small fluid collections. Started zosyn/flagyl.   Leakage of ostomy resolved  -ostomy and area without inflammation, anatomy should be good for pouching even without additional wafer  High output ileostomy  -replace mg and phos today  -uptitrate lomotil/ immodium for goal output <1L daily    LOS: 18 days   Clovis Riley, MD [facility]

## 2016-02-26 NOTE — Progress Notes (Signed)
PT Cancellation Note  Patient Details Name: MAMOUN SPEARMAN MRN: [id number] DOB: [birth year]   Cancelled Treatment:    Reason Eval/Treat Not Completed: Patient declined, citing pain (had pain meds earlier) and stating he feels "lost" and "in a time warp".  Nursing notified.  Will check back tomorrow.   Zakhi Dupre LUBECK 02/26/2016, 10:35 AM

## 2016-02-26 NOTE — Progress Notes (Signed)
Pharmacy Antibiotic Note  [name] is a 50 y.o. male admitted on 02/08/2016 with intra-abdominal infection.  Pharmacy has been consulted for Zosyn dosing. Patient has a complex recent antimicrobial history; completed 13 days of abx on 11/6. Pt had leukocytosis up to 25.6 on 11/11, now down to 14.6. Afeb, CrCl ~ 88 ml/min  Plan: Zosyn 3.375g IV q8h (4 hour infusion).  Monitor renal fxn, clinical status, culture data, abx LOT  Height: 5\' 9"  (175.3 cm) Weight: 123 lb 3.8 oz (55.9 kg) IBW/kg (Calculated) : 70.7  Temp (24hrs), Avg:99.5 F (37.5 C), Min:98.9 F (37.2 C), Max:100.3 F (37.9 C)   Recent Labs Lab 02/22/16 0605 02/23/16 0425 02/24/16 0427 02/25/16 0410 02/25/16 0450 02/26/16 0500  WBC 8.2 11.9* 25.3*  --  25.6* 14.5*  CREATININE 0.60* 0.57* 0.57* 0.53*  --  0.59*    Estimated Creatinine Clearance: 88.3 mL/min (by C-G formula based on SCr of 0.59 mg/dL (L)).    Allergies  Allergen Reactions  . Celecoxib     Other reaction(s): IRRITABILITY  . Clonazepam Itching  . Cyclobenzaprine     Other reaction(s): IRRITABILITY  . Dexamethasone     Other reaction(s): IRRITABILITY  . Diazepam     Other reaction(s): IRRITABILITY  . Etodolac     Other reaction(s): IRRITABILITY  . Gabapentin     Other reaction(s): IRRITABILITY  . Metaxalone     Other reaction(s): IRRITABILITY  . Methocarbamol     Other reaction(s): IRRITABILITY  . Pregabalin     Other reaction(s): IRRITABILITY  . Propoxyphene     Other reaction(s): IRRITABILITY  . Tramadol     Other reaction(s): IRRITABILITY    Antimicrobials this admission: Zosyn x1 10/25 Vanc 10/25>>10/30 Primaxin 10/25>>10/30 Eraxis 10/26>>11/1 CTX 10/30>>10/31 Tobramycin 10/30>>11/6 Tigecycline 10/31>>11/6 Zosyn 11/12>>  Microbiology results: 10/13 Abscess: E. coli (R-amp, unasyn) 10/25 MRSA PCR: neg 10/25 BCx: no growth final 10/25 Peritoneal: E. coli (R-amp, unasyn); Acinetobacter (pan-resistant except  I-gent) 10/25 UCx: no growth final 11/11 UCx: sent  Thank you for allowing pharmacy to be a part of this patient's care.  Gwenlyn Perking, PharmD PGY1 Pharmacy Resident Pager: [phone] 02/26/2016 11:04 AM

## 2016-02-27 LAB — CBC WITH DIFFERENTIAL/PLATELET
Basophils Absolute: 0.1 10*3/uL (ref 0.0–0.1)
Basophils Relative: 1 %
EOS ABS: 0.3 10*3/uL (ref 0.0–0.7)
EOS PCT: 3 %
HCT: 23.5 % — ABNORMAL LOW (ref 39.0–52.0)
Hemoglobin: 7.7 g/dL — ABNORMAL LOW (ref 13.0–17.0)
LYMPHS ABS: 1.2 10*3/uL (ref 0.7–4.0)
LYMPHS PCT: 12 %
MCH: 27.4 pg (ref 26.0–34.0)
MCHC: 32.8 g/dL (ref 30.0–36.0)
MCV: 83.6 fL (ref 78.0–100.0)
MONO ABS: 1.4 10*3/uL — AB (ref 0.1–1.0)
MONOS PCT: 14 %
Neutro Abs: 7.1 10*3/uL (ref 1.7–7.7)
Neutrophils Relative %: 70 %
PLATELETS: 355 10*3/uL (ref 150–400)
RBC: 2.81 MIL/uL — AB (ref 4.22–5.81)
RDW: 17.5 % — AB (ref 11.5–15.5)
WBC: 10 10*3/uL (ref 4.0–10.5)

## 2016-02-27 LAB — BASIC METABOLIC PANEL
Anion gap: 7 (ref 5–15)
CO2: 25 mmol/L (ref 22–32)
CREATININE: 0.55 mg/dL — AB (ref 0.61–1.24)
Calcium: 7.9 mg/dL — ABNORMAL LOW (ref 8.9–10.3)
Chloride: 102 mmol/L (ref 101–111)
GFR calc Af Amer: >60 mL/min (ref >60)
GLUCOSE: 110 mg/dL — AB (ref 65–99)
POTASSIUM: 3.2 mmol/L — AB (ref 3.5–5.1)
SODIUM: 134 mmol/L — AB (ref 135–145)

## 2016-02-27 LAB — PHOSPHORUS: Phosphorus: 3 mg/dL (ref 2.5–4.6)

## 2016-02-27 LAB — MAGNESIUM: Magnesium: 1.9 mg/dL (ref 1.7–2.4)

## 2016-02-27 MED ORDER — DIPHENOXYLATE-ATROPINE 2.5-0.025 MG PO TABS
1.0000 | ORAL_TABLET | Freq: Four times a day (QID) | ORAL | Status: DC
  Administered 2016-02-27 – 2016-02-28 (×7): 1 via ORAL
  Filled 2016-02-27 (×7): qty 1

## 2016-02-27 MED ORDER — FERROUS SULFATE 325 (65 FE) MG PO TABS
325.0000 mg | ORAL_TABLET | Freq: Three times a day (TID) | ORAL | Status: DC
  Administered 2016-02-27 – 2016-03-04 (×19): 325 mg via ORAL
  Filled 2016-02-27 (×19): qty 1

## 2016-02-27 MED ORDER — POTASSIUM CHLORIDE CRYS ER 20 MEQ PO TBCR
40.0000 meq | EXTENDED_RELEASE_TABLET | Freq: Three times a day (TID) | ORAL | Status: DC
  Administered 2016-02-27 – 2016-02-28 (×5): 40 meq via ORAL
  Filled 2016-02-27 (×6): qty 2

## 2016-02-27 MED ORDER — AMOXICILLIN-POT CLAVULANATE 875-125 MG PO TABS
1.0000 | ORAL_TABLET | Freq: Two times a day (BID) | ORAL | Status: DC
  Administered 2016-02-27 – 2016-03-02 (×9): 1 via ORAL
  Filled 2016-02-27 (×9): qty 1

## 2016-02-27 MED ORDER — PSYLLIUM 95 % PO PACK
1.0000 | PACK | Freq: Three times a day (TID) | ORAL | Status: DC
  Administered 2016-02-27 – 2016-02-29 (×7): 1 via ORAL
  Filled 2016-02-27 (×9): qty 1

## 2016-02-27 MED ORDER — DM-GUAIFENESIN ER 30-600 MG PO TB12
1.0000 | ORAL_TABLET | Freq: Two times a day (BID) | ORAL | Status: DC | PRN
  Administered 2016-02-27 – 2016-03-01 (×2): 1 via ORAL
  Filled 2016-02-27 (×2): qty 1

## 2016-02-27 MED ORDER — POTASSIUM CHLORIDE CRYS ER 20 MEQ PO TBCR: 40.0000 meq | EXTENDED_RELEASE_TABLET | Freq: Two times a day (BID) | ORAL | Status: DC

## 2016-02-27 MED ORDER — PANTOPRAZOLE SODIUM 40 MG PO TBEC
40.0000 mg | DELAYED_RELEASE_TABLET | Freq: Every day | ORAL | Status: DC
  Administered 2016-02-28 – 2016-03-04 (×6): 40 mg via ORAL
  Filled 2016-02-27 (×6): qty 1

## 2016-02-27 NOTE — Consult Note (Signed)
[place] Nurse ostomy follow up Stoma type/location: RLQ, end ileostomy Stomal assessment/size: 1 1/8" oval shaped, budded, os at center Peristomal assessment: did not change pouch today Treatment options for stomal/peristomal skin: we have been crusting denuded skin with antifungal powder Output liquid green but a little thicker than when I saw patient Friday am Ostomy pouching: 1pc. Placed per patient "by the head surgeon" on Friday, however it appears was placed by Dr. Kieth Brightly on Saturday am Education provided: discussed dehydration again with patient and emptying when 1/3 to 1/2 full to be most successful with pouching. Enrolled patient in [place] Start Discharge program: Yes Patient requested per workman's comp CM that ostomy nurse dc patient with 2 wks of supplies.  I have ordered 4 1pc pouches and 4 barrier rings today for this patient.  He is noted to have 3 pouches and 6 barrier rings in the room for use.   [place] Nurse will follow along with you for continued support with ostomy teaching and care Prabhjot Maddux [facility], RN, [place], [place]

## 2016-02-27 NOTE — Progress Notes (Signed)
PROGRESS NOTE    [name]  [id number] DOB: 1965/08/17 DOA: 02/08/2016 PCP: No primary care provider on file.   Brief Narrative:  50 yo male with small bowel perforation, s/p ileocectomy 10/17, presents with abdominal pain. Found to have ischemic bowel and an abscess. Post abscess drainage, developed significant output from colostomy. Worsening leukocytosis, follow on ct abdomen and pelvis, showed multiple abdominal abscessed, resumed on antibiotic therapy.    Assessment & Plan:   Active Problems:   Abdominal infection ([place])   Peritonitis ([place])   Sepsis ([place])   Sepsis due to Escherichia coli [facility])   Ischemic bowel disease ([place])   Bowel perforation ([place])   Abdominal abscess ([place])   Pleural effusion   Acute kidney injury ([place])   1. High output ileostomy bag.  Worsening output from ileostomy bag, now reached 4080 ml, will continue IV fluids, for repletion, patient continue negative fluid balance down to -4246 ml over last 24 hours. Will continue IV fluids and electrolyte correction. Loperamide per surgery recommendations.    2. Chronic pain syndrome. On as needed hydromorphone and opana. No signs of sedation.  3. Hyponatremia, Hypokalemia and Hypomagnesemia. Patient with high free water intake, will continue with isotonic saline at 125 cc.hr, continue k repletion with kcl, and mag with mag oxide.   4. Iron anemia. Hb stable at 7,7.  Started on iron supplements. Possible hemodilution component due to IV fluids.   5. New onset of intra-abdominal abscess, multiple. Leukocytosis continue to improve, will continue antibiotic therapy with augmentin.    DVT prophylaxis:scd  Code Status:full  Family Communication:I spoke with family at the bedside and all questions were addressed.  Disposition Plan:home    Consultants:  Surgery   Procedures: Exploratory laparotomy with removal of vacuum pack dressing and closure of abdominal wall  02/12/2016   Antimicrobials:   Zosyn #1 (total 1)  Augmentin #0  Subjective: Patient with no chest pain, no nausea or vomiting, no abdominal pain. Persistent large output from ostomy bag.   Objective: Vitals:   02/26/16 0800 02/26/16 2300 02/27/16 0505 02/27/16 1145  BP:  (!) 108/54 (!) 101/51 112/73  Pulse:  79 80 87  Resp:  17 16   Temp: 98.9 F (37.2 C) 98.7 F (37.1 C) 99.5 F (37.5 C) 98.5 F (36.9 C)  TempSrc:  Oral Oral Oral  SpO2:  99% 100% 98%  Weight:      Height:        Intake/Output Summary (Last 24 hours) at 02/27/16 1754 Last data filed at 02/27/16 1415  Gross per 24 hour  Intake               10 ml  Output             4200 ml  Net            -4190 ml   Filed Weights   02/13/16 0350 02/14/16 0400 02/16/16 0400  Weight: 82.6 kg (182 lb 1.6 oz) 72.9 kg (160 lb 11.5 oz) 55.9 kg (123 lb 3.8 oz)    Examination:  General exam: patient deconditioned, not in pain or dyspnea E ENT: no pallor or icterus, oral mucosa moist. Respiratory system: Clear to auscultation. Respiratory effort normal. No wheezing, rales or rhochi.  Cardiovascular system: S1 & S2 heard, RRR. No JVD, murmurs, rubs, gallops or clicks. No pedal edema. Gastrointestinal system: Abdomen is nondistended, soft and nontender. No organomegaly or masses felt. Normal bowel sounds heard. Ostomy bag in place with large  amount of green fluid.  Central nervous system: Alert and oriented. No focal neurological deficits. Extremities: Symmetric 5 x 5 power. Skin: No rashes, lesions or ulcers     Data Reviewed: I have personally reviewed following labs and imaging studies  CBC:  Recent Labs Lab 02/23/16 0425 02/24/16 0427 02/25/16 0450 02/26/16 0500 02/27/16 0440  WBC 11.9* 25.3* 25.6* 14.5* 10.0  NEUTROABS  --   --   --  10.2* 7.1  HGB 9.2* 9.7* 8.6* 7.1* 7.7*  HCT 27.5* 29.2* 26.3* 21.0* 23.5*  MCV 82.1 83.0 83.5 83.3 83.6  PLT 471* 412* 329 280 [id number]   Basic Metabolic Panel:  Recent  Labs Lab 02/23/16 0425 02/24/16 0427 02/25/16 0410 02/26/16 0500 02/27/16 0440  NA 131* 130* 129* 131* 134*  K 3.6 3.4* 3.4* 2.9* 3.2*  CL 98* 100* 99* 100* 102  CO2 26 23 23 24 25   GLUCOSE 90 92 81 93 110*  BUN 10 <5* 5* 5* <5*  CREATININE 0.57* 0.57* 0.53* 0.59* 0.55*  CALCIUM 8.2* 7.8* 7.5* 7.4* 7.9*  MG 1.9 1.6* 1.6* 2.1 1.9  PHOS 3.1 3.2 2.5 2.8 3.0   GFR: Estimated Creatinine Clearance: 88.3 mL/min (by C-G formula based on SCr of 0.55 mg/dL (L)). Liver Function Tests: No results for input(s): AST, ALT, ALKPHOS, BILITOT, PROT, ALBUMIN in the last 168 hours. No results for input(s): LIPASE, AMYLASE in the last 168 hours. No results for input(s): AMMONIA in the last 168 hours. Coagulation Profile: No results for input(s): INR, PROTIME in the last 168 hours. Cardiac Enzymes: No results for input(s): CKTOTAL, CKMB, CKMBINDEX, TROPONINI in the last 168 hours. BNP (last 3 results) No results for input(s): PROBNP in the last 8760 hours. HbA1C: No results for input(s): HGBA1C in the last 72 hours. CBG: No results for input(s): GLUCAP in the last 168 hours. Lipid Profile: No results for input(s): CHOL, HDL, LDLCALC, TRIG, CHOLHDL, LDLDIRECT in the last 72 hours. Thyroid Function Tests: No results for input(s): TSH, T4TOTAL, FREET4, T3FREE, THYROIDAB in the last 72 hours. Anemia Panel: No results for input(s): VITAMINB12, FOLATE, FERRITIN, TIBC, IRON, RETICCTPCT in the last 72 hours. Sepsis Labs: No results for input(s): PROCALCITON, LATICACIDVEN in the last 168 hours.  Recent Results (from the past 240 hour(s))  Urine culture     Status: None   Collection Time: 02/25/16  1:43 PM  Result Value Ref Range Status   Specimen Description URINE, RANDOM  Final   Special Requests NONE  Final   Culture NO GROWTH  Final   Report Status 02/26/2016 FINAL  Final         Radiology Studies: No results found.      Scheduled Meds: . sodium chloride   Intravenous Once  .  amoxicillin-clavulanate  1 tablet Oral Q12H  . diphenoxylate-atropine  1 tablet Oral QID  . feeding supplement (ENSURE ENLIVE)  237 mL Oral BID BM  . ferrous sulfate  325 mg Oral TID WC  . loperamide  8 mg Oral QID  . loratadine  10 mg Oral Daily  . magnesium oxide  400 mg Oral TID  . oxymorphone  40 mg Oral TID  . pantoprazole  40 mg Oral BID AC  . potassium & sodium phosphates  1 packet Oral TID WC & HS  . potassium chloride  40 mEq Oral TID PC  . psyllium  1 packet Oral TID   Continuous Infusions: . dextrose 5 % and 0.9% NaCl 125 mL/hr at 02/27/16 1217  LOS: 19 days        Bunny Lowdermilk Gerome Apley, MD [facility] Pager [phone]  If 7PM-7AM, please contact night-coverage [email or web address] Password [facility] 02/27/2016, 5:54 PM

## 2016-02-27 NOTE — Progress Notes (Signed)
Physical Therapy Treatment Patient Details Name: [name] MRN: [id number] DOB: [birth year] Today's Date: 02/27/2016    History of Present Illness Patient is a 50 y/o male admitted with septic shock and exp lap on 10/25 with ileocectomy, 10/27 ileostomy, VDRF 10/25-10/30. Pt with recent admission for small bowel perf on 10/13 with resection and D/C 10/19. PMHx asthma , back sx    PT Comments    The pt has refused treatment several times but was will to participate today.  He tolerated ambulating 150 ft and some therapeutic exercise while seated on the EOB.  Pt is limited by pain in his abdomen and lower back, as well as by decreased ROM and strength of LLE.  Pt will benefit from further PT to increase his balance, strength, and endurance.  Continue with POC and emphasize the importance and safety of using a RW during next session.  Follow Up Recommendations  Home health PT;Supervision for mobility/OOB     Equipment Recommendations  Rolling walker with 5" wheels;3in1 (PT)    Recommendations for Other Services       Precautions / Restrictions Precautions Precautions: Fall Precaution Comments: ostomy Restrictions Weight Bearing Restrictions: No    Mobility  Bed Mobility Overal bed mobility: Needs Assistance Bed Mobility: Supine to Sit;Sit to Supine     Supine to sit: Supervision Sit to supine: Supervision   General bed mobility comments: Pt impulsive and VCs needed for safety  Transfers Overall transfer level: Needs assistance Equipment used: None Transfers: Sit to/from Stand Sit to Stand: Supervision         General transfer comment: Supervision for safety.  Pt refused to use the RW.  Ambulation/Gait Ambulation/Gait assistance: Supervision Ambulation Distance (Feet): 150 Feet Assistive device:  (Pt held onto IV pole with two hands.) Gait Pattern/deviations: Step-through pattern;Decreased step length - right;Decreased step length - left;Decreased stride  length;Decreased dorsiflexion - left;Shuffle;Trunk flexed Gait velocity: decreased Gait velocity interpretation: Below normal speed for age/gender General Gait Details: Difficulty advancing LLE without increased hip/knee flexion.  Pts step length was hindered by the IV pole being in the way.  Difficult to assess gait due to the IV pole but pt refused to use walker or allow PTA to push pole.   Stairs            Wheelchair Mobility    Modified Rankin (Stroke Patients Only)       Balance     Sitting balance-Leahy Scale: Good       Standing balance-Leahy Scale: Fair                      Cognition Arousal/Alertness: Awake/alert Behavior During Therapy: WFL for tasks assessed/performed Overall Cognitive Status: Within Functional Limits for tasks assessed                      Exercises Total Joint Exercises Marching in Standing: AROM;Both;10 reps;Seated General Exercises - Lower Extremity Toe Raises: AROM;Both;10 reps;Seated    General Comments        Pertinent Vitals/Pain Pain Assessment: Faces Faces Pain Scale: Hurts little more Pain Location: back and abdomen Pain Descriptors / Indicators: Grimacing;Sore;Guarding Pain Intervention(s): Limited activity within patient's tolerance;Monitored during session    Home Living                      Prior Function            PT Goals (current goals can now be found  in the care plan section) Acute Rehab PT Goals Patient Stated Goal: return home PT Goal Formulation: With patient Time For Goal Achievement: 02/29/16 Potential to Achieve Goals: Good Progress towards PT goals: Progressing toward goals    Frequency    Min 3X/week      PT Plan Current plan remains appropriate    Co-evaluation             End of Session Equipment Utilized During Treatment:  (Pt refused gait belt.) Activity Tolerance: Patient tolerated treatment well Patient left: in bed;with call bell/phone within  reach     Time: 1634-1650 PT Time Calculation (min) (ACUTE ONLY): 16 min  Charges:  $Gait Training: 8-22 mins                    G Codes:      Bary Castilla 2016/03/22, 5:17 PM Rito Ehrlich. [place], [place]

## 2016-02-27 NOTE — Progress Notes (Signed)
Patient ID: [name], male   DOB: 02-09-1966, 50 y.o.   MRN: [id number]  [facility] Surgery Progress Note  15 Days Post-Op  Subjective: Abdomen less tender today. Tolerating diet. Still with high ielostomy output, but patient very pleased that he has been able to keep the same ostomy bag on since Friday without leak.  Objective: Vital signs in last 24 hours: Temp:  [98.7 F (37.1 C)-99.5 F (37.5 C)] 99.5 F (37.5 C) (11/13 0505) Pulse Rate:  [79-80] 80 (11/13 0505) Resp:  [16-17] 16 (11/13 0505) BP: (101-108)/(51-54) 101/51 (11/13 0505) SpO2:  [99 %-100 %] 100 % (11/13 0505) Last BM Date: 02/26/16  Intake/Output from previous day: 11/12 0701 - 11/13 0700 In: 2183.3 [I.V.:2133.3; IV Piggyback:50] Out: [id number] [Urine:2350; Stool:4080] Intake/Output this shift: Total I/O In: -  Out: 350 [Urine:200; Stool:150]  PE: Gen: Alert, NAD, pleasant Card: RRR Pulm: CTAB Abd: Soft, NT/ND, +BS, midline incision clean and dry with only a small area at distal and proximal aspects minimally open and trace drainage from distal aspect, ileostomy functioning with liquid brown stool in bag, skin around stoma healing well  Lab Results:   Recent Labs  02/26/16 0500 02/27/16 0440  WBC 14.5* 10.0  HGB 7.1* 7.7*  HCT 21.0* 23.5*  PLT 280 355   BMET  Recent Labs  02/26/16 0500 02/27/16 0440  NA 131* 134*  K 2.9* 3.2*  CL 100* 102  CO2 24 25  GLUCOSE 93 110*  BUN 5* <5*  CREATININE 0.59* 0.55*  CALCIUM 7.4* 7.9*   PT/INR No results for input(s): LABPROT, INR in the last 72 hours. CMP     Component Value Date/Time   NA 134 (L) 02/27/2016 0440   K 3.2 (L) 02/27/2016 0440   CL 102 02/27/2016 0440   CO2 25 02/27/2016 0440   GLUCOSE 110 (H) 02/27/2016 0440   BUN <5 (L) 02/27/2016 0440   CREATININE 0.55 (L) 02/27/2016 0440   CALCIUM 7.9 (L) 02/27/2016 0440   PROT 5.1 (L) 02/20/2016 0534   ALBUMIN 1.6 (L) 02/20/2016 0534   AST 21 02/20/2016 0534   ALT 18  02/20/2016 0534   ALKPHOS 127 (H) 02/20/2016 0534   BILITOT 0.6 02/20/2016 0534   GFRNONAA >60 02/27/2016 0440   GFRAA >60 02/27/2016 0440   Lipase     Component Value Date/Time   LIPASE 16 02/08/2016 0412       Studies/Results: Ct Abdomen Pelvis W Contrast  Result Date: 02/25/2016 CLINICAL DATA:  50 year old male inpatient with distal small bowel perforation status post emergent ileoectomy [id number], complicated by ileocolic anastomotic leak with extensive peritoneal cavity abscess formation status post exploratory laparotomy, drainage of the peritoneal abscesses, resection of ileocolonic anastomosis 02/08/2016, with resection of ischemic distal ileum and ileostomy creation 02/10/2016, with closure of abdominal wall 02/12/2016, now with worsening leukocytosis. EXAM: CT ABDOMEN AND PELVIS WITH CONTRAST TECHNIQUE: Multidetector CT imaging of the abdomen and pelvis was performed using the standard protocol following bolus administration of intravenous contrast. CONTRAST:  13mL ISOVUE-300 IOPAMIDOL (ISOVUE-300) INJECTION 61% COMPARISON:  02/08/2016 CT abdomen/ pelvis. FINDINGS: Lower chest: Small left pleural effusion. No right pleural effusion. Trace pericardial effusion/ thickening, slightly increased. Patchy bandlike consolidation in the peripheral basilar left lower lobe with associated volume loss, mildly increased. Small subpleural bleb in the basilar left lower lobe appears smaller. Hepatobiliary: Normal liver size. Hypodense 2.1 x 2.1 cm segment 3 left liver lobe lesion (series 201/ image 24), previously 1.8 x 1.7 cm, mildly increased. At least  four scattered subcapsular poorly marginated low-attenuation liver lesions, largest 4.5 x 2.6 cm at the left liver dome (series 201/ image 12), not appreciably changed. Subcentimeter right liver lobe hypodense lesion is too small to characterize and stable. No new liver lesions. Normal gallbladder with no radiopaque cholelithiasis. No biliary ductal  dilatation. Pancreas: Normal, with no mass or duct dilation. Spleen: Normal size spleen. New subcapsular 3.8 x 2.3 x 4.5 cm collection in the peripheral upper spleen (series 201/image 10). Adrenals/Urinary Tract: Normal adrenals. No hydronephrosis. Stable mild scarring in the posterior upper right kidney versus developmental junctional cortical defect. No renal masses. Normal bladder. Stomach/Bowel: Grossly normal stomach. Patient is status post ileocecal and distal small bowel resection with ventral right abdominal wall end ileostomy. Remnant small-bowel loops are top-normal in caliber. Oral contrast progresses to the ileostomy bag. There is mild diffuse small bowel wall thickening without appreciable pneumatosis. The remnant proximally blind-ending large-bowel is collapsed with diffuse large bowel wall thickening and no appreciable pneumatosis. Vascular/Lymphatic: Normal caliber abdominal aorta. Patent portal, splenic, hepatic and renal veins. Stable mild aortocaval, gastrohepatic ligament, paraceliac and left para-aortic retroperitoneal lymphadenopathy, measuring up to the 1.3 cm in the gastrohepatic ligament (series 201/ image 15), 1.1 cm in the aortocaval chain (series 201/ image 27) and 1.3 cm in the left para-aortic chain (series 201/ image 29). Stable mild right mesenteric adenopathy measuring up to 1.1 cm (series 201/ image 33). No new pathologically enlarged abdominopelvic nodes. Reproductive: Normal size prostate. Other: No pneumoperitoneum. Persistent fat stranding and ill-defined fluid throughout the peritoneal cavity. Small 2.8 x 1.9 x 2.3 cm thick walled fluid collection in the posterior right lower peritoneal cavity abutting the right psoas muscle laterally (series 201/ image 47). Small 2.3 x 1.6 x 1.6 cm thick walled fluid collection in the central pelvis between the sigmoid colon and pelvic small bowel (series 201/ image 65). Midline laparotomy. No superficial fluid collections. Musculoskeletal: No  aggressive appearing focal osseous lesions. Moderate lumbar spondylosis. Stable postsurgical changes from anterior spinal fusion at L3-4 and L4-5. Partially visualized spinal fusion hardware in the left lower thoracic spine. IMPRESSION: 1. Persistent diffuse peritonitis. Two small peritoneal abscesses as described in the posterior right lower quadrant and central pelvis. 2. New small low attenuation subcapsular collection in the peripheral upper spleen, possibly a subcapsular splenic abscess or infarct. 3. Mild growth of a 2.1 cm low-attenuation segment 3 left liver lobe mass, which could represent an hepatic abscess. 4. Additional low-attenuation liver lesions are stable and indeterminate and warrant outpatient evaluation with MRI abdomen without and with IV contrast when feasible. 5. No evidence of small bowel obstruction. Diffuse small and large bowel wall thickening probably due to peritonitis, with no specific findings of bowel ischemia (no pneumoperitoneum and no appreciable pneumatosis). 6. Small left pleural effusion. Patchy bandlike consolidation and volume loss in the peripheral basilar left lower lobe, favor atelectasis. 7. Trace pericardial effusion/thickening, slightly increased. Electronically Signed   By: Ilona Sorrel M.D.   On: 02/25/2016 16:17    Anti-infectives: Anti-infectives    Start     Dose/Rate Route Frequency Ordered Stop   02/26/16 1400  piperacillin-tazobactam (ZOSYN) IVPB 3.375 g     3.375 g 12.5 mL/hr over 240 Minutes Intravenous Every 8 hours 02/26/16 1059     02/26/16 1045  metroNIDAZOLE (FLAGYL) IVPB 500 mg  Status:  Discontinued     500 mg 100 mL/hr over 60 Minutes Intravenous Every 8 hours 02/26/16 1038 02/26/16 1100   02/18/16 1400  tobramycin (NEBCIN)  490 mg in dextrose 5 % 100 mL IVPB     7 mg/kg  70.7 kg (Ideal) 112.3 mL/hr over 60 Minutes Intravenous Every 24 hours 02/18/16 1041 02/20/16 1453   02/15/16 0031  tigecycline (TYGACIL) 50 mg in sodium chloride 0.9 %  100 mL IVPB     50 mg 200 mL/hr over 30 Minutes Intravenous Every 12 hours 02/14/16 1232 02/20/16 1325   02/14/16 1400  tobramycin (NEBCIN) 510 mg in dextrose 5 % 100 mL IVPB  Status:  Discontinued     7 mg/kg  72.9 kg 112.8 mL/hr over 60 Minutes Intravenous Every 24 hours 02/14/16 1241 02/18/16 1041   02/14/16 1245  tigecycline (TYGACIL) 100 mg in sodium chloride 0.9 % 100 mL IVPB     100 mg 200 mL/hr over 30 Minutes Intravenous  Once 02/14/16 1232 02/14/16 1342   02/13/16 1200  cefTRIAXone (ROCEPHIN) 2 g in dextrose 5 % 50 mL IVPB  Status:  Discontinued     2 g 100 mL/hr over 30 Minutes Intravenous Every 24 hours 02/13/16 0857 02/14/16 1205   02/13/16 1200  tobramycin (NEBCIN) 530 mg in dextrose 5 % 100 mL IVPB  Status:  Discontinued     7 mg/kg  75.5 kg (Adjusted) 113.3 mL/hr over 60 Minutes Intravenous Every 24 hours 02/13/16 1123 02/14/16 1234   02/10/16 1500  anidulafungin (ERAXIS) 100 mg in sodium chloride 0.9 % 100 mL IVPB     100 mg over 90 Minutes Intravenous Every 24 hours 02/09/16 0804 02/15/16 1556   02/10/16 1500  vancomycin (VANCOCIN) IVPB 1000 mg/200 mL premix  Status:  Discontinued     1,000 mg 200 mL/hr over 60 Minutes Intravenous Every 8 hours 02/10/16 1239 02/13/16 0856   02/10/16 1500  imipenem-cilastatin (PRIMAXIN) 1,000 mg in sodium chloride 0.9 % 250 mL IVPB  Status:  Discontinued     1,000 mg 250 mL/hr over 60 Minutes Intravenous Every 8 hours 02/10/16 1239 02/13/16 0856   02/09/16 1000  imipenem-cilastatin (PRIMAXIN) 1,000 mg in sodium chloride 0.9 % 250 mL IVPB  Status:  Discontinued     1,000 mg 250 mL/hr over 60 Minutes Intravenous Every 8 hours 02/09/16 0812 02/10/16 1239   02/09/16 1000  vancomycin (VANCOCIN) IVPB 1000 mg/200 mL premix  Status:  Discontinued     1,000 mg 200 mL/hr over 60 Minutes Intravenous Every 8 hours 02/09/16 0812 02/10/16 1239   02/09/16 0900  anidulafungin (ERAXIS) 200 mg in sodium chloride 0.9 % 200 mL IVPB     200 mg over 180  Minutes Intravenous  Once 02/09/16 0804 02/09/16 1209   02/09/16 0830  anidulafungin (ERAXIS) 100 mg in sodium chloride 0.9 % 100 mL IVPB  Status:  Discontinued     100 mg over 90 Minutes Intravenous Every 24 hours 02/08/16 0811 02/09/16 0804   02/08/16 1800  vancomycin (VANCOCIN) IVPB 750 mg/150 ml premix  Status:  Discontinued     750 mg 150 mL/hr over 60 Minutes Intravenous Every 8 hours 02/08/16 0847 02/09/16 0812   02/08/16 1400  piperacillin-tazobactam (ZOSYN) IVPB 3.375 g  Status:  Discontinued     3.375 g 12.5 mL/hr over 240 Minutes Intravenous Every 8 hours 02/08/16 0432 02/08/16 0802   02/08/16 0900  imipenem-cilastatin (PRIMAXIN) 500 mg in sodium chloride 0.9 % 100 mL IVPB  Status:  Discontinued     500 mg 200 mL/hr over 30 Minutes Intravenous Every 6 hours 02/08/16 0818 02/09/16 0812   02/08/16 0830  ertapenem (INVANZ) 1  g in sodium chloride 0.9 % 50 mL IVPB  Status:  Discontinued     1 g 100 mL/hr over 30 Minutes Intravenous Every 24 hours 02/08/16 0756 02/08/16 0812   02/08/16 0830  anidulafungin (ERAXIS) 200 mg in sodium chloride 0.9 % 200 mL IVPB  Status:  Discontinued     200 mg over 180 Minutes Intravenous  Once 02/08/16 0811 02/09/16 1457   02/08/16 0830  vancomycin (VANCOCIN) 1,500 mg in sodium chloride 0.9 % 500 mL IVPB  Status:  Discontinued     1,500 mg 250 mL/hr over 120 Minutes Intravenous  Once 02/08/16 0815 02/09/16 1355   02/08/16 0815  vancomycin (VANCOCIN) 1,500 mg in sodium chloride 0.9 % 500 mL IVPB  Status:  Discontinued     1,500 mg 250 mL/hr over 120 Minutes Intravenous Every 12 hours 02/08/16 0811 02/08/16 0812   02/08/16 0430  piperacillin-tazobactam (ZOSYN) IVPB 3.375 g     3.375 g 100 mL/hr over 30 Minutes Intravenous  Once 02/08/16 0419 02/08/16 0532       Assessment/Plan Crohns disease s/p ileocecectomy 01/27/16 for SB perforation Anastomotic leak/ peritoneal abscess - s/p exp lap/ resection of anastomosis 02/08/16 Washout/ VAC change  02/10/16 Washout/ fascial closure 02/12/16 - Ileostomy with increased output 3280cc/24hr. Continue immodium 8mg  QID and metamucil, has not yet received lomotil (wil make this a scheduled drug rather than PRN) - CT 11/11 showed multiple small fluid collections, started zosyn/flagyl - WBC WNL, low grade fevers TMAX99.5  Acute blood loss anemia- stable, Hg 7.7today Leukocytosis - WBC trending down 10.0 Phos/Mag normal this AM Slight hyponatremia (134) and hypokalemia (3.2)today >> replace and continue to monitor Chronic pain syndrome  ID - zosyn 11/12>> VTE - SCDs FEN - dysphagia 2 diet, Ensure  Plan: replace and continue to monitor K and Na. Ileostomy output increased from yesterday, will schedule lomotil in addition to imodium and metamucil he is already taking. Plan to go home once output <1L.Ostomy pouch with no leaking over the weekend. Wound healing, continue wet to dry dressings to midline wound. Continue PT/mobilization. Will discuss with MD how long patient will require abx therapy  (day 2 zosyn).   LOS: 19 days    Jerrye Beavers , [facility] Surgery 02/27/2016, 10:22 AM Pager: [phone] Consults: [phone] Mon-Fri 7:00 am-4:30 pm Sat-Sun 7:00 am-11:30 am

## 2016-02-28 LAB — MAGNESIUM: MAGNESIUM: 1.6 mg/dL — AB (ref 1.7–2.4)

## 2016-02-28 LAB — BASIC METABOLIC PANEL
Anion gap: 7 (ref 5–15)
CALCIUM: 7.7 mg/dL — AB (ref 8.9–10.3)
CO2: 24 mmol/L (ref 22–32)
CREATININE: 0.51 mg/dL — AB (ref 0.61–1.24)
Chloride: 104 mmol/L (ref 101–111)
GFR calc Af Amer: >60 mL/min (ref >60)
Glucose, Bld: 118 mg/dL — ABNORMAL HIGH (ref 65–99)
POTASSIUM: 2.8 mmol/L — AB (ref 3.5–5.1)
SODIUM: 135 mmol/L (ref 135–145)

## 2016-02-28 LAB — PHOSPHORUS: PHOSPHORUS: 3 mg/dL (ref 2.5–4.6)

## 2016-02-28 MED ORDER — MAGNESIUM SULFATE IN D5W 1-5 GM/100ML-% IV SOLN
1.0000 g | Freq: Once | INTRAVENOUS | Status: AC
  Administered 2016-02-28: 1 g via INTRAVENOUS
  Filled 2016-02-28 (×2): qty 100

## 2016-02-28 NOTE — Progress Notes (Signed)
Patient ID: [name], male   DOB: 11/01/1965, 50 y.o.   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  Still with low K and decreased appetite.  Switched to augmentin yesterday.    Objective: Vital signs in last 24 hours: Temp:  [97.8 F (36.6 C)-98.5 F (36.9 C)] 98.2 F (36.8 C) (11/14 0620) Pulse Rate:  [80-87] 82 (11/14 0620) Resp:  [18-20] 20 (11/14 0620) BP: (110-116)/(59-73) 116/59 (11/14 0620) SpO2:  [98 %-99 %] 99 % (11/14 0620) Last BM Date: 02/28/16  Intake/Output from previous day: 11/13 0701 - 11/14 0700 In: 240 [P.O.:240] Out: 2975 [Urine:1675; Stool:1300] Intake/Output this shift: Total I/O In: -  Out: 685 [Urine:510; Stool:175]  PE: Gen: Alert, NAD, pleasant Card: RRR Pulm: CTAB Abd: Soft, NT/ND, +BS, midline wound c/d/i, ileostomy functioning with liquid brown stool in bag, skin around stoma healing well  Lab Results:   Recent Labs  02/26/16 0500 02/27/16 0440  WBC 14.5* 10.0  HGB 7.1* 7.7*  HCT 21.0* 23.5*  PLT 280 355   BMET  Recent Labs  02/27/16 0440 02/28/16 0500  NA 134* 135  K 3.2* 2.8*  CL 102 104  CO2 25 24  GLUCOSE 110* 118*  BUN <5* <5*  CREATININE 0.55* 0.51*  CALCIUM 7.9* 7.7*   PT/INR No results for input(s): LABPROT, INR in the last 72 hours. CMP     Component Value Date/Time   NA 135 02/28/2016 0500   K 2.8 (L) 02/28/2016 0500   CL 104 02/28/2016 0500   CO2 24 02/28/2016 0500   GLUCOSE 118 (H) 02/28/2016 0500   BUN <5 (L) 02/28/2016 0500   CREATININE 0.51 (L) 02/28/2016 0500   CALCIUM 7.7 (L) 02/28/2016 0500   PROT 5.1 (L) 02/20/2016 0534   ALBUMIN 1.6 (L) 02/20/2016 0534   AST 21 02/20/2016 0534   ALT 18 02/20/2016 0534   ALKPHOS 127 (H) 02/20/2016 0534   BILITOT 0.6 02/20/2016 0534   GFRNONAA >60 02/28/2016 0500   GFRAA >60 02/28/2016 0500   Lipase     Component Value Date/Time   LIPASE 16 02/08/2016 0412        Studies/Results: No results found.  Anti-infectives: Anti-infectives    Start     Dose/Rate Route Frequency Ordered Stop   02/27/16 1345  amoxicillin-clavulanate (AUGMENTIN) 875-125 MG per tablet 1 tablet     1 tablet Oral Every 12 hours 02/27/16 1337 03/05/16 0959   02/26/16 1400  piperacillin-tazobactam (ZOSYN) IVPB 3.375 g  Status:  Discontinued     3.375 g 12.5 mL/hr over 240 Minutes Intravenous Every 8 hours 02/26/16 1059 02/27/16 1337   02/26/16 1045  metroNIDAZOLE (FLAGYL) IVPB 500 mg  Status:  Discontinued     500 mg 100 mL/hr over 60 Minutes Intravenous Every 8 hours 02/26/16 1038 02/26/16 1100   02/18/16 1400  tobramycin (NEBCIN) 490 mg in dextrose 5 % 100 mL IVPB     7 mg/kg  70.7 kg (Ideal) 112.3 mL/hr over 60 Minutes Intravenous Every 24 hours 02/18/16 1041 02/20/16 1453   02/15/16 0031  tigecycline (TYGACIL) 50 mg in sodium chloride 0.9 % 100 mL IVPB     50 mg 200 mL/hr over 30 Minutes Intravenous Every 12 hours 02/14/16 1232 02/20/16 1325   02/14/16 1400  tobramycin (NEBCIN) 510 mg in dextrose 5 % 100 mL IVPB  Status:  Discontinued     7 mg/kg  72.9 kg 112.8  mL/hr over 60 Minutes Intravenous Every 24 hours 02/14/16 1241 02/18/16 1041   02/14/16 1245  tigecycline (TYGACIL) 100 mg in sodium chloride 0.9 % 100 mL IVPB     100 mg 200 mL/hr over 30 Minutes Intravenous  Once 02/14/16 1232 02/14/16 1342   02/13/16 1200  cefTRIAXone (ROCEPHIN) 2 g in dextrose 5 % 50 mL IVPB  Status:  Discontinued     2 g 100 mL/hr over 30 Minutes Intravenous Every 24 hours 02/13/16 0857 02/14/16 1205   02/13/16 1200  tobramycin (NEBCIN) 530 mg in dextrose 5 % 100 mL IVPB  Status:  Discontinued     7 mg/kg  75.5 kg (Adjusted) 113.3 mL/hr over 60 Minutes Intravenous Every 24 hours 02/13/16 1123 02/14/16 1234   02/10/16 1500  anidulafungin (ERAXIS) 100 mg in sodium chloride 0.9 % 100 mL IVPB     100 mg over 90 Minutes Intravenous Every 24 hours 02/09/16 0804 02/15/16 1556    02/10/16 1500  vancomycin (VANCOCIN) IVPB 1000 mg/200 mL premix  Status:  Discontinued     1,000 mg 200 mL/hr over 60 Minutes Intravenous Every 8 hours 02/10/16 1239 02/13/16 0856   02/10/16 1500  imipenem-cilastatin (PRIMAXIN) 1,000 mg in sodium chloride 0.9 % 250 mL IVPB  Status:  Discontinued     1,000 mg 250 mL/hr over 60 Minutes Intravenous Every 8 hours 02/10/16 1239 02/13/16 0856   02/09/16 1000  imipenem-cilastatin (PRIMAXIN) 1,000 mg in sodium chloride 0.9 % 250 mL IVPB  Status:  Discontinued     1,000 mg 250 mL/hr over 60 Minutes Intravenous Every 8 hours 02/09/16 0812 02/10/16 1239   02/09/16 1000  vancomycin (VANCOCIN) IVPB 1000 mg/200 mL premix  Status:  Discontinued     1,000 mg 200 mL/hr over 60 Minutes Intravenous Every 8 hours 02/09/16 0812 02/10/16 1239   02/09/16 0900  anidulafungin (ERAXIS) 200 mg in sodium chloride 0.9 % 200 mL IVPB     200 mg over 180 Minutes Intravenous  Once 02/09/16 0804 02/09/16 1209   02/09/16 0830  anidulafungin (ERAXIS) 100 mg in sodium chloride 0.9 % 100 mL IVPB  Status:  Discontinued     100 mg over 90 Minutes Intravenous Every 24 hours 02/08/16 0811 02/09/16 0804   02/08/16 1800  vancomycin (VANCOCIN) IVPB 750 mg/150 ml premix  Status:  Discontinued     750 mg 150 mL/hr over 60 Minutes Intravenous Every 8 hours 02/08/16 0847 02/09/16 0812   02/08/16 1400  piperacillin-tazobactam (ZOSYN) IVPB 3.375 g  Status:  Discontinued     3.375 g 12.5 mL/hr over 240 Minutes Intravenous Every 8 hours 02/08/16 0432 02/08/16 0802   02/08/16 0900  imipenem-cilastatin (PRIMAXIN) 500 mg in sodium chloride 0.9 % 100 mL IVPB  Status:  Discontinued     500 mg 200 mL/hr over 30 Minutes Intravenous Every 6 hours 02/08/16 0818 02/09/16 0812   02/08/16 0830  ertapenem (INVANZ) 1 g in sodium chloride 0.9 % 50 mL IVPB  Status:  Discontinued     1 g 100 mL/hr over 30 Minutes Intravenous Every 24 hours 02/08/16 0756 02/08/16 0812   02/08/16 0830  anidulafungin  (ERAXIS) 200 mg in sodium chloride 0.9 % 200 mL IVPB  Status:  Discontinued     200 mg over 180 Minutes Intravenous  Once 02/08/16 0811 02/09/16 1457   02/08/16 0830  vancomycin (VANCOCIN) 1,500 mg in sodium chloride 0.9 % 500 mL IVPB  Status:  Discontinued     1,500 mg 250 mL/hr  over 120 Minutes Intravenous  Once 02/08/16 0815 02/09/16 1355   02/08/16 0815  vancomycin (VANCOCIN) 1,500 mg in sodium chloride 0.9 % 500 mL IVPB  Status:  Discontinued     1,500 mg 250 mL/hr over 120 Minutes Intravenous Every 12 hours 02/08/16 0811 02/08/16 0812   02/08/16 0430  piperacillin-tazobactam (ZOSYN) IVPB 3.375 g     3.375 g 100 mL/hr over 30 Minutes Intravenous  Once 02/08/16 0419 02/08/16 0532       Assessment/Plan Crohns disease s/p ileocecectomy 01/27/16 for SB perforation Anastomotic leak/ peritoneal abscess - s/p exp lap/ resection of anastomosis 02/08/16 Washout/ VAC change 02/10/16 Washout/ fascial closure 02/12/16 - Ileostomy with increased output 1300 cc/24hr. Continue immodium 8mg  QID and metamucil, scheduled lomotil. - CT 11/11 showed multiple small fluid collections, started zosyn/flagyl  Chronic pain syndrome  ID - S/P Zosyn 12/12.  Now on Augmentin. VTE - SCDs FEN - dysphagia 2 diet, Ensure  Plan: Much improved.   Plan to go home once output <1L with reasonable consistency. Wound healing, continue wet to dry dressings to midline wound. Continue PT/mobilization.    LOS: 20 days    [facility] , [facility] Surgery 02/28/2016, 9:32 AM

## 2016-02-28 NOTE — Progress Notes (Signed)
Arrien, MD notified about potassium 2.8, and Magnesium 1.6.

## 2016-02-28 NOTE — Progress Notes (Signed)
PROGRESS NOTE    [name]  [id number] DOB: Jan 04, 1966 DOA: 02/08/2016 PCP: No primary care provider on file.    Brief Narrative:  50 yo male with small bowel perforation, s/p ileocectomy 10/17, presents with abdominal pain. Found to have ischemic bowel and an abscess. Post abscess drainage, developed significant output from colostomy. Worsening leukocytosis, follow on ct abdomen and pelvis, showed multiple abdominal abscessed, resumed on antibiotic therapy   Assessment & Plan:   Active Problems:   Abdominal infection ([place])   Peritonitis ([place])   Sepsis ([place])   Sepsis due to Escherichia coli [facility])   Ischemic bowel disease ([place])   Bowel perforation ([place])   Abdominal abscess ([place])   Pleural effusion   Acute kidney injury ([place])   1. High output ileostomybag. Continue high output form ileostomy bag, over last 24 hours at reached 1300 ml. Target output less 1000 ml.  Will continue IV fluids at rate of 125 cc/hr, no signs of pulmonary edema on exam. Overnight 2735 cc negative fluid balance. Will continue to replete electrolytes.   2. Chronic pain syndrome. On as needed hydromorphone and opana. No signs of sedation. Pain well controlled.   3. Hyponatremia, Hypokalemia and Hypomagnesemia. High fluid loss per ileostomy and high urine output, will continue saline at 125 cc.hr, continue k repletion with kcl, and mag with mag oxide. Extra dose of mag today.   4. Ironanemia. Hb stable at 7,7.  Tolerating well iron supplements. No signs of bleeding.   5. New onset of intra-abdominal abscess, multiple. Leukocytosis improving down to 10.0. Cntinue antibiotic therapy with augmentin, per surgery recommendations.    DVT prophylaxis:scd  Code Status:full  Family Communication:I spoke with family at the bedside and all questions were addressed.  Disposition Plan:home    Consultants:  Surgery   Procedures: Exploratory laparotomy with removal of vacuum pack  dressing and closure of abdominal wall 02/12/2016   Antimicrobials:   Zosyn #1 (total 1)  Augmentin #0   Subjective: Positive right side pain, moderate in intensity, dull in nature, no radiation, worse to touch. Improved with pain medications. No nausea or vomiting, continue high output ostomy bag.   Objective: Vitals:   02/27/16 1145 02/27/16 2125 02/28/16 0620 02/28/16 1028  BP: 112/73 110/70 (!) 116/59 (!) 120/57  Pulse: 87 80 82 81  Resp:  18 20 18   Temp: 98.5 F (36.9 C) 97.8 F (36.6 C) 98.2 F (36.8 C) 98.3 F (36.8 C)  TempSrc: Oral Oral Oral Oral  SpO2: 98% 99% 99% 98%  Weight:      Height:        Intake/Output Summary (Last 24 hours) at 02/28/16 1304 Last data filed at 02/28/16 1200  Gross per 24 hour  Intake              240 ml  Output             3635 ml  Net            -3395 ml   Filed Weights   02/13/16 0350 02/14/16 0400 02/16/16 0400  Weight: 82.6 kg (182 lb 1.6 oz) 72.9 kg (160 lb 11.5 oz) 55.9 kg (123 lb 3.8 oz)    Examination:  General exam: Not in pain or dyspnea E ENT: mild pallor, but no icterus, oral mucosa moist Respiratory system: Clear to auscultation. Respiratory effort normal. Cardiovascular system: S1 & S2 heard, RRR. No JVD, murmurs, rubs, gallops or clicks. No pedal edema. Gastrointestinal system: Abdomen is nondistended, mild  tender to superficial palpation ostomy bag in place with large amounts of green watery fluid. Dressing in place at the surgical wound. No organomegaly or masses felt. Normal bowel sounds heard. Central nervous system: Alert and oriented. No focal neurological deficits. Extremities: Symmetric 5 x 5 power. Skin: No rashes, lesions or ulcers   Data Reviewed: I have personally reviewed following labs and imaging studies  CBC:  Recent Labs Lab 02/23/16 0425 02/24/16 0427 02/25/16 0450 02/26/16 0500 02/27/16 0440  WBC 11.9* 25.3* 25.6* 14.5* 10.0  NEUTROABS  --   --   --  10.2* 7.1  HGB 9.2* 9.7* 8.6*  7.1* 7.7*  HCT 27.5* 29.2* 26.3* 21.0* 23.5*  MCV 82.1 83.0 83.5 83.3 83.6  PLT 471* 412* 329 280 [id number]   Basic Metabolic Panel:  Recent Labs Lab 02/24/16 0427 02/25/16 0410 02/26/16 0500 02/27/16 0440 02/28/16 0500  NA 130* 129* 131* 134* 135  K 3.4* 3.4* 2.9* 3.2* 2.8*  CL 100* 99* 100* 102 104  CO2 23 23 24 25 24   GLUCOSE 92 81 93 110* 118*  BUN <5* 5* 5* <5* <5*  CREATININE 0.57* 0.53* 0.59* 0.55* 0.51*  CALCIUM 7.8* 7.5* 7.4* 7.9* 7.7*  MG 1.6* 1.6* 2.1 1.9 1.6*  PHOS 3.2 2.5 2.8 3.0 3.0   GFR: Estimated Creatinine Clearance: 88.3 mL/min (by C-G formula based on SCr of 0.51 mg/dL (L)). Liver Function Tests: No results for input(s): AST, ALT, ALKPHOS, BILITOT, PROT, ALBUMIN in the last 168 hours. No results for input(s): LIPASE, AMYLASE in the last 168 hours. No results for input(s): AMMONIA in the last 168 hours. Coagulation Profile: No results for input(s): INR, PROTIME in the last 168 hours. Cardiac Enzymes: No results for input(s): CKTOTAL, CKMB, CKMBINDEX, TROPONINI in the last 168 hours. BNP (last 3 results) No results for input(s): PROBNP in the last 8760 hours. HbA1C: No results for input(s): HGBA1C in the last 72 hours. CBG: No results for input(s): GLUCAP in the last 168 hours. Lipid Profile: No results for input(s): CHOL, HDL, LDLCALC, TRIG, CHOLHDL, LDLDIRECT in the last 72 hours. Thyroid Function Tests: No results for input(s): TSH, T4TOTAL, FREET4, T3FREE, THYROIDAB in the last 72 hours. Anemia Panel: No results for input(s): VITAMINB12, FOLATE, FERRITIN, TIBC, IRON, RETICCTPCT in the last 72 hours. Sepsis Labs: No results for input(s): PROCALCITON, LATICACIDVEN in the last 168 hours.  Recent Results (from the past 240 hour(s))  Urine culture     Status: None   Collection Time: 02/25/16  1:43 PM  Result Value Ref Range Status   Specimen Description URINE, RANDOM  Final   Special Requests NONE  Final   Culture NO GROWTH  Final   Report Status  02/26/2016 FINAL  Final         Radiology Studies: No results found.      Scheduled Meds: . sodium chloride   Intravenous Once  . amoxicillin-clavulanate  1 tablet Oral Q12H  . diphenoxylate-atropine  1 tablet Oral QID  . feeding supplement (ENSURE ENLIVE)  237 mL Oral BID BM  . ferrous sulfate  325 mg Oral TID WC  . loperamide  8 mg Oral QID  . loratadine  10 mg Oral Daily  . magnesium oxide  400 mg Oral TID  . oxymorphone  40 mg Oral TID  . pantoprazole  40 mg Oral Daily  . potassium & sodium phosphates  1 packet Oral TID WC & HS  . potassium chloride  40 mEq Oral TID PC  . psyllium  1 packet Oral TID   Continuous Infusions: . dextrose 5 % and 0.9% NaCl 125 mL/hr at 02/28/16 [id number]     LOS: 20 days      [name] Gerome Apley, MD [facility] Pager [phone]  If 7PM-7AM, please contact night-coverage [email or web address] Password [email or web address] 02/28/2016, 1:04 PM

## 2016-02-29 ENCOUNTER — Inpatient Hospital Stay (HOSPITAL_COMMUNITY): Payer: Worker's Compensation

## 2016-02-29 LAB — URINALYSIS, ROUTINE W REFLEX MICROSCOPIC
Bilirubin Urine: NEGATIVE
Glucose, UA: NEGATIVE mg/dL
Hgb urine dipstick: NEGATIVE
Ketones, ur: NEGATIVE mg/dL
LEUKOCYTES UA: NEGATIVE
NITRITE: NEGATIVE
PROTEIN: NEGATIVE mg/dL
SPECIFIC GRAVITY, URINE: 1.009 (ref 1.005–1.030)
pH: 6.5 (ref 5.0–8.0)

## 2016-02-29 LAB — CBC WITH DIFFERENTIAL/PLATELET
BASOS ABS: 0.1 10*3/uL (ref 0.0–0.1)
BASOS PCT: 1 %
EOS ABS: 0.4 10*3/uL (ref 0.0–0.7)
Eosinophils Relative: 4 %
HCT: 20.1 % — ABNORMAL LOW (ref 39.0–52.0)
HEMOGLOBIN: 6.5 g/dL — AB (ref 13.0–17.0)
LYMPHS ABS: 1.6 10*3/uL (ref 0.7–4.0)
Lymphocytes Relative: 20 %
MCH: 27.5 pg (ref 26.0–34.0)
MCHC: 32.3 g/dL (ref 30.0–36.0)
MCV: 85.2 fL (ref 78.0–100.0)
Monocytes Absolute: 1.3 10*3/uL — ABNORMAL HIGH (ref 0.1–1.0)
Monocytes Relative: 16 %
NEUTROS PCT: 59 %
Neutro Abs: 4.9 10*3/uL (ref 1.7–7.7)
PLATELETS: 213 10*3/uL (ref 150–400)
RBC: 2.36 MIL/uL — AB (ref 4.22–5.81)
RDW: 18 % — ABNORMAL HIGH (ref 11.5–15.5)
WBC: 8.2 10*3/uL (ref 4.0–10.5)

## 2016-02-29 LAB — BASIC METABOLIC PANEL
Anion gap: 6 (ref 5–15)
BUN: <5 mg/dL — ABNORMAL LOW (ref 6–20)
CHLORIDE: 109 mmol/L (ref 101–111)
CO2: 23 mmol/L (ref 22–32)
CREATININE: 0.45 mg/dL — AB (ref 0.61–1.24)
Calcium: 7.2 mg/dL — ABNORMAL LOW (ref 8.9–10.3)
Glucose, Bld: 344 mg/dL — ABNORMAL HIGH (ref 65–99)
Potassium: 3.3 mmol/L — ABNORMAL LOW (ref 3.5–5.1)
SODIUM: 138 mmol/L (ref 135–145)

## 2016-02-29 LAB — PREPARE RBC (CROSSMATCH)

## 2016-02-29 LAB — MAGNESIUM: Magnesium: 1.7 mg/dL (ref 1.7–2.4)

## 2016-02-29 LAB — PHOSPHORUS: PHOSPHORUS: 3.2 mg/dL (ref 2.5–4.6)

## 2016-02-29 MED ORDER — DIPHENOXYLATE-ATROPINE 2.5-0.025 MG PO TABS
2.0000 | ORAL_TABLET | Freq: Four times a day (QID) | ORAL | Status: DC
  Administered 2016-02-29 – 2016-03-04 (×17): 2 via ORAL
  Filled 2016-02-29 (×17): qty 2

## 2016-02-29 MED ORDER — SODIUM CHLORIDE 0.9 % IV SOLN
Freq: Once | INTRAVENOUS | Status: AC
  Administered 2016-02-29: 20:00:00 via INTRAVENOUS

## 2016-02-29 MED ORDER — ALTEPLASE 2 MG IJ SOLR
2.0000 mg | Freq: Once | INTRAMUSCULAR | Status: AC
  Administered 2016-02-29: 2 mg

## 2016-02-29 MED ORDER — POTASSIUM CHLORIDE CRYS ER 20 MEQ PO TBCR
40.0000 meq | EXTENDED_RELEASE_TABLET | ORAL | Status: AC
  Administered 2016-02-29 (×2): 40 meq via ORAL
  Filled 2016-02-29 (×2): qty 2

## 2016-02-29 MED ORDER — HYDROMORPHONE HCL 2 MG/ML IJ SOLN
0.5000 mg | INTRAMUSCULAR | Status: DC | PRN
  Administered 2016-02-29 – 2016-03-04 (×20): 0.5 mg via INTRAVENOUS
  Filled 2016-02-29 (×20): qty 1

## 2016-02-29 NOTE — Consult Note (Signed)
[place] Nurse ostomy follow up Stoma type/location: RLQ, end ileostomy Stomal assessment/size: 1 1/8" oval shaped, os at center Peristomal assessment: skin has healed, one small area aprox 0.5cm from 4-7 oclock that is reddened, but I feel this may be from the aggressive scrubbing that the patient did immediately when I took the wafer off.  All of the other skin has healed well.  Treatment options for stomal/peristomal skin: used antifungal powder on the irritated skin today, which tends to sooth the skin for this patient Output liquid green, still elevated output despite the use of immodium and fiber supplements  Ostomy pouching: 1pc./2pc.  Education provided:  Educated patient on safe removal of the pouch and how to protect the skin when removing the pouch.  Reminded the patient of how to cleanse the skin around the stoma.  Reminded patient of the importance of PO fluid replacement with each empty.  Asked patient to cut new wafer and he deferred this to [facility] nurse to perform.  I have watched the patient's wife perform this task and step by step with pictures/directions have been provided to the patient and his wife.  Enrolled patient in [place] Start Discharge program: Yes, additional supplies sent from [facility] to meet new pouching routine  WOC Nurse will follow along with you for continued support with ostomy teaching and care Jordani Nunn [facility] MSN, [place], [place], [place]

## 2016-02-29 NOTE — Progress Notes (Signed)
PROGRESS NOTE    [name]  [id number] DOB: 09-28-1965 DOA: 02/08/2016 PCP: No primary care provider on file.   Chief Complaint  Patient presents with  . Abdominal Pain    Brief Narrative:  HPI on 02/08/2016 by Ms. [name], PA (Surgery) 50 y/o male with PMH back pain and small bowel perforation s/p ileocecectomy 01/28/16 who presented to [facility] with abdominal pain. He was previously discharged from hospital on 02/02/16. Patient states his abdominal pain started late last night/early this morning and is described as sharp and non-radiating. Rates pain as 10/10. Pain is worse with movement and inspiration. No relief with PO pain medication. Associated symptoms include nausea, chills, and SOB. Patient has been having daily BMs. Denies fever, CP, vomiting, diarrhea, melena, hematochezia, urinary symptoms, and weakness. He denies regular medication use.  Interim history  He required re-admit as he developed septic shock, ischemic bowel, and an abscess. He was taken to the OR on 02/08/16 for abscess drainage, and ileocecum removal. Patient continues to have high output, per sugery, once ouptut  <1L, can be discharged. Also found to have anemia, received 1uPRBC, hemoglobin currently stable.  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 Patient currently on Augmentin (started on 02/27/2016)  Fever -Spiked fever of 101.2 -CXR unremarkable for infection  -Urine culture from 11/11 shows no growth -Patient on  antibiotics  Normocytic Anemia -hemoglobin dropped again, currently 6.5 -Transfusing 1uPRBCs  -Continue to monitor CBC -Anemia panel shows Iron 11, ferritin 257  -Given dose of feraheme -Discontinue Lovenox- placed on SCDs  L Pleural Effusion  -Small on f/u CXR -follow w/o intervention for now  -No complaints of shortness of breath  Septic shock -Resolved  Lower extremity edema -resolving  Hyopkalemia/hypomagnesemia -Replace and continue to monitor  -Magnesium 1.7 (will replace)  Hyponatremia -On NS -Resolved -Continue to monitor  AKI -Resolved  Chronic pain syndrome -Continue pain control PRN  DVT Prophylaxis  SCDs  Code Status: Full  Family Communication: None at bedside  Disposition Plan: Admitted. Discharge pending surgery recommendations (output <1L)  Consultants PCCM General surgery Infectious disease  Procedures  10/25 - readmitted - Intubated and central access obtained - Exploratory surgery, wound vac placement and ileocectomy  10/27 - Exploratory surgery-peritonitis, distal ileum ischemia , s/p resection w/ ileostomy  10/29 - Exploratory laparotomy with removal of vacuum pack dressing and closure of abdominal wall  10/30 - Extubated 11/1 - [facility] assumed care  Antibiotics   Anti-infectives    Start     Dose/Rate Route Frequency Ordered Stop   02/27/16 1345  amoxicillin-clavulanate (AUGMENTIN) 875-125 MG per tablet 1 tablet     1 tablet Oral Every 12 hours 02/27/16 1337 03/05/16 0959   02/26/16 1400  piperacillin-tazobactam (ZOSYN) IVPB 3.375 g  Status:  Discontinued     3.375 g 12.5 mL/hr over 240 Minutes Intravenous Every 8 hours 02/26/16 1059 02/27/16 1337   02/26/16 1045  metroNIDAZOLE (FLAGYL) IVPB 500 mg  Status:  Discontinued     500 mg 100 mL/hr over 60 Minutes Intravenous Every 8 hours 02/26/16 1038 02/26/16 1100   02/18/16 1400  tobramycin (NEBCIN) 490 mg in dextrose 5 % 100 mL IVPB     7 mg/kg  70.7 kg (Ideal) 112.3 mL/hr over  60 Minutes Intravenous Every 24 hours 02/18/16 1041 02/20/16 1453   02/15/16 0031  tigecycline (TYGACIL) 50 mg in sodium chloride 0.9 % 100 mL IVPB     50 mg 200 mL/hr over 30 Minutes Intravenous Every 12 hours 02/14/16 1232 02/20/16 1325   02/14/16 1400  tobramycin (NEBCIN) 510 mg in dextrose 5 % 100 mL IVPB  Status:  Discontinued     7 mg/kg  72.9 kg 112.8 mL/hr over 60 Minutes Intravenous Every 24 hours 02/14/16 1241 02/18/16 1041   02/14/16 1245  tigecycline (TYGACIL) 100 mg in sodium chloride 0.9 % 100 mL IVPB     100 mg 200 mL/hr over 30 Minutes Intravenous  Once 02/14/16 1232 02/14/16 1342   02/13/16 1200  cefTRIAXone (ROCEPHIN) 2 g in dextrose 5 % 50 mL IVPB  Status:  Discontinued     2 g 100 mL/hr over 30 Minutes Intravenous Every 24 hours 02/13/16 0857 02/14/16 1205   02/13/16 1200  tobramycin (NEBCIN) 530 mg in dextrose 5 % 100 mL IVPB  Status:  Discontinued     7 mg/kg  75.5 kg (Adjusted) 113.3 mL/hr over 60 Minutes Intravenous Every 24 hours 02/13/16 1123 02/14/16 1234   02/10/16 1500  anidulafungin (ERAXIS) 100 mg in sodium chloride 0.9 % 100 mL IVPB     100 mg over 90 Minutes Intravenous Every 24 hours 02/09/16 0804 02/15/16 1556   02/10/16 1500  vancomycin (VANCOCIN) IVPB 1000 mg/200 mL premix  Status:  Discontinued     1,000 mg 200 mL/hr over 60 Minutes Intravenous Every 8 hours 02/10/16 1239 02/13/16 0856   02/10/16 1500  imipenem-cilastatin (PRIMAXIN) 1,000 mg in sodium chloride 0.9 % 250 mL IVPB  Status:  Discontinued     1,000 mg 250 mL/hr over 60 Minutes Intravenous Every 8 hours 02/10/16 1239 02/13/16 0856   02/09/16 1000  imipenem-cilastatin (PRIMAXIN) 1,000 mg in sodium chloride 0.9 % 250 mL IVPB  Status:  Discontinued     1,000 mg 250 mL/hr over 60 Minutes Intravenous Every 8 hours 02/09/16 0812 02/10/16 1239   02/09/16 1000  vancomycin (VANCOCIN) IVPB 1000 mg/200 mL premix  Status:  Discontinued     1,000 mg 200 mL/hr over 60 Minutes Intravenous Every 8  hours 02/09/16 0812 02/10/16 1239   02/09/16 0900  anidulafungin (ERAXIS) 200 mg in sodium chloride 0.9 % 200 mL IVPB     200 mg over 180 Minutes Intravenous  Once 02/09/16 0804 02/09/16 1209   02/09/16 0830  anidulafungin (ERAXIS) 100 mg in sodium chloride 0.9 % 100 mL IVPB  Status:  Discontinued     100 mg over 90 Minutes Intravenous Every 24 hours 02/08/16 0811 02/09/16 0804   02/08/16 1800  vancomycin (VANCOCIN) IVPB 750 mg/150 ml premix  Status:  Discontinued     750 mg 150 mL/hr over 60 Minutes Intravenous Every 8 hours 02/08/16 0847 02/09/16 0812   02/08/16 1400  piperacillin-tazobactam (ZOSYN) IVPB 3.375 g  Status:  Discontinued     3.375 g 12.5 mL/hr over 240 Minutes Intravenous Every 8 hours 02/08/16 0432 02/08/16 0802   02/08/16 0900  imipenem-cilastatin (PRIMAXIN) 500 mg in sodium chloride 0.9 % 100 mL IVPB  Status:  Discontinued     500 mg 200 mL/hr over 30 Minutes Intravenous Every 6 hours 02/08/16 0818 02/09/16 0812   02/08/16 0830  ertapenem (  INVANZ) 1 g in sodium chloride 0.9 % 50 mL IVPB  Status:  Discontinued     1 g 100 mL/hr over 30 Minutes Intravenous Every 24 hours 02/08/16 0756 02/08/16 0812   02/08/16 0830  anidulafungin (ERAXIS) 200 mg in sodium chloride 0.9 % 200 mL IVPB  Status:  Discontinued     200 mg over 180 Minutes Intravenous  Once 02/08/16 0811 02/09/16 1457   02/08/16 0830  vancomycin (VANCOCIN) 1,500 mg in sodium chloride 0.9 % 500 mL IVPB  Status:  Discontinued     1,500 mg 250 mL/hr over 120 Minutes Intravenous  Once 02/08/16 0815 02/09/16 1355   02/08/16 0815  vancomycin (VANCOCIN) 1,500 mg in sodium chloride 0.9 % 500 mL IVPB  Status:  Discontinued     1,500 mg 250 mL/hr over 120 Minutes Intravenous Every 12 hours 02/08/16 0811 02/08/16 0812   02/08/16 0430  piperacillin-tazobactam (ZOSYN) IVPB 3.375 g     3.375 g 100 mL/hr over 30 Minutes Intravenous  Once 02/08/16 0419 02/08/16 0532      Subjective:   [name] seen and examined today.   Patient feels well this morning.  No new complaints.  Still feels his output is high.  Wishes he could go home.  Denies chest pain, shortness of breath, nausea, vomiting, dizziness, headache.   Objective:   Vitals:   02/28/16 1350 02/28/16 2136 02/28/16 2138 02/29/16 0606  BP: (!) 106/58 (!) 107/56 (!) 107/56 (!) 106/55  Pulse: 81 91 91 80  Resp: 18  18 18   Temp: 98.2 F (36.8 C)  (!) 101.2 F (38.4 C) 99.3 F (37.4 C)  TempSrc: Oral  Oral Oral  SpO2: 99%  94% 99%  Weight:      Height:        Intake/Output Summary (Last 24 hours) at 02/29/16 1253 Last data filed at 02/29/16 1229  Gross per 24 hour  Intake             2700 ml  Output             4650 ml  Net            -1950 ml   Filed Weights   02/13/16 0350 02/14/16 0400 02/16/16 0400  Weight: 82.6 kg (182 lb 1.6 oz) 72.9 kg (160 lb 11.5 oz) 55.9 kg (123 lb 3.8 oz)    Exam  General: Well developed, well nourished, NAD  HEENT: NCAT,mucous membranes moist.   Cardiovascular: S1 S2 auscultated, no murmurs, RRR  Respiratory: Clear to auscultation bilaterally with equal chest rise  Abdomen: Soft, nontender, nondistended, + bowel sounds, + ileostomy  Extremities: warm dry without cyanosis clubbing or edema  Neuro: AAOx3, nonfocal   Psych: Normal affect and demeanor, pleasant   Data Reviewed: I have personally reviewed following labs and imaging studies  CBC:  Recent Labs Lab 02/24/16 0427 02/25/16 0450 02/26/16 0500 02/27/16 0440 02/29/16 0500  WBC 25.3* 25.6* 14.5* 10.0 8.2  NEUTROABS  --   --  10.2* 7.1 4.9  HGB 9.7* 8.6* 7.1* 7.7* 6.5*  HCT 29.2* 26.3* 21.0* 23.5* 20.1*  MCV 83.0 83.5 83.3 83.6 85.2  PLT 412* 329 280 355 [id number]   Basic Metabolic Panel:  Recent Labs Lab 02/25/16 0410 02/26/16 0500 02/27/16 0440 02/28/16 0500 02/29/16 0500  NA 129* 131* 134* 135 138  K 3.4* 2.9* 3.2* 2.8* 3.3*  CL 99* 100* 102 104 109  CO2 23 24 25 24 23   GLUCOSE 81 93 110* 118*  344*  BUN 5* 5* <5* <5* <5*    CREATININE 0.53* 0.59* 0.55* 0.51* 0.45*  CALCIUM 7.5* 7.4* 7.9* 7.7* 7.2*  MG 1.6* 2.1 1.9 1.6* 1.7  PHOS 2.5 2.8 3.0 3.0 3.2   GFR: Estimated Creatinine Clearance: 88.3 mL/min (by C-G formula based on SCr of 0.45 mg/dL (L)). Liver Function Tests: No results for input(s): AST, ALT, ALKPHOS, BILITOT, PROT, ALBUMIN in the last 168 hours. No results for input(s): LIPASE, AMYLASE in the last 168 hours. No results for input(s): AMMONIA in the last 168 hours. Coagulation Profile: No results for input(s): INR, PROTIME in the last 168 hours. Cardiac Enzymes: No results for input(s): CKTOTAL, CKMB, CKMBINDEX, TROPONINI in the last 168 hours. BNP (last 3 results) No results for input(s): PROBNP in the last 8760 hours. HbA1C: No results for input(s): HGBA1C in the last 72 hours. CBG: No results for input(s): GLUCAP in the last 168 hours. Lipid Profile: No results for input(s): CHOL, HDL, LDLCALC, TRIG, CHOLHDL, LDLDIRECT in the last 72 hours. Thyroid Function Tests: No results for input(s): TSH, T4TOTAL, FREET4, T3FREE, THYROIDAB in the last 72 hours. Anemia Panel: No results for input(s): VITAMINB12, FOLATE, FERRITIN, TIBC, IRON, RETICCTPCT in the last 72 hours. Urine analysis:    Component Value Date/Time   COLORURINE YELLOW 02/25/2016 1344   APPEARANCEUR CLEAR 02/25/2016 1344   LABSPEC 1.006 02/25/2016 1344   PHURINE 6.0 02/25/2016 1344   GLUCOSEU NEGATIVE 02/25/2016 1344   HGBUR NEGATIVE 02/25/2016 1344   BILIRUBINUR NEGATIVE 02/25/2016 1344   KETONESUR NEGATIVE 02/25/2016 1344   PROTEINUR NEGATIVE 02/25/2016 1344   UROBILINOGEN 1.0 07/28/2007 1539   NITRITE NEGATIVE 02/25/2016 1344   LEUKOCYTESUR NEGATIVE 02/25/2016 1344   Sepsis Labs: @LABRCNTIP (procalcitonin:4,lacticidven:4)  ) Recent Results (from the past 240 hour(s))  Urine culture     Status: None   Collection Time: 02/25/16  1:43 PM  Result Value Ref Range Status   Specimen Description URINE, RANDOM  Final    Special Requests NONE  Final   Culture NO GROWTH  Final   Report Status 02/26/2016 FINAL  Final      Radiology Studies: Dg Chest 2 View  Result Date: 02/29/2016 CLINICAL DATA:  Fever. EXAM: CHEST  2 VIEW COMPARISON:  02/15/2016 FINDINGS: Again noted are patchy densities at the left lung base. Overall, the aeration at the lung bases has improved. Surgical hardware in the lower thoracic spine. PICC line tip is near the superior cavoatrial based on the lateral view. Heart size is within normal limits. The trachea is midline. Cannot exclude trace left pleural fluid. IMPRESSION: Improved aeration at the lung bases with residual patchy densities at left lung base. These densities may represent areas of atelectasis. Electronically Signed   By: Markus Daft M.D.   On: 02/29/2016 08:41     Scheduled Meds: . sodium chloride   Intravenous Once  . sodium chloride   Intravenous Once  . amoxicillin-clavulanate  1 tablet Oral Q12H  . diphenoxylate-atropine  2 tablet Oral QID  . feeding supplement (ENSURE ENLIVE)  237 mL Oral BID BM  . ferrous sulfate  325 mg Oral TID WC  . loperamide  8 mg Oral QID  . loratadine  10 mg Oral Daily  . magnesium oxide  400 mg Oral TID  . oxymorphone  40 mg Oral TID  . pantoprazole  40 mg Oral Daily  . potassium & sodium phosphates  1 packet Oral TID WC & HS  . psyllium  1 packet Oral TID  Continuous Infusions: . dextrose 5 % and 0.9% NaCl 125 mL/hr at 02/29/16 0021     LOS: 21 days   Time Spent in minutes   30 minutes  Ajiah Mcglinn D.O. on 02/29/2016 at 12:53 PM  Between 7am to 7pm - Pager - [phone]  After 7pm go to [email or web address] - password [email or web address]  And look for the night coverage person covering for me after hours  [facility] Office  [phone]

## 2016-02-29 NOTE — Progress Notes (Addendum)
CRITICAL VALUE ALERT  Critical value received:  hgb 6.5  Date of notification:  02/29/16  Time of notification:  0654  Critical value read back:Yes.    Nurse who received alert:  S.Caprice Beaver  MD notified (1st page):  Baltazar Najjar  Time of first page:  [phone]  Responding MD: Samuella Bruin  Time MD responded: 0700 Kirby,NP placed order for 1 unit of blood to be transfused.

## 2016-02-29 NOTE — Progress Notes (Signed)
Spoke with pt's RN Judson Roch) today regarding PICC. Informed RN that if pt. Goes home soon with PICC that he would need to have his triple lumen PICC exchanged for a single lumen PICC. She did not know if there were any plans for d/c as of yet.

## 2016-02-29 NOTE — Progress Notes (Signed)
Physical Therapy Treatment Patient Details Name: [name] MRN: [id number] DOB: 1965/05/25 Today's Date: 02/29/2016    History of Present Illness Patient is a 50 y/o male admitted with septic shock and exp lap on 10/25 with ileocectomy, 10/27 ileostomy, VDRF 10/25-10/30. Pt with recent admission for small bowel perf on 10/13 with resection and D/C 10/19. PMHx asthma , back sx    PT Comments    The pt has met all of his goals and agreed to utilize RW today. Will inform supervising PT of patient progress.  Follow Up Recommendations  Home health PT;Supervision for mobility/OOB     Equipment Recommendations  Rolling walker with 5" wheels;3in1 (PT)    Recommendations for Other Services OT consult     Precautions / Restrictions Precautions Precaution Comments: ostomy Restrictions Weight Bearing Restrictions: No    Mobility  Bed Mobility Overal bed mobility: Modified Independent Bed Mobility: Supine to Sit   Sidelying to sit: Modified independent (Device/Increase time) Supine to sit: Modified independent (Device/Increase time)     General bed mobility comments: Pt understands the importance of safety now.  Transfers Overall transfer level: Modified independent Equipment used: Rolling walker (2 wheeled) Transfers: Sit to/from Stand Sit to Stand: Modified independent (Device/Increase time)         General transfer comment: Pt agreed to use RW today but would not allow adjustment of it.  SPTA educated pt on importance of correct height of RW for use at home.  Ambulation/Gait Ambulation/Gait assistance: Modified independent (Device/Increase time) Ambulation Distance (Feet): 150 Feet Assistive device: Rolling walker (2 wheeled) Gait Pattern/deviations: Step-through pattern;Trunk flexed;Decreased dorsiflexion - left;Decreased stride length   Gait velocity interpretation: Below normal speed for age/gender General Gait Details: Pt needed verbal cues for correct  posture.   Stairs            Wheelchair Mobility    Modified Rankin (Stroke Patients Only)       Balance     Sitting balance-Leahy Scale: Good     Standing balance support: Bilateral upper extremity supported;During functional activity   Standing balance comment: Is able to stand without UE support but used RW during gait training.                    Cognition Arousal/Alertness: Awake/alert Behavior During Therapy: WFL for tasks assessed/performed Overall Cognitive Status: Within Functional Limits for tasks assessed                      Exercises Total Joint Exercises Marching in Standing: AROM;Both;10 reps;Seated General Exercises - Lower Extremity Toe Raises: AROM;Both;10 reps;Seated    General Comments        Pertinent Vitals/Pain Pain Assessment: Faces Faces Pain Scale: Hurts a little bit Pain Location: back and abdomen Pain Descriptors / Indicators: Grimacing Pain Intervention(s): Limited activity within patient's tolerance;Monitored during session    Home Living                      Prior Function            PT Goals (current goals can now be found in the care plan section) Acute Rehab PT Goals Patient Stated Goal: return home PT Goal Formulation: With patient Time For Goal Achievement: 02/29/16 Potential to Achieve Goals: Good Progress towards PT goals:  (Goals met and will inform supervising PT.)    Frequency    Min 3X/week      PT Plan Current plan remains appropriate  Co-evaluation             End of Session   Activity Tolerance: Patient tolerated treatment well Patient left: in bed;with call bell/phone within reach;with family/visitor present     Time: 0165-5374 PT Time Calculation (min) (ACUTE ONLY): 12 min  Charges:  $Gait Training: 8-22 mins                    G Codes:      [name] 03/26/16, 5:07 PM [name]. [name], [name]

## 2016-02-29 NOTE — Progress Notes (Signed)
Patient ID: [name], male   DOB: 11-26-1965, 50 y.o.   MRN: [id number]  [facility] Surgery Progress Note  17 Days Post-Op  Subjective: No new complaints. Patient did spike a temp of 101.2 yesterday, most recent readying 99.3. Abdomen sore but no increased pain. Patient denies CP, SOB, cough, dysuria, or urinary frequency.  Hg dropped to 6.5 this AM, patient receiving 1 uPRBC.  Objective: Vital signs in last 24 hours: Temp:  [98.2 F (36.8 C)-101.2 F (38.4 C)] 99.3 F (37.4 C) (11/15 0606) Pulse Rate:  [80-91] 80 (11/15 0606) Resp:  [18] 18 (11/15 0606) BP: (106-120)/(55-58) 106/55 (11/15 0606) SpO2:  [94 %-99 %] 99 % (11/15 0606) Last BM Date: 02/28/16  Intake/Output from previous day: 11/14 0701 - 11/15 0700 In: 3000 [P.O.:1500; I.V.:1500] Out: [id number] [Urine:3510; Stool:2125] Intake/Output this shift: No intake/output data recorded.  PE: Gen: Alert, NAD Card: RRR Pulm: CTAB Abd: Soft, NT/ND, +BS, midline incision clean and dry with only a small area at distal and proximal aspects minimally open and trace drainage from distal aspect, ileostomy functioning with liquid brown stool in bag  Lab Results:   Recent Labs  02/27/16 0440 02/29/16 0500  WBC 10.0 8.2  HGB 7.7* 6.5*  HCT 23.5* 20.1*  PLT 355 213   BMET  Recent Labs  02/28/16 0500 02/29/16 0500  NA 135 138  K 2.8* 3.3*  CL 104 109  CO2 24 23  GLUCOSE 118* 344*  BUN <5* <5*  CREATININE 0.51* 0.45*  CALCIUM 7.7* 7.2*   PT/INR No results for input(s): LABPROT, INR in the last 72 hours. CMP     Component Value Date/Time   NA 138 02/29/2016 0500   K 3.3 (L) 02/29/2016 0500   CL 109 02/29/2016 0500   CO2 23 02/29/2016 0500   GLUCOSE 344 (H) 02/29/2016 0500   BUN <5 (L) 02/29/2016 0500   CREATININE 0.45 (L) 02/29/2016 0500   CALCIUM 7.2 (L) 02/29/2016 0500   PROT 5.1 (L) 02/20/2016 0534   ALBUMIN 1.6 (L) 02/20/2016 0534   AST 21 02/20/2016 0534   ALT 18 02/20/2016 0534   ALKPHOS  127 (H) 02/20/2016 0534   BILITOT 0.6 02/20/2016 0534   GFRNONAA >60 02/29/2016 0500   GFRAA >60 02/29/2016 0500   Lipase     Component Value Date/Time   LIPASE 16 02/08/2016 0412       Studies/Results: Dg Chest 2 View  Result Date: 02/29/2016 CLINICAL DATA:  Fever. EXAM: CHEST  2 VIEW COMPARISON:  02/15/2016 FINDINGS: Again noted are patchy densities at the left lung base. Overall, the aeration at the lung bases has improved. Surgical hardware in the lower thoracic spine. PICC line tip is near the superior cavoatrial based on the lateral view. Heart size is within normal limits. The trachea is midline. Cannot exclude trace left pleural fluid. IMPRESSION: Improved aeration at the lung bases with residual patchy densities at left lung base. These densities may represent areas of atelectasis. Electronically Signed   By: Markus Daft M.D.   On: 02/29/2016 08:41    Anti-infectives: Anti-infectives    Start     Dose/Rate Route Frequency Ordered Stop   02/27/16 1345  amoxicillin-clavulanate (AUGMENTIN) 875-125 MG per tablet 1 tablet     1 tablet Oral Every 12 hours 02/27/16 1337 03/05/16 0959   02/26/16 1400  piperacillin-tazobactam (ZOSYN) IVPB 3.375 g  Status:  Discontinued     3.375 g 12.5 mL/hr over 240 Minutes Intravenous Every 8 hours 02/26/16  1059 02/27/16 1337   02/26/16 1045  metroNIDAZOLE (FLAGYL) IVPB 500 mg  Status:  Discontinued     500 mg 100 mL/hr over 60 Minutes Intravenous Every 8 hours 02/26/16 1038 02/26/16 1100   02/18/16 1400  tobramycin (NEBCIN) 490 mg in dextrose 5 % 100 mL IVPB     7 mg/kg  70.7 kg (Ideal) 112.3 mL/hr over 60 Minutes Intravenous Every 24 hours 02/18/16 1041 02/20/16 1453   02/15/16 0031  tigecycline (TYGACIL) 50 mg in sodium chloride 0.9 % 100 mL IVPB     50 mg 200 mL/hr over 30 Minutes Intravenous Every 12 hours 02/14/16 1232 02/20/16 1325   02/14/16 1400  tobramycin (NEBCIN) 510 mg in dextrose 5 % 100 mL IVPB  Status:  Discontinued     7  mg/kg  72.9 kg 112.8 mL/hr over 60 Minutes Intravenous Every 24 hours 02/14/16 1241 02/18/16 1041   02/14/16 1245  tigecycline (TYGACIL) 100 mg in sodium chloride 0.9 % 100 mL IVPB     100 mg 200 mL/hr over 30 Minutes Intravenous  Once 02/14/16 1232 02/14/16 1342   02/13/16 1200  cefTRIAXone (ROCEPHIN) 2 g in dextrose 5 % 50 mL IVPB  Status:  Discontinued     2 g 100 mL/hr over 30 Minutes Intravenous Every 24 hours 02/13/16 0857 02/14/16 1205   02/13/16 1200  tobramycin (NEBCIN) 530 mg in dextrose 5 % 100 mL IVPB  Status:  Discontinued     7 mg/kg  75.5 kg (Adjusted) 113.3 mL/hr over 60 Minutes Intravenous Every 24 hours 02/13/16 1123 02/14/16 1234   02/10/16 1500  anidulafungin (ERAXIS) 100 mg in sodium chloride 0.9 % 100 mL IVPB     100 mg over 90 Minutes Intravenous Every 24 hours 02/09/16 0804 02/15/16 1556   02/10/16 1500  vancomycin (VANCOCIN) IVPB 1000 mg/200 mL premix  Status:  Discontinued     1,000 mg 200 mL/hr over 60 Minutes Intravenous Every 8 hours 02/10/16 1239 02/13/16 0856   02/10/16 1500  imipenem-cilastatin (PRIMAXIN) 1,000 mg in sodium chloride 0.9 % 250 mL IVPB  Status:  Discontinued     1,000 mg 250 mL/hr over 60 Minutes Intravenous Every 8 hours 02/10/16 1239 02/13/16 0856   02/09/16 1000  imipenem-cilastatin (PRIMAXIN) 1,000 mg in sodium chloride 0.9 % 250 mL IVPB  Status:  Discontinued     1,000 mg 250 mL/hr over 60 Minutes Intravenous Every 8 hours 02/09/16 0812 02/10/16 1239   02/09/16 1000  vancomycin (VANCOCIN) IVPB 1000 mg/200 mL premix  Status:  Discontinued     1,000 mg 200 mL/hr over 60 Minutes Intravenous Every 8 hours 02/09/16 0812 02/10/16 1239   02/09/16 0900  anidulafungin (ERAXIS) 200 mg in sodium chloride 0.9 % 200 mL IVPB     200 mg over 180 Minutes Intravenous  Once 02/09/16 0804 02/09/16 1209   02/09/16 0830  anidulafungin (ERAXIS) 100 mg in sodium chloride 0.9 % 100 mL IVPB  Status:  Discontinued     100 mg over 90 Minutes Intravenous Every  24 hours 02/08/16 0811 02/09/16 0804   02/08/16 1800  vancomycin (VANCOCIN) IVPB 750 mg/150 ml premix  Status:  Discontinued     750 mg 150 mL/hr over 60 Minutes Intravenous Every 8 hours 02/08/16 0847 02/09/16 0812   02/08/16 1400  piperacillin-tazobactam (ZOSYN) IVPB 3.375 g  Status:  Discontinued     3.375 g 12.5 mL/hr over 240 Minutes Intravenous Every 8 hours 02/08/16 0432 02/08/16 0802   02/08/16 0900  imipenem-cilastatin (PRIMAXIN) 500 mg in sodium chloride 0.9 % 100 mL IVPB  Status:  Discontinued     500 mg 200 mL/hr over 30 Minutes Intravenous Every 6 hours 02/08/16 0818 02/09/16 0812   02/08/16 0830  ertapenem (INVANZ) 1 g in sodium chloride 0.9 % 50 mL IVPB  Status:  Discontinued     1 g 100 mL/hr over 30 Minutes Intravenous Every 24 hours 02/08/16 0756 02/08/16 0812   02/08/16 0830  anidulafungin (ERAXIS) 200 mg in sodium chloride 0.9 % 200 mL IVPB  Status:  Discontinued     200 mg over 180 Minutes Intravenous  Once 02/08/16 0811 02/09/16 1457   02/08/16 0830  vancomycin (VANCOCIN) 1,500 mg in sodium chloride 0.9 % 500 mL IVPB  Status:  Discontinued     1,500 mg 250 mL/hr over 120 Minutes Intravenous  Once 02/08/16 0815 02/09/16 1355   02/08/16 0815  vancomycin (VANCOCIN) 1,500 mg in sodium chloride 0.9 % 500 mL IVPB  Status:  Discontinued     1,500 mg 250 mL/hr over 120 Minutes Intravenous Every 12 hours 02/08/16 0811 02/08/16 0812   02/08/16 0430  piperacillin-tazobactam (ZOSYN) IVPB 3.375 g     3.375 g 100 mL/hr over 30 Minutes Intravenous  Once 02/08/16 0419 02/08/16 0532       Assessment/Plan Crohns disease s/p ileocecectomy 01/27/16 for SB perforation Anastomotic leak/ peritoneal abscess - s/p exp lap/ resection of anastomosis 02/08/16 Washout/ VAC change 02/10/16 Washout/ fascial closure 02/12/16 - Ileostomy with increased output 2125 cc/24hr. Continue immodium 8mg  QID and metamucil, increase lomotil to 2 tablets QID. Spoke with pharmacy and could also consider  trying paregoric again but add to Ensure to make it taste better (will wait on this for now) - CT 11/11 showed multiple small fluid collections. Patient was on zosyn x2 days then switched to augmentin 11/13 - WBC this AM WNL 8.2 but patient did spike temp of 101.2 yesterday. CXR ok, UA/urine cx pending. Exam benign.  Magnesium and Phosphorous WNL today, continued hypokalemia - repleting Anemia - hg 6.5, receiving 1 uPRBC this MA Chronic pain syndrome - on home meds (opana 40mg  TID), also still requiring some additional dilaudid  ID - zosyn 11/12>>11/13. Augmentin 11/13 (day #3/8)>> VTE - SCDs FEN - dysphagia 2 diet, Ensure  Plan: fever of unknown origin, CXR WNL and UA/urine cx pending.  continue augmentin BID (day #3/8). Ileostomy output increased last 24hr, will increase lomotil to 2 tablets BID and continue imodium/metamucil. Plan to go home once output <1L with reasonable consistency. Wound healing, continue wet to dry dressings to midline wound. Continue PT/mobilization, IS.   LOS: 21 days    Jerrye Beavers , [facility] Surgery 02/29/2016, 8:52 AM Pager: [phone] Consults: [phone] Mon-Fri 7:00 am-4:30 pm Sat-Sun 7:00 am-11:30 am

## 2016-03-01 LAB — TYPE AND SCREEN
ABO/RH(D): A NEG
Antibody Screen: NEGATIVE
Unit division: 0

## 2016-03-01 LAB — URINE CULTURE: Culture: NO GROWTH

## 2016-03-01 LAB — BASIC METABOLIC PANEL
ANION GAP: 6 (ref 5–15)
BUN: <5 mg/dL — ABNORMAL LOW (ref 6–20)
CALCIUM: 7.7 mg/dL — AB (ref 8.9–10.3)
CO2: 24 mmol/L (ref 22–32)
Chloride: 107 mmol/L (ref 101–111)
Creatinine, Ser: 0.49 mg/dL — ABNORMAL LOW (ref 0.61–1.24)
GLUCOSE: 244 mg/dL — AB (ref 65–99)
Potassium: 3.3 mmol/L — ABNORMAL LOW (ref 3.5–5.1)
SODIUM: 137 mmol/L (ref 135–145)

## 2016-03-01 LAB — CBC
HCT: 25.6 % — ABNORMAL LOW (ref 39.0–52.0)
HEMOGLOBIN: 8.4 g/dL — AB (ref 13.0–17.0)
MCH: 27.5 pg (ref 26.0–34.0)
MCHC: 32.8 g/dL (ref 30.0–36.0)
MCV: 83.7 fL (ref 78.0–100.0)
Platelets: 324 10*3/uL (ref 150–400)
RBC: 3.06 MIL/uL — AB (ref 4.22–5.81)
RDW: 16.9 % — ABNORMAL HIGH (ref 11.5–15.5)
WBC: 8.9 10*3/uL (ref 4.0–10.5)

## 2016-03-01 LAB — MAGNESIUM: MAGNESIUM: 1.7 mg/dL (ref 1.7–2.4)

## 2016-03-01 LAB — PHOSPHORUS: PHOSPHORUS: 3.7 mg/dL (ref 2.5–4.6)

## 2016-03-01 MED ORDER — POTASSIUM & SODIUM PHOSPHATES 280-160-250 MG PO PACK
1.0000 | PACK | Freq: Four times a day (QID) | ORAL | Status: DC
  Administered 2016-03-01 – 2016-03-04 (×13): 1 via ORAL
  Filled 2016-03-01 (×15): qty 1

## 2016-03-01 MED ORDER — PSYLLIUM 95 % PO PACK
2.0000 | PACK | Freq: Three times a day (TID) | ORAL | Status: DC
  Administered 2016-03-01 – 2016-03-04 (×9): 2 via ORAL
  Filled 2016-03-01 (×10): qty 2

## 2016-03-01 NOTE — Consult Note (Signed)
[place] Nurse ostomy follow up Stoma type/location: RLQ, end ileostomy Stomal assessment/size: 1 1/8" oval, budded Peristomal assessment: pouch intact Treatment options for stomal/peristomal skin: using antifungal powder Output liquid green Ostomy pouching: 1pc.with 2" barrier ring   Feel that patient and wife are prepared to care for stoma at home, high risk for dehydration.  May be of value to have patient record intake and output at home to make sure he is staying ahead with fluid intake.  Would suggest [facility] for continued support for ostomy teaching.   [place] Nurse will follow along with you for continued support with ostomy teaching and care Meaghan Whistler [facility], RN, [place], [place]

## 2016-03-01 NOTE — Progress Notes (Signed)
PROGRESS NOTE    [name]  [id number] DOB: [birth year] DOA: 02/08/2016 PCP: No primary care provider on file.   Chief Complaint  Patient presents with  . Abdominal Pain    Brief Narrative:  HPI on 02/08/2016 by Ms. [name], PA (Surgery) 50 y/o male with PMH back pain and small bowel perforation s/p ileocecectomy 01/28/16 who presented to [facility] with abdominal pain. He was previously discharged from hospital on 02/02/16. Patient states his abdominal pain started late last night/early this morning and is described as sharp and non-radiating. Rates pain as 10/10. Pain is worse with movement and inspiration. No relief with PO pain medication. Associated symptoms include nausea, chills, and SOB. Patient has been having daily BMs. Denies fever, CP, vomiting, diarrhea, melena, hematochezia, urinary symptoms, and weakness. He denies regular medication use.  Interim history  He required re-admit as he developed septic shock, ischemic bowel, and an abscess. He was taken to the OR on 02/08/16 for abscess drainage, and ileocecum removal. Patient continues to have high output, per sugery, once ouptut  <1L, can be discharged. Also found to have anemia, received 1uPRBC, hemoglobin currently stable.  Assessment & Plan   Ischemic bowel - perf at anastomosis site - abdominal abscesses - s/p ileostomy -Ongoing care per general surgery -Antibiotic management per ID: Completed Tigecycline and tobramycin through 02/20/2016 -Drainage/leakage noted from ileostomy site- improved -Wound care/ostomy consulted -Continue metamucil and imodium, lomotil  -Continues to have high output, once output <1L, patient can be discharged home (per surgery)- over past 24 hours: 1950 -Repeat CT scan on 11/11: persistent Diffuse peritonitis, 2 small abscesses. Patient currently on Augmentin (started on 02/27/2016)  Fever -Spiked fever of 101.2 on 11/15 -CXR unremarkable for infection  -Urine culture from 11/11  shows no growth -Patient on antibiotics  Normocytic Anemia -hemoglobin dropped again, currently 8.4 (given 1uPRBC on 11/15) -Continue to monitor CBC -Anemia panel shows Iron 11, ferritin 257  -Given dose of feraheme -Discontinue Lovenox- placed on SCDs  L Pleural Effusion  -Small on f/u CXR -follow w/o intervention for now  -No complaints of shortness of breath  Septic shock -Resolved  Lower extremity edema -resolving  Hyopkalemia/hypomagnesemia -Replace and continue to monitor  -Magnesium 1.7 (will replace)  Hyponatremia -On NS -Resolved -Continue to monitor  AKI -Resolved  Chronic pain syndrome -Continue pain control PRN  DVT Prophylaxis  SCDs  Code Status: Full  Family Communication: None at bedside  Disposition Plan: Admitted. Discharge pending surgery recommendations (output <1L)  Consultants PCCM General surgery Infectious disease  Procedures  10/25 - readmitted - Intubated and central access obtained - Exploratory surgery, wound vac placement and ileocectomy  10/27 - Exploratory surgery-peritonitis, distal ileum ischemia , s/p resection w/ ileostomy  10/29 - Exploratory laparotomy with removal of vacuum pack dressing and closure of abdominal wall  10/30 - Extubated 11/1 - TRH assumed care  Antibiotics   Anti-infectives    Start     Dose/Rate Route Frequency Ordered Stop   02/27/16 1345  amoxicillin-clavulanate (AUGMENTIN) 875-125 MG per tablet 1 tablet     1 tablet Oral Every 12 hours 02/27/16 1337 03/05/16 0959   02/26/16 1400  piperacillin-tazobactam (ZOSYN) IVPB 3.375 g  Status:  Discontinued     3.375 g 12.5 mL/hr over 240 Minutes Intravenous Every 8 hours 02/26/16 1059 02/27/16 1337   02/26/16 1045  metroNIDAZOLE (FLAGYL) IVPB 500 mg  Status:  Discontinued     500 mg 100 mL/hr over 60 Minutes Intravenous Every 8 hours 02/26/16  1038 02/26/16 1100   02/18/16 1400  tobramycin (NEBCIN) 490 mg in dextrose 5 % 100 mL IVPB     7 mg/kg   70.7 kg (Ideal) 112.3 mL/hr over 60 Minutes Intravenous Every 24 hours 02/18/16 1041 02/20/16 1453   02/15/16 0031  tigecycline (TYGACIL) 50 mg in sodium chloride 0.9 % 100 mL IVPB     50 mg 200 mL/hr over 30 Minutes Intravenous Every 12 hours 02/14/16 1232 02/20/16 1325   02/14/16 1400  tobramycin (NEBCIN) 510 mg in dextrose 5 % 100 mL IVPB  Status:  Discontinued     7 mg/kg  72.9 kg 112.8 mL/hr over 60 Minutes Intravenous Every 24 hours 02/14/16 1241 02/18/16 1041   02/14/16 1245  tigecycline (TYGACIL) 100 mg in sodium chloride 0.9 % 100 mL IVPB     100 mg 200 mL/hr over 30 Minutes Intravenous  Once 02/14/16 1232 02/14/16 1342   02/13/16 1200  cefTRIAXone (ROCEPHIN) 2 g in dextrose 5 % 50 mL IVPB  Status:  Discontinued     2 g 100 mL/hr over 30 Minutes Intravenous Every 24 hours 02/13/16 0857 02/14/16 1205   02/13/16 1200  tobramycin (NEBCIN) 530 mg in dextrose 5 % 100 mL IVPB  Status:  Discontinued     7 mg/kg  75.5 kg (Adjusted) 113.3 mL/hr over 60 Minutes Intravenous Every 24 hours 02/13/16 1123 02/14/16 1234   02/10/16 1500  anidulafungin (ERAXIS) 100 mg in sodium chloride 0.9 % 100 mL IVPB     100 mg over 90 Minutes Intravenous Every 24 hours 02/09/16 0804 02/15/16 1556   02/10/16 1500  vancomycin (VANCOCIN) IVPB 1000 mg/200 mL premix  Status:  Discontinued     1,000 mg 200 mL/hr over 60 Minutes Intravenous Every 8 hours 02/10/16 1239 02/13/16 0856   02/10/16 1500  imipenem-cilastatin (PRIMAXIN) 1,000 mg in sodium chloride 0.9 % 250 mL IVPB  Status:  Discontinued     1,000 mg 250 mL/hr over 60 Minutes Intravenous Every 8 hours 02/10/16 1239 02/13/16 0856   02/09/16 1000  imipenem-cilastatin (PRIMAXIN) 1,000 mg in sodium chloride 0.9 % 250 mL IVPB  Status:  Discontinued     1,000 mg 250 mL/hr over 60 Minutes Intravenous Every 8 hours 02/09/16 0812 02/10/16 1239   02/09/16 1000  vancomycin (VANCOCIN) IVPB 1000 mg/200 mL premix  Status:  Discontinued     1,000 mg 200 mL/hr over  60 Minutes Intravenous Every 8 hours 02/09/16 0812 02/10/16 1239   02/09/16 0900  anidulafungin (ERAXIS) 200 mg in sodium chloride 0.9 % 200 mL IVPB     200 mg over 180 Minutes Intravenous  Once 02/09/16 0804 02/09/16 1209   02/09/16 0830  anidulafungin (ERAXIS) 100 mg in sodium chloride 0.9 % 100 mL IVPB  Status:  Discontinued     100 mg over 90 Minutes Intravenous Every 24 hours 02/08/16 0811 02/09/16 0804   02/08/16 1800  vancomycin (VANCOCIN) IVPB 750 mg/150 ml premix  Status:  Discontinued     750 mg 150 mL/hr over 60 Minutes Intravenous Every 8 hours 02/08/16 0847 02/09/16 0812   02/08/16 1400  piperacillin-tazobactam (ZOSYN) IVPB 3.375 g  Status:  Discontinued     3.375 g 12.5 mL/hr over 240 Minutes Intravenous Every 8 hours 02/08/16 0432 02/08/16 0802   02/08/16 0900  imipenem-cilastatin (PRIMAXIN) 500 mg in sodium chloride 0.9 % 100 mL IVPB  Status:  Discontinued     500 mg 200 mL/hr over 30 Minutes Intravenous Every 6 hours 02/08/16 0818  02/09/16 0812   02/08/16 0830  ertapenem (INVANZ) 1 g in sodium chloride 0.9 % 50 mL IVPB  Status:  Discontinued     1 g 100 mL/hr over 30 Minutes Intravenous Every 24 hours 02/08/16 0756 02/08/16 0812   02/08/16 0830  anidulafungin (ERAXIS) 200 mg in sodium chloride 0.9 % 200 mL IVPB  Status:  Discontinued     200 mg over 180 Minutes Intravenous  Once 02/08/16 0811 02/09/16 1457   02/08/16 0830  vancomycin (VANCOCIN) 1,500 mg in sodium chloride 0.9 % 500 mL IVPB  Status:  Discontinued     1,500 mg 250 mL/hr over 120 Minutes Intravenous  Once 02/08/16 0815 02/09/16 1355   02/08/16 0815  vancomycin (VANCOCIN) 1,500 mg in sodium chloride 0.9 % 500 mL IVPB  Status:  Discontinued     1,500 mg 250 mL/hr over 120 Minutes Intravenous Every 12 hours 02/08/16 0811 02/08/16 0812   02/08/16 0430  piperacillin-tazobactam (ZOSYN) IVPB 3.375 g     3.375 g 100 mL/hr over 30 Minutes Intravenous  Once 02/08/16 0419 02/08/16 0532      Subjective:   [name] seen and examined today.  Patient feels well this morning.  No new complaints. Feels output is improving and slowing down. Denies chest pain, shortness of breath, nausea, vomiting, dizziness, headache.   Objective:   Vitals:   02/29/16 2145 02/29/16 2345 03/01/16 0300 03/01/16 0514  BP: (!) 100/59 (!) 105/56  (!) 110/56  Pulse: 80 79  75  Resp: 18 18  18   Temp: 99 F (37.2 C) (!) 100.4 F (38 C) 99.2 F (37.3 C) 98.3 F (36.8 C)  TempSrc: Oral Oral Oral Oral  SpO2: 99%   99%  Weight:      Height:        Intake/Output Summary (Last 24 hours) at 03/01/16 1210 Last data filed at 03/01/16 0858  Gross per 24 hour  Intake              935 ml  Output             6250 ml  Net            -5315 ml   Filed Weights   02/13/16 0350 02/14/16 0400 02/16/16 0400  Weight: 82.6 kg (182 lb 1.6 oz) 72.9 kg (160 lb 11.5 oz) 55.9 kg (123 lb 3.8 oz)    Exam  General: Well developed, well nourished, NAD  HEENT: NCAT,mucous membranes moist.   Cardiovascular: S1 S2 auscultated, no murmurs, RRR  Respiratory: Clear to auscultation bilaterally with equal chest rise  Abdomen: Soft, nontender, nondistended, + bowel sounds, + ileostomy, incision site clean with no drainage  Extremities: warm dry without cyanosis clubbing or edema  Neuro: AAOx3, nonfocal   Psych: Normal affect and demeanor, pleasant   Data Reviewed: I have personally reviewed following labs and imaging studies  CBC:  Recent Labs Lab 02/25/16 0450 02/26/16 0500 02/27/16 0440 02/29/16 0500 03/01/16 0609  WBC 25.6* 14.5* 10.0 8.2 8.9  NEUTROABS  --  10.2* 7.1 4.9  --   HGB 8.6* 7.1* 7.7* 6.5* 8.4*  HCT 26.3* 21.0* 23.5* 20.1* 25.6*  MCV 83.5 83.3 83.6 85.2 83.7  PLT 329 280 355 213 0000000   Basic Metabolic Panel:  Recent Labs Lab 02/26/16 0500 02/27/16 0440 02/28/16 0500 02/29/16 0500 03/01/16 0609  NA 131* 134* 135 138 137  K 2.9* 3.2* 2.8* 3.3* 3.3*  CL 100* 102 104 109 107  CO2 24  25 24 23 24     GLUCOSE 93 110* 118* 344* 244*  BUN 5* <5* <5* <5* <5*  CREATININE 0.59* 0.55* 0.51* 0.45* 0.49*  CALCIUM 7.4* 7.9* 7.7* 7.2* 7.7*  MG 2.1 1.9 1.6* 1.7 1.7  PHOS 2.8 3.0 3.0 3.2 3.7   GFR: Estimated Creatinine Clearance: 88.3 mL/min (by C-G formula based on SCr of 0.49 mg/dL (L)). Liver Function Tests: No results for input(s): AST, ALT, ALKPHOS, BILITOT, PROT, ALBUMIN in the last 168 hours. No results for input(s): LIPASE, AMYLASE in the last 168 hours. No results for input(s): AMMONIA in the last 168 hours. Coagulation Profile: No results for input(s): INR, PROTIME in the last 168 hours. Cardiac Enzymes: No results for input(s): CKTOTAL, CKMB, CKMBINDEX, TROPONINI in the last 168 hours. BNP (last 3 results) No results for input(s): PROBNP in the last 8760 hours. HbA1C: No results for input(s): HGBA1C in the last 72 hours. CBG: No results for input(s): GLUCAP in the last 168 hours. Lipid Profile: No results for input(s): CHOL, HDL, LDLCALC, TRIG, CHOLHDL, LDLDIRECT in the last 72 hours. Thyroid Function Tests: No results for input(s): TSH, T4TOTAL, FREET4, T3FREE, THYROIDAB in the last 72 hours. Anemia Panel: No results for input(s): VITAMINB12, FOLATE, FERRITIN, TIBC, IRON, RETICCTPCT in the last 72 hours. Urine analysis:    Component Value Date/Time   COLORURINE YELLOW 02/29/2016 [place] 02/29/2016 1526   LABSPEC 1.009 02/29/2016 1526   PHURINE 6.5 02/29/2016 1526   GLUCOSEU NEGATIVE 02/29/2016 1526   HGBUR NEGATIVE 02/29/2016 [place] 02/29/2016 [place] 02/29/2016 [place] 02/29/2016 1526   UROBILINOGEN 1.0 07/28/2007 1539   NITRITE NEGATIVE 02/29/2016 [place] 02/29/2016 1526   Sepsis Labs: @LABRCNTIP (procalcitonin:4,lacticidven:4)  ) Recent Results (from the past 240 hour(s))  Urine culture     Status: None   Collection Time: 02/25/16  1:43 PM  Result Value Ref Range  Status   Specimen Description URINE, RANDOM  Final   Special Requests NONE  Final   Culture NO GROWTH  Final   Report Status 02/26/2016 FINAL  Final      Radiology Studies: Dg Chest 2 View  Result Date: 02/29/2016 CLINICAL DATA:  Fever. EXAM: CHEST  2 VIEW COMPARISON:  02/15/2016 FINDINGS: Again noted are patchy densities at the left lung base. Overall, the aeration at the lung bases has improved. Surgical hardware in the lower thoracic spine. PICC line tip is near the superior cavoatrial based on the lateral view. Heart size is within normal limits. The trachea is midline. Cannot exclude trace left pleural fluid. IMPRESSION: Improved aeration at the lung bases with residual patchy densities at left lung base. These densities may represent areas of atelectasis. Electronically Signed   By: Markus Daft M.D.   On: 02/29/2016 08:41     Scheduled Meds: . sodium chloride   Intravenous Once  . amoxicillin-clavulanate  1 tablet Oral Q12H  . diphenoxylate-atropine  2 tablet Oral QID  . feeding supplement (ENSURE ENLIVE)  237 mL Oral BID BM  . ferrous sulfate  325 mg Oral TID WC  . loperamide  8 mg Oral QID  . loratadine  10 mg Oral Daily  . magnesium oxide  400 mg Oral TID  . oxymorphone  40 mg Oral TID  . pantoprazole  40 mg Oral Daily  . potassium & sodium phosphates  1 packet Oral QID  . psyllium  2 packet Oral TID  Continuous Infusions: . dextrose 5 % and 0.9% NaCl 125 mL/hr at 03/01/16 0445     LOS: 22 days   Time Spent in minutes   30 minutes  Cherisa Brucker D.O. on 03/01/2016 at 12:10 PM  Between 7am to 7pm - Pager - [phone]  After 7pm go to [email or web address] - password [email or web address]  And look for the night coverage person covering for me after hours  [facility] Office  [phone]

## 2016-03-01 NOTE — Progress Notes (Signed)
Patient ID: [name], male   DOB: 11/04/1965, 50 y.o.   MRN: [id number]  [facility] Surgery Progress Note  18 Days Post-Op  Subjective: Increased back pain this morning, but denies any abdominal pain. Denies any new symptoms including cough, CP, or rhinorrhea. Spiked temp of 100.4 over night.  Objective: Vital signs in last 24 hours: Temp:  [98.3 F (36.8 C)-101.3 F (38.5 C)] 98.3 F (36.8 C) (11/16 0514) Pulse Rate:  [72-89] 75 (11/16 0514) Resp:  [16-20] 18 (11/16 0514) BP: (100-110)/(52-62) 110/56 (11/16 0514) SpO2:  [99 %-100 %] 99 % (11/16 0514) Last BM Date: 02/29/16  Intake/Output from previous day: 11/15 0701 - 11/16 0700 In: [id number] [P.O.:600; Blood:335] Out: 5275 [Urine:3325; Stool:1950] Intake/Output this shift: No intake/output data recorded.  PE: Gen: Alert, NAD Card: RRR Pulm: CTAB Abd: Soft, NT/ND, +BS, midline incision clean and dry with only a small area at distal aspect minimally open and trace drainage, ileostomy functioning withliquid brown stool in bag   Lab Results:   Recent Labs  02/29/16 0500 03/01/16 0609  WBC 8.2 8.9  HGB 6.5* 8.4*  HCT 20.1* 25.6*  PLT 213 324   BMET  Recent Labs  02/29/16 0500 03/01/16 0609  NA 138 137  K 3.3* 3.3*  CL 109 107  CO2 23 24  GLUCOSE 344* 244*  BUN <5* <5*  CREATININE 0.45* 0.49*  CALCIUM 7.2* 7.7*   PT/INR No results for input(s): LABPROT, INR in the last 72 hours. CMP     Component Value Date/Time   NA 137 03/01/2016 0609   K 3.3 (L) 03/01/2016 0609   CL 107 03/01/2016 0609   CO2 24 03/01/2016 0609   GLUCOSE 244 (H) 03/01/2016 0609   BUN <5 (L) 03/01/2016 0609   CREATININE 0.49 (L) 03/01/2016 0609   CALCIUM 7.7 (L) 03/01/2016 0609   PROT 5.1 (L) 02/20/2016 0534   ALBUMIN 1.6 (L) 02/20/2016 0534   AST 21 02/20/2016 0534   ALT 18 02/20/2016 0534   ALKPHOS 127 (H) 02/20/2016 0534   BILITOT 0.6 02/20/2016 0534   GFRNONAA >60 03/01/2016 0609   GFRAA >60 03/01/2016 0609    Lipase     Component Value Date/Time   LIPASE 16 02/08/2016 0412       Studies/Results: Dg Chest 2 View  Result Date: 02/29/2016 CLINICAL DATA:  Fever. EXAM: CHEST  2 VIEW COMPARISON:  02/15/2016 FINDINGS: Again noted are patchy densities at the left lung base. Overall, the aeration at the lung bases has improved. Surgical hardware in the lower thoracic spine. PICC line tip is near the superior cavoatrial based on the lateral view. Heart size is within normal limits. The trachea is midline. Cannot exclude trace left pleural fluid. IMPRESSION: Improved aeration at the lung bases with residual patchy densities at left lung base. These densities may represent areas of atelectasis. Electronically Signed   By: Markus Daft M.D.   On: 02/29/2016 08:41    Anti-infectives: Anti-infectives    Start     Dose/Rate Route Frequency Ordered Stop   02/27/16 1345  amoxicillin-clavulanate (AUGMENTIN) 875-125 MG per tablet 1 tablet     1 tablet Oral Every 12 hours 02/27/16 1337 03/05/16 0959   02/26/16 1400  piperacillin-tazobactam (ZOSYN) IVPB 3.375 g  Status:  Discontinued     3.375 g 12.5 mL/hr over 240 Minutes Intravenous Every 8 hours 02/26/16 1059 02/27/16 1337   02/26/16 1045  metroNIDAZOLE (FLAGYL) IVPB 500 mg  Status:  Discontinued  500 mg 100 mL/hr over 60 Minutes Intravenous Every 8 hours 02/26/16 1038 02/26/16 1100   02/18/16 1400  tobramycin (NEBCIN) 490 mg in dextrose 5 % 100 mL IVPB     7 mg/kg  70.7 kg (Ideal) 112.3 mL/hr over 60 Minutes Intravenous Every 24 hours 02/18/16 1041 02/20/16 1453   02/15/16 0031  tigecycline (TYGACIL) 50 mg in sodium chloride 0.9 % 100 mL IVPB     50 mg 200 mL/hr over 30 Minutes Intravenous Every 12 hours 02/14/16 1232 02/20/16 1325   02/14/16 1400  tobramycin (NEBCIN) 510 mg in dextrose 5 % 100 mL IVPB  Status:  Discontinued     7 mg/kg  72.9 kg 112.8 mL/hr over 60 Minutes Intravenous Every 24 hours 02/14/16 1241 02/18/16 1041   02/14/16 1245   tigecycline (TYGACIL) 100 mg in sodium chloride 0.9 % 100 mL IVPB     100 mg 200 mL/hr over 30 Minutes Intravenous  Once 02/14/16 1232 02/14/16 1342   02/13/16 1200  cefTRIAXone (ROCEPHIN) 2 g in dextrose 5 % 50 mL IVPB  Status:  Discontinued     2 g 100 mL/hr over 30 Minutes Intravenous Every 24 hours 02/13/16 0857 02/14/16 1205   02/13/16 1200  tobramycin (NEBCIN) 530 mg in dextrose 5 % 100 mL IVPB  Status:  Discontinued     7 mg/kg  75.5 kg (Adjusted) 113.3 mL/hr over 60 Minutes Intravenous Every 24 hours 02/13/16 1123 02/14/16 1234   02/10/16 1500  anidulafungin (ERAXIS) 100 mg in sodium chloride 0.9 % 100 mL IVPB     100 mg over 90 Minutes Intravenous Every 24 hours 02/09/16 0804 02/15/16 1556   02/10/16 1500  vancomycin (VANCOCIN) IVPB 1000 mg/200 mL premix  Status:  Discontinued     1,000 mg 200 mL/hr over 60 Minutes Intravenous Every 8 hours 02/10/16 1239 02/13/16 0856   02/10/16 1500  imipenem-cilastatin (PRIMAXIN) 1,000 mg in sodium chloride 0.9 % 250 mL IVPB  Status:  Discontinued     1,000 mg 250 mL/hr over 60 Minutes Intravenous Every 8 hours 02/10/16 1239 02/13/16 0856   02/09/16 1000  imipenem-cilastatin (PRIMAXIN) 1,000 mg in sodium chloride 0.9 % 250 mL IVPB  Status:  Discontinued     1,000 mg 250 mL/hr over 60 Minutes Intravenous Every 8 hours 02/09/16 0812 02/10/16 1239   02/09/16 1000  vancomycin (VANCOCIN) IVPB 1000 mg/200 mL premix  Status:  Discontinued     1,000 mg 200 mL/hr over 60 Minutes Intravenous Every 8 hours 02/09/16 0812 02/10/16 1239   02/09/16 0900  anidulafungin (ERAXIS) 200 mg in sodium chloride 0.9 % 200 mL IVPB     200 mg over 180 Minutes Intravenous  Once 02/09/16 0804 02/09/16 1209   02/09/16 0830  anidulafungin (ERAXIS) 100 mg in sodium chloride 0.9 % 100 mL IVPB  Status:  Discontinued     100 mg over 90 Minutes Intravenous Every 24 hours 02/08/16 0811 02/09/16 0804   02/08/16 1800  vancomycin (VANCOCIN) IVPB 750 mg/150 ml premix  Status:   Discontinued     750 mg 150 mL/hr over 60 Minutes Intravenous Every 8 hours 02/08/16 0847 02/09/16 0812   02/08/16 1400  piperacillin-tazobactam (ZOSYN) IVPB 3.375 g  Status:  Discontinued     3.375 g 12.5 mL/hr over 240 Minutes Intravenous Every 8 hours 02/08/16 0432 02/08/16 0802   02/08/16 0900  imipenem-cilastatin (PRIMAXIN) 500 mg in sodium chloride 0.9 % 100 mL IVPB  Status:  Discontinued     500  mg 200 mL/hr over 30 Minutes Intravenous Every 6 hours 02/08/16 0818 02/09/16 0812   02/08/16 0830  ertapenem (INVANZ) 1 g in sodium chloride 0.9 % 50 mL IVPB  Status:  Discontinued     1 g 100 mL/hr over 30 Minutes Intravenous Every 24 hours 02/08/16 0756 02/08/16 0812   02/08/16 0830  anidulafungin (ERAXIS) 200 mg in sodium chloride 0.9 % 200 mL IVPB  Status:  Discontinued     200 mg over 180 Minutes Intravenous  Once 02/08/16 0811 02/09/16 1457   02/08/16 0830  vancomycin (VANCOCIN) 1,500 mg in sodium chloride 0.9 % 500 mL IVPB  Status:  Discontinued     1,500 mg 250 mL/hr over 120 Minutes Intravenous  Once 02/08/16 0815 02/09/16 1355   02/08/16 0815  vancomycin (VANCOCIN) 1,500 mg in sodium chloride 0.9 % 500 mL IVPB  Status:  Discontinued     1,500 mg 250 mL/hr over 120 Minutes Intravenous Every 12 hours 02/08/16 0811 02/08/16 0812   02/08/16 0430  piperacillin-tazobactam (ZOSYN) IVPB 3.375 g     3.375 g 100 mL/hr over 30 Minutes Intravenous  Once 02/08/16 0419 02/08/16 0532       Assessment/Plan Crohns disease s/p ileocecectomy 01/27/16 for SB perforation Anastomotic leak/ peritoneal abscess - s/p exp lap/ resection of anastomosis 02/08/16 Washout/ VAC change 02/10/16 Washout/ fascial closure 02/12/16 - 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 Continue immodium 8mg  QID, metamucil, and lomotil.  - CT 11/11 showed multiple small fluid collections. Patient was on zosyn x2 days then switched to augmentin 11/13 - WBC this AM WNL 8.9, fever again over night of 100.4. CXR ok, UA  OK, urine cx pending. Exam benign.  Magnesium and Phosphorous WNL today, continued hypokalemia - repleting Anemia - hg up to 8.4 today after 1 uPRBC yesterday Chronic pain syndrome - on home meds (opana 40mg  TID), also still requiring some additional dilaudid  ID - zosyn 11/12>>11/13. Augmentin 11/13 (day #4/8)>> VTE - SCDs FEN - dysphagia 2 diet, Ensure  Plan: replete K. fever of unknown origin, CXR and UA WNL and urine cx pending. Appreciate medicine recommendations for any further workup of fever. Encouraged patient to use IS more today (has only been using it BID). continue augmentin BID (day #4/8). Ileostomy output down to 1700/24hr. Continue wet to dry dressings to midline wound.     LOS: 22 days    Jerrye Beavers , [facility] Surgery 03/01/2016, 7:32 AM Pager: [phone] Consults: [phone] Mon-Fri 7:00 am-4:30 pm Sat-Sun 7:00 am-11:30 am

## 2016-03-01 NOTE — Progress Notes (Signed)
Inpatient Diabetes Program Recommendations  AACE/ADA: New Consensus Statement on Inpatient Glycemic Control (2015)  Target Ranges:  Prepandial:   less than 140 mg/dL      Peak postprandial:   less than 180 mg/dL (1-2 hours)      Critically ill patients:  140 - 180 mg/dL   Review of Glycemic Control  Diabetes history: No hx DM.  Inpatient Diabetes Program Recommendations:  Noted last 2 lab glucose results are elevated-344 & 244. Please consider CBGs ac & hs to evaluate glycemic control.  Thank you, [name]. Roger Lymon, RN, MSN, CDE Inpatient Glycemic Control Team Team Pager [phone] (8am-5pm) 03/01/2016 11:56 AM

## 2016-03-02 LAB — CBC
HEMATOCRIT: 29.6 % — AB (ref 39.0–52.0)
HEMOGLOBIN: 9.9 g/dL — AB (ref 13.0–17.0)
MCH: 28 pg (ref 26.0–34.0)
MCHC: 33.4 g/dL (ref 30.0–36.0)
MCV: 83.9 fL (ref 78.0–100.0)
Platelets: 360 10*3/uL (ref 150–400)
RBC: 3.53 MIL/uL — ABNORMAL LOW (ref 4.22–5.81)
RDW: 16.7 % — AB (ref 11.5–15.5)
WBC: 9.4 10*3/uL (ref 4.0–10.5)

## 2016-03-02 LAB — BASIC METABOLIC PANEL
Anion gap: 8 (ref 5–15)
BUN: 5 mg/dL — AB (ref 6–20)
CHLORIDE: 101 mmol/L (ref 101–111)
CO2: 25 mmol/L (ref 22–32)
Calcium: 8.2 mg/dL — ABNORMAL LOW (ref 8.9–10.3)
Creatinine, Ser: 0.5 mg/dL — ABNORMAL LOW (ref 0.61–1.24)
GFR calc Af Amer: >60 mL/min (ref >60)
GLUCOSE: 81 mg/dL (ref 65–99)
POTASSIUM: 3.2 mmol/L — AB (ref 3.5–5.1)
Sodium: 134 mmol/L — ABNORMAL LOW (ref 135–145)

## 2016-03-02 LAB — C DIFFICILE QUICK SCREEN W PCR REFLEX
C Diff antigen: NEGATIVE
C Diff interpretation: NOT DETECTED
C Diff toxin: NEGATIVE

## 2016-03-02 LAB — MAGNESIUM: Magnesium: 1.8 mg/dL (ref 1.7–2.4)

## 2016-03-02 LAB — PHOSPHORUS: PHOSPHORUS: 4 mg/dL (ref 2.5–4.6)

## 2016-03-02 MED ORDER — PAREGORIC 2 MG/5ML PO TINC
5.0000 mL | ORAL | Status: DC | PRN
  Administered 2016-03-02: 5 mL via ORAL
  Filled 2016-03-02 (×2): qty 5

## 2016-03-02 NOTE — Progress Notes (Signed)
19 Days Post-Op  Subjective: LOMOTIL 2 mg qid Imodium 8 mg qid Dilaudid 0.5 mg  X 5 yesterday Stopping magnesium and Augmentin Opana ER 40 mg TID Protonix 40 qd K+ and phsophate PO qid Metamucil 2 packs, BID  Stool more jello like today that is new.  No other complaints, wound looks good, ostomy working well. Tolerating diet, but wants dysphasia consistency because he has no teeth Objective: Vital signs in last 24 hours: Temp:  [98.2 F (36.8 C)-100.2 F (37.9 C)] 98.2 F (36.8 C) (11/17 0603) Pulse Rate:  [70-91] 70 (11/17 0603) Resp:  [16-18] 18 (11/17 0603) BP: (107-122)/(57-77) 109/61 (11/17 0603) SpO2:  [97 %-100 %] 100 % (11/17 0603) Last BM Date: 03/02/16 PO 1000 IV 830 Urine 2100 Ileostomy 3425 Tm 100.2, VSS WBC 9.4, H/H up to 9.9/29.6 Mag 1.8 - K+ 3.2 CT scan 02/25/16: 1. Persistent diffuse peritonitis. Two small peritoneal abscesses as described in the posterior right lower quadrant and central pelvis. 2. New small low attenuation subcapsular collection in the peripheral upper spleen, possibly a subcapsular splenic abscess or infarct. 3. Mild growth of a 2.1 cm low-attenuation segment 3 left liver lobe mass, which could represent an hepatic abscess. 4. Additional low-attenuation liver lesions are stable and indeterminate and warrant outpatient evaluation with MRI abdomen without and with IV contrast when feasible. 5. No evidence of small bowel obstruction. Diffuse small and large bowel wall thickening probably due to peritonitis, with no specific findings of bowel ischemia (no pneumoperitoneum and no appreciable pneumatosis). Intake/Output from previous day: 11/16 0701 - 11/17 0700 In: 1830 [P.O.:1000; I.V.:830] Out: [id number] [Urine:2100; [id number] Intake/Output this shift: No intake/output data recorded.  General appearance: alert, cooperative and no distress GI: soft, sore, midline incision looks good.    Lab Results:   Recent Labs  03/01/16 0609  03/02/16 0903  WBC 8.9 9.4  HGB 8.4* 9.9*  HCT 25.6* 29.6*  PLT 324 360    BMET  Recent Labs  03/01/16 0609 03/02/16 0515  NA 137 134*  K 3.3* 3.2*  CL 107 101  CO2 24 25  GLUCOSE 244* 81  BUN <5* 5*  CREATININE 0.49* 0.50*  CALCIUM 7.7* 8.2*   PT/INR No results for input(s): LABPROT, INR in the last 72 hours.  No results for input(s): AST, ALT, ALKPHOS, BILITOT, PROT, ALBUMIN in the last 168 hours.   Lipase     Component Value Date/Time   LIPASE 16 02/08/2016 0412     Studies/Results: No results found. Prior to Admission medications   Medication Sig Start Date End Date Taking? Authorizing Provider  ALPRAZolam Duanne Moron) 0.5 MG tablet Take 0.5 mg by mouth 3 (three) times daily as needed for anxiety.    Yes Historical Provider, MD  Diphenhydramine-APAP, sleep, (GOODY PM PO) Take 1 Package by mouth daily as needed (sleep/pain).   Yes Historical Provider, MD  oxymorphone (OPANA ER) 40 MG 12 hr tablet Take 40 mg by mouth 3 (three) times daily.   Yes Historical Provider, MD      Medications: . amoxicillin-clavulanate  1 tablet Oral Q12H  . diphenoxylate-atropine  2 tablet Oral QID  . feeding supplement (ENSURE ENLIVE)  237 mL Oral BID BM  . ferrous sulfate  325 mg Oral TID WC  . loperamide  8 mg Oral QID  . loratadine  10 mg Oral Daily  . magnesium oxide  400 mg Oral TID  . oxymorphone  40 mg Oral TID  . pantoprazole  40 mg Oral Daily  .  potassium & sodium phosphates  1 packet Oral QID  . psyllium  2 packet Oral TID    Assessment/Plan Crohns disease s/p ileocecectomy 01/27/16 for SB perforation Anastomotic leak/ peritoneal abscess - s/p exp lap/ resection of anastomosis 02/08/16 Washout/ VAC change 02/10/16 Washout/ fascial closure 02/12/16 - Ileostomy with increased output 3425cc/24hr. Continue immodium 8mg  QID, metamucil, and lomotil.  - CT 11/11 showed multiple small fluid collections. Patient was on zosyn x2 days then switched to augmentin 11/13 - WBC  this AM WNL 8.9, fever again over night of 100.4. CXR ok, UA OK, urine cx pending. Exam benign.  Magnesium and Phosphorous WNL today, continued hypokalemia - repleting Anemia - hg up to 8.4 today after 1 uPRBC yesterday Chronic pain syndrome dating back to 2009 for back surgeries- on home meds (opana 40mg  TID) , xanax PRN;  also still requiring some additional dilaudid ID - zosyn 11/12>>11/13. Augmentin 11/13 (day #5/8)>>  Plan to stop Augmentin today secondary to high output VTE - SCDs FEN - Regular diet/Ensure   Plan:  Stop magnesium, and Augmentin, ask GI to see and any further recommendations on ileostomy output control.       LOS: 23 days    Alyxandria Wentz 03/02/2016 [phone]

## 2016-03-02 NOTE — Progress Notes (Addendum)
PROGRESS NOTE    MOIZ BIRMAN  [id number] DOB: [birth year] DOA: 02/08/2016 PCP: No primary care provider on file.   Chief Complaint  Patient presents with  . Abdominal Pain    Brief Narrative:  HPI on 02/08/2016 by Ms. [name], PA (Surgery) 50 y/o male with PMH back pain and small bowel perforation s/p ileocecectomy 01/28/16 who presented to [facility] with abdominal pain. He was previously discharged from hospital on 02/02/16. Patient states his abdominal pain started late last night/early this morning and is described as sharp and non-radiating. Rates pain as 10/10. Pain is worse with movement and inspiration. No relief with PO pain medication. Associated symptoms include nausea, chills, and SOB. Patient has been having daily BMs. Denies fever, CP, vomiting, diarrhea, melena, hematochezia, urinary symptoms, and weakness. He denies regular medication use.  Interim history  He required re-admit as he developed septic shock, ischemic bowel, and an abscess. He was taken to the OR on 02/08/16 for abscess drainage, and ileocecum removal. Patient continues to have high output, per sugery, once ouptut  <1L, can be discharged. Also found to have anemia, received 1uPRBC, hemoglobin currently stable.  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 Patient currently on Augmentin (started on 02/27/2016- stopped on 11/17) -GI consulted and appreciated (by general surgery) -Will order Cdiff (although ?small  bowel), although patient does not appear toxic -Will try paregoric (if patient agrees)  Fever -Spiked fever of 101.2 on 11/15 -has been afebrile for 24 hours -CXR unremarkable for infection  -Urine culture from 11/11 shows no growth -Patient on antibiotics  Normocytic Anemia -hemoglobin dropped again, currently 9.9 (given 1uPRBC on 11/15) -Continue to monitor CBC -Anemia panel shows Iron 11, ferritin 257  -Given dose of feraheme -Discontinue Lovenox- placed on SCDs  L Pleural Effusion  -Small on f/u CXR -follow w/o intervention for now  -No complaints of shortness of breath  Septic shock -Resolved  Lower extremity edema -resolving  Hyopkalemia/hypomagnesemia -Replacing, continue to monitor  Hyponatremia -On NS -Resolved -Continue to monitor  AKI -Resolved  Chronic pain syndrome -Continue pain control PRN  DVT Prophylaxis  SCDs  Code Status: Full  Family Communication: None at bedside  Disposition Plan: Admitted. Discharge pending surgery recommendations (output <1L)  Consultants PCCM General surgery Infectious disease  Procedures  10/25 - readmitted - Intubated and central access obtained - Exploratory surgery, wound vac placement and ileocectomy  10/27 - Exploratory surgery-peritonitis, distal ileum ischemia , s/p resection w/ ileostomy  10/29 - Exploratory laparotomy with removal of vacuum pack dressing and closure of abdominal wall  10/30 - Extubated 11/1 - [facility] assumed care  Antibiotics   Anti-infectives    Start     Dose/Rate Route Frequency Ordered Stop   02/27/16 1345  amoxicillin-clavulanate (AUGMENTIN) 875-125 MG per tablet 1 tablet  Status:  Discontinued     1 tablet Oral Every 12 hours 02/27/16 1337 03/02/16 1307   02/26/16 1400  piperacillin-tazobactam (ZOSYN) IVPB 3.375 g  Status:  Discontinued     3.375 g 12.5 mL/hr over 240 Minutes Intravenous Every 8 hours 02/26/16 1059  02/27/16 1337   02/26/16 1045  metroNIDAZOLE (FLAGYL) IVPB  500 mg  Status:  Discontinued     500 mg 100 mL/hr over 60 Minutes Intravenous Every 8 hours 02/26/16 1038 02/26/16 1100   02/18/16 1400  tobramycin (NEBCIN) 490 mg in dextrose 5 % 100 mL IVPB     7 mg/kg  70.7 kg (Ideal) 112.3 mL/hr over 60 Minutes Intravenous Every 24 hours 02/18/16 1041 02/20/16 1453   02/15/16 0031  tigecycline (TYGACIL) 50 mg in sodium chloride 0.9 % 100 mL IVPB     50 mg 200 mL/hr over 30 Minutes Intravenous Every 12 hours 02/14/16 1232 02/20/16 1325   02/14/16 1400  tobramycin (NEBCIN) 510 mg in dextrose 5 % 100 mL IVPB  Status:  Discontinued     7 mg/kg  72.9 kg 112.8 mL/hr over 60 Minutes Intravenous Every 24 hours 02/14/16 1241 02/18/16 1041   02/14/16 1245  tigecycline (TYGACIL) 100 mg in sodium chloride 0.9 % 100 mL IVPB     100 mg 200 mL/hr over 30 Minutes Intravenous  Once 02/14/16 1232 02/14/16 1342   02/13/16 1200  cefTRIAXone (ROCEPHIN) 2 g in dextrose 5 % 50 mL IVPB  Status:  Discontinued     2 g 100 mL/hr over 30 Minutes Intravenous Every 24 hours 02/13/16 0857 02/14/16 1205   02/13/16 1200  tobramycin (NEBCIN) 530 mg in dextrose 5 % 100 mL IVPB  Status:  Discontinued     7 mg/kg  75.5 kg (Adjusted) 113.3 mL/hr over 60 Minutes Intravenous Every 24 hours 02/13/16 1123 02/14/16 1234   02/10/16 1500  anidulafungin (ERAXIS) 100 mg in sodium chloride 0.9 % 100 mL IVPB     100 mg over 90 Minutes Intravenous Every 24 hours 02/09/16 0804 02/15/16 1556   02/10/16 1500  vancomycin (VANCOCIN) IVPB 1000 mg/200 mL premix  Status:  Discontinued     1,000 mg 200 mL/hr over 60 Minutes Intravenous Every 8 hours 02/10/16 1239 02/13/16 0856   02/10/16 1500  imipenem-cilastatin (PRIMAXIN) 1,000 mg in sodium chloride 0.9 % 250 mL IVPB  Status:  Discontinued     1,000 mg 250 mL/hr over 60 Minutes Intravenous Every 8 hours 02/10/16 1239 02/13/16 0856   02/09/16 1000  imipenem-cilastatin (PRIMAXIN) 1,000 mg in sodium chloride 0.9 % 250 mL IVPB  Status:  Discontinued      1,000 mg 250 mL/hr over 60 Minutes Intravenous Every 8 hours 02/09/16 0812 02/10/16 1239   02/09/16 1000  vancomycin (VANCOCIN) IVPB 1000 mg/200 mL premix  Status:  Discontinued     1,000 mg 200 mL/hr over 60 Minutes Intravenous Every 8 hours 02/09/16 0812 02/10/16 1239   02/09/16 0900  anidulafungin (ERAXIS) 200 mg in sodium chloride 0.9 % 200 mL IVPB     200 mg over 180 Minutes Intravenous  Once 02/09/16 0804 02/09/16 1209   02/09/16 0830  anidulafungin (ERAXIS) 100 mg in sodium chloride 0.9 % 100 mL IVPB  Status:  Discontinued     100 mg over 90 Minutes Intravenous Every 24 hours 02/08/16 0811 02/09/16 0804   02/08/16 1800  vancomycin (VANCOCIN) IVPB 750 mg/150 ml premix  Status:  Discontinued     750 mg 150 mL/hr over 60 Minutes Intravenous Every 8 hours 02/08/16 0847 02/09/16 0812   02/08/16 1400  piperacillin-tazobactam (ZOSYN) IVPB 3.375 g  Status:  Discontinued     3.375 g 12.5 mL/hr over 240 Minutes Intravenous Every 8 hours 02/08/16 0432 02/08/16 0802   02/08/16 0900  imipenem-cilastatin (  PRIMAXIN) 500 mg in sodium chloride 0.9 % 100 mL IVPB  Status:  Discontinued     500 mg 200 mL/hr over 30 Minutes Intravenous Every 6 hours 02/08/16 0818 02/09/16 0812   02/08/16 0830  ertapenem (INVANZ) 1 g in sodium chloride 0.9 % 50 mL IVPB  Status:  Discontinued     1 g 100 mL/hr over 30 Minutes Intravenous Every 24 hours 02/08/16 0756 02/08/16 0812   02/08/16 0830  anidulafungin (ERAXIS) 200 mg in sodium chloride 0.9 % 200 mL IVPB  Status:  Discontinued     200 mg over 180 Minutes Intravenous  Once 02/08/16 0811 02/09/16 1457   02/08/16 0830  vancomycin (VANCOCIN) 1,500 mg in sodium chloride 0.9 % 500 mL IVPB  Status:  Discontinued     1,500 mg 250 mL/hr over 120 Minutes Intravenous  Once 02/08/16 0815 02/09/16 1355   02/08/16 0815  vancomycin (VANCOCIN) 1,500 mg in sodium chloride 0.9 % 500 mL IVPB  Status:  Discontinued     1,500 mg 250 mL/hr over 120 Minutes Intravenous Every 12  hours 02/08/16 0811 02/08/16 0812   02/08/16 0430  piperacillin-tazobactam (ZOSYN) IVPB 3.375 g     3.375 g 100 mL/hr over 30 Minutes Intravenous  Once 02/08/16 0419 02/08/16 0532      Subjective:   Moshe Salisbury seen and examined today.  Patient feels well this morning and would like to take a shower.  No new complaints. Denies chest pain, shortness of breath, nausea, vomiting, dizziness, headache.   Objective:   Vitals:   03/01/16 0514 03/01/16 1525 03/01/16 2136 03/02/16 0603  BP: (!) 110/56 122/77 (!) 107/57 109/61  Pulse: 75 91 79 70  Resp: 18 18 16 18   Temp: 98.3 F (36.8 C) 100.2 F (37.9 C) 98.2 F (36.8 C) 98.2 F (36.8 C)  TempSrc: Oral Oral Oral Oral  SpO2: 99% 99% 97% 100%  Weight:      Height:        Intake/Output Summary (Last 24 hours) at 03/02/16 1406 Last data filed at 03/02/16 1300  Gross per 24 hour  Intake              530 ml  Output             4175 ml  Net            -3645 ml   Filed Weights   02/13/16 0350 02/14/16 0400 02/16/16 0400  Weight: 82.6 kg (182 lb 1.6 oz) 72.9 kg (160 lb 11.5 oz) 55.9 kg (123 lb 3.8 oz)    Exam  General: Well developed, well nourished, NAD  HEENT: NCAT,mucous membranes moist.   Cardiovascular: S1 S2 auscultated, no murmurs, RRR  Respiratory: Clear to auscultation bilaterally with equal chest rise  Abdomen: Soft, nontender, nondistended, + bowel sounds, + ileostomy  Extremities: warm dry without cyanosis clubbing or edema  Neuro: AAOx3, nonfocal   Psych: Normal affect and demeanor, pleasant   Data Reviewed: I have personally reviewed following labs and imaging studies  CBC:  Recent Labs Lab 02/26/16 0500 02/27/16 0440 02/29/16 0500 03/01/16 0609 03/02/16 0903  WBC 14.5* 10.0 8.2 8.9 9.4  NEUTROABS 10.2* 7.1 4.9  --   --   HGB 7.1* 7.7* 6.5* 8.4* 9.9*  HCT 21.0* 23.5* 20.1* 25.6* 29.6*  MCV 83.3 83.6 85.2 83.7 83.9  PLT 280 355 213 324 [id number]   Basic Metabolic Panel:  Recent Labs Lab  02/27/16 0440 02/28/16 0500 02/29/16 0500 03/01/16 0609 03/02/16  0515  NA 134* 135 138 137 134*  K 3.2* 2.8* 3.3* 3.3* 3.2*  CL 102 104 109 107 101  CO2 25 24 23 24 25   GLUCOSE 110* 118* 344* 244* 81  BUN <5* <5* <5* <5* 5*  CREATININE 0.55* 0.51* 0.45* 0.49* 0.50*  CALCIUM 7.9* 7.7* 7.2* 7.7* 8.2*  MG 1.9 1.6* 1.7 1.7 1.8  PHOS 3.0 3.0 3.2 3.7 4.0   GFR: Estimated Creatinine Clearance: 88.3 mL/min (by C-G formula based on SCr of 0.5 mg/dL (L)). Liver Function Tests: No results for input(s): AST, ALT, ALKPHOS, BILITOT, PROT, ALBUMIN in the last 168 hours. No results for input(s): LIPASE, AMYLASE in the last 168 hours. No results for input(s): AMMONIA in the last 168 hours. Coagulation Profile: No results for input(s): INR, PROTIME in the last 168 hours. Cardiac Enzymes: No results for input(s): CKTOTAL, CKMB, CKMBINDEX, TROPONINI in the last 168 hours. BNP (last 3 results) No results for input(s): PROBNP in the last 8760 hours. HbA1C: No results for input(s): HGBA1C in the last 72 hours. CBG: No results for input(s): GLUCAP in the last 168 hours. Lipid Profile: No results for input(s): CHOL, HDL, LDLCALC, TRIG, CHOLHDL, LDLDIRECT in the last 72 hours. Thyroid Function Tests: No results for input(s): TSH, T4TOTAL, FREET4, T3FREE, THYROIDAB in the last 72 hours. Anemia Panel: No results for input(s): VITAMINB12, FOLATE, FERRITIN, TIBC, IRON, RETICCTPCT in the last 72 hours. Urine analysis:    Component Value Date/Time   COLORURINE YELLOW 02/29/2016 [place] 02/29/2016 1526   LABSPEC 1.009 02/29/2016 1526   PHURINE 6.5 02/29/2016 1526   GLUCOSEU NEGATIVE 02/29/2016 1526   HGBUR NEGATIVE 02/29/2016 [place] 02/29/2016 [place] 02/29/2016 [place] 02/29/2016 1526   UROBILINOGEN 1.0 07/28/2007 1539   NITRITE NEGATIVE 02/29/2016 [place] 02/29/2016 1526   Sepsis  Labs: @LABRCNTIP (procalcitonin:4,lacticidven:4)  ) Recent Results (from the past 240 hour(s))  Urine culture     Status: None   Collection Time: 02/25/16  1:43 PM  Result Value Ref Range Status   Specimen Description URINE, RANDOM  Final   Special Requests NONE  Final   Culture NO GROWTH  Final   Report Status 02/26/2016 FINAL  Final  Culture, Urine     Status: None   Collection Time: 02/29/16  3:27 PM  Result Value Ref Range Status   Specimen Description URINE, CLEAN CATCH  Final   Special Requests NONE  Final   Culture NO GROWTH  Final   Report Status 03/01/2016 FINAL  Final      Radiology Studies: No results found.   Scheduled Meds: . diphenoxylate-atropine  2 tablet Oral QID  . feeding supplement (ENSURE ENLIVE)  237 mL Oral BID BM  . ferrous sulfate  325 mg Oral TID WC  . loperamide  8 mg Oral QID  . loratadine  10 mg Oral Daily  . oxymorphone  40 mg Oral TID  . pantoprazole  40 mg Oral Daily  . potassium & sodium phosphates  1 packet Oral QID  . psyllium  2 packet Oral TID   Continuous Infusions:    LOS: 23 days   Time Spent in minutes   30 minutes  Selin Eisler D.O. on 03/02/2016 at 2:06 PM  Between 7am to 7pm - Pager - [phone]  After 7pm go to [email or web address] - password [email or web address]  And look for the night coverage person covering for me after hours  [place]  [phone]

## 2016-03-02 NOTE — Progress Notes (Signed)
Nutrition Follow-up  DOCUMENTATION CODES:   Severe malnutrition in context of acute illness/injury  INTERVENTION:   - Continue Ensure Enlive oral nutrition supplement BID. Each provides 350 kcal and 20 grams protein. Pt prefers vanilla flavor. - Provided pt with "High-protein, High-calorie Nutrition Therapy" handout from the Nutrition Care Manual. Discussed with pt. - Received consult from MD to downgrade diet to Dysphagia 2 due to pt preference and previous SLP recommendation.  NUTRITION DIAGNOSIS:   Malnutrition (Severe) related to acute illness (recent SB perforation) as evidenced by severe depletion of muscle mass, severe fluid accumulation, severe depletion of body fat.  Ongoing.  GOAL:   Patient will meet greater than or equal to 90% of their needs  Met.  MONITOR:   PO intake, Supplement acceptance, Diet advancement, Labs, Weight trends, Skin, I & O's  ASSESSMENT:   Pt with a past medical hx of small bowel perforation on 01/27/16 s/p resection with subsequent wound dehisence. He was d/c on 02/02/16. Developed septic shock, ischemic bowel, abscess. Pt was taken to the OR on 02/08/16 for exploratory surgery, wound vac placement and ileocectomy with open abdomen.   Per MD notes, pt still experiencing high output in ileostomy; plan for pt to be discharged once output < 1 L/day.  Spoke with pt at bedside who reports having a good appetite and eating "everything you bring to me." Pt states he enjoys Ensure Enlive oral nutrition supplement but will only drink vanilla flavor. Per meal flowsheet, pt consuming 75-100% of most meals.  Pt requested education regarding high-protein, high-calorie foods. Provided pt with handout and discussed ways to increase protein and calories in the diet. Pt states he enjoys eggs, cheese, and sausage and has some Ensure at home that he plans to use in milkshakes and smoothies. Pt very appreciate of education and handout.  Medications reviewed and  include 325 mg ferrous sulfate TID, 8 mg Imodium QID, 400 mg magnesium oxide TID, 40 mg Protonix daily, 1 packet Phos-nak daily, PRN Zofran  Labs reviewed and include low sodium (134 mmol/L), low potassium (3.2 mmol/L), low BUN (5 mg/dL), low creatinine (0.50 mg/dL), low calcium (8.2 mg/dL), low hemoglobin (8.4 mg/dL)  Diet Order:  Diet regular Room service appropriate? Yes; Fluid consistency: Thin  Skin:  Wound (see comment) (abdominal incision)  Last BM:  03/01/16  Height:   Ht Readings from Last 1 Encounters:  02/08/16 5' 9"  (1.753 m)    Weight:   Wt Readings from Last 1 Encounters:  02/16/16 123 lb 3.8 oz (55.9 kg)    Ideal Body Weight:  72.7 kg  BMI:  Body mass index is 18.2 kg/m.  Estimated Nutritional Needs:   Kcal:  2100-2300  Protein:  120-140 grams  Fluid:  > 2 L/day  EDUCATION NEEDS:   No education needs identified at this time  Jeb Levering Dietetic Intern Pager Number: [phone]

## 2016-03-02 NOTE — Progress Notes (Signed)
Physical Therapy Discharge Patient Details Name: NAS WAFER MRN: [id number] DOB: Feb 02, 1966 Today's Date: 03/02/2016 Time:  -     Patient discharged from PT services secondary to goals met and no further PT needs identified.  Please see latest therapy progress note for current level of functioning and progress toward goals.    Progress and discharge plan discussed with patient and/or caregiver: Per PTA note, pt educated on discharge recommendation and need for AD use for mobility.  GP     Gerarda Gunther, PT, DPT Acute Rehabilitation Services Pager: [phone]  03/02/2016, 7:15 AM

## 2016-03-02 NOTE — Consult Note (Signed)
[place] Nurse ostomy follow up Stoma type/location: RLQ, end ileostomy Stomal assessment/size: 1 1/8" oval shaped, budded, os at center Peristomal assessment: pouch intact today Treatment options for stomal/peristomal skin: using antifungal powder to treat affected skin which is much improved Output; thicker yellow/green Ostomy pouching: 1pc. With 2" barrier ring  Education provided: reviewed intake and output with patient, sent home paper I&O sheets for patient to use at home to keep track of this.  Spoke with wife on the phone about this as well.  Enrolled patient in [place] Start Discharge program: Yes  [place] Nurse will follow along with you for continued support with ostomy teaching and care Fed Ceci [facility] MSN, RN, [place], [place]

## 2016-03-03 ENCOUNTER — Encounter (HOSPITAL_COMMUNITY): Payer: Self-pay | Admitting: Gastroenterology

## 2016-03-03 LAB — BASIC METABOLIC PANEL
Anion gap: 9 (ref 5–15)
BUN: 6 mg/dL (ref 6–20)
CHLORIDE: 101 mmol/L (ref 101–111)
CO2: 26 mmol/L (ref 22–32)
CREATININE: 0.55 mg/dL — AB (ref 0.61–1.24)
Calcium: 8.3 mg/dL — ABNORMAL LOW (ref 8.9–10.3)
GFR calc Af Amer: >60 mL/min (ref >60)
GFR calc non Af Amer: >60 mL/min (ref >60)
Glucose, Bld: 91 mg/dL (ref 65–99)
Potassium: 3.4 mmol/L — ABNORMAL LOW (ref 3.5–5.1)
Sodium: 136 mmol/L (ref 135–145)

## 2016-03-03 LAB — CBC
HEMATOCRIT: 26 % — AB (ref 39.0–52.0)
HEMOGLOBIN: 8.5 g/dL — AB (ref 13.0–17.0)
MCH: 27.3 pg (ref 26.0–34.0)
MCHC: 32.7 g/dL (ref 30.0–36.0)
MCV: 83.6 fL (ref 78.0–100.0)
Platelets: 364 10*3/uL (ref 150–400)
RBC: 3.11 MIL/uL — ABNORMAL LOW (ref 4.22–5.81)
RDW: 16.7 % — AB (ref 11.5–15.5)
WBC: 8.4 10*3/uL (ref 4.0–10.5)

## 2016-03-03 LAB — PHOSPHORUS: Phosphorus: 4.4 mg/dL (ref 2.5–4.6)

## 2016-03-03 LAB — MAGNESIUM: MAGNESIUM: 1.9 mg/dL (ref 1.7–2.4)

## 2016-03-03 MED ORDER — POTASSIUM CHLORIDE CRYS ER 20 MEQ PO TBCR
40.0000 meq | EXTENDED_RELEASE_TABLET | Freq: Once | ORAL | Status: AC
  Administered 2016-03-03: 40 meq via ORAL
  Filled 2016-03-03: qty 2

## 2016-03-03 MED ORDER — PAREGORIC 2 MG/5ML PO TINC
5.0000 mL | Freq: Two times a day (BID) | ORAL | Status: DC
  Administered 2016-03-03 – 2016-03-04 (×3): 5 mL via ORAL
  Filled 2016-03-03 (×2): qty 5

## 2016-03-03 NOTE — Progress Notes (Signed)
PROGRESS NOTE    [name]  [id number] DOB: 1966/03/20 DOA: 02/08/2016 PCP: No primary care provider on file.   Chief Complaint  Patient presents with  . Abdominal Pain    Brief Narrative:  HPI on 02/08/2016 by Ms. [name], PA (Surgery) 50 y/o male with PMH back pain and small bowel perforation s/p ileocecectomy 01/28/16 who presented to [facility] with abdominal pain. He was previously discharged from hospital on 02/02/16. Patient states his abdominal pain started late last night/early this morning and is described as sharp and non-radiating. Rates pain as 10/10. Pain is worse with movement and inspiration. No relief with PO pain medication. Associated symptoms include nausea, chills, and SOB. Patient has been having daily BMs. Denies fever, CP, vomiting, diarrhea, melena, hematochezia, urinary symptoms, and weakness. He denies regular medication use.  Interim history  He required re-admit as he developed septic shock, ischemic bowel, and an abscess. He was taken to the OR on 02/08/16 for abscess drainage, and ileocecum removal. Patient continues to have high output, per sugery, once ouptut  <1L, can be discharged. Also found to have anemia, received 1uPRBC, hemoglobin currently stable.  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 Patient currently on Augmentin (started on 02/27/2016- stopped on 11/17) -GI consulted and appreciated (by general surgery) -Cdiff negative -Continue  paregoric  Fever -Spiked fever of 101.2 on 11/15 -has been afebrile for 48 hours -CXR unremarkable for infection  -Urine culture from 11/11 shows no growth  Normocytic Anemia -hemoglobin dropped again, currently 8.5(given 1uPRBC on 11/15) -Continue to monitor CBC -Anemia panel shows Iron 11, ferritin 257  -Given dose of feraheme -Discontinue Lovenox- placed on SCDs  L Pleural Effusion  -Small on f/u CXR -follow w/o intervention for now  -No complaints of shortness of breath  Septic shock -Resolved  Lower extremity edema -resolving  Hyopkalemia/hypomagnesemia -Replacing, continue to monitor  Hyponatremia -On NS -Resolved -Continue to monitor  AKI -Resolved  Chronic pain syndrome -Continue pain control PRN  DVT Prophylaxis  SCDs  Code Status: Full  Family Communication: None at bedside  Disposition Plan: Admitted. Discharge to home when stable.  Continue to monitor output and hemoglobin.  Consultants PCCM General surgery Infectious disease  Procedures  10/25 - readmitted - Intubated and central access obtained - Exploratory surgery, wound vac placement and ileocectomy  10/27 - Exploratory surgery-peritonitis, distal ileum ischemia , s/p resection w/ ileostomy  10/29 - Exploratory laparotomy with removal of vacuum pack dressing and closure of abdominal wall  10/30 - Extubated 11/1 - [facility] assumed care  Antibiotics   Anti-infectives    Start     Dose/Rate Route Frequency Ordered Stop   02/27/16 1345  amoxicillin-clavulanate (AUGMENTIN) 875-125 MG per tablet 1 tablet  Status:  Discontinued     1 tablet Oral Every 12 hours 02/27/16 1337 03/02/16 1307   02/26/16 1400  piperacillin-tazobactam (ZOSYN) IVPB 3.375 g  Status:  Discontinued     3.375 g 12.5 mL/hr over 240 Minutes Intravenous Every 8 hours 02/26/16 1059 02/27/16 1337   02/26/16 1045  metroNIDAZOLE (FLAGYL) IVPB 500 mg  Status:  Discontinued     500 mg 100 mL/hr over 60 Minutes Intravenous  Every 8 hours 02/26/16 1038 02/26/16 1100   02/18/16 1400  tobramycin (NEBCIN) 490 mg in dextrose 5 % 100 mL IVPB     7 mg/kg  70.7 kg (Ideal) 112.3 mL/hr over 60 Minutes Intravenous Every 24 hours 02/18/16 1041 02/20/16 1453   02/15/16 0031  tigecycline (TYGACIL) 50 mg in sodium chloride 0.9 % 100 mL IVPB     50 mg 200 mL/hr over 30 Minutes Intravenous Every 12 hours 02/14/16 1232 02/20/16 1325   02/14/16 1400  tobramycin (NEBCIN) 510 mg in dextrose 5 % 100 mL IVPB  Status:  Discontinued     7 mg/kg  72.9 kg 112.8 mL/hr over 60 Minutes Intravenous Every 24 hours 02/14/16 1241 02/18/16 1041   02/14/16 1245  tigecycline (TYGACIL) 100 mg in sodium chloride 0.9 % 100 mL IVPB     100 mg 200 mL/hr over 30 Minutes Intravenous  Once 02/14/16 1232 02/14/16 1342   02/13/16 1200  cefTRIAXone (ROCEPHIN) 2 g in dextrose 5 % 50 mL IVPB  Status:  Discontinued     2 g 100 mL/hr over 30 Minutes Intravenous Every 24 hours 02/13/16 0857 02/14/16 1205   02/13/16 1200  tobramycin (NEBCIN) 530 mg in dextrose 5 % 100 mL IVPB  Status:  Discontinued     7 mg/kg  75.5 kg (Adjusted) 113.3 mL/hr over 60 Minutes Intravenous Every 24 hours 02/13/16 1123 02/14/16 1234   02/10/16 1500  anidulafungin (ERAXIS) 100 mg in sodium chloride 0.9 % 100 mL IVPB     100 mg over 90 Minutes Intravenous Every 24 hours 02/09/16 0804 02/15/16 1556   02/10/16 1500  vancomycin (VANCOCIN) IVPB 1000 mg/200 mL premix  Status:  Discontinued     1,000 mg 200 mL/hr over 60 Minutes Intravenous Every 8 hours 02/10/16 1239 02/13/16 0856   02/10/16 1500  imipenem-cilastatin (PRIMAXIN) 1,000 mg in sodium chloride 0.9 % 250 mL IVPB  Status:  Discontinued     1,000 mg 250 mL/hr over 60 Minutes Intravenous Every 8 hours 02/10/16 1239 02/13/16 0856   02/09/16 1000  imipenem-cilastatin (PRIMAXIN) 1,000 mg in sodium chloride 0.9 % 250 mL IVPB  Status:  Discontinued     1,000 mg 250 mL/hr over 60 Minutes Intravenous Every 8 hours 02/09/16 0812  02/10/16 1239   02/09/16 1000  vancomycin (VANCOCIN) IVPB 1000 mg/200 mL premix  Status:  Discontinued     1,000 mg 200 mL/hr over 60 Minutes Intravenous Every 8 hours 02/09/16 0812 02/10/16 1239   02/09/16 0900  anidulafungin (ERAXIS) 200 mg in sodium chloride 0.9 % 200 mL IVPB     200 mg over 180 Minutes Intravenous  Once 02/09/16 0804 02/09/16 1209   02/09/16 0830  anidulafungin (ERAXIS) 100 mg in sodium chloride 0.9 % 100 mL IVPB  Status:  Discontinued     100 mg over 90 Minutes Intravenous Every 24 hours 02/08/16 0811 02/09/16 0804   02/08/16 1800  vancomycin (VANCOCIN) IVPB 750 mg/150 ml premix  Status:  Discontinued     750 mg 150 mL/hr over 60 Minutes Intravenous Every 8 hours 02/08/16 0847 02/09/16 0812   02/08/16 1400  piperacillin-tazobactam (ZOSYN) IVPB 3.375 g  Status:  Discontinued     3.375 g 12.5 mL/hr over 240 Minutes Intravenous Every 8 hours 02/08/16 0432 02/08/16 0802   02/08/16 0900  imipenem-cilastatin (PRIMAXIN) 500 mg in sodium chloride 0.9 % 100 mL IVPB  Status:  Discontinued     500 mg 200 mL/hr over 30 Minutes Intravenous Every 6 hours 02/08/16 0818 02/09/16 0812   02/08/16 0830  ertapenem (INVANZ) 1 g in sodium chloride 0.9 % 50 mL IVPB  Status:  Discontinued     1 g 100 mL/hr over 30 Minutes Intravenous Every 24 hours 02/08/16 0756 02/08/16 0812   02/08/16 0830  anidulafungin (ERAXIS) 200 mg in sodium chloride 0.9 % 200 mL IVPB  Status:  Discontinued     200 mg over 180 Minutes Intravenous  Once 02/08/16 0811 02/09/16 1457   02/08/16 0830  vancomycin (VANCOCIN) 1,500 mg in sodium chloride 0.9 % 500 mL IVPB  Status:  Discontinued     1,500 mg 250 mL/hr over 120 Minutes Intravenous  Once 02/08/16 0815 02/09/16 1355   02/08/16 0815  vancomycin (VANCOCIN) 1,500 mg in sodium chloride 0.9 % 500 mL IVPB  Status:  Discontinued     1,500 mg 250 mL/hr over 120 Minutes Intravenous Every 12 hours 02/08/16 0811 02/08/16 0812   02/08/16 0430  piperacillin-tazobactam  (ZOSYN) IVPB 3.375 g     3.375 g 100 mL/hr over 30 Minutes Intravenous  Once 02/08/16 0419 02/08/16 0532      Subjective:   [name] seen and examined today.  Patient feels better this morning. Feels his output is thickening up.  Does not want ensure any more- feels this is causing him to have more output.  Does not like the taste of paregoric but will use it. Denies chest pain, shortness of breath, nausea, vomiting, dizziness, headache.   Objective:   Vitals:   03/01/16 2136 03/02/16 0603 03/02/16 2155 03/03/16 0600  BP: (!) 107/57 109/61 109/67 (!) 100/57  Pulse: 79 70 80 77  Resp: 16 18 19 18   Temp: 98.2 F (36.8 C) 98.2 F (36.8 C) 98.5 F (36.9 C) 98.6 F (37 C)  TempSrc: Oral Oral  Oral  SpO2: 97% 100% 100% 100%  Weight:      Height:        Intake/Output Summary (Last 24 hours) at 03/03/16 1035 Last data filed at 03/03/16 0653  Gross per 24 hour  Intake              380 ml  Output             3750 ml  Net            -3370 ml   Filed Weights   02/13/16 0350 02/14/16 0400 02/16/16 0400  Weight: 82.6 kg (182 lb 1.6 oz) 72.9 kg (160 lb 11.5 oz) 55.9 kg (123 lb 3.8 oz)    Exam  General: Well developed, well nourished, NAD  HEENT: NCAT,mucous membranes moist.   Cardiovascular: S1 S2 auscultated, no murmurs, RRR  Respiratory: Clear to auscultation bilaterally with equal chest rise  Abdomen: Soft, nontender, nondistended, + bowel sounds, + ileostomy  Extremities: warm dry without cyanosis clubbing or edema  Neuro: AAOx3, nonfocal   Psych: Normal affect and demeanor, pleasant   Data Reviewed: I have personally reviewed following labs and imaging studies  CBC:  Recent Labs Lab 02/26/16 0500 02/27/16 0440 02/29/16 0500 03/01/16 0609 03/02/16 0903 03/03/16 0332  WBC 14.5* 10.0 8.2 8.9 9.4 8.4  NEUTROABS 10.2* 7.1 4.9  --   --   --   HGB 7.1* 7.7* 6.5* 8.4* 9.9* 8.5*  HCT 21.0* 23.5* 20.1* 25.6* 29.6* 26.0*  MCV 83.3 83.6 85.2 83.7 83.9 83.6    PLT 280 355 213 324  [id number]   Basic Metabolic Panel:  Recent Labs Lab 02/28/16 0500 02/29/16 0500 03/01/16 0609 03/02/16 0515 03/03/16 0332  NA 135 138 137 134* 136  K 2.8* 3.3* 3.3* 3.2* 3.4*  CL 104 109 107 101 101  CO2 24 23 24 25 26   GLUCOSE 118* 344* 244* 81 91  BUN <5* <5* <5* 5* 6  CREATININE 0.51* 0.45* 0.49* 0.50* 0.55*  CALCIUM 7.7* 7.2* 7.7* 8.2* 8.3*  MG 1.6* 1.7 1.7 1.8 1.9  PHOS 3.0 3.2 3.7 4.0 4.4   GFR: Estimated Creatinine Clearance: 88.3 mL/min (by C-G formula based on SCr of 0.55 mg/dL (L)). Liver Function Tests: No results for input(s): AST, ALT, ALKPHOS, BILITOT, PROT, ALBUMIN in the last 168 hours. No results for input(s): LIPASE, AMYLASE in the last 168 hours. No results for input(s): AMMONIA in the last 168 hours. Coagulation Profile: No results for input(s): INR, PROTIME in the last 168 hours. Cardiac Enzymes: No results for input(s): CKTOTAL, CKMB, CKMBINDEX, TROPONINI in the last 168 hours. BNP (last 3 results) No results for input(s): PROBNP in the last 8760 hours. HbA1C: No results for input(s): HGBA1C in the last 72 hours. CBG: No results for input(s): GLUCAP in the last 168 hours. Lipid Profile: No results for input(s): CHOL, HDL, LDLCALC, TRIG, CHOLHDL, LDLDIRECT in the last 72 hours. Thyroid Function Tests: No results for input(s): TSH, T4TOTAL, FREET4, T3FREE, THYROIDAB in the last 72 hours. Anemia Panel: No results for input(s): VITAMINB12, FOLATE, FERRITIN, TIBC, IRON, RETICCTPCT in the last 72 hours. Urine analysis:    Component Value Date/Time   COLORURINE YELLOW 02/29/2016 [place] 02/29/2016 1526   LABSPEC 1.009 02/29/2016 1526   PHURINE 6.5 02/29/2016 1526   GLUCOSEU NEGATIVE 02/29/2016 1526   HGBUR NEGATIVE 02/29/2016 [place] 02/29/2016 [place] 02/29/2016 [place] 02/29/2016 1526   UROBILINOGEN 1.0 07/28/2007 1539   NITRITE NEGATIVE 02/29/2016  1526   LEUKOCYTESUR NEGATIVE 02/29/2016 1526   Sepsis Labs: @LABRCNTIP (procalcitonin:4,lacticidven:4)  ) Recent Results (from the past 240 hour(s))  Urine culture     Status: None   Collection Time: 02/25/16  1:43 PM  Result Value Ref Range Status   Specimen Description URINE, RANDOM  Final   Special Requests NONE  Final   Culture NO GROWTH  Final   Report Status 02/26/2016 FINAL  Final  Culture, Urine     Status: None   Collection Time: 02/29/16  3:27 PM  Result Value Ref Range Status   Specimen Description URINE, CLEAN CATCH  Final   Special Requests NONE  Final   Culture NO GROWTH  Final   Report Status 03/01/2016 FINAL  Final  C difficile quick scan w PCR reflex     Status: None   Collection Time: 03/02/16  5:33 PM  Result Value Ref Range Status   C Diff antigen NEGATIVE NEGATIVE Final   C Diff toxin NEGATIVE NEGATIVE Final   C Diff interpretation No C. difficile detected.  Final      Radiology Studies: No results found.   Scheduled Meds: . diphenoxylate-atropine  2 tablet Oral QID  . feeding supplement (ENSURE ENLIVE)  237 mL Oral BID BM  . ferrous sulfate  325 mg Oral TID WC  . loperamide  8 mg Oral QID  . loratadine  10 mg Oral Daily  . oxymorphone  40 mg Oral TID  . pantoprazole  40 mg Oral Daily  . potassium & sodium  phosphates  1 packet Oral QID  . potassium chloride  40 mEq Oral Once  . psyllium  2 packet Oral TID   Continuous Infusions:    LOS: 24 days   Time Spent in minutes   30 minutes  [name] D.O. on 03/03/2016 at 10:35 AM  Between 7am to 7pm - Pager - [phone]  After 7pm go to [email or web address] - password [email or web address]  And look for the night coverage person covering for me after hours  [facility] Office  [phone]

## 2016-03-03 NOTE — Progress Notes (Signed)
20 Days Post-Op  Subjective: Doing fine, tol liquids and eating breakfast, urine not that dark  Objective: Vital signs in last 24 hours: Temp:  [98.5 F (36.9 C)-98.6 F (37 C)] 98.6 F (37 C) (11/18 0600) Pulse Rate:  [77-80] 77 (11/18 0600) Resp:  [18-19] 18 (11/18 0600) BP: (100-109)/(57-67) 100/57 (11/18 0600) SpO2:  [100 %] 100 % (11/18 0600) Last BM Date: 03/02/16  Intake/Output from previous day: 11/17 0701 - 11/18 0700 In: 380 [P.O.:360; I.V.:20] Out: 3750 [Urine:1400; Stool:2350] Intake/Output this shift: No intake/output data recorded.  GI: soft nt/nd ileostomy functional  Lab Results:   Recent Labs  03/02/16 0903 03/03/16 0332  WBC 9.4 8.4  HGB 9.9* 8.5*  HCT 29.6* 26.0*  PLT 360 364   BMET  Recent Labs  03/02/16 0515 03/03/16 0332  NA 134* 136  K 3.2* 3.4*  CL 101 101  CO2 25 26  GLUCOSE 81 91  BUN 5* 6  CREATININE 0.50* 0.55*  CALCIUM 8.2* 8.3*   PT/INR No results for input(s): LABPROT, INR in the last 72 hours. ABG No results for input(s): PHART, HCO3 in the last 72 hours.  Invalid input(s): PCO2, PO2  Studies/Results: No results found.  Anti-infectives: Anti-infectives    Start     Dose/Rate Route Frequency Ordered Stop   02/27/16 1345  amoxicillin-clavulanate (AUGMENTIN) 875-125 MG per tablet 1 tablet  Status:  Discontinued     1 tablet Oral Every 12 hours 02/27/16 1337 03/02/16 1307   02/26/16 1400  piperacillin-tazobactam (ZOSYN) IVPB 3.375 g  Status:  Discontinued     3.375 g 12.5 mL/hr over 240 Minutes Intravenous Every 8 hours 02/26/16 1059 02/27/16 1337   02/26/16 1045  metroNIDAZOLE (FLAGYL) IVPB 500 mg  Status:  Discontinued     500 mg 100 mL/hr over 60 Minutes Intravenous Every 8 hours 02/26/16 1038 02/26/16 1100   02/18/16 1400  tobramycin (NEBCIN) 490 mg in dextrose 5 % 100 mL IVPB     7 mg/kg  70.7 kg (Ideal) 112.3 mL/hr over 60 Minutes Intravenous Every 24 hours 02/18/16 1041 02/20/16 1453   02/15/16 0031   tigecycline (TYGACIL) 50 mg in sodium chloride 0.9 % 100 mL IVPB     50 mg 200 mL/hr over 30 Minutes Intravenous Every 12 hours 02/14/16 1232 02/20/16 1325   02/14/16 1400  tobramycin (NEBCIN) 510 mg in dextrose 5 % 100 mL IVPB  Status:  Discontinued     7 mg/kg  72.9 kg 112.8 mL/hr over 60 Minutes Intravenous Every 24 hours 02/14/16 1241 02/18/16 1041   02/14/16 1245  tigecycline (TYGACIL) 100 mg in sodium chloride 0.9 % 100 mL IVPB     100 mg 200 mL/hr over 30 Minutes Intravenous  Once 02/14/16 1232 02/14/16 1342   02/13/16 1200  cefTRIAXone (ROCEPHIN) 2 g in dextrose 5 % 50 mL IVPB  Status:  Discontinued     2 g 100 mL/hr over 30 Minutes Intravenous Every 24 hours 02/13/16 0857 02/14/16 1205   02/13/16 1200  tobramycin (NEBCIN) 530 mg in dextrose 5 % 100 mL IVPB  Status:  Discontinued     7 mg/kg  75.5 kg (Adjusted) 113.3 mL/hr over 60 Minutes Intravenous Every 24 hours 02/13/16 1123 02/14/16 1234   02/10/16 1500  anidulafungin (ERAXIS) 100 mg in sodium chloride 0.9 % 100 mL IVPB     100 mg over 90 Minutes Intravenous Every 24 hours 02/09/16 0804 02/15/16 1556   02/10/16 1500  vancomycin (VANCOCIN) IVPB 1000 mg/200 mL  premix  Status:  Discontinued     1,000 mg 200 mL/hr over 60 Minutes Intravenous Every 8 hours 02/10/16 1239 02/13/16 0856   02/10/16 1500  imipenem-cilastatin (PRIMAXIN) 1,000 mg in sodium chloride 0.9 % 250 mL IVPB  Status:  Discontinued     1,000 mg 250 mL/hr over 60 Minutes Intravenous Every 8 hours 02/10/16 1239 02/13/16 0856   02/09/16 1000  imipenem-cilastatin (PRIMAXIN) 1,000 mg in sodium chloride 0.9 % 250 mL IVPB  Status:  Discontinued     1,000 mg 250 mL/hr over 60 Minutes Intravenous Every 8 hours 02/09/16 0812 02/10/16 1239   02/09/16 1000  vancomycin (VANCOCIN) IVPB 1000 mg/200 mL premix  Status:  Discontinued     1,000 mg 200 mL/hr over 60 Minutes Intravenous Every 8 hours 02/09/16 0812 02/10/16 1239   02/09/16 0900  anidulafungin (ERAXIS) 200 mg in  sodium chloride 0.9 % 200 mL IVPB     200 mg over 180 Minutes Intravenous  Once 02/09/16 0804 02/09/16 1209   02/09/16 0830  anidulafungin (ERAXIS) 100 mg in sodium chloride 0.9 % 100 mL IVPB  Status:  Discontinued     100 mg over 90 Minutes Intravenous Every 24 hours 02/08/16 0811 02/09/16 0804   02/08/16 1800  vancomycin (VANCOCIN) IVPB 750 mg/150 ml premix  Status:  Discontinued     750 mg 150 mL/hr over 60 Minutes Intravenous Every 8 hours 02/08/16 0847 02/09/16 0812   02/08/16 1400  piperacillin-tazobactam (ZOSYN) IVPB 3.375 g  Status:  Discontinued     3.375 g 12.5 mL/hr over 240 Minutes Intravenous Every 8 hours 02/08/16 0432 02/08/16 0802   02/08/16 0900  imipenem-cilastatin (PRIMAXIN) 500 mg in sodium chloride 0.9 % 100 mL IVPB  Status:  Discontinued     500 mg 200 mL/hr over 30 Minutes Intravenous Every 6 hours 02/08/16 0818 02/09/16 0812   02/08/16 0830  ertapenem (INVANZ) 1 g in sodium chloride 0.9 % 50 mL IVPB  Status:  Discontinued     1 g 100 mL/hr over 30 Minutes Intravenous Every 24 hours 02/08/16 0756 02/08/16 0812   02/08/16 0830  anidulafungin (ERAXIS) 200 mg in sodium chloride 0.9 % 200 mL IVPB  Status:  Discontinued     200 mg over 180 Minutes Intravenous  Once 02/08/16 0811 02/09/16 1457   02/08/16 0830  vancomycin (VANCOCIN) 1,500 mg in sodium chloride 0.9 % 500 mL IVPB  Status:  Discontinued     1,500 mg 250 mL/hr over 120 Minutes Intravenous  Once 02/08/16 0815 02/09/16 1355   02/08/16 0815  vancomycin (VANCOCIN) 1,500 mg in sodium chloride 0.9 % 500 mL IVPB  Status:  Discontinued     1,500 mg 250 mL/hr over 120 Minutes Intravenous Every 12 hours 02/08/16 0811 02/08/16 0812   02/08/16 0430  piperacillin-tazobactam (ZOSYN) IVPB 3.375 g     3.375 g 100 mL/hr over 30 Minutes Intravenous  Once 02/08/16 0419 02/08/16 0532      Assessment/Plan: Crohns disease s/p ileocecectomy 01/27/16 for SB perforation Anastomotic leak/ peritoneal abscess - s/p exp lap/  resection of anastomosis 02/08/16 Washout/ VAC change 02/10/16 Washout/ fascial closure 02/12/16 I think he can be discharged on meds now and f/u with Dr Johney Maine. Just needs to continue hydration.   [facility] 03/03/2016

## 2016-03-03 NOTE — Consult Note (Signed)
Reason for Consult: Increased ileostomy output Referring Physician: Surgical team  [name] is an 50 y.o. male.  HPI: Patient seen and examined and discussed with the surgical team and his hospital computer chart reviewed and he had no previous GI problems to include presented with his first perforation over a month ago and his surgeries and pathology was reviewed and he's had a total of close to 100 cm of small bowel removed and my partner Dr. B made some suggestions yesterday which have seemed to help and his output seems to be decreased and he is doing surprisingly well and does not have much complaints other than wanting his pain medicine and problems sleeping and his pathology does not suggest Crohn's and he had been on daily Goody powders once or twice a day and maybe this is all aspirin-induced and his family history is negative for any obvious GI problem and he had never seen a GI doctor before and his main problem has been his back over the years and he has no other complaints and is eating well  Past Medical History:  Diagnosis Date  . Asthma   . Complication of anesthesia    " I got confused "  . Pneumonia   . Small bowel perforation ([place]) 01/2016    Past Surgical History:  Procedure Laterality Date  . APPLICATION OF WOUND VAC N/A 02/08/2016   Procedure: APPLICATION OF WOUND VAC;  Surgeon: Judeth Horn, MD;  Location: [place];  Service: General;  Laterality: N/A;  . BACK SURGERY    . BOWEL RESECTION N/A 01/27/2016   Procedure: SMALL BOWEL RESECTION;  Surgeon: Greer Pickerel, MD;  Location: [place];  Service: General;  Laterality: N/A;  . EXPLORATORY LAPAROTOMY  01/27/2016  . ILEOCECETOMY N/A 02/08/2016   Procedure: Pennie Rushing;  Surgeon: Judeth Horn, MD;  Location: [place];  Service: General;  Laterality: N/A;  . ILEOSTOMY N/A 02/10/2016   Procedure: ILEOSTOMY;  Surgeon: Judeth Horn, MD;  Location: [place];  Service: General;  Laterality: N/A;  . LAPAROTOMY N/A 01/27/2016   Procedure: EXPLORATORY LAPAROTOMY;  Surgeon: Greer Pickerel, MD;  Location: [place];  Service: General;  Laterality: N/A;  . LAPAROTOMY N/A 02/08/2016   Procedure: EXPLORATORY LAPAROTOMY;  Surgeon: Judeth Horn, MD;  Location: [facility];  Service: General;  Laterality: N/A;  . LAPAROTOMY N/A 02/10/2016   Procedure: EXPLORATORY LAPAROTOMY;  Surgeon: Judeth Horn, MD;  Location: [place];  Service: General;  Laterality: N/A;  . VACUUM ASSISTED CLOSURE CHANGE N/A 02/12/2016   Procedure: EXPLORATORY LAPAROTOMY WITH CLOSURE;  Surgeon: Erroll Luna, MD;  Location: [place];  Service: General;  Laterality: N/A;    History reviewed. No pertinent family history.  Social History:  reports that he has been smoking Cigarettes.  He has a 7.50 pack-year smoking history. He has never used smokeless tobacco. He reports that he does not drink alcohol or use drugs.  Allergies:  Allergies  Allergen Reactions  . Celecoxib     Other reaction(s): IRRITABILITY  . Clonazepam Itching  . Cyclobenzaprine     Other reaction(s): IRRITABILITY  . Dexamethasone     Other reaction(s): IRRITABILITY  . Diazepam     Other reaction(s): IRRITABILITY  . Etodolac     Other reaction(s): IRRITABILITY  . Gabapentin     Other reaction(s): IRRITABILITY  . Metaxalone     Other reaction(s): IRRITABILITY  . Methocarbamol     Other reaction(s): IRRITABILITY  . Pregabalin     Other reaction(s): IRRITABILITY  .  Propoxyphene     Other reaction(s): IRRITABILITY  . Tramadol     Other reaction(s): IRRITABILITY    Medications: I have reviewed the patient's current medications.  Results for orders placed or performed during the hospital encounter of 02/08/16 (from the past 48 hour(s))  Magnesium     Status: None   Collection Time: 03/02/16  5:15 AM  Result Value Ref Range   Magnesium 1.8 1.7 - 2.4 mg/dL  Phosphorus     Status: None   Collection Time: 03/02/16  5:15 AM  Result Value Ref Range   Phosphorus 4.0 2.5 - 4.6 mg/dL  Basic  metabolic panel     Status: Abnormal   Collection Time: 03/02/16  5:15 AM  Result Value Ref Range   Sodium 134 (L) 135 - 145 mmol/L   Potassium 3.2 (L) 3.5 - 5.1 mmol/L   Chloride 101 101 - 111 mmol/L   CO2 25 22 - 32 mmol/L   Glucose, Bld 81 65 - 99 mg/dL   BUN 5 (L) 6 - 20 mg/dL   Creatinine, Ser 0.50 (L) 0.61 - 1.24 mg/dL   Calcium 8.2 (L) 8.9 - 10.3 mg/dL   GFR calc non Af Amer >60 >60 mL/min   GFR calc Af Amer >60 >60 mL/min    Comment: (NOTE) The eGFR has been calculated using the CKD EPI equation. This calculation has not been validated in all clinical situations. eGFR's persistently <60 mL/min signify possible Chronic Kidney Disease.    Anion gap 8 5 - 15  CBC     Status: Abnormal   Collection Time: 03/02/16  9:03 AM  Result Value Ref Range   WBC 9.4 4.0 - 10.5 K/uL   RBC 3.53 (L) 4.22 - 5.81 MIL/uL   Hemoglobin 9.9 (L) 13.0 - 17.0 g/dL   HCT 29.6 (L) 39.0 - 52.0 %   MCV 83.9 78.0 - 100.0 fL   MCH 28.0 26.0 - 34.0 pg   MCHC 33.4 30.0 - 36.0 g/dL   RDW 16.7 (H) 11.5 - 15.5 %   Platelets 360 150 - 400 K/uL  C difficile quick scan w PCR reflex     Status: None   Collection Time: 03/02/16  5:33 PM  Result Value Ref Range   C Diff antigen NEGATIVE NEGATIVE   C Diff toxin NEGATIVE NEGATIVE   C Diff interpretation No C. difficile detected.   Magnesium     Status: None   Collection Time: 03/03/16  3:32 AM  Result Value Ref Range   Magnesium 1.9 1.7 - 2.4 mg/dL  Phosphorus     Status: None   Collection Time: 03/03/16  3:32 AM  Result Value Ref Range   Phosphorus 4.4 2.5 - 4.6 mg/dL  CBC     Status: Abnormal   Collection Time: 03/03/16  3:32 AM  Result Value Ref Range   WBC 8.4 4.0 - 10.5 K/uL   RBC 3.11 (L) 4.22 - 5.81 MIL/uL   Hemoglobin 8.5 (L) 13.0 - 17.0 g/dL   HCT 26.0 (L) 39.0 - 52.0 %   MCV 83.6 78.0 - 100.0 fL   MCH 27.3 26.0 - 34.0 pg   MCHC 32.7 30.0 - 36.0 g/dL   RDW 16.7 (H) 11.5 - 15.5 %   Platelets 364 150 - 400 K/uL  Basic metabolic panel      Status: Abnormal   Collection Time: 03/03/16  3:32 AM  Result Value Ref Range   Sodium 136 135 - 145 mmol/L   Potassium  3.4 (L) 3.5 - 5.1 mmol/L   Chloride 101 101 - 111 mmol/L   CO2 26 22 - 32 mmol/L   Glucose, Bld 91 65 - 99 mg/dL   BUN 6 6 - 20 mg/dL   Creatinine, Ser 0.55 (L) 0.61 - 1.24 mg/dL   Calcium 8.3 (L) 8.9 - 10.3 mg/dL   GFR calc non Af Amer >60 >60 mL/min   GFR calc Af Amer >60 >60 mL/min    Comment: (NOTE) The eGFR has been calculated using the CKD EPI equation. This calculation has not been validated in all clinical situations. eGFR's persistently <60 mL/min signify possible Chronic Kidney Disease.    Anion gap 9 5 - 15    No results found.  ROS negative except above Blood pressure (!) 100/57, pulse 77, temperature 98.6 F (37 C), temperature source Oral, resp. rate 18, height 5' 9" (1.753 m), weight 55.9 kg (123 lb 3.8 oz), SpO2 100 %. Physical Exam vital signs stable afebrile no acute distress exam is pertinent for his abdomen being soft nontender occasional bowel sounds decreased today ileostomy output labs and CTs were reviewed as well as pathology as above  Assessment/Plan: Increased ileostomy output postop Plan: I cautioned him about aspirin and nonsteroidals as an outpatient we can see how the current changes work and consider changing fiber to Questran at some point and possibly even octreotide if needed and please call me this weekend if I can be of any further assistance otherwise will ask the hospital team next week to check on him  [facility] E 03/03/2016, 10:46 AM

## 2016-03-04 LAB — CBC
HEMATOCRIT: 27.6 % — AB (ref 39.0–52.0)
HEMOGLOBIN: 9 g/dL — AB (ref 13.0–17.0)
MCH: 27.4 pg (ref 26.0–34.0)
MCHC: 32.6 g/dL (ref 30.0–36.0)
MCV: 83.9 fL (ref 78.0–100.0)
Platelets: 382 10*3/uL (ref 150–400)
RBC: 3.29 MIL/uL — ABNORMAL LOW (ref 4.22–5.81)
RDW: 16.6 % — AB (ref 11.5–15.5)
WBC: 8.3 10*3/uL (ref 4.0–10.5)

## 2016-03-04 LAB — BASIC METABOLIC PANEL
Anion gap: 6 (ref 5–15)
BUN: 7 mg/dL (ref 6–20)
CALCIUM: 8.4 mg/dL — AB (ref 8.9–10.3)
CHLORIDE: 103 mmol/L (ref 101–111)
CO2: 27 mmol/L (ref 22–32)
CREATININE: 0.59 mg/dL — AB (ref 0.61–1.24)
GFR calc non Af Amer: >60 mL/min (ref >60)
GLUCOSE: 98 mg/dL (ref 65–99)
Potassium: 3.5 mmol/L (ref 3.5–5.1)
Sodium: 136 mmol/L (ref 135–145)

## 2016-03-04 LAB — PHOSPHORUS: Phosphorus: 4.4 mg/dL (ref 2.5–4.6)

## 2016-03-04 LAB — MAGNESIUM: Magnesium: 1.8 mg/dL (ref 1.7–2.4)

## 2016-03-04 MED ORDER — PAREGORIC 2 MG/5ML PO TINC: 5.0000 mL | ORAL | 0 refills | Status: DC | PRN

## 2016-03-04 MED ORDER — PANTOPRAZOLE SODIUM 40 MG PO TBEC
40.0000 mg | DELAYED_RELEASE_TABLET | Freq: Every day | ORAL | 0 refills | Status: DC
Start: 2016-03-05 — End: 2016-07-18

## 2016-03-04 MED ORDER — DIPHENOXYLATE-ATROPINE 2.5-0.025 MG PO TABS: 2.0000 | ORAL_TABLET | Freq: Four times a day (QID) | ORAL | 0 refills | Status: DC

## 2016-03-04 MED ORDER — LOPERAMIDE HCL 2 MG PO CAPS: 8.0000 mg | ORAL_CAPSULE | Freq: Four times a day (QID) | ORAL | 0 refills | Status: DC

## 2016-03-04 MED ORDER — FERROUS SULFATE 325 (65 FE) MG PO TABS: 325.0000 mg | ORAL_TABLET | Freq: Three times a day (TID) | ORAL | 0 refills | Status: DC

## 2016-03-04 MED ORDER — PSYLLIUM 95 % PO PACK
2.0000 | PACK | Freq: Three times a day (TID) | ORAL | 0 refills | Status: DC
Start: 2016-03-04 — End: 2021-04-03

## 2016-03-04 MED ORDER — PSYLLIUM 95 % PO PACK: 2.0000 | PACK | Freq: Three times a day (TID) | ORAL | 0 refills | Status: DC

## 2016-03-04 MED ORDER — PANTOPRAZOLE SODIUM 40 MG PO TBEC: 40.0000 mg | DELAYED_RELEASE_TABLET | Freq: Every day | ORAL | 0 refills | Status: DC

## 2016-03-04 MED ORDER — POTASSIUM & SODIUM PHOSPHATES 280-160-250 MG PO PACK: 1.0000 | PACK | Freq: Four times a day (QID) | ORAL | 0 refills | Status: DC

## 2016-03-04 NOTE — Discharge Summary (Signed)
Physician Discharge Summary  [name] [id number] DOB: [birth year] DOA: 02/08/2016  PCP: No primary care provider on file.  Admit date: 02/08/2016 Discharge date: 03/04/2016  Time spent: 45 minutes  Recommendations for Outpatient Follow-up:  Patient will be discharged to home with home health RN.  Patient will need to follow up with primary care provider within one week of discharge, repeat BMP.  Follow up with surgery, Dr. Redmond Pulling 03/16/2016 at 2:45pm.  Follow up with GI, Dr. Watt Climes, as needed. Continue wound care. Patient should continue medications as prescribed.  Patient should follow a regular diet.   Discharge Diagnoses:  Ischemic bowel - perf at anastomosis site - abdominal abscesses - s/p ileostomy Fever Normocytic Anemia L Pleural Effusion  Septic shock Lower extremity edema Hyopkalemia/hypomagnesemia Hyponatremia AKI Chronic pain syndrome  Discharge Condition: Stable  Diet recommendation: Regular  Filed Weights   02/13/16 0350 02/14/16 0400 02/16/16 0400  Weight: 82.6 kg (182 lb 1.6 oz) 72.9 kg (160 lb 11.5 oz) 55.9 kg (123 lb 3.8 oz)    History of present illness:  on 02/08/2016 by Ms. Roger Dredge, PA (Surgery) 50 y/o male with PMH back pain andsmall bowel perforation s/p ileocecectomy 01/28/16 who presented to [facility] with abdominal pain. He was previously discharged from hospital on 02/02/16. Patient states his abdominal pain started late last night/early this morning and is described as sharp and non-radiating. Rates pain as 10/10. Pain is worse with movement and inspiration. No relief withPO pain medication. Associated symptoms include nausea, chills, and SOB. Patient has been having daily BMs. Denies fever, CP, vomiting, diarrhea, melena, hematochezia, urinary symptoms, and weakness. He denies regular medication use.  Hospital Course:  Ischemic bowel - perf at anastomosis site - abdominal abscesses - s/p ileostomy -Ongoing care per general  surgery -Antibiotic management per ID: Completed Tigecycline and tobramycin through 02/20/2016 -Drainage/leakage noted from ileostomy site- improved -Wound care/ostomy consulted -Continue metamucil and imodium, lomotil  -Continues to have high output, once output <1L, patient can be discharged home (per surgery)- over past 24 hours: 2350 -Repeat CT scan on 11/11: persistent Diffuse peritonitis, 2 small abscesses. Patient currently on Augmentin (started on 02/27/2016- stopped on 11/17) -GI consulted and appreciated (by general surgery) -Cdiff negative -Continue paregoric  Fever -Spiked fever of 101.2 on 11/15 -has been afebrile for 72 hours -CXR unremarkable for infection  -Urine culture from 11/11 shows no growth  Normocytic Anemia -hemoglobin dropped again, currently 9 (given 1uPRBC on 11/15) -Continue to monitor CBC -Anemia panel shows Iron 11, ferritin 257  -Given dose of feraheme -Discontinue Lovenox- placed on SCDs  L Pleural Effusion  -Small on f/u CXR -follow w/o intervention for now  -No complaints of shortness of breath  Septic shock -Resolved  Lower extremity edema -resolving  Hyopkalemia/hypomagnesemia -Resolved  Hyponatremia -Resolved  AKI -Resolved  Chronic pain syndrome -Continue pain control PRN  Consultants PCCM General surgery Infectious disease  Procedures  10/25 - readmitted - Intubated and central access obtained - Exploratory surgery, wound vac placement and ileocectomy  10/27 - Exploratory surgery-peritonitis, distal ileum ischemia , s/p resection w/ ileostomy  10/29 - Exploratory laparotomy with removal of vacuum pack dressing and closure of abdominal wall  10/30 - Extubated 11/1 - [facility] assumed care  Discharge Exam: Vitals:   03/03/16 2019 03/04/16 0604  BP: 121/67 102/61  Pulse: 76 69  Resp: 15 16  Temp: 98.2 F (36.8 C) 98.1 F (36.7 C)   Patient feels better this morning. Feels his output is thickening up.  Denies  chest pain, shortness of breath, nausea, vomiting, dizziness, headache.  Exam  General: Well developed, well nourished, NAD  HEENT: NCAT,mucous membranes moist.   Cardiovascular: S1 S2 auscultated, no murmurs, RRR  Respiratory: Clear to auscultation bilaterally with equal chest rise  Abdomen: Soft, nontender, nondistended, + bowel sounds, + ileostomy  Extremities: warm dry without cyanosis clubbing or edema  Neuro: AAOx3, nonfocal   Psych: Normal affect and demeanor, pleasant  Discharge Instructions Discharge Instructions    Discharge instructions    Complete by:  As directed    Patient will be discharged to home with home health RN.  Patient will need to follow up with primary care provider within one week of discharge, repeat BMP.  Follow up with surgery, Dr. Redmond Pulling 03/16/2016 at 2:45pm.  Follow up with GI, Dr. Watt Climes, as needed. Continue wound care. Patient should continue medications as prescribed.  Patient should follow a regular diet.     Current Discharge Medication List    START taking these medications   Details  diphenoxylate-atropine (LOMOTIL) 2.5-0.025 MG tablet Take 2 tablets by mouth 4 (four) times daily. Qty: 240 tablet, Refills: 0    ferrous sulfate 325 (65 FE) MG tablet Take 1 tablet (325 mg total) by mouth 3 (three) times daily with meals. Qty: 90 tablet, Refills: 0    loperamide (IMODIUM) 2 MG capsule Take 4 capsules (8 mg total) by mouth 4 (four) times daily. Qty: 30 capsule, Refills: 0    pantoprazole (PROTONIX) 40 MG tablet Take 1 tablet (40 mg total) by mouth daily. Qty: 30 tablet, Refills: 0    paregoric 2 MG/5ML solution Take 5 mLs by mouth as needed for diarrhea or loose stools. Qty: 120 mL, Refills: 0    potassium & sodium phosphates (PHOS-NAK) 280-160-250 MG PACK Take 1 packet by mouth 4 (four) times daily. Qty: 120 packet, Refills: 0    psyllium (HYDROCIL/METAMUCIL) 95 % PACK Take 2 packets by mouth 3 (three) times daily. Qty: 180 each,  Refills: 0      CONTINUE these medications which have NOT CHANGED   Details  ALPRAZolam (XANAX) 0.5 MG tablet Take 0.5 mg by mouth 3 (three) times daily as needed for anxiety.     Diphenhydramine-APAP, sleep, (GOODY PM PO) Take 1 Package by mouth daily as needed (sleep/pain).    oxymorphone (OPANA ER) 40 MG 12 hr tablet Take 40 mg by mouth 3 (three) times daily.       Allergies  Allergen Reactions  . Celecoxib     Other reaction(s): IRRITABILITY  . Clonazepam Itching  . Cyclobenzaprine     Other reaction(s): IRRITABILITY  . Dexamethasone     Other reaction(s): IRRITABILITY  . Diazepam     Other reaction(s): IRRITABILITY  . Etodolac     Other reaction(s): IRRITABILITY  . Gabapentin     Other reaction(s): IRRITABILITY  . Metaxalone     Other reaction(s): IRRITABILITY  . Methocarbamol     Other reaction(s): IRRITABILITY  . Pregabalin     Other reaction(s): IRRITABILITY  . Propoxyphene     Other reaction(s): IRRITABILITY  . Tramadol     Other reaction(s): IRRITABILITY   Follow-up Information    [facility] Follow up.   Specialty:  Home Health Services Why:  For home health, referral to be made through [employer] Contact information: [place] [address] [phone]        Gayland Curry, MD Follow up on  03/16/2016.   Specialty:  General Surgery Why:  Your appointment is 03/16/16 at 2:45pm. Contact information: [place]  [place] [address] [phone]        [facility] E, MD. Schedule an appointment as soon as possible for a visit.   Specialty:  Gastroenterology Why:  As neeed Contact information: [address] [place] [address] [phone]            The results of significant diagnostics from this hospitalization (including imaging, microbiology, ancillary and laboratory) are listed below for reference.    Significant Diagnostic Studies: Dg Chest 2  View  Result Date: 02/29/2016 CLINICAL DATA:  Fever. EXAM: CHEST  2 VIEW COMPARISON:  02/15/2016 FINDINGS: Again noted are patchy densities at the left lung base. Overall, the aeration at the lung bases has improved. Surgical hardware in the lower thoracic spine. PICC line tip is near the superior cavoatrial based on the lateral view. Heart size is within normal limits. The trachea is midline. Cannot exclude trace left pleural fluid. IMPRESSION: Improved aeration at the lung bases with residual patchy densities at left lung base. These densities may represent areas of atelectasis. Electronically Signed   By: Markus Daft M.D.   On: 02/29/2016 08:41   Ct Abdomen Pelvis W Contrast  Result Date: 02/25/2016 CLINICAL DATA:  50 year old male inpatient with distal small bowel perforation status post emergent ileoectomy [id number], complicated by ileocolic anastomotic leak with extensive peritoneal cavity abscess formation status post exploratory laparotomy, drainage of the peritoneal abscesses, resection of ileocolonic anastomosis 02/08/2016, with resection of ischemic distal ileum and ileostomy creation 02/10/2016, with closure of abdominal wall 02/12/2016, now with worsening leukocytosis. EXAM: CT ABDOMEN AND PELVIS WITH CONTRAST TECHNIQUE: Multidetector CT imaging of the abdomen and pelvis was performed using the standard protocol following bolus administration of intravenous contrast. CONTRAST:  15mL ISOVUE-300 IOPAMIDOL (ISOVUE-300) INJECTION 61% COMPARISON:  02/08/2016 CT abdomen/ pelvis. FINDINGS: Lower chest: Small left pleural effusion. No right pleural effusion. Trace pericardial effusion/ thickening, slightly increased. Patchy bandlike consolidation in the peripheral basilar left lower lobe with associated volume loss, mildly increased. Small subpleural bleb in the basilar left lower lobe appears smaller. Hepatobiliary: Normal liver size. Hypodense 2.1 x 2.1 cm segment 3 left liver lobe lesion (series  201/ image 24), previously 1.8 x 1.7 cm, mildly increased. At least four scattered subcapsular poorly marginated low-attenuation liver lesions, largest 4.5 x 2.6 cm at the left liver dome (series 201/ image 12), not appreciably changed. Subcentimeter right liver lobe hypodense lesion is too small to characterize and stable. No new liver lesions. Normal gallbladder with no radiopaque cholelithiasis. No biliary ductal dilatation. Pancreas: Normal, with no mass or duct dilation. Spleen: Normal size spleen. New subcapsular 3.8 x 2.3 x 4.5 cm collection in the peripheral upper spleen (series 201/image 10). Adrenals/Urinary Tract: Normal adrenals. No hydronephrosis. Stable mild scarring in the posterior upper right kidney versus developmental junctional cortical defect. No renal masses. Normal bladder. Stomach/Bowel: Grossly normal stomach. Patient is status post ileocecal and distal small bowel resection with ventral right abdominal wall end ileostomy. Remnant small-bowel loops are top-normal in caliber. Oral contrast progresses to the ileostomy bag. There is mild diffuse small bowel wall thickening without appreciable pneumatosis. The remnant proximally blind-ending large-bowel is collapsed with diffuse large bowel wall thickening and no appreciable pneumatosis. Vascular/Lymphatic: Normal caliber abdominal aorta. Patent portal, splenic, hepatic and renal veins. Stable mild aortocaval, gastrohepatic ligament, paraceliac and left para-aortic retroperitoneal lymphadenopathy, measuring up to the  1.3 cm in the gastrohepatic ligament (series 201/ image 15), 1.1 cm in the aortocaval chain (series 201/ image 27) and 1.3 cm in the left para-aortic chain (series 201/ image 29). Stable mild right mesenteric adenopathy measuring up to 1.1 cm (series 201/ image 33). No new pathologically enlarged abdominopelvic nodes. Reproductive: Normal size prostate. Other: No pneumoperitoneum. Persistent fat stranding and ill-defined fluid  throughout the peritoneal cavity. Small 2.8 x 1.9 x 2.3 cm thick walled fluid collection in the posterior right lower peritoneal cavity abutting the right psoas muscle laterally (series 201/ image 47). Small 2.3 x 1.6 x 1.6 cm thick walled fluid collection in the central pelvis between the sigmoid colon and pelvic small bowel (series 201/ image 65). Midline laparotomy. No superficial fluid collections. Musculoskeletal: No aggressive appearing focal osseous lesions. Moderate lumbar spondylosis. Stable postsurgical changes from anterior spinal fusion at L3-4 and L4-5. Partially visualized spinal fusion hardware in the left lower thoracic spine. IMPRESSION: 1. Persistent diffuse peritonitis. Two small peritoneal abscesses as described in the posterior right lower quadrant and central pelvis. 2. New small low attenuation subcapsular collection in the peripheral upper spleen, possibly a subcapsular splenic abscess or infarct. 3. Mild growth of a 2.1 cm low-attenuation segment 3 left liver lobe mass, which could represent an hepatic abscess. 4. Additional low-attenuation liver lesions are stable and indeterminate and warrant outpatient evaluation with MRI abdomen without and with IV contrast when feasible. 5. No evidence of small bowel obstruction. Diffuse small and large bowel wall thickening probably due to peritonitis, with no specific findings of bowel ischemia (no pneumoperitoneum and no appreciable pneumatosis). 6. Small left pleural effusion. Patchy bandlike consolidation and volume loss in the peripheral basilar left lower lobe, favor atelectasis. 7. Trace pericardial effusion/thickening, slightly increased. Electronically Signed   By: Ilona Sorrel M.D.   On: 02/25/2016 16:17   Ct Abdomen Pelvis W Contrast  Result Date: 02/08/2016 CLINICAL DATA:  Recent bowel perforation, severe abdominal pain beginning 1-1/2 days ago. Follow-up probable bowel obstruction. Elevated lactate. EXAM: CT ABDOMEN AND PELVIS WITH  CONTRAST TECHNIQUE: Multidetector CT imaging of the abdomen and pelvis was performed using the standard protocol following bolus administration of intravenous contrast. CONTRAST:  155mL ISOVUE-300 IOPAMIDOL (ISOVUE-300) INJECTION 61% COMPARISON:  Abdominal radiograph February 08, 2016 at 0411 hours FINDINGS: LOWER CHEST: Bilateral lower lobe dependent atelectasis with small LEFT pleural effusion. LEFT lung base pneumatocele wall versus bulla. Heart size is normal. No pericardial effusions. HEPATOBILIARY: Mild periportal edema. Wedge-like hypodensity dome of the liver which persist on delayed phase. Sub cm probable cyst LEFT lobe of the liver. 17 mm hypodensity LEFT lobe of the liver, persists on delayed phase. Distended gallbladder. PANCREAS: Atrophic, otherwise unremarkable. SPLEEN: Normal. ADRENALS/URINARY TRACT: Kidneys are orthotopic, demonstrating symmetric enhancement. Focal RIGHT upper pole renal scarring. No nephrolithiasis, hydronephrosis or solid renal masses. The unopacified ureters are normal in course and caliber. Delayed imaging through the kidneys demonstrates symmetric prompt contrast excretion within the proximal urinary collecting system. Urinary bladder is partially distended and unremarkable. Normal adrenal glands. STOMACH/BOWEL: Multiple loops of fluid-filled small bowel distended a 4.2 cm with mural edema, air-fluid levels. No pneumatosis. Multiple transition points. Decompressed large bowel. VASCULAR/LYMPHATIC: Aortoiliac vessels are normal in course and caliber, mild calcific atherosclerosis. Mildly flattened inferior vena cava. Retroperitoneal lymphadenopathy measuring up to 16 mm. Porta hepatis/portacaval lymphadenopathy 14 mm short access. REPRODUCTIVE: Normal. Moderate ascites. Superimposed peritoneal enhancement. Irregular 16 x 12 cm fluid collection in the pelvis with rim enhancement. MUSCULOSKELETAL: Mild anasarca. Anterior abdominal  wall laparotomy dehiscence. Status post L3-4 and L4-5  PLIF, partially incorporated interbody fusion material. Lower thoracic fusion. IMPRESSION: Moderate tense ascites with suspected peritonitis. Irregular 16 x 12 cm early pelvic abscess. Edematous dilated small bowel favoring ileus ; given elevated lactate, early ischemia not excluded. Wedge-like hypodensity in dome of the liver with additional hepatic hypodensities, these could represent infection or, possible ischemia in the dome of the liver. Recent laparotomy with anterior abdominal wall dehiscence. Acute findings discussed with and reconfirmed by Dr.COURTNEY HORTON on 02/08/2016 at 6:58 am. Electronically Signed   By: Elon Alas M.D.   On: 02/08/2016 07:00   Dg Chest Port 1 View  Result Date: 02/15/2016 CLINICAL DATA:  Acute respiratory failure.  Pleural effusion. EXAM: PORTABLE CHEST 1 VIEW COMPARISON:  02/13/2016 FINDINGS: Enteric tube, endotracheal tube, and right jugular catheter have been removed. Right PICC remains in place with tip projecting over the cavoatrial junction/ high right atrium. The cardiomediastinal silhouette is within normal limits. Left greater than right basilar lung opacities appear minimally improved compared to the prior study. A small left pleural effusion is suspected. No pneumothorax. IMPRESSION: Minimally improved left greater than right basilar atelectasis or infiltrates. Electronically Signed   By: Logan Bores M.D.   On: 02/15/2016 07:13   Dg Chest Port 1 View  Result Date: 02/13/2016 CLINICAL DATA:  Followup endotracheal support. EXAM: PORTABLE CHEST 1 VIEW COMPARISON:  02/12/2016 FINDINGS: Endotracheal tube has its tip 2 cm above the carina. Nasogastric tube enters stomach. Right internal jugular central line has its tip in the SVC above the right atrium. Right arm PICC has its tip in the proximal right atrium. The upper lobes remain clear. There has been worsened volume loss in the lower lobes since yesterday's velum, more similar to the exam of 2 days ago.  IMPRESSION: Slight worsening of infiltrate/atelectasis in both lower lobes since yesterday's film, similar to the exam of 2 days ago. Electronically Signed   By: Nelson Chimes M.D.   On: 02/13/2016 06:57   Dg Chest Port 1 View  Result Date: 02/12/2016 CLINICAL DATA:  Respiratory failure EXAM: PORTABLE CHEST 1 VIEW COMPARISON:  Chest radiograph from one day prior. FINDINGS: Endotracheal tube tip is 2.3 cm above the carina. Enteric tube terminates in the gastric fundus. Right internal jugular central venous catheter terminates at the cavoatrial junction. Right PICC terminates at the cavoatrial junction. Spinal fusion hardware overlies the lower thoracic spine. Stable cardiomediastinal silhouette with normal heart size. No pneumothorax. No pulmonary edema. Mild patchy left lung base opacity is stable. Probable stable trace left pleural effusion. IMPRESSION: 1. Well-positioned support structures as described. No pneumothorax. 2. Stable mild patchy left lung base opacity and probable trace left pleural effusion. Electronically Signed   By: Ilona Sorrel M.D.   On: 02/12/2016 09:01   Dg Chest Port 1 View  Result Date: 02/11/2016 CLINICAL DATA:  Endotracheal tube placement. EXAM: PORTABLE CHEST 1 VIEW COMPARISON:  02/10/2016 FINDINGS: There is an endotracheal tube with the tip 2.3 cm above the carina. Left basilar chest tube in unchanged position. Right-sided PICC line with the tip projecting over the cavoatrial junction. Right IJ central venous catheter with the tip projecting over the right atrium. Small left pleural effusion and hazy left lower lobe airspace disease. Mild right lower lobe airspace disease likely reflecting atelectasis. No pneumothorax. Stable cardiomediastinal silhouette. Nasogastric tube in unchanged position. No acute osseous abnormality. IMPRESSION: 1. Endotracheal tube with the tip 2.3 cm above the carina. 2. Otherwise no significant  interval change compared with 02/10/2016. Electronically  Signed   By: Kathreen Devoid   On: 02/11/2016 08:42   Dg Chest Port 1 View  Result Date: 02/10/2016 CLINICAL DATA:  PICC line placement EXAM: PORTABLE CHEST 1 VIEW COMPARISON:  02/10/2016 FINDINGS: Right PICC line has been placed with the tip at the cavoatrial junction. Endotracheal tube, right central line, NG tube remain in place, unchanged. Bilateral lower lobe airspace opacities, left greater than right are again noted, stable. Probable small left pleural effusion. IMPRESSION: Right PICC line tip at the cavoatrial junction. Otherwise no change. Electronically Signed   By: Rolm Baptise M.D.   On: 02/10/2016 15:20   Dg Chest Port 1 View  Result Date: 02/10/2016 CLINICAL DATA:  50 year old male with shortness of breath and sepsis EXAM: PORTABLE CHEST 1 VIEW COMPARISON:  Chest radiograph dated 02/08/2016 FINDINGS: Endotracheal tube approximately 3 cm above the carina in stable positioning. Enteric tube extends into the left hemi abdomen with tip and side-port under the left hemidiaphragm. Right IJ central line with tip over right atrium in similar positioning as the prior radiograph. Patchy area of left lung base airspace density appears similar or slightly increased since the prior radiograph and may represent atelectasis versus infiltrate. Small left pleural effusion may be present. A small area of increased density at the right lung base may represent atelectasis versus pneumonia. There is no pneumothorax. The cardiac silhouette is within normal limits. Lower thoracic fusion hardware. No acute fracture. IMPRESSION: Left lung base airspace opacity appears slightly increased since the prior study. A small left pleural effusion may be present. No pneumothorax. Support line and tubes in stable positioning. Electronically Signed   By: Anner Crete M.D.   On: 02/10/2016 05:52   Dg Chest Portable 1 View  Result Date: 02/08/2016 CLINICAL DATA:  Sepsis.  Status post intubation. EXAM: PORTABLE CHEST 1 VIEW  COMPARISON:  Single-view of the chest 02/08/2016. FINDINGS: New endotracheal tube is in place with the tip in good position at the level of the clavicular heads. New right IJ approach central venous catheter tip projects just within superior vena cava. The catheter could be withdrawn 2.5-3 cm for better positioning. NG tube tip is in the fundus of the stomach in good position. Lung volumes are low. Patchy airspace disease in the lung bases is most compatible atelectasis more notable left. There is no pneumothorax or pleural effusion. Heart size is normal. Postoperative change thoracic spine is not seen. IMPRESSION: ETT and NG tube projecting good position. Tip of right IJ catheter projects just within the right atrium. Recommend withdrawal of 2.5-3 cm. Negative for pneumothorax. Patchy basilar airspace disease most compatible atelectasis. Electronically Signed   By: Inge Rise M.D.   On: 02/08/2016 09:11   Dg Abdomen Acute W/chest  Result Date: 02/08/2016 CLINICAL DATA:  Abdominal pain, recent bowel perforation. EXAM: DG ABDOMEN ACUTE W/ 1V CHEST COMPARISON:  Acute abdominal series January 27, 2016 FINDINGS: Cardiomediastinal silhouette is normal. Blunting of LEFT costophrenic angle with strandy densities. No pneumothorax. Mid thoracic fusion hardware. No intraperitoneal free air. Paucity of large bowel gas with scattered small bowel air-fluid levels, dilated small bowel to at least 4 cm. No intra-abdominal mass effect or pathologic calcifications. LEFT femoral neck probable bone island. L3-4 and L4-5 disc prosthesis. IMPRESSION: Small LEFT pleural effusion and atelectasis. Scattered air-fluid levels with paucity of large bowel gas concerning for early or partial small bowel obstruction. Electronically Signed   By: Elon Alas M.D.   On:  02/08/2016 05:09    Microbiology: Recent Results (from the past 240 hour(s))  Urine culture     Status: None   Collection Time: 02/25/16  1:43 PM  Result  Value Ref Range Status   Specimen Description URINE, RANDOM  Final   Special Requests NONE  Final   Culture NO GROWTH  Final   Report Status 02/26/2016 FINAL  Final  Culture, Urine     Status: None   Collection Time: 02/29/16  3:27 PM  Result Value Ref Range Status   Specimen Description URINE, CLEAN CATCH  Final   Special Requests NONE  Final   Culture NO GROWTH  Final   Report Status 03/01/2016 FINAL  Final  C difficile quick scan w PCR reflex     Status: None   Collection Time: 03/02/16  5:33 PM  Result Value Ref Range Status   C Diff antigen NEGATIVE NEGATIVE Final   C Diff toxin NEGATIVE NEGATIVE Final   C Diff interpretation No C. difficile detected.  Final     Labs: Basic Metabolic Panel:  Recent Labs Lab 02/29/16 0500 03/01/16 0609 03/02/16 0515 03/03/16 0332 03/04/16 0400  NA 138 137 134* 136 136  K 3.3* 3.3* 3.2* 3.4* 3.5  CL 109 107 101 101 103  CO2 23 24 25 26 27   GLUCOSE 344* 244* 81 91 98  BUN <5* <5* 5* 6 7  CREATININE 0.45* 0.49* 0.50* 0.55* 0.59*  CALCIUM 7.2* 7.7* 8.2* 8.3* 8.4*  MG 1.7 1.7 1.8 1.9 1.8  PHOS 3.2 3.7 4.0 4.4 4.4   Liver Function Tests: No results for input(s): AST, ALT, ALKPHOS, BILITOT, PROT, ALBUMIN in the last 168 hours. No results for input(s): LIPASE, AMYLASE in the last 168 hours. No results for input(s): AMMONIA in the last 168 hours. CBC:  Recent Labs Lab 02/27/16 0440 02/29/16 0500 03/01/16 0609 03/02/16 0903 03/03/16 0332 03/04/16 0400  WBC 10.0 8.2 8.9 9.4 8.4 8.3  NEUTROABS 7.1 4.9  --   --   --   --   HGB 7.7* 6.5* 8.4* 9.9* 8.5* 9.0*  HCT 23.5* 20.1* 25.6* 29.6* 26.0* 27.6*  MCV 83.6 85.2 83.7 83.9 83.6 83.9  PLT 355 213 324 360 364 382   Cardiac Enzymes: No results for input(s): CKTOTAL, CKMB, CKMBINDEX, TROPONINI in the last 168 hours. BNP: BNP (last 3 results) No results for input(s): BNP in the last 8760 hours.  ProBNP (last 3 results) No results for input(s): PROBNP in the last 8760  hours.  CBG: No results for input(s): GLUCAP in the last 168 hours.     SignedCristal Ford  [facility] 03/04/2016, 11:07 AM

## 2016-03-04 NOTE — Progress Notes (Addendum)
21 Days Post-Op  Subjective: He is doing better and I recommend that he be discharged today  No new symptoms.  No fever Ileostomy output less than 1500 mL per day.  Ileostomy output now semi-formed. CBC, C met, phosphorus and magnesium basically normal Objective: Vital signs in last 24 hours: Temp:  [98.1 F (36.7 C)-98.4 F (36.9 C)] 98.1 F (36.7 C) (11/19 0604) Pulse Rate:  [69-84] 69 (11/19 0604) Resp:  [15-18] 16 (11/19 0604) BP: (102-121)/(61-85) 102/61 (11/19 0604) SpO2:  [99 %-100 %] 100 % (11/19 0604) Last BM Date: 03/03/16  Intake/Output from previous day: 11/18 0701 - 11/19 0700 In: 120 [P.O.:120] Out: 2300 [Urine:1050; Stool:1250] Intake/Output this shift: No intake/output data recorded.  General appearance: Alert.  Cooperative.  No distress.  Mental status normal.  Temporal wasting noted GI: Soft.  Nontender.  Midline wound healed.  All sutures in place.  Ileostomy pink and healthy.  No hernia.  Semi-formed output.  Lab Results:  Results for orders placed or performed during the hospital encounter of 02/08/16 (from the past 24 hour(s))  Magnesium     Status: None   Collection Time: 03/04/16  4:00 AM  Result Value Ref Range   Magnesium 1.8 1.7 - 2.4 mg/dL  Phosphorus     Status: None   Collection Time: 03/04/16  4:00 AM  Result Value Ref Range   Phosphorus 4.4 2.5 - 4.6 mg/dL  CBC     Status: Abnormal   Collection Time: 03/04/16  4:00 AM  Result Value Ref Range   WBC 8.3 4.0 - 10.5 K/uL   RBC 3.29 (L) 4.22 - 5.81 MIL/uL   Hemoglobin 9.0 (L) 13.0 - 17.0 g/dL   HCT 27.6 (L) 39.0 - 52.0 %   MCV 83.9 78.0 - 100.0 fL   MCH 27.4 26.0 - 34.0 pg   MCHC 32.6 30.0 - 36.0 g/dL   RDW 16.6 (H) 11.5 - 15.5 %   Platelets 382 150 - 400 K/uL  Basic metabolic panel     Status: Abnormal   Collection Time: 03/04/16  4:00 AM  Result Value Ref Range   Sodium 136 135 - 145 mmol/L   Potassium 3.5 3.5 - 5.1 mmol/L   Chloride 103 101 - 111 mmol/L   CO2 27 22 - 32 mmol/L   Glucose, Bld 98 65 - 99 mg/dL   BUN 7 6 - 20 mg/dL   Creatinine, Ser 0.59 (L) 0.61 - 1.24 mg/dL   Calcium 8.4 (L) 8.9 - 10.3 mg/dL   GFR calc non Af Amer >60 >60 mL/min   GFR calc Af Amer >60 >60 mL/min   Anion gap 6 5 - 15     Studies/Results: No results found.  . diphenoxylate-atropine  2 tablet Oral QID  . feeding supplement (ENSURE ENLIVE)  237 mL Oral BID BM  . ferrous sulfate  325 mg Oral TID WC  . loperamide  8 mg Oral QID  . loratadine  10 mg Oral Daily  . oxymorphone  40 mg Oral TID  . pantoprazole  40 mg Oral Daily  . paregoric  5 mL Oral BID  . potassium & sodium phosphates  1 packet Oral QID  . psyllium  2 packet Oral TID     Assessment/Plan: s/p Procedure(s): EXPLORATORY LAPAROTOMY WITH CLOSURE   Crohns disease s/p ileocecectomy 01/27/16 for SB perforation Anastomotic leak/ peritoneal abscess - s/p exp lap/ resection of anastomosis 02/08/16 Washout/ VAC change 02/10/16 Washout/ fascial closure 02/12/16 . Ileostomy output controlled now.  Electrolyte abnormalities resolved Discussed wound care with nursing staff, and there are going to remove the remaining skin sutures and Steri-Stripped the wound.  Recommendations:    Discharge home today.  We have loaded discharge instructions for ileostomy care and other care following his surgery    He has appointment with Dr. Greer Pickerel on 03/16/2016 at 2:45 PM    Recommend outpatient follow-up with Dr. Elta Guadeloupe may guide, please see his notes.    Continue same nutritional supplementation    Continue same electrolytes supplements    Continue current doses of Lomotil, Imodium, Opana ER, paregoric, Metamucil that he is currently on    He can titrate back on these if stools slowed down further    Diet and activities discussed  We will sign off.  We will assume he will be discharged today.  Please reconsult if necessary      Anemia - hg up to 9.0 today after 1 uPRBC   On 03/01/2016 Chronic pain syndrome dating back to  2009 for back surgeries- on home meds (opana 8m TID) , xanax PRN;  also still requiring some additional dilaudid ID - all antibiotics have been discontinued, and I do not recommend any further antibiotics. VTE - SCDs FEN - Regular diet/Ensure  @PROBHOSP @  LOS: 25 days    [name] M 03/04/2016  . .prob

## 2016-05-01 ENCOUNTER — Ambulatory Visit: Payer: Self-pay | Admitting: General Surgery

## 2016-06-04 ENCOUNTER — Encounter (HOSPITAL_COMMUNITY): Payer: Self-pay

## 2016-06-04 ENCOUNTER — Encounter (HOSPITAL_COMMUNITY)
Admission: RE | Admit: 2016-06-04 | Discharge: 2016-06-04 | Disposition: A | Discharge status: Home / Self Care | Payer: Worker's Compensation | Source: Ambulatory Visit | Attending: General Surgery | Admitting: General Surgery

## 2016-06-04 ENCOUNTER — Ambulatory Visit (HOSPITAL_COMMUNITY)
Admission: RE | Admit: 2016-06-04 | Discharge: 2016-06-04 | Disposition: A | Discharge status: Home / Self Care | Payer: Worker's Compensation | Source: Ambulatory Visit | Attending: General Surgery | Admitting: General Surgery

## 2016-06-04 DIAGNOSIS — J90 Pleural effusion, not elsewhere classified: Secondary | ICD-10-CM | POA: Diagnosis not present

## 2016-06-04 DIAGNOSIS — K631 Perforation of intestine (nontraumatic): Secondary | ICD-10-CM | POA: Diagnosis not present

## 2016-06-04 DIAGNOSIS — K659 Peritonitis, unspecified: Secondary | ICD-10-CM | POA: Diagnosis not present

## 2016-06-04 DIAGNOSIS — Z01812 Encounter for preprocedural laboratory examination: Secondary | ICD-10-CM | POA: Insufficient documentation

## 2016-06-04 DIAGNOSIS — Z01818 Encounter for other preprocedural examination: Secondary | ICD-10-CM

## 2016-06-04 DIAGNOSIS — E876 Hypokalemia: Secondary | ICD-10-CM | POA: Diagnosis not present

## 2016-06-04 DIAGNOSIS — R918 Other nonspecific abnormal finding of lung field: Secondary | ICD-10-CM | POA: Diagnosis not present

## 2016-06-04 DIAGNOSIS — K559 Vascular disorder of intestine, unspecified: Secondary | ICD-10-CM | POA: Diagnosis not present

## 2016-06-04 DIAGNOSIS — Z01810 Encounter for preprocedural cardiovascular examination: Secondary | ICD-10-CM | POA: Insufficient documentation

## 2016-06-04 DIAGNOSIS — N179 Acute kidney failure, unspecified: Secondary | ICD-10-CM | POA: Diagnosis not present

## 2016-06-04 DIAGNOSIS — A419 Sepsis, unspecified organism: Secondary | ICD-10-CM | POA: Diagnosis not present

## 2016-06-04 HISTORY — DX: Vascular disorder of intestine, unspecified: K55.9

## 2016-06-04 HISTORY — DX: Sepsis, unspecified organism: A41.9

## 2016-06-04 HISTORY — DX: Headache: R51

## 2016-06-04 HISTORY — DX: Foot drop, left foot: M21.372

## 2016-06-04 HISTORY — DX: Headache, unspecified: R51.9

## 2016-06-04 HISTORY — DX: Anxiety disorder, unspecified: F41.9

## 2016-06-04 HISTORY — DX: Personal history of other medical treatment: Z92.89

## 2016-06-04 HISTORY — DX: Severe sepsis with septic shock: R65.21

## 2016-06-04 LAB — CBC WITH DIFFERENTIAL/PLATELET
BASOS ABS: 0 10*3/uL (ref 0.0–0.1)
Basophils Relative: 1 %
Eosinophils Absolute: 0.5 10*3/uL (ref 0.0–0.7)
Eosinophils Relative: 5 %
HCT: 23.9 % — ABNORMAL LOW (ref 39.0–52.0)
Hemoglobin: 8.6 g/dL — ABNORMAL LOW (ref 13.0–17.0)
LYMPHS PCT: 20 %
Lymphs Abs: 1.8 10*3/uL (ref 0.7–4.0)
MCH: 29.7 pg (ref 26.0–34.0)
MCHC: 36 g/dL (ref 30.0–36.0)
MCV: 82.4 fL (ref 78.0–100.0)
Monocytes Absolute: 1.6 10*3/uL — ABNORMAL HIGH (ref 0.1–1.0)
Monocytes Relative: 18 %
Neutro Abs: 4.8 10*3/uL (ref 1.7–7.7)
Neutrophils Relative %: 56 %
PLATELETS: 204 10*3/uL (ref 150–400)
RBC: 2.9 MIL/uL — ABNORMAL LOW (ref 4.22–5.81)
RDW: 13.3 % (ref 11.5–15.5)
WBC: 8.6 10*3/uL (ref 4.0–10.5)

## 2016-06-04 LAB — BASIC METABOLIC PANEL
Anion gap: 12 (ref 5–15)
BUN: 5 mg/dL — AB (ref 6–20)
CALCIUM: 8.9 mg/dL (ref 8.9–10.3)
CHLORIDE: 77 mmol/L — AB (ref 101–111)
CO2: 28 mmol/L (ref 22–32)
CREATININE: 0.91 mg/dL (ref 0.61–1.24)
GFR calc non Af Amer: >60 mL/min (ref >60)
GLUCOSE: 87 mg/dL (ref 65–99)
Potassium: 3.1 mmol/L — ABNORMAL LOW (ref 3.5–5.1)
Sodium: 117 mmol/L — CL (ref 135–145)

## 2016-06-04 NOTE — Pre-Procedure Instructions (Signed)
    [name]  06/04/2016   Your procedure is scheduled on THURSDAY, FEBRUARY 22.  Report to [facility] Admitting at 5:30 AM                Your surgery or procedure is scheduled for 7:30 AM   Call this number if you have problems the morning of surgery: [phone]              For any other questions, please call [phone], Monday - Friday 8 AM - 4 PM.                   Drink lots of Fluids on your Prep Day, to prevent dehydration.              Remember:  Do not eat food or drink liquids after midnight Wednesday, February 21.  Take these medicines the morning of surgery with A SIP OF WATER :oxymorphone (OPANA ER).  May use Albuterol Inhaler and bring it with you to the hospital.                  May take ALPRAZolam Duanne Moron), acetaminophen (TYLENOL) if needed.   Do not wear jewelry, make-up or nail polish.  Do not wear lotions, powders, or perfumes, or deodorant.  Men may shave face and neck.  Do not bring valuables to the hospital.  [facility] is not responsible for any belongings or valuables.  Contacts, dentures or bridgework may not be worn into surgery.  Leave your suitcase in the car.  After surgery it may be brought to your room.  For patients admitted to the hospital, discharge time will be determined by your treatment team.  Special instructions:  Review  [place] - Preparing For Surgery.  Please read over the following fact sheets that you were given. Review  [place] - Preparing For Surgery, Coughing and Deep Breathing and Pain Booklet

## 2016-06-04 NOTE — Progress Notes (Signed)
Sodium 117, Hemoglobin 8.2 Chloride 77.  Labs called to [facility] at Dr Richarda Blade office.

## 2016-06-04 NOTE — Progress Notes (Signed)
There is a order to encourage patient to drink lots of clear liquids the prep day to prevent dehydration.  [name] said that they did not know anything about a prep.  I call Dr Richarda Blade office and spoke with [name] who said patient's record says patient does not need a colon prep. [name] said that he "diesd 4 times while he was here for surgery in Oct 2017.  I asked patient and his wife what  he meant when he said he died, they did not know, I guess my blood pressure dropped real low.  [name] was not told that his heart or breathing stopped.

## 2016-06-05 ENCOUNTER — Inpatient Hospital Stay (HOSPITAL_COMMUNITY)
Admission: EM | Admit: 2016-06-05 | Discharge: 2016-06-12 | DRG: 629 | Disposition: A | Discharge status: Home / Self Care | Payer: Worker's Compensation | Attending: Internal Medicine | Admitting: Internal Medicine

## 2016-06-05 ENCOUNTER — Encounter (HOSPITAL_COMMUNITY): Payer: Self-pay | Admitting: *Deleted

## 2016-06-05 ENCOUNTER — Emergency Department (HOSPITAL_COMMUNITY): Payer: Worker's Compensation

## 2016-06-05 DIAGNOSIS — R935 Abnormal findings on diagnostic imaging of other abdominal regions, including retroperitoneum: Secondary | ICD-10-CM

## 2016-06-05 DIAGNOSIS — Z79899 Other long term (current) drug therapy: Secondary | ICD-10-CM

## 2016-06-05 DIAGNOSIS — K769 Liver disease, unspecified: Secondary | ICD-10-CM | POA: Diagnosis present

## 2016-06-05 DIAGNOSIS — Z932 Ileostomy status: Secondary | ICD-10-CM

## 2016-06-05 DIAGNOSIS — E440 Moderate protein-calorie malnutrition: Secondary | ICD-10-CM | POA: Diagnosis present

## 2016-06-05 DIAGNOSIS — Z91018 Allergy to other foods: Secondary | ICD-10-CM

## 2016-06-05 DIAGNOSIS — Z91040 Latex allergy status: Secondary | ICD-10-CM

## 2016-06-05 DIAGNOSIS — E871 Hypo-osmolality and hyponatremia: Secondary | ICD-10-CM

## 2016-06-05 DIAGNOSIS — D500 Iron deficiency anemia secondary to blood loss (chronic): Secondary | ICD-10-CM | POA: Diagnosis present

## 2016-06-05 DIAGNOSIS — Z888 Allergy status to other drugs, medicaments and biological substances status: Secondary | ICD-10-CM

## 2016-06-05 DIAGNOSIS — F1120 Opioid dependence, uncomplicated: Secondary | ICD-10-CM | POA: Diagnosis present

## 2016-06-05 DIAGNOSIS — G8929 Other chronic pain: Secondary | ICD-10-CM | POA: Diagnosis present

## 2016-06-05 DIAGNOSIS — Z881 Allergy status to other antibiotic agents status: Secondary | ICD-10-CM

## 2016-06-05 DIAGNOSIS — E876 Hypokalemia: Principal | ICD-10-CM | POA: Diagnosis present

## 2016-06-05 DIAGNOSIS — Z6822 Body mass index (BMI) 22.0-22.9, adult: Secondary | ICD-10-CM

## 2016-06-05 DIAGNOSIS — Z87891 Personal history of nicotine dependence: Secondary | ICD-10-CM

## 2016-06-05 DIAGNOSIS — K660 Peritoneal adhesions (postprocedural) (postinfection): Secondary | ICD-10-CM | POA: Diagnosis present

## 2016-06-05 DIAGNOSIS — M21372 Foot drop, left foot: Secondary | ICD-10-CM | POA: Diagnosis present

## 2016-06-05 DIAGNOSIS — Z4659 Encounter for fitting and adjustment of other gastrointestinal appliance and device: Secondary | ICD-10-CM

## 2016-06-05 LAB — URINALYSIS, ROUTINE W REFLEX MICROSCOPIC
Bilirubin Urine: NEGATIVE
Glucose, UA: NEGATIVE mg/dL
Hgb urine dipstick: NEGATIVE
Ketones, ur: NEGATIVE mg/dL
Leukocytes, UA: NEGATIVE
Nitrite: NEGATIVE
Protein, ur: NEGATIVE mg/dL
Specific Gravity, Urine: 1 — ABNORMAL LOW (ref 1.005–1.030)
pH: 7 (ref 5.0–8.0)

## 2016-06-05 LAB — COMPREHENSIVE METABOLIC PANEL
ALT: 21 U/L (ref 17–63)
AST: 32 U/L (ref 15–41)
Albumin: 4 g/dL (ref 3.5–5.0)
Alkaline Phosphatase: 99 U/L (ref 38–126)
Anion gap: 13 (ref 5–15)
BUN: <5 mg/dL — ABNORMAL LOW (ref 6–20)
CHLORIDE: 82 mmol/L — AB (ref 101–111)
CO2: 27 mmol/L (ref 22–32)
Calcium: 9.1 mg/dL (ref 8.9–10.3)
Creatinine, Ser: 1.01 mg/dL (ref 0.61–1.24)
Glucose, Bld: 130 mg/dL — ABNORMAL HIGH (ref 65–99)
Potassium: 2.6 mmol/L — CL (ref 3.5–5.1)
Sodium: 122 mmol/L — ABNORMAL LOW (ref 135–145)
Total Bilirubin: 0.4 mg/dL (ref 0.3–1.2)
Total Protein: 7.1 g/dL (ref 6.5–8.1)

## 2016-06-05 LAB — CBC
HEMATOCRIT: 26.2 % — AB (ref 39.0–52.0)
Hemoglobin: 9.4 g/dL — ABNORMAL LOW (ref 13.0–17.0)
MCH: 29.7 pg (ref 26.0–34.0)
MCHC: 35.9 g/dL (ref 30.0–36.0)
MCV: 82.9 fL (ref 78.0–100.0)
PLATELETS: 222 10*3/uL (ref 150–400)
RBC: 3.16 MIL/uL — AB (ref 4.22–5.81)
RDW: 13.3 % (ref 11.5–15.5)
WBC: 8 10*3/uL (ref 4.0–10.5)

## 2016-06-05 LAB — NA AND K (SODIUM & POTASSIUM), RAND UR
Potassium Urine: 11 mmol/L
Sodium, Ur: <10 mmol/L

## 2016-06-05 LAB — TYPE AND SCREEN
ABO/RH(D): A NEG
Antibody Screen: NEGATIVE

## 2016-06-05 MED ORDER — HYDROMORPHONE HCL 2 MG/ML IJ SOLN
1.0000 mg | Freq: Once | INTRAMUSCULAR | Status: AC
  Administered 2016-06-05: 1 mg via INTRAVENOUS
  Filled 2016-06-05: qty 1

## 2016-06-05 MED ORDER — SODIUM CHLORIDE 0.9 % IV SOLN
30.0000 meq | Freq: Once | INTRAVENOUS | Status: AC
  Administered 2016-06-05: 30 meq via INTRAVENOUS
  Filled 2016-06-05: qty 15

## 2016-06-05 MED ORDER — HYDROMORPHONE HCL 2 MG/ML IJ SOLN: 1.0000 mg | Freq: Once | INTRAMUSCULAR | Status: DC

## 2016-06-05 MED ORDER — IOPAMIDOL (ISOVUE-300) INJECTION 61%
INTRAVENOUS | Status: AC
  Administered 2016-06-05: 100 mL
  Filled 2016-06-05: qty 100

## 2016-06-05 MED ORDER — LOPERAMIDE HCL 2 MG PO CAPS
8.0000 mg | ORAL_CAPSULE | Freq: Four times a day (QID) | ORAL | Status: DC
  Administered 2016-06-05 – 2016-06-06 (×5): 8 mg via ORAL
  Filled 2016-06-05 (×5): qty 4

## 2016-06-05 MED ORDER — SODIUM CHLORIDE 0.9 % IV BOLUS (SEPSIS)
1000.0000 mL | Freq: Once | INTRAVENOUS | Status: AC
  Administered 2016-06-05: 1000 mL via INTRAVENOUS

## 2016-06-05 MED ORDER — SODIUM CHLORIDE 0.9 % IV SOLN
Freq: Once | INTRAVENOUS | Status: AC
  Administered 2016-06-06: 01:00:00 via INTRAVENOUS
  Filled 2016-06-05: qty 1000

## 2016-06-05 NOTE — ED Notes (Signed)
Patient transported to CT 

## 2016-06-05 NOTE — H&P (Signed)
History and Physical    [name] [id number] DOB: 08-26-1965 DOA: 06/05/2016  PCP: No primary care provider on file.   Patient coming from: Home  Chief Complaint: Advised to come to the ED for abnormal outpatient labs  HPI: [name] is a 51 y.o. 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  He also has a history of chronic pain with narcotic dependence and recurrent electrolyte abnormalities.  He reported for outpatient labs yesterday as part of his pre-op evaluation for planned ostomy Reversal.  He was found to have low sodium and potassium levels.  He was told to come to the ED for management.  Of note, he is supposed to be on an oral sodium/potassium supplement at home.  While in the ED he reported new pain in his lower abdomen that started in the past 1-2 days after sneezing.  He now has a palpable mass/area of herniation to the right of his midline abdominal incision.  No fevers.  He has had chills but no sweats.  He has intermittent light-headedness (which is not new); no LOC.  No chest pain or shortness of breath.  No nausea or vomiting.  Appetite has been stable.  ED Course: Sodium 122.  Potassium 2.6.  Outpatient EKG showed a NSR.  Hgb 9.4; appears stable.  CT abdomen pelvis obtained and showed abnormal appearance of the inferior rectus muscles with subcutaneous edema and midline soft tissue thickening (differential: post-surgical changes, infection, phlegmon).  No frank abscess.  Also there are multiple indeterminate lesions in the liver (present in Oct/Nov of 2017 as well; patient says that he was not aware of theses results).  Some lesion appear to have increased in size.  Differential includes infection, neoplasm.  MRI recommended.  ED attending page  General Surgery regarding the abnormal CT results.  Hospitalist asked to admit for electrolyte management.  Review of Systems: As per HPI otherwise 10 point review of systems negative.    Past Medical History:  Diagnosis Date  . Anxiety    panic attacks  . Asthma   . Complication of anesthesia    " I got confused "  . Foot drop, left   . Headache    Migraine  . History of blood transfusion   . Ischemic bowel disease ([place])   . Pneumonia    in past  . Septic shock ([place]) 01/2016  . Small bowel perforation ([place]) 01/2016    Past Surgical History:  Procedure Laterality Date  . APPLICATION OF WOUND VAC N/A 02/08/2016   Procedure: APPLICATION OF WOUND VAC;  Surgeon: Judeth Horn, MD;  Location: [place];  Service: General;  Laterality: N/A;  . BACK SURGERY     x3  . BOWEL RESECTION N/A 01/27/2016   Procedure: SMALL BOWEL RESECTION;  Surgeon: Greer Pickerel, MD;  Location: [place];  Service: General;  Laterality: N/A;  . EXPLORATORY LAPAROTOMY  01/27/2016  . ILEOCECETOMY N/A 02/08/2016   Procedure: Pennie Rushing;  Surgeon: Judeth Horn, MD;  Location: [place];  Service: General;  Laterality: N/A;  . ILEOSTOMY N/A 02/10/2016   Procedure: ILEOSTOMY;  Surgeon: Judeth Horn, MD;  Location: [place];  Service: General;  Laterality: N/A;  . LAPAROTOMY N/A 01/27/2016   Procedure: EXPLORATORY LAPAROTOMY;  Surgeon: Greer Pickerel, MD;  Location: [place];  Service: General;  Laterality: N/A;  . LAPAROTOMY N/A 02/08/2016   Procedure: EXPLORATORY LAPAROTOMY;  Surgeon: Judeth Horn, MD;  Location: [place];  Service: General;  Laterality: N/A;  . LAPAROTOMY N/A 02/10/2016   Procedure: EXPLORATORY LAPAROTOMY;  Surgeon: Judeth Horn, MD;  Location: [place];  Service: General;  Laterality: N/A;  . VACUUM ASSISTED CLOSURE CHANGE N/A 02/12/2016   Procedure: EXPLORATORY LAPAROTOMY WITH CLOSURE;  Surgeon: Erroll Luna, MD;  Location: [place];  Service: General;  Laterality: N/A;     reports that he quit smoking about 4 months  ago. His smoking use included Cigarettes. He has a 17.00 pack-year smoking history. He has never used smokeless tobacco. He reports that he does not drink alcohol or use drugs.  Allergies  Allergen Reactions  . Celecoxib Other (See Comments)    Other reaction(s): IRRITABILITY  . Clonazepam Itching  . Cyclobenzaprine Other (See Comments)    Other reaction(s): IRRITABILITY  . Dexamethasone Other (See Comments)    Other reaction(s): IRRITABILITY  . Diazepam Other (See Comments)    Other reaction(s): IRRITABILITY  . Etodolac Other (See Comments)    Other reaction(s): IRRITABILITY  . Gabapentin Other (See Comments)    Other reaction(s): IRRITABILITY  . Metaxalone Other (See Comments)    Other reaction(s): IRRITABILITY  . Methocarbamol Other (See Comments)    Other reaction(s): IRRITABILITY  . Pregabalin Other (See Comments)    Other reaction(s): IRRITABILITY  . Propoxyphene Other (See Comments)    Other reaction(s): IRRITABILITY  . Tramadol Other (See Comments)    Other reaction(s): IRRITABILITY  . Latex Itching and Rash  . Tape Rash and Other (See Comments)    PLASTIC TAPE TAKES OFF 1-2 LAYERS OF [name] SKIN    FAMILY HISTORY: Mother is deceased.  She had metastatic cancer (primary unclear). Father has dementia.  He is still alive.  Prior to Admission medications   Medication Sig Start Date End Date Taking? Authorizing Provider  acetaminophen (TYLENOL) 325 MG tablet Take 325-650 mg by mouth every 6 (six) hours as needed for headache.    Yes Historical Provider, MD  albuterol (PROVENTIL HFA;VENTOLIN HFA) 108 (90 Base) MCG/ACT inhaler Inhale 2 puffs into the lungs every 6 (six) hours as needed for wheezing or shortness of breath.   Yes Historical Provider, MD  ALPRAZolam Duanne Moron) 0.5 MG tablet Take 0.25-0.5 mg by mouth 3 (three) times daily as needed for anxiety or sleep.    Yes Historical Provider, MD  diphenoxylate-atropine (LOMOTIL) 2.5-0.025 MG tablet Take 2 tablets by mouth  4 (four) times daily. 03/04/16  Yes Maryann Mikhail, DO  ferrous sulfate 325 (65 FE) MG tablet Take 1 tablet (325 mg total) by mouth 3 (three) times daily with meals. 03/04/16  Yes Maryann Mikhail, DO  furosemide (LASIX) 40 MG tablet Take 40 mg by mouth daily as needed for fluid or edema.  03/22/16  Yes Historical Provider, MD  loperamide (IMODIUM) 2 MG capsule Take 4 capsules (8 mg total) by mouth 4 (four) times daily. 03/04/16  Yes Maryann Mikhail, DO  Multiple Vitamin (MULTIVITAMIN WITH MINERALS) TABS tablet Take 1 tablet by mouth daily.   Yes Historical Provider, MD  oxymorphone (OPANA ER) 40 MG 12 hr tablet Take 40 mg by mouth 3 (three) times daily.   Yes Historical Provider, MD  paregoric 2 MG/5ML solution Take 5 mLs by mouth as needed for diarrhea or loose stools. 03/04/16  Yes Maryann Mikhail, DO  potassium & sodium phosphates (PHOS-NAK) 280-160-250 MG PACK Take 1 packet by mouth 4 (  four) times daily. 03/04/16  Yes Maryann Mikhail, DO  psyllium (HYDROCIL/METAMUCIL) 95 % PACK Take 2 packets by mouth 3 (three) times daily. Patient taking differently: Take 1 packet by mouth 4 (four) times daily.  03/04/16  Yes Maryann Mikhail, DO  vitamin C (ASCORBIC ACID) 500 MG tablet Take 500 mg by mouth daily.   Yes Historical Provider, MD  pantoprazole (PROTONIX) 40 MG tablet Take 1 tablet (40 mg total) by mouth daily. Patient not taking: Reported on 06/05/2016 03/05/16   Cristal Ford, DO    Physical Exam: Vitals:   06/05/16 1923 06/05/16 2000 06/05/16 2100 06/05/16 2300  BP: 114/66 119/65 113/74 118/62  Pulse: 75 74 79 73  Resp: 16 22 18 18   Temp: 99.1 F (37.3 C)     TempSrc: Oral     SpO2: 100% 100% 100% 100%      Constitutional: NAD, calm, comfortable Vitals:   06/05/16 1923 06/05/16 2000 06/05/16 2100 06/05/16 2300  BP: 114/66 119/65 113/74 118/62  Pulse: 75 74 79 73  Resp: 16 22 18 18   Temp: 99.1 F (37.3 C)     TempSrc: Oral     SpO2: 100% 100% 100% 100%   Eyes: PERRL, lids  and conjunctivae normal ENMT: Mucous membranes are moist. Posterior pharynx clear of any exudate or lesions. Normal dentition.  Neck: normal appearance, supple, no masses Respiratory: clear to auscultation bilaterally, no wheezing, no crackles. Normal respiratory effort. No accessory muscle use.  Cardiovascular: Normal rate, regular rhythm, no murmurs / rubs / gallops. No extremity edema. 2+ pedal pulses. GI: abdomen is soft and compressible.  Very thin.  Ostomy to RLQ.  Midline scar.  Small palpable ?reducible mass noted to the right of this scar.  Mild tenderness.  No distention.  Bowel sounds are present. Musculoskeletal:  No joint deformity in upper and lower extremities. Good ROM, no contractures. Normal muscle tone.  Skin: no rashes, warm and dry Neurologic: CN 2-12 grossly intact. Sensation intact, Strength symmetric bilaterally. Psychiatric: Normal judgment and insight. Alert and oriented x 3. Normal mood.     Labs on Admission: I have personally reviewed following labs and imaging studies  CBC:  Recent Labs Lab 06/04/16 1402 06/05/16 1621  WBC 8.6 8.0  NEUTROABS 4.8  --   HGB 8.6* 9.4*  HCT 23.9* 26.2*  MCV 82.4 82.9  PLT 204 [id number]   Basic Metabolic Panel:  Recent Labs Lab 06/04/16 1402 06/05/16 1621  NA 117* 122*  K 3.1* 2.6*  CL 77* 82*  CO2 28 27  GLUCOSE 87 130*  BUN 5* <5*  CREATININE 0.91 1.01  CALCIUM 8.9 9.1   GFR: Estimated Creatinine Clearance: 87.5 mL/min (by C-G formula based on SCr of 1.01 mg/dL). Liver Function Tests:  Recent Labs Lab 06/05/16 1621  AST 32  ALT 21  ALKPHOS 99  BILITOT 0.4  PROT 7.1  ALBUMIN 4.0   Urine analysis:    Component Value Date/Time   COLORURINE COLORLESS (A) 06/05/2016 2122   APPEARANCEUR CLEAR 06/05/2016 2122   LABSPEC 1.000 (L) 06/05/2016 2122   PHURINE 7.0 06/05/2016 2122   GLUCOSEU NEGATIVE 06/05/2016 2122   HGBUR NEGATIVE 06/05/2016 2122   BILIRUBINUR NEGATIVE 06/05/2016 2122   KETONESUR NEGATIVE  06/05/2016 2122   PROTEINUR NEGATIVE 06/05/2016 2122   UROBILINOGEN 1.0 07/28/2007 1539   NITRITE NEGATIVE 06/05/2016 2122   LEUKOCYTESUR NEGATIVE 06/05/2016 2122   Sepsis Labs:  Lactic acid level pending  Radiological Exams on Admission: Chest 2 View  Result Date:  06/04/2016 CLINICAL DATA:  Ileostomy takedown.  CHF. EXAM: CHEST  2 VIEW COMPARISON:  February 29, 2016 FINDINGS: No pneumothorax. The cardiomediastinal silhouette is normal. Persistent opacity in the left lung base continues to improve. The linear nature suggests a component of atelectasis and/or scar. No other interval changes or acute abnormalities. IMPRESSION: Continued improvement of the left lower lobe opacity. I favor at least a component of this opacity is atelectasis or scar. Electronically Signed   By: Dorise Bullion III M.D   On: 06/04/2016 18:43   Ct Abdomen Pelvis W Contrast  Result Date: 06/05/2016 CLINICAL DATA:  Follow-up incarcerated ventral hernia distal to the ostomy EXAM: CT ABDOMEN AND PELVIS WITH CONTRAST TECHNIQUE: Multidetector CT imaging of the abdomen and pelvis was performed using the standard protocol following bolus administration of intravenous contrast. CONTRAST:  100 mL Isovue-300 intravenous COMPARISON:  02/25/2016 FINDINGS: Lower chest: Linear scarring in the left lower lobe. No acute infiltrate or effusion at the bilateral lung bases. The heart is nonenlarged. Hepatobiliary: Multiple hypodense liver lesions are again visualized with slight increase in size of several lesions 8 lateral segment left hepatic lobe lesion measures 2.2 by 2 cm compared with 2.1 x 2.1 cm previously and demonstrates patchy internal enhancement. A lesion in the posterior right hepatic lobe measures 1 cm, compared with 7 mm previously. An irregular hypodense lesion in the medial segment of the left hepatic lobe is not clearly identified on the current study. No calcified gallstones. No biliary dilatation. Pancreas: Unremarkable.  No pancreatic ductal dilatation or surrounding inflammatory changes. Spleen: Normal in size without focal abnormality. Adrenals/Urinary Tract: Adrenal glands are unremarkable. Kidneys are normal, without renal calculi, focal lesion, or hydronephrosis. Bladder is unremarkable. Stomach/Bowel: There is a right lower quadrant ostomy. Patient is status post ileocecal and distal small bowel resection. No parastomal hernia is visualized. There is no evidence for a small bowel obstruction. Collapsed blind ending large bowel with mild wall thickening as before. Vascular/Lymphatic: Aortic atherosclerosis. Stable multiple mildly enlarged upper abdominal lymph nodes. Reproductive: Prostate is unremarkable. Other: There is no free air or free fluid. Soft tissue thickening and edema/ inflammatory response around the right greater than left rectus sheath. Midline soft tissue thickening situated between the rectus muscles below the level of the umbilicus. No evidence for a soft tissue abscess. Musculoskeletal: Stable postoperative changes at L3-L4 and L4-L5. Additional lateral surgical plate and screw fixation partially visualized at T10. IMPRESSION: 1. Status post the ileocecal and distal small bowel resection with right lower quadrant ileostomy. No parastomal hernia is visualized. There is no bowel containing ventral hernia identified. 2. Slightly enlarged appearance of the inferior rectus muscles with subcutaneous edema and focal midline soft tissue thickening along the midline infra umbilical abdominal wall, this could relate to postsurgical changes, or possibly infection/ phlegmon. There is some fluid around the right rectus muscle. There is no soft tissue abscess. 3. Multiple indeterminate low-density lesions in the liver, several of which have increased in size. Findings could be secondary to multifocal infection with neoplastic etiology not excluded. As previously recommended, nonemergent MRI follow-up may be performed when  clinically feasible. Electronically Signed   By: Donavan Foil M.D.   On: 06/05/2016 22:32    EKG: Independently reviewed. NSR.  Assessment/Plan Principal Problem:   Hypokalemia Active Problems:   Hyponatremia   Abnormal CT of the abdomen      Hypovolemic hyponatremia, hypokalemia secondary to excessive GI losses --Hydrate with NS --IV potassium replacement in NS.  63mEq ordered in  the ED.  Additional 26mEq ordered now. --Continue oral supplement --Check mag level with AM BMP  Abnormal findings on CT --General Surgery is supposed to comment --No signs of sepsis at this point --Lactic acid requested --Monitor abdominal exam  Abnormal liver lesions --Needs MRI of liver at some point.  I did not order yet since he is in observation status but may be prudent to have more information before the patient goes back to the OR.  The patient and his wife are aware that he needs this follow-up imaging.  History of small bowel resection, ostomy --Due for ostomy reversal later this week --Continue scheduled imodium, lomotil  Chronic anemia --Continue iron supplement  Chronic pain --MS contin substitute for Opana  Anxiety --Xanax prn    DVT prophylaxis: Low risk, early ambulation Code Status: FULL Family Communication: Two family members present in the ED at the time of admission. Disposition Plan: To be determined. Consults called: General Surgery Admission status: Place in observation with telemetry monitoring.   TIME SPENT: 60 minutes   Eber Jones MD [facility] Pager [phone]  If 7PM-7AM, please contact night-coverage [email or web address] Password [facility]  06/05/2016, 11:47 PM

## 2016-06-05 NOTE — Progress Notes (Signed)
Anesthesia Chart Review: Patient is a 51 year old male scheduled for ileostomy takedown on 06/07/16 by Dr. Hulen Skains.  History includes T9-10 laminectomy 12/05/06, left thoracotomy for T8-9 and T9-10 discectomy/fusion 07/31/07, ALIF L3-5 04/27/08, perforated viscus s/p exploratory laparotomy with ileocecectomy [id number] complicated by ileocolonic anastomosis leak with peritonitis (readmitted 02/08/16) s/p exploratory laparotomy with ileocecectomy and application wound VAC 0000000 s/p exploration expiratory laparotomy with ileostomy 02/10/2016 and removal of VAC with closure 02/12/2016. Patient hospitalized 01/27/16-02/02/16 for abdominal pain and perforated viscus s/p ileocecectomy. Readmitted 02/08/16-03/04/16 for ischemic bowel with anastomotic leak. He developed septic shock and required intubation through 02/13/16. ID saw for antibiotic recommendations. He underwent multiple surgeries as above and required PRBC transufsion for anemia.   Meds include albuterol, Xanax, ferrous sulfate, Lasix, Opana ER, Protonix, Phos-NAK, paregoric, vitamin C.  PCP: None specified. GI is Dr. Watt Climes.  BP 121/72   Pulse 64   Temp 36.7 C   Resp 18   Ht 5\' 9"  (1.753 m)   Wt 171 lb 3.2 oz (77.7 kg)   SpO2 100%   BMI 25.28 kg/m   EKG 06/04/16: NSR.  CXR 2/29/18: IMPRESSION: Continued improvement of the left lower lobe opacity. I favor at least a component of this opacity is atelectasis or scar (Dr. Dorise Bullion, III).  Preoperative labs noted. Na 117 (critical), Cl  77. H/H 8.6/23.9 (down from 9.0/27.6 on 03/04/16). PLT 204. Glucose 87. Cr 0.91. (PAT RN Jan called Na, Cl, H/H to [place] at [place] on 06/05/16.) I called [facility] triage RN Abigail Butts. She will page Dr. Hulen Skains to follow-up if he received labs and inquire about management plan.  [name] [facility] Short Stay Center/Anesthesiology Phone [phone] 06/05/2016 12:39 PM

## 2016-06-05 NOTE — ED Provider Notes (Signed)
[facility] DEPT Provider Note   CSN: [id number] Arrival date & time: 06/05/16  1612     History   Chief Complaint Chief Complaint  Patient presents with  . Abnormal Lab    HPI [name] is a 51 y.o. male.  HPI   Patient is a 51 year old male with complex past medical history including perforated bowel with ostomy. He has plan to have her ostomy revision next week. He got preop labs showing a sodium of 117 sent here for evaluation.  Patient has not had any changes in medication recently. Has noticed increased ostomy output. Does have symptoms of feeling unsteady, fatigued.  Past Medical History:  Diagnosis Date  . Anxiety    panic attacks  . Asthma   . Complication of anesthesia    " I got confused "  . Foot drop, left   . Headache    Migraine  . History of blood transfusion   . Ischemic bowel disease ([place])   . Pneumonia    in past  . Septic shock ([place]) 01/2016  . Small bowel perforation ([place]) 01/2016    Patient Active Problem List   Diagnosis Date Noted  . Hypokalemia 06/05/2016  . Ischemic bowel disease ([place])   . Bowel perforation ([place])   . Abdominal abscess ([place])   . Pleural effusion   . Acute kidney injury ([place])   . Sepsis due to Escherichia coli ([place])   . Sepsis ([place])   . Abdominal infection ([place]) 02/08/2016  . Peritonitis ([place])   . Small bowel perforation ([place]) 01/27/2016    Past Surgical History:  Procedure Laterality Date  . APPLICATION OF WOUND VAC N/A 02/08/2016   Procedure: APPLICATION OF WOUND VAC;  Surgeon: Judeth Horn, MD;  Location: [facility];  Service: General;  Laterality: N/A;  . BACK SURGERY     x3  . BOWEL RESECTION N/A 01/27/2016   Procedure: SMALL BOWEL RESECTION;  Surgeon: Greer Pickerel, MD;  Location: [facility];  Service: General;  Laterality: N/A;  . EXPLORATORY LAPAROTOMY  01/27/2016  . ILEOCECETOMY N/A 02/08/2016   Procedure: Pennie Rushing;  Surgeon: Judeth Horn, MD;  Location: [facility];  Service: General;  Laterality: N/A;  .  ILEOSTOMY N/A 02/10/2016   Procedure: ILEOSTOMY;  Surgeon: Judeth Horn, MD;  Location: [facility];  Service: General;  Laterality: N/A;  . LAPAROTOMY N/A 01/27/2016   Procedure: EXPLORATORY LAPAROTOMY;  Surgeon: Greer Pickerel, MD;  Location: [facility];  Service: General;  Laterality: N/A;  . LAPAROTOMY N/A 02/08/2016   Procedure: EXPLORATORY LAPAROTOMY;  Surgeon: Judeth Horn, MD;  [facility];  Service: General;  Laterality: N/A;  . LAPAROTOMY N/A 02/10/2016   Procedure: EXPLORATORY LAPAROTOMY;  Surgeon: Judeth Horn, MD;  Location: [facility];  Service: General;  Laterality: N/A;  . VACUUM ASSISTED CLOSURE CHANGE N/A 02/12/2016   Procedure: EXPLORATORY LAPAROTOMY WITH CLOSURE;  Surgeon: Erroll Luna, MD;  Location: [facility];  Service: General;  Laterality: N/A;       Home Medications    Prior to Admission medications   Medication Sig Start Date End Date Taking? Authorizing Provider  acetaminophen (TYLENOL) 325 MG tablet Take 325-650 mg by mouth every 6 (six) hours as needed for headache.    Yes Historical Provider, MD  albuterol (PROVENTIL HFA;VENTOLIN HFA) 108 (90 Base) MCG/ACT inhaler Inhale 2 puffs into the lungs every 6 (six) hours as needed for wheezing or shortness of breath.   Yes Historical Provider, MD  ALPRAZolam Duanne Moron) 0.5 MG tablet Take 0.25-0.5 mg  by mouth 3 (three) times daily as needed for anxiety or sleep.    Yes Historical Provider, MD  diphenoxylate-atropine (LOMOTIL) 2.5-0.025 MG tablet Take 2 tablets by mouth 4 (four) times daily. 03/04/16  Yes Maryann Mikhail, DO  ferrous sulfate 325 (65 FE) MG tablet Take 1 tablet (325 mg total) by mouth 3 (three) times daily with meals. 03/04/16  Yes Maryann Mikhail, DO  furosemide (LASIX) 40 MG tablet Take 40 mg by mouth daily as needed for fluid or edema.  03/22/16  Yes Historical Provider, MD  loperamide (IMODIUM) 2 MG capsule Take 4 capsules (8 mg total) by mouth 4 (four) times daily. 03/04/16  Yes Maryann Mikhail, DO  Multiple Vitamin  (MULTIVITAMIN WITH MINERALS) TABS tablet Take 1 tablet by mouth daily.   Yes Historical Provider, MD  oxymorphone (OPANA ER) 40 MG 12 hr tablet Take 40 mg by mouth 3 (three) times daily.   Yes Historical Provider, MD  paregoric 2 MG/5ML solution Take 5 mLs by mouth as needed for diarrhea or loose stools. 03/04/16  Yes Maryann Mikhail, DO  potassium & sodium phosphates (PHOS-NAK) 280-160-250 MG PACK Take 1 packet by mouth 4 (four) times daily. 03/04/16  Yes Maryann Mikhail, DO  psyllium (HYDROCIL/METAMUCIL) 95 % PACK Take 2 packets by mouth 3 (three) times daily. Patient taking differently: Take 1 packet by mouth 4 (four) times daily.  03/04/16  Yes Maryann Mikhail, DO  vitamin C (ASCORBIC ACID) 500 MG tablet Take 500 mg by mouth daily.   Yes Historical Provider, MD  pantoprazole (PROTONIX) 40 MG tablet Take 1 tablet (40 mg total) by mouth daily. Patient not taking: Reported on 06/05/2016 03/05/16   Cristal Ford, DO    Family History History reviewed. No pertinent family history.  Social History Social History  Substance Use Topics  . Smoking status: Former Smoker    Packs/day: 0.50    Years: 34.00    Types: Cigarettes    Quit date: 01/27/2016  . Smokeless tobacco: Never Used  . Alcohol use No     Allergies   Celecoxib; Clonazepam; Cyclobenzaprine; Dexamethasone; Diazepam; Etodolac; Gabapentin; Metaxalone; Methocarbamol; Pregabalin; Propoxyphene; Tramadol; Latex; and Tape   Review of Systems Review of Systems  Constitutional: Negative for fatigue and fever.  Gastrointestinal: Positive for abdominal pain.  Musculoskeletal: Positive for back pain.  Neurological: Negative for dizziness and weakness.  All other systems reviewed and are negative.    Physical Exam Updated Vital Signs BP 118/62   Pulse 73   Temp 99.1 F (37.3 C) (Oral)   Resp 18   SpO2 100%   Physical Exam  Constitutional: He is oriented to person, place, and time. He appears well-nourished.  Temporal  wasting  HENT:  Head: Normocephalic.  Eyes: Conjunctivae are normal.  Cardiovascular: Normal rate and regular rhythm.   No murmur heard. Pulmonary/Chest: Effort normal and breath sounds normal. No respiratory distress.  Abdominal:  Ostomy present. Patient has bulge directly distal that not reducible. No surrounding skin changes.  Neurological: He is oriented to person, place, and time. No cranial nerve deficit. Coordination normal.  Skin: Skin is warm and dry. He is not diaphoretic.  Psychiatric: He has a normal mood and affect. His behavior is normal.     ED Treatments / Results  Labs (all labs ordered are listed, but only abnormal results are displayed) Labs Reviewed  COMPREHENSIVE METABOLIC PANEL - Abnormal; Notable for the following:       Result Value   Sodium 122 (*)  Potassium 2.6 (*)    Chloride 82 (*)    Glucose, Bld 130 (*)    BUN <5 (*)    All other components within normal limits  CBC - Abnormal; Notable for the following:    RBC 3.16 (*)    Hemoglobin 9.4 (*)    HCT 26.2 (*)    All other components within normal limits  URINALYSIS, ROUTINE W REFLEX MICROSCOPIC - Abnormal; Notable for the following:    Color, Urine COLORLESS (*)    Specific Gravity, Urine 1.000 (*)    All other components within normal limits  URINE CULTURE  NA AND K (SODIUM & POTASSIUM), RAND UR  LACTIC ACID, PLASMA  LACTIC ACID, PLASMA  TYPE AND SCREEN    EKG  EKG Interpretation None       Radiology Chest 2 View  Result Date: 06/04/2016 CLINICAL DATA:  Ileostomy takedown.  CHF. EXAM: CHEST  2 VIEW COMPARISON:  February 29, 2016 FINDINGS: No pneumothorax. The cardiomediastinal silhouette is normal. Persistent opacity in the left lung base continues to improve. The linear nature suggests a component of atelectasis and/or scar. No other interval changes or acute abnormalities. IMPRESSION: Continued improvement of the left lower lobe opacity. I favor at least a component of this  opacity is atelectasis or scar. Electronically Signed   By: Dorise Bullion III M.D   On: 06/04/2016 18:43   Ct Abdomen Pelvis W Contrast  Result Date: 06/05/2016 CLINICAL DATA:  Follow-up incarcerated ventral hernia distal to the ostomy EXAM: CT ABDOMEN AND PELVIS WITH CONTRAST TECHNIQUE: Multidetector CT imaging of the abdomen and pelvis was performed using the standard protocol following bolus administration of intravenous contrast. CONTRAST:  100 mL Isovue-300 intravenous COMPARISON:  02/25/2016 FINDINGS: Lower chest: Linear scarring in the left lower lobe. No acute infiltrate or effusion at the bilateral lung bases. The heart is nonenlarged. Hepatobiliary: Multiple hypodense liver lesions are again visualized with slight increase in size of several lesions 8 lateral segment left hepatic lobe lesion measures 2.2 by 2 cm compared with 2.1 x 2.1 cm previously and demonstrates patchy internal enhancement. A lesion in the posterior right hepatic lobe measures 1 cm, compared with 7 mm previously. An irregular hypodense lesion in the medial segment of the left hepatic lobe is not clearly identified on the current study. No calcified gallstones. No biliary dilatation. Pancreas: Unremarkable. No pancreatic ductal dilatation or surrounding inflammatory changes. Spleen: Normal in size without focal abnormality. Adrenals/Urinary Tract: Adrenal glands are unremarkable. Kidneys are normal, without renal calculi, focal lesion, or hydronephrosis. Bladder is unremarkable. Stomach/Bowel: There is a right lower quadrant ostomy. Patient is status post ileocecal and distal small bowel resection. No parastomal hernia is visualized. There is no evidence for a small bowel obstruction. Collapsed blind ending large bowel with mild wall thickening as before. Vascular/Lymphatic: Aortic atherosclerosis. Stable multiple mildly enlarged upper abdominal lymph nodes. Reproductive: Prostate is unremarkable. Other: There is no free air or  free fluid. Soft tissue thickening and edema/ inflammatory response around the right greater than left rectus sheath. Midline soft tissue thickening situated between the rectus muscles below the level of the umbilicus. No evidence for a soft tissue abscess. Musculoskeletal: Stable postoperative changes at L3-L4 and L4-L5. Additional lateral surgical plate and screw fixation partially visualized at T10. IMPRESSION: 1. Status post the ileocecal and distal small bowel resection with right lower quadrant ileostomy. No parastomal hernia is visualized. There is no bowel containing ventral hernia identified. 2. Slightly enlarged appearance of the inferior  rectus muscles with subcutaneous edema and focal midline soft tissue thickening along the midline infra umbilical abdominal wall, this could relate to postsurgical changes, or possibly infection/ phlegmon. There is some fluid around the right rectus muscle. There is no soft tissue abscess. 3. Multiple indeterminate low-density lesions in the liver, several of which have increased in size. Findings could be secondary to multifocal infection with neoplastic etiology not excluded. As previously recommended, nonemergent MRI follow-up may be performed when clinically feasible. Electronically Signed   By: Donavan Foil M.D.   On: 06/05/2016 22:32    Procedures Procedures (including critical care time)  Medications Ordered in ED Medications  potassium chloride 30 mEq in sodium chloride 0.9 % 265 mL (KCL MULTIRUN) IVPB (30 mEq Intravenous Given 06/05/16 2131)  loperamide (IMODIUM) capsule 8 mg (8 mg Oral Given 06/05/16 2131)  HYDROmorphone (DILAUDID) injection 1 mg (1 mg Intravenous Given 06/05/16 2132)  sodium chloride 0.9 % bolus 1,000 mL (1,000 mLs Intravenous New Bag/Given 06/05/16 2132)  iopamidol (ISOVUE-300) 61 % injection (100 mLs  Contrast Given 06/05/16 2149)     Initial Impression / Assessment and Plan / ED Course  I have reviewed the triage vital signs and  the nursing notes.  Pertinent labs & imaging results that were available during my care of the patient were reviewed by me and considered in my medical decision making (see chart for details).    Patient is a 51 year old male with past medical history significant for perforated bowel with ostomy who was planning to have revision this week. Abnormalities noted to his labs that were preoperative tonight. Patient hyponatremic and hypokalemic. Patient was already on sodium/potassium 4 times daily. He reports he's been taking this as prescribed.  Patient  Also  complains of pain him from a inraumbilical area and on palpation on exam there is bulge present. Nonreducible. Will get CT.  11:34 PM CT shows no surgerical emergency.  Discussed with Dr. Donne Hazel, surgery aware of patient's stay.   Pt will require admission given his electrolye abnormality, but since it has been trending in the right direction, it may only be an observation.     Final Clinical Impressions(s) / ED Diagnoses   Final diagnoses:  None    New Prescriptions New Prescriptions   No medications on file     Bonnee Zertuche Julio Alm, MD 06/05/16 2334

## 2016-06-05 NOTE — ED Notes (Signed)
Pt ambulates to the bathroom to void and get a urine sample

## 2016-06-05 NOTE — ED Notes (Signed)
Lab called with Potassium 2.6.  Reported to Dr. Alvino Chapel.

## 2016-06-05 NOTE — ED Triage Notes (Signed)
Pt is scheduled for colostomy reversal on Thursday. Went for pre-op appt yesterday and had blood work done. Was called to come here today due to critical sodium level and hgb was 8.6. Pt has no complaints.

## 2016-06-05 NOTE — ED Notes (Signed)
Pt is concerned about having misses his 5pm meds especially his immodium (4tab of 2mg  immodium) and his output in his colostomy has increased. Pt also has missed his lomotil 2.5-0.025mg  which he takes 2 tab of usually.  Pt is also having increased pain due to having missed his oxymorphone 40mg  tab at 5pm.  Pt went to bathroom to empty colostomy bag, will speak with EDP regarding these meds.

## 2016-06-06 DIAGNOSIS — Z932 Ileostomy status: Secondary | ICD-10-CM | POA: Diagnosis not present

## 2016-06-06 DIAGNOSIS — E876 Hypokalemia: Principal | ICD-10-CM

## 2016-06-06 DIAGNOSIS — E440 Moderate protein-calorie malnutrition: Secondary | ICD-10-CM | POA: Diagnosis present

## 2016-06-06 DIAGNOSIS — E871 Hypo-osmolality and hyponatremia: Secondary | ICD-10-CM

## 2016-06-06 DIAGNOSIS — Z881 Allergy status to other antibiotic agents status: Secondary | ICD-10-CM | POA: Diagnosis not present

## 2016-06-06 DIAGNOSIS — K769 Liver disease, unspecified: Secondary | ICD-10-CM | POA: Diagnosis present

## 2016-06-06 DIAGNOSIS — M21372 Foot drop, left foot: Secondary | ICD-10-CM | POA: Diagnosis present

## 2016-06-06 DIAGNOSIS — K660 Peritoneal adhesions (postprocedural) (postinfection): Secondary | ICD-10-CM | POA: Diagnosis present

## 2016-06-06 DIAGNOSIS — R935 Abnormal findings on diagnostic imaging of other abdominal regions, including retroperitoneum: Secondary | ICD-10-CM

## 2016-06-06 DIAGNOSIS — Z888 Allergy status to other drugs, medicaments and biological substances status: Secondary | ICD-10-CM | POA: Diagnosis not present

## 2016-06-06 DIAGNOSIS — Z79899 Other long term (current) drug therapy: Secondary | ICD-10-CM | POA: Diagnosis not present

## 2016-06-06 DIAGNOSIS — F1120 Opioid dependence, uncomplicated: Secondary | ICD-10-CM | POA: Diagnosis present

## 2016-06-06 DIAGNOSIS — G8929 Other chronic pain: Secondary | ICD-10-CM | POA: Diagnosis present

## 2016-06-06 DIAGNOSIS — Z6822 Body mass index (BMI) 22.0-22.9, adult: Secondary | ICD-10-CM | POA: Diagnosis not present

## 2016-06-06 DIAGNOSIS — D500 Iron deficiency anemia secondary to blood loss (chronic): Secondary | ICD-10-CM | POA: Diagnosis present

## 2016-06-06 DIAGNOSIS — Z91018 Allergy to other foods: Secondary | ICD-10-CM | POA: Diagnosis not present

## 2016-06-06 DIAGNOSIS — Z87891 Personal history of nicotine dependence: Secondary | ICD-10-CM | POA: Diagnosis not present

## 2016-06-06 DIAGNOSIS — Z91040 Latex allergy status: Secondary | ICD-10-CM | POA: Diagnosis not present

## 2016-06-06 LAB — BASIC METABOLIC PANEL
ANION GAP: 10 (ref 5–15)
BUN: <5 mg/dL — ABNORMAL LOW (ref 6–20)
CHLORIDE: 89 mmol/L — AB (ref 101–111)
CO2: 29 mmol/L (ref 22–32)
Calcium: 9.5 mg/dL (ref 8.9–10.3)
Creatinine, Ser: 0.85 mg/dL (ref 0.61–1.24)
GFR calc Af Amer: >60 mL/min (ref >60)
Glucose, Bld: 105 mg/dL — ABNORMAL HIGH (ref 65–99)
POTASSIUM: 3.2 mmol/L — AB (ref 3.5–5.1)
SODIUM: 128 mmol/L — AB (ref 135–145)

## 2016-06-06 LAB — LACTIC ACID, PLASMA
LACTIC ACID, VENOUS: 0.9 mmol/L (ref 0.5–1.9)
LACTIC ACID, VENOUS: 1 mmol/L (ref 0.5–1.9)

## 2016-06-06 LAB — MAGNESIUM: MAGNESIUM: 1.8 mg/dL (ref 1.7–2.4)

## 2016-06-06 MED ORDER — OXYCODONE HCL 5 MG PO TABS
10.0000 mg | ORAL_TABLET | Freq: Once | ORAL | Status: AC
  Administered 2016-06-06: 10 mg via ORAL
  Filled 2016-06-06: qty 2

## 2016-06-06 MED ORDER — ONDANSETRON HCL 4 MG/2ML IJ SOLN: 4.0000 mg | Freq: Four times a day (QID) | INTRAMUSCULAR | Status: DC | PRN

## 2016-06-06 MED ORDER — HYDROMORPHONE HCL 2 MG/ML IJ SOLN
0.5000 mg | INTRAMUSCULAR | Status: DC | PRN
  Administered 2016-06-06 (×2): 0.5 mg via INTRAVENOUS
  Filled 2016-06-06 (×2): qty 1

## 2016-06-06 MED ORDER — POTASSIUM & SODIUM PHOSPHATES 280-160-250 MG PO PACK
1.0000 | PACK | Freq: Four times a day (QID) | ORAL | Status: DC
  Administered 2016-06-06 (×4): 1 via ORAL
  Filled 2016-06-06 (×7): qty 1

## 2016-06-06 MED ORDER — FERROUS SULFATE 325 (65 FE) MG PO TABS
325.0000 mg | ORAL_TABLET | Freq: Three times a day (TID) | ORAL | Status: DC
Start: 2016-06-06 — End: 2016-06-07
  Administered 2016-06-06 (×3): 325 mg via ORAL
  Filled 2016-06-06 (×3): qty 1

## 2016-06-06 MED ORDER — PAREGORIC 2 MG/5ML PO TINC: 5.0000 mL | ORAL | Status: DC | PRN

## 2016-06-06 MED ORDER — DIPHENOXYLATE-ATROPINE 2.5-0.025 MG PO TABS
2.0000 | ORAL_TABLET | Freq: Four times a day (QID) | ORAL | Status: DC
  Administered 2016-06-06 (×4): 2 via ORAL
  Filled 2016-06-06 (×4): qty 2

## 2016-06-06 MED ORDER — ACETAMINOPHEN 325 MG PO TABS: 650.0000 mg | ORAL_TABLET | Freq: Four times a day (QID) | ORAL | Status: DC | PRN

## 2016-06-06 MED ORDER — ONDANSETRON HCL 4 MG PO TABS: 4.0000 mg | ORAL_TABLET | Freq: Four times a day (QID) | ORAL | Status: DC | PRN

## 2016-06-06 MED ORDER — ALPRAZOLAM 0.25 MG PO TABS: 0.2500 mg | ORAL_TABLET | Freq: Three times a day (TID) | ORAL | Status: DC | PRN

## 2016-06-06 MED ORDER — ENSURE ENLIVE PO LIQD
237.0000 mL | Freq: Two times a day (BID) | ORAL | Status: DC
Start: 2016-06-07 — End: 2016-06-07

## 2016-06-06 MED ORDER — MORPHINE SULFATE ER 100 MG PO TBCR
100.0000 mg | EXTENDED_RELEASE_TABLET | Freq: Two times a day (BID) | ORAL | Status: DC
  Administered 2016-06-06 (×3): 100 mg via ORAL
  Filled 2016-06-06 (×3): qty 1

## 2016-06-06 MED ORDER — PSYLLIUM 95 % PO PACK
1.0000 | PACK | Freq: Four times a day (QID) | ORAL | Status: DC
  Administered 2016-06-06 (×4): 1 via ORAL
  Filled 2016-06-06 (×7): qty 1

## 2016-06-06 MED ORDER — SODIUM CHLORIDE 0.9% FLUSH
3.0000 mL | Freq: Two times a day (BID) | INTRAVENOUS | Status: DC
  Administered 2016-06-06 – 2016-06-12 (×9): 3 mL via INTRAVENOUS

## 2016-06-06 MED ORDER — ACETAMINOPHEN 650 MG RE SUPP: 650.0000 mg | Freq: Four times a day (QID) | RECTAL | Status: DC | PRN

## 2016-06-06 MED ORDER — POTASSIUM CHLORIDE CRYS ER 20 MEQ PO TBCR
40.0000 meq | EXTENDED_RELEASE_TABLET | Freq: Once | ORAL | Status: AC
  Administered 2016-06-06: 40 meq via ORAL
  Filled 2016-06-06: qty 2

## 2016-06-06 NOTE — Progress Notes (Signed)
Called ED for report  

## 2016-06-06 NOTE — Progress Notes (Signed)
Received patient alert and oriented X 4, via stretcher; accompanied by wife and son. Verified arm band. IV R lower forearm is clean dry and intact. Has ostomy bag, which he cares for himself.

## 2016-06-06 NOTE — ED Notes (Signed)
Attempted report at 00:15 x1

## 2016-06-06 NOTE — Care Management Note (Signed)
Case Management Note  Patient Details  Name: CONSTANTIN COMOLLI MRN: [id number] Date of Birth: 1965-10-22  Subjective/Objective:               Spoke to patient and wife at the bedside. Pt from home with wife. Not currently active, but has used [facility] in past. They would like [facility] again if needed at discharge. [name] wife states Bo Merino is  Worker[employer] Armed forces operational officer  (phone: [phone], fax:  [phone]).   Worker's Comp Adjuster is [place] [employer] (phone: [phone], fax: [phone]). Patient is scheduled for reversal of colostomy 06/07/16.   Action/Plan:   Expected Discharge Date:                  Expected Discharge Plan:  [place]  In-House Referral:     Discharge planning Services  CM Consult  Post Acute Care Choice:    Choice offered to:  Patient, Spouse  DME Arranged:    DME Agency:     HH Arranged:    [place] Agency:     Status of Service:  In process, will continue to follow  If discussed at Long Length of Stay Meetings, dates discussed:    Additional Comments:  Carles Collet, RN 06/06/2016, 12:00 PM

## 2016-06-06 NOTE — Progress Notes (Signed)
Patient told me he would like to wait for his wife to be here when the admission history is done. Will report this to oncoming nurse.

## 2016-06-06 NOTE — Progress Notes (Signed)
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 States the morphine pills go right through him. He got 100 mg oxycontin around 0200.  Thank you. MD notified by this page.

## 2016-06-06 NOTE — Consult Note (Signed)
Reason for Consult:  Ileostomy in place with high ileostomy output Referring Physician: [name] is an 51 y.o. male.  HPI: Patient well know to the general surgery service status post ileocecectomy follow by an ileostomy for disrupted ileocolic anastomosis eleven days after the initial operation.  He was scheduled for reversal of his ileostomy tomorrow, but was admitted overnight for hyponatremia and high ileostomy output.  Past Medical History:  Diagnosis Date  . Anxiety    panic attacks  . Asthma   . Complication of anesthesia    " I got confused "  . Foot drop, left   . Headache    Migraine  . History of blood transfusion   . Ischemic bowel disease ([place])   . Pneumonia    in past  . Septic shock ([place]) 01/2016  . Small bowel perforation ([place]) 01/2016    Past Surgical History:  Procedure Laterality Date  . APPLICATION OF WOUND VAC N/A 02/08/2016   Procedure: APPLICATION OF WOUND VAC;  Surgeon: Judeth Horn, MD;  Location: [place] [facility];  Service: General;  Laterality: N/A;  . BACK SURGERY     x3  . BOWEL RESECTION N/A 01/27/2016   Procedure: SMALL BOWEL RESECTION;  Surgeon: Greer Pickerel, MD;  Location: [facility];  Service: General;  Laterality: N/A;  . EXPLORATORY LAPAROTOMY  01/27/2016  . ILEOCECETOMY N/A 02/08/2016   Procedure: [name];  Surgeon: Judeth Horn, MD;  Location: [place];  Service: General;  Laterality: N/A;  . ILEOSTOMY N/A 02/10/2016   Procedure: ILEOSTOMY;  Surgeon: Judeth Horn, MD;  Location: [place];  Service: General;  Laterality: N/A;  . LAPAROTOMY N/A 01/27/2016   Procedure: EXPLORATORY LAPAROTOMY;  Surgeon: Greer Pickerel, MD;  Location: [place];  Service: General;  Laterality: N/A;  . LAPAROTOMY N/A 02/08/2016   Procedure: EXPLORATORY LAPAROTOMY;  Surgeon: Judeth Horn, MD;  Location: [facility];  Service: General;  Laterality: N/A;  . LAPAROTOMY N/A 02/10/2016   Procedure: EXPLORATORY LAPAROTOMY;  Surgeon: Judeth Horn, MD;  Location: [place];  Service: General;   Laterality: N/A;  . VACUUM ASSISTED CLOSURE CHANGE N/A 02/12/2016   Procedure: EXPLORATORY LAPAROTOMY WITH CLOSURE;  Surgeon: Erroll Luna, MD;  Location: [place];  Service: General;  Laterality: N/A;    History reviewed. No pertinent family history.  Social History:  reports that he quit smoking about 4 months ago. His smoking use included Cigarettes. He has a 17.00 pack-year smoking history. He has never used smokeless tobacco. He reports that he does not drink alcohol or use drugs.  Allergies:  Allergies  Allergen Reactions  . Celecoxib Other (See Comments)    Other reaction(s): IRRITABILITY  . Clonazepam Itching  . Cyclobenzaprine Other (See Comments)    Other reaction(s): IRRITABILITY  . Dexamethasone Other (See Comments)    Other reaction(s): IRRITABILITY  . Diazepam Other (See Comments)    Other reaction(s): IRRITABILITY  . Etodolac Other (See Comments)    Other reaction(s): IRRITABILITY  . Gabapentin Other (See Comments)    Other reaction(s): IRRITABILITY  . Metaxalone Other (See Comments)    Other reaction(s): IRRITABILITY  . Methocarbamol Other (See Comments)    Other reaction(s): IRRITABILITY  . Pregabalin Other (See Comments)    Other reaction(s): IRRITABILITY  . Propoxyphene Other (See Comments)    Other reaction(s): IRRITABILITY  . Tramadol Other (See Comments)    Other reaction(s): IRRITABILITY  . Latex Itching and Rash  . Tape Rash and Other (See Comments)    PLASTIC TAPE TAKES  OFF 1-2 LAYERS OF [name] SKIN    Medications: I have reviewed the patient's current medications.  Results for orders placed or performed during the hospital encounter of 06/05/16 (from the past 48 hour(s))  Comprehensive metabolic panel     Status: Abnormal   Collection Time: 06/05/16  4:21 PM  Result Value Ref Range   Sodium 122 (L) 135 - 145 mmol/L   Potassium 2.6 (LL) 3.5 - 5.1 mmol/L    Comment: CRITICAL RESULT CALLED TO, READ BACK BY AND VERIFIED WITH: C PRICE,RN [id number]  06/05/2016 WBOND    Chloride 82 (L) 101 - 111 mmol/L   CO2 27 22 - 32 mmol/L   Glucose, Bld 130 (H) 65 - 99 mg/dL   BUN <5 (L) 6 - 20 mg/dL   Creatinine, Ser 1.01 0.61 - 1.24 mg/dL   Calcium 9.1 8.9 - 10.3 mg/dL   Total Protein 7.1 6.5 - 8.1 g/dL   Albumin 4.0 3.5 - 5.0 g/dL   AST 32 15 - 41 U/L   ALT 21 17 - 63 U/L   Alkaline Phosphatase 99 38 - 126 U/L   Total Bilirubin 0.4 0.3 - 1.2 mg/dL   GFR calc non Af Amer >60 >60 mL/min   GFR calc Af Amer >60 >60 mL/min    Comment: (NOTE) The eGFR has been calculated using the CKD EPI equation. This calculation has not been validated in all clinical situations. eGFR's persistently <60 mL/min signify possible Chronic Kidney Disease.    Anion gap 13 5 - 15  CBC     Status: Abnormal   Collection Time: 06/05/16  4:21 PM  Result Value Ref Range   WBC 8.0 4.0 - 10.5 K/uL   RBC 3.16 (L) 4.22 - 5.81 MIL/uL   Hemoglobin 9.4 (L) 13.0 - 17.0 g/dL   HCT 26.2 (L) 39.0 - 52.0 %   MCV 82.9 78.0 - 100.0 fL   MCH 29.7 26.0 - 34.0 pg   MCHC 35.9 30.0 - 36.0 g/dL   RDW 13.3 11.5 - 15.5 %   Platelets 222 150 - 400 K/uL  Type and screen [place]     Status: None   Collection Time: 06/05/16  4:21 PM  Result Value Ref Range   ABO/RH(D) A NEG    Antibody Screen NEG    Sample Expiration 06/08/2016   Urinalysis, Routine w reflex microscopic     Status: Abnormal   Collection Time: 06/05/16  9:22 PM  Result Value Ref Range   Color, Urine COLORLESS (A) YELLOW   APPearance CLEAR CLEAR   Specific Gravity, Urine 1.000 (L) 1.005 - 1.030   pH 7.0 5.0 - 8.0   Glucose, UA NEGATIVE NEGATIVE mg/dL   Hgb urine dipstick NEGATIVE NEGATIVE   Bilirubin Urine NEGATIVE NEGATIVE   Ketones, ur NEGATIVE NEGATIVE mg/dL   Protein, ur NEGATIVE NEGATIVE mg/dL   Nitrite NEGATIVE NEGATIVE   Leukocytes, UA NEGATIVE NEGATIVE  Na and K (sodium & potassium), rand urine     Status: None   Collection Time: 06/05/16  9:22 PM  Result Value Ref Range    Sodium, Ur <10 mmol/L   Potassium Urine 11 mmol/L  Lactic acid, plasma     Status: None   Collection Time: 06/06/16 12:21 AM  Result Value Ref Range   Lactic Acid, Venous 1.0 0.5 - 1.9 mmol/L  Lactic acid, plasma     Status: None   Collection Time: 06/06/16  2:11 AM  Result Value Ref Range  Lactic Acid, Venous 0.9 0.5 - 1.9 mmol/L  Magnesium     Status: None   Collection Time: 06/06/16  2:11 AM  Result Value Ref Range   Magnesium 1.8 1.7 - 2.4 mg/dL  Basic metabolic panel     Status: Abnormal   Collection Time: 06/06/16  2:11 AM  Result Value Ref Range   Sodium 128 (L) 135 - 145 mmol/L   Potassium 3.2 (L) 3.5 - 5.1 mmol/L    Comment: DELTA CHECK NOTED   Chloride 89 (L) 101 - 111 mmol/L   CO2 29 22 - 32 mmol/L   Glucose, Bld 105 (H) 65 - 99 mg/dL   BUN <5 (L) 6 - 20 mg/dL   Creatinine, Ser 0.85 0.61 - 1.24 mg/dL   Calcium 9.5 8.9 - 10.3 mg/dL   GFR calc non Af Amer >60 >60 mL/min   GFR calc Af Amer >60 >60 mL/min    Comment: (NOTE) The eGFR has been calculated using the CKD EPI equation. This calculation has not been validated in all clinical situations. eGFR's persistently <60 mL/min signify possible Chronic Kidney Disease.    Anion gap 10 5 - 15    Chest 2 View  Result Date: 06/04/2016 CLINICAL DATA:  Ileostomy takedown.  CHF. EXAM: CHEST  2 VIEW COMPARISON:  February 29, 2016 FINDINGS: No pneumothorax. The cardiomediastinal silhouette is normal. Persistent opacity in the left lung base continues to improve. The linear nature suggests a component of atelectasis and/or scar. No other interval changes or acute abnormalities. IMPRESSION: Continued improvement of the left lower lobe opacity. I favor at least a component of this opacity is atelectasis or scar. Electronically Signed   By: Dorise Bullion III M.D   On: 06/04/2016 18:43   Ct Abdomen Pelvis W Contrast  Result Date: 06/05/2016 CLINICAL DATA:  Follow-up incarcerated ventral hernia distal to the ostomy EXAM: CT  ABDOMEN AND PELVIS WITH CONTRAST TECHNIQUE: Multidetector CT imaging of the abdomen and pelvis was performed using the standard protocol following bolus administration of intravenous contrast. CONTRAST:  100 mL Isovue-300 intravenous COMPARISON:  02/25/2016 FINDINGS: Lower chest: Linear scarring in the left lower lobe. No acute infiltrate or effusion at the bilateral lung bases. The heart is nonenlarged. Hepatobiliary: Multiple hypodense liver lesions are again visualized with slight increase in size of several lesions 8 lateral segment left hepatic lobe lesion measures 2.2 by 2 cm compared with 2.1 x 2.1 cm previously and demonstrates patchy internal enhancement. A lesion in the posterior right hepatic lobe measures 1 cm, compared with 7 mm previously. An irregular hypodense lesion in the medial segment of the left hepatic lobe is not clearly identified on the current study. No calcified gallstones. No biliary dilatation. Pancreas: Unremarkable. No pancreatic ductal dilatation or surrounding inflammatory changes. Spleen: Normal in size without focal abnormality. Adrenals/Urinary Tract: Adrenal glands are unremarkable. Kidneys are normal, without renal calculi, focal lesion, or hydronephrosis. Bladder is unremarkable. Stomach/Bowel: There is a right lower quadrant ostomy. Patient is status post ileocecal and distal small bowel resection. No parastomal hernia is visualized. There is no evidence for a small bowel obstruction. Collapsed blind ending large bowel with mild wall thickening as before. Vascular/Lymphatic: Aortic atherosclerosis. Stable multiple mildly enlarged upper abdominal lymph nodes. Reproductive: Prostate is unremarkable. Other: There is no free air or free fluid. Soft tissue thickening and edema/ inflammatory response around the right greater than left rectus sheath. Midline soft tissue thickening situated between the rectus muscles below the level of the umbilicus. No  evidence for a soft tissue  abscess. Musculoskeletal: Stable postoperative changes at L3-L4 and L4-L5. Additional lateral surgical plate and screw fixation partially visualized at T10. IMPRESSION: 1. Status post the ileocecal and distal small bowel resection with right lower quadrant ileostomy. No parastomal hernia is visualized. There is no bowel containing ventral hernia identified. 2. Slightly enlarged appearance of the inferior rectus muscles with subcutaneous edema and focal midline soft tissue thickening along the midline infra umbilical abdominal wall, this could relate to postsurgical changes, or possibly infection/ phlegmon. There is some fluid around the right rectus muscle. There is no soft tissue abscess. 3. Multiple indeterminate low-density lesions in the liver, several of which have increased in size. Findings could be secondary to multifocal infection with neoplastic etiology not excluded. As previously recommended, nonemergent MRI follow-up may be performed when clinically feasible. Electronically Signed   By: Donavan Foil M.D.   On: 06/05/2016 22:32    Review of Systems  Constitutional: Negative for chills and fever.  Gastrointestinal: Positive for abdominal pain (chronic).  All other systems reviewed and are negative.  Blood pressure (!) 93/58, pulse 75, temperature 97.7 F (36.5 C), temperature source Oral, resp. rate 18, height 5' 9"  (1.753 m), weight 69.3 kg (152 lb 12.8 oz), SpO2 100 %. Physical Exam  Vitals reviewed. Constitutional: He appears well-developed and well-nourished.  HENT:  Head: Normocephalic and atraumatic.  Eyes: Conjunctivae are normal. Pupils are equal, round, and reactive to light.  Neck: Normal range of motion. Neck supple.  Cardiovascular: Normal rate, regular rhythm and normal heart sounds.   Respiratory: Effort normal and breath sounds normal.  GI: Soft. Bowel sounds are normal. There is tenderness in the right lower quadrant. There is no rigidity, no rebound and no guarding.     Musculoskeletal: Normal range of motion.  Skin: Skin is warm and dry.  Psychiatric: He has a normal mood and affect. His behavior is normal. Judgment and thought content normal.    Assessment/Plan: Ileostomy in place for reversal in the OR tomorrow.    Now that his hyponatremia has been corrected we should be able to go ahead with surgery tomorrow.  We still need to monitor his ileostomy output ahd keep it under control.    Annaleise Burger 06/06/2016, 10:37 AM

## 2016-06-06 NOTE — Progress Notes (Signed)
Initial Nutrition Assessment  DOCUMENTATION CODES:   Non-severe (moderate) malnutrition in context of chronic illness  INTERVENTION:   -Ensure Enlive po BID, each supplement provides 350 kcal and 20 grams of protein  NUTRITION DIAGNOSIS:   Malnutrition related to chronic illness as evidenced by mild depletion of body fat, moderate depletion of body fat, mild depletion of muscle mass, moderate depletions of muscle mass.  GOAL:   Patient will meet greater than or equal to 90% of their needs  MONITOR:   PO intake, Supplement acceptance, Labs, Weight trends, Skin, I & O's  REASON FOR ASSESSMENT:   Malnutrition Screening Tool    ASSESSMENT:   [name] is a 51 y.o. 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  He also has a history of chronic pain with narcotic dependence and recurrent electrolyte abnormalities.  He reported for outpatient labs yesterday as part of his pre-op evaluation for planned ostomy  Pt admitted with hypovolemic hyponatremia, hypokalemia secondary to excessive GI losses.   Spoke with pt at bedside, who reports he has had a hx of weight loss due to previous extended hospitalization, but has now since gained about 50% of lost weight back. Pt reports good appetite, with 100% meal completion. Pt reports he consumes 3 meals per day at home, with several snacks in between. Pt reports he also consumes protein shakes 1-2 times daily, using milk and a protein powder that he purchases from [facility] (suspect this is Elevation Protein Powder, which provides 170 kcals and 30 grams of protein in each scoop- total of 292 kcals and 38 grams of protein if added to 8 oz of 2% milk).   Pt states that he is anxious for his ileostomy reversal.    Nutrition-Focused physical exam completed. Findings are mild to moderate fat depletion, mild to moderate muscle depletion, and no edema.   Pt amenable to Ensure supplements while he is here. Discussed ways to continue to increase protein in his diet and enocuraged continuance of protein shakes.   Labs reviewed: K: 3.2 (on PO supplementation).   Diet Order:  Diet regular Room service appropriate? Yes; Fluid consistency: Thin Diet NPO time specified  Skin:  Reviewed, no issues  Last BM:  06/06/16  Height:   Ht Readings from Last 1 Encounters:  06/06/16 5\' 9"  (1.753 m)    Weight:   Wt Readings from Last 1 Encounters:  06/06/16 152 lb 12.8 oz (69.3 kg)    Ideal Body Weight:  72.7 kg  BMI:  Body mass index is 22.56 kg/m.  Estimated Nutritional Needs:   Kcal:  2000-2200  Protein:  100-115 grams  Fluid:  >2 L  EDUCATION NEEDS:   Education needs addressed  Lyndsie Wallman A. Jimmye Norman, RD, LDN, CDE Pager: [phone] After hours Pager: [phone]

## 2016-06-06 NOTE — Progress Notes (Signed)
PROGRESS NOTE    [name]  [id number] DOB: Dec 05, 1965 DOA: 06/05/2016 PCP: No primary care provider on file.   Chief Complaint  Patient presents with  . Abnormal Lab    Brief Narrative:  HPI on 06/05/2016 by Dr. Lily Kocher [name] is a 51 y.o. 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  He also has a history of chronic pain with narcotic dependence and recurrent electrolyte abnormalities.  He reported for outpatient labs yesterday as part of his pre-op evaluation for planned ostomy Reversal.  He was found to have low sodium and potassium levels.  He was told to come to the ED for management.  Of note, he is supposed to be on an oral sodium/potassium supplement at home.  While in the ED he reported new pain in his lower abdomen that started in the past 1-2 days after sneezing.  He now has a palpable mass/area of herniation to the right of his midline abdominal incision.  No fevers.  He has had chills but no sweats.  He has intermittent light-headedness (which is not new); no LOC.  No chest pain or shortness of breath.  No nausea or vomiting.  Appetite has been stable. Assessment & Plan   Hypovolemic hyponatremia, hypokalemia secondary to excessive GI losses -Patient does have ileostomy (which is to be reversed on 2/22) -Sodium upon admission 117, currently 128  -Continue IVF -Potassium improving, currently 3.2, continue to replace -Magnesium ordered -Continue to monitor BMP  Abnormal findings on CT -General Surgery consulted and appreciated -Plan for reversal on 06/07/16 -No signs of sepsis at this point -Lactic acid normal -Monitor abdominal exam  Abnormal liver lesions -Needs MRI of liver at some point.   History of small bowel  resection, ostomy -Due for ostomy reversal 06/07/2016 -Continue scheduled imodium, lomotil -General surgery consulted and appreciated  Chronic anemia -Hemoglobin currently 9.4, currently stable and at baseline -Continue iron supplement -monitor CBC  Chronic pain -MS contin substitute for Opana -Will add dilaudid PRN  Anxiety -Xanax PRN  DVT Prophylaxis  SCDs  Code Status: Full  Family Communication: None at bedside  Disposition Plan: Observation  Consultants General surgery  Procedures  None  Antibiotics   Anti-infectives    None      Subjective:   [name] seen and examined today.  Patient continues to complain of pain. Would like to order a different tray. Denies chest pain, shortness of breath, dizziness, headache. Complains of increased output.   Objective:   Vitals:   06/05/16 2100 06/05/16 2300 06/06/16 0107 06/06/16 0547  BP: 113/74 118/62 122/78 (!) 93/58  Pulse: 79 73 81 75  Resp: 18 18 18 18   Temp:   98.2 F (36.8 C) 97.7 F (36.5 C)  TempSrc:   Oral Oral  SpO2: 100% 100% 100% 100%  Weight:   69.3 kg (152 lb 12.8 oz)   Height:   5\' 9"  (1.753 m)     Intake/Output Summary (Last 24 hours) at 06/06/16 1256 Last data filed at 06/06/16 0900  Gross per 24 hour  Intake                0 ml  Output             1175 ml  Net            -  1175 ml   Filed Weights   06/06/16 0107  Weight: 69.3 kg (152 lb 12.8 oz)    Exam  General: Well developed, well nourished, NAD, appears stated age  HEENT: NCAT, mucous membranes moist.   Cardiovascular: S1 S2 auscultated, no rubs, murmurs or gallops. Regular rate and rhythm.  Respiratory: Clear to auscultation bilaterally with equal chest rise  Abdomen: Soft, RLQ TTP, nondistended, + bowel sounds, ostomy  Extremities: warm dry without cyanosis clubbing or edema  Neuro: AAOx3, nonfocal  Psych: Normal affect and demeanor with intact judgement and insight   Data Reviewed: I have personally  reviewed following labs and imaging studies  CBC:  Recent Labs Lab 06/04/16 1402 06/05/16 1621  WBC 8.6 8.0  NEUTROABS 4.8  --   HGB 8.6* 9.4*  HCT 23.9* 26.2*  MCV 82.4 82.9  PLT 204 [id number]   Basic Metabolic Panel:  Recent Labs Lab 06/04/16 1402 06/05/16 1621 06/06/16 0211  NA 117* 122* 128*  K 3.1* 2.6* 3.2*  CL 77* 82* 89*  CO2 28 27 29   GLUCOSE 87 130* 105*  BUN 5* <5* <5*  CREATININE 0.91 1.01 0.85  CALCIUM 8.9 9.1 9.5  MG  --   --  1.8   GFR: Estimated Creatinine Clearance: 101.9 mL/min (by C-G formula based on SCr of 0.85 mg/dL). Liver Function Tests:  Recent Labs Lab 06/05/16 1621  AST 32  ALT 21  ALKPHOS 99  BILITOT 0.4  PROT 7.1  ALBUMIN 4.0   No results for input(s): LIPASE, AMYLASE in the last 168 hours. No results for input(s): AMMONIA in the last 168 hours. Coagulation Profile: No results for input(s): INR, PROTIME in the last 168 hours. Cardiac Enzymes: No results for input(s): CKTOTAL, CKMB, CKMBINDEX, TROPONINI in the last 168 hours. BNP (last 3 results) No results for input(s): PROBNP in the last 8760 hours. HbA1C: No results for input(s): HGBA1C in the last 72 hours. CBG: No results for input(s): GLUCAP in the last 168 hours. Lipid Profile: No results for input(s): CHOL, HDL, LDLCALC, TRIG, CHOLHDL, LDLDIRECT in the last 72 hours. Thyroid Function Tests: No results for input(s): TSH, T4TOTAL, FREET4, T3FREE, THYROIDAB in the last 72 hours. Anemia Panel: No results for input(s): VITAMINB12, FOLATE, FERRITIN, TIBC, IRON, RETICCTPCT in the last 72 hours. Urine analysis:    Component Value Date/Time   COLORURINE COLORLESS (A) 06/05/2016 2122   APPEARANCEUR CLEAR 06/05/2016 2122   LABSPEC 1.000 (L) 06/05/2016 2122   PHURINE 7.0 06/05/2016 2122   GLUCOSEU NEGATIVE 06/05/2016 2122   HGBUR NEGATIVE 06/05/2016 2122   BILIRUBINUR NEGATIVE 06/05/2016 2122   KETONESUR NEGATIVE 06/05/2016 2122   PROTEINUR NEGATIVE 06/05/2016 2122    UROBILINOGEN 1.0 07/28/2007 1539   NITRITE NEGATIVE 06/05/2016 2122   LEUKOCYTESUR NEGATIVE 06/05/2016 2122   Sepsis Labs: @LABRCNTIP (procalcitonin:4,lacticidven:4)  )No results found for this or any previous visit (from the past 240 hour(s)).    Radiology Studies: Chest 2 View  Result Date: 06/04/2016 CLINICAL DATA:  Ileostomy takedown.  CHF. EXAM: CHEST  2 VIEW COMPARISON:  February 29, 2016 FINDINGS: No pneumothorax. The cardiomediastinal silhouette is normal. Persistent opacity in the left lung base continues to improve. The linear nature suggests a component of atelectasis and/or scar. No other interval changes or acute abnormalities. IMPRESSION: Continued improvement of the left lower lobe opacity. I favor at least a component of this opacity is atelectasis or scar. Electronically Signed   By: Dorise Bullion III M.D   On: 06/04/2016 18:43   Ct  Abdomen Pelvis W Contrast  Result Date: 06/05/2016 CLINICAL DATA:  Follow-up incarcerated ventral hernia distal to the ostomy EXAM: CT ABDOMEN AND PELVIS WITH CONTRAST TECHNIQUE: Multidetector CT imaging of the abdomen and pelvis was performed using the standard protocol following bolus administration of intravenous contrast. CONTRAST:  100 mL Isovue-300 intravenous COMPARISON:  02/25/2016 FINDINGS: Lower chest: Linear scarring in the left lower lobe. No acute infiltrate or effusion at the bilateral lung bases. The heart is nonenlarged. Hepatobiliary: Multiple hypodense liver lesions are again visualized with slight increase in size of several lesions 8 lateral segment left hepatic lobe lesion measures 2.2 by 2 cm compared with 2.1 x 2.1 cm previously and demonstrates patchy internal enhancement. A lesion in the posterior right hepatic lobe measures 1 cm, compared with 7 mm previously. An irregular hypodense lesion in the medial segment of the left hepatic lobe is not clearly identified on the current study. No calcified gallstones. No biliary  dilatation. Pancreas: Unremarkable. No pancreatic ductal dilatation or surrounding inflammatory changes. Spleen: Normal in size without focal abnormality. Adrenals/Urinary Tract: Adrenal glands are unremarkable. Kidneys are normal, without renal calculi, focal lesion, or hydronephrosis. Bladder is unremarkable. Stomach/Bowel: There is a right lower quadrant ostomy. Patient is status post ileocecal and distal small bowel resection. No parastomal hernia is visualized. There is no evidence for a small bowel obstruction. Collapsed blind ending large bowel with mild wall thickening as before. Vascular/Lymphatic: Aortic atherosclerosis. Stable multiple mildly enlarged upper abdominal lymph nodes. Reproductive: Prostate is unremarkable. Other: There is no free air or free fluid. Soft tissue thickening and edema/ inflammatory response around the right greater than left rectus sheath. Midline soft tissue thickening situated between the rectus muscles below the level of the umbilicus. No evidence for a soft tissue abscess. Musculoskeletal: Stable postoperative changes at L3-L4 and L4-L5. Additional lateral surgical plate and screw fixation partially visualized at T10. IMPRESSION: 1. Status post the ileocecal and distal small bowel resection with right lower quadrant ileostomy. No parastomal hernia is visualized. There is no bowel containing ventral hernia identified. 2. Slightly enlarged appearance of the inferior rectus muscles with subcutaneous edema and focal midline soft tissue thickening along the midline infra umbilical abdominal wall, this could relate to postsurgical changes, or possibly infection/ phlegmon. There is some fluid around the right rectus muscle. There is no soft tissue abscess. 3. Multiple indeterminate low-density lesions in the liver, several of which have increased in size. Findings could be secondary to multifocal infection with neoplastic etiology not excluded. As previously recommended, nonemergent  MRI follow-up may be performed when clinically feasible. Electronically Signed   By: Donavan Foil M.D.   On: 06/05/2016 22:32     Scheduled Meds: . diphenoxylate-atropine  2 tablet Oral QID  . ferrous sulfate  325 mg Oral TID WC  . loperamide  8 mg Oral QID  . morphine  100 mg Oral Q12H  . potassium & sodium phosphates  1 packet Oral QID  . psyllium  1 packet Oral QID  . sodium chloride flush  3 mL Intravenous Q12H   Continuous Infusions:   LOS: 0 days   Time Spent in minutes   30 minutes  Taz Vanness D.O. on 06/06/2016 at 12:56 PM  Between 7am to 7pm - Pager - [phone]  After 7pm go to [email or web address] - password [email or web address]  And look for the night coverage person covering for me after hours  [facility] Office  [phone]

## 2016-06-07 ENCOUNTER — Encounter (HOSPITAL_COMMUNITY): Payer: Self-pay | Admitting: Surgery

## 2016-06-07 ENCOUNTER — Inpatient Hospital Stay (HOSPITAL_COMMUNITY): Payer: Worker's Compensation | Admitting: Anesthesiology

## 2016-06-07 ENCOUNTER — Inpatient Hospital Stay (HOSPITAL_COMMUNITY): Payer: Worker's Compensation | Admitting: Vascular Surgery

## 2016-06-07 ENCOUNTER — Encounter (HOSPITAL_COMMUNITY): Admission: RE | Payer: Self-pay | Source: Ambulatory Visit

## 2016-06-07 ENCOUNTER — Inpatient Hospital Stay (HOSPITAL_COMMUNITY): Payer: Worker's Compensation

## 2016-06-07 ENCOUNTER — Encounter (HOSPITAL_COMMUNITY)
Admission: EM | Disposition: A | Discharge status: Home / Self Care | Payer: Self-pay | Source: Home / Self Care | Attending: Internal Medicine

## 2016-06-07 ENCOUNTER — Inpatient Hospital Stay (HOSPITAL_COMMUNITY): Admission: RE | Admit: 2016-06-07 | Payer: Worker's Compensation | Source: Ambulatory Visit | Admitting: General Surgery

## 2016-06-07 DIAGNOSIS — E440 Moderate protein-calorie malnutrition: Secondary | ICD-10-CM

## 2016-06-07 HISTORY — PX: ILEOSTOMY CLOSURE: SHX1784

## 2016-06-07 LAB — BASIC METABOLIC PANEL
ANION GAP: 12 (ref 5–15)
BUN: 6 mg/dL (ref 6–20)
CALCIUM: 9.5 mg/dL (ref 8.9–10.3)
CO2: 23 mmol/L (ref 22–32)
CREATININE: 0.94 mg/dL (ref 0.61–1.24)
Chloride: 94 mmol/L — ABNORMAL LOW (ref 101–111)
GFR calc Af Amer: >60 mL/min (ref >60)
GLUCOSE: 98 mg/dL (ref 65–99)
Potassium: 4.1 mmol/L (ref 3.5–5.1)
Sodium: 129 mmol/L — ABNORMAL LOW (ref 135–145)

## 2016-06-07 LAB — URINE CULTURE: CULTURE: NO GROWTH

## 2016-06-07 LAB — MAGNESIUM: Magnesium: 1.6 mg/dL — ABNORMAL LOW (ref 1.7–2.4)

## 2016-06-07 LAB — CBC
HCT: 24.6 % — ABNORMAL LOW (ref 39.0–52.0)
Hemoglobin: 8.5 g/dL — ABNORMAL LOW (ref 13.0–17.0)
MCH: 29.5 pg (ref 26.0–34.0)
MCHC: 34.6 g/dL (ref 30.0–36.0)
MCV: 85.4 fL (ref 78.0–100.0)
PLATELETS: 208 10*3/uL (ref 150–400)
RBC: 2.88 MIL/uL — ABNORMAL LOW (ref 4.22–5.81)
RDW: 13.7 % (ref 11.5–15.5)
WBC: 6.7 10*3/uL (ref 4.0–10.5)

## 2016-06-07 LAB — SURGICAL PCR SCREEN
MRSA, PCR: NEGATIVE
Staphylococcus aureus: POSITIVE — AB

## 2016-06-07 SURGERY — CLOSURE, ILEOSTOMY
Anesthesia: General | Site: Abdomen

## 2016-06-07 SURGERY — CLOSURE, ILEOSTOMY
Anesthesia: General

## 2016-06-07 MED ORDER — ONDANSETRON HCL 4 MG/2ML IJ SOLN: 4.0000 mg | Freq: Four times a day (QID) | INTRAMUSCULAR | Status: DC | PRN

## 2016-06-07 MED ORDER — LACTATED RINGERS IV SOLN
INTRAVENOUS | Status: DC
  Administered 2016-06-07: 09:00:00 via INTRAVENOUS

## 2016-06-07 MED ORDER — CHLORHEXIDINE GLUCONATE CLOTH 2 % EX PADS: 6.0000 | MEDICATED_PAD | Freq: Once | CUTANEOUS | Status: AC

## 2016-06-07 MED ORDER — SODIUM CHLORIDE 0.9% FLUSH: 9.0000 mL | INTRAVENOUS | Status: DC | PRN

## 2016-06-07 MED ORDER — ROCURONIUM BROMIDE 100 MG/10ML IV SOLN
INTRAVENOUS | Status: DC | PRN
  Administered 2016-06-07: 10 mg via INTRAVENOUS
  Administered 2016-06-07: 40 mg via INTRAVENOUS
  Administered 2016-06-07: 10 mg via INTRAVENOUS

## 2016-06-07 MED ORDER — ONDANSETRON HCL 4 MG/2ML IJ SOLN
INTRAMUSCULAR | Status: DC | PRN
  Administered 2016-06-07: 4 mg via INTRAVENOUS

## 2016-06-07 MED ORDER — CEFOTETAN DISODIUM 2 G IJ SOLR
2.0000 g | Freq: Two times a day (BID) | INTRAMUSCULAR | Status: AC
  Administered 2016-06-07: 2 g via INTRAVENOUS
  Filled 2016-06-07: qty 2

## 2016-06-07 MED ORDER — LACTATED RINGERS IV SOLN
INTRAVENOUS | Status: DC | PRN
  Administered 2016-06-07 (×2): via INTRAVENOUS

## 2016-06-07 MED ORDER — HYDROMORPHONE HCL 1 MG/ML IJ SOLN
INTRAMUSCULAR | Status: AC
  Filled 2016-06-07: qty 2

## 2016-06-07 MED ORDER — OXYCODONE HCL 5 MG PO TABS: 5.0000 mg | ORAL_TABLET | Freq: Once | ORAL | Status: DC | PRN

## 2016-06-07 MED ORDER — HYDROMORPHONE HCL 1 MG/ML IJ SOLN
0.2500 mg | INTRAMUSCULAR | Status: DC | PRN
  Administered 2016-06-07: 0.5 mg via INTRAVENOUS
  Administered 2016-06-07: 1 mg via INTRAVENOUS
  Administered 2016-06-07: 0.5 mg via INTRAVENOUS

## 2016-06-07 MED ORDER — HYDROMORPHONE 1 MG/ML IV SOLN
INTRAVENOUS | Status: AC
  Filled 2016-06-07: qty 25

## 2016-06-07 MED ORDER — NALOXONE HCL 0.4 MG/ML IJ SOLN: 0.4000 mg | INTRAMUSCULAR | Status: DC | PRN

## 2016-06-07 MED ORDER — LIDOCAINE HCL (CARDIAC) 20 MG/ML IV SOLN
INTRAVENOUS | Status: DC | PRN
  Administered 2016-06-07: 60 mg via INTRAVENOUS

## 2016-06-07 MED ORDER — DIPHENHYDRAMINE HCL 50 MG/ML IJ SOLN
12.5000 mg | Freq: Four times a day (QID) | INTRAMUSCULAR | Status: DC | PRN
  Filled 2016-06-07: qty 0.25

## 2016-06-07 MED ORDER — DIPHENHYDRAMINE HCL 12.5 MG/5ML PO ELIX: 12.5000 mg | ORAL_SOLUTION | Freq: Four times a day (QID) | ORAL | Status: DC | PRN

## 2016-06-07 MED ORDER — ONDANSETRON HCL 4 MG/2ML IJ SOLN
4.0000 mg | Freq: Four times a day (QID) | INTRAMUSCULAR | Status: DC | PRN
Start: 2016-06-07 — End: 2016-06-12

## 2016-06-07 MED ORDER — HYDROMORPHONE 1 MG/ML IV SOLN
INTRAVENOUS | Status: DC
  Administered 2016-06-07: 13:00:00 via INTRAVENOUS

## 2016-06-07 MED ORDER — PROPOFOL 10 MG/ML IV BOLUS
INTRAVENOUS | Status: DC | PRN
  Administered 2016-06-07: 200 mg via INTRAVENOUS

## 2016-06-07 MED ORDER — MAGNESIUM SULFATE 2 GM/50ML IV SOLN
2.0000 g | Freq: Once | INTRAVENOUS | Status: DC
  Filled 2016-06-07: qty 50

## 2016-06-07 MED ORDER — OXYCODONE HCL 5 MG/5ML PO SOLN: 5.0000 mg | Freq: Once | ORAL | Status: DC | PRN

## 2016-06-07 MED ORDER — MIDAZOLAM HCL 5 MG/5ML IJ SOLN
INTRAMUSCULAR | Status: DC | PRN
  Administered 2016-06-07: 2 mg via INTRAVENOUS

## 2016-06-07 MED ORDER — NALOXONE HCL 0.4 MG/ML IJ SOLN
0.4000 mg | INTRAMUSCULAR | Status: DC | PRN
  Filled 2016-06-07: qty 1

## 2016-06-07 MED ORDER — MUPIROCIN 2 % EX OINT
1.0000 "application " | TOPICAL_OINTMENT | Freq: Two times a day (BID) | CUTANEOUS | Status: AC
  Administered 2016-06-07 – 2016-06-11 (×10): 1 via NASAL
  Filled 2016-06-07 (×3): qty 22

## 2016-06-07 MED ORDER — 0.9 % SODIUM CHLORIDE (POUR BTL) OPTIME
TOPICAL | Status: DC | PRN
Start: 2016-06-07 — End: 2016-06-07
  Administered 2016-06-07 (×5): 1000 mL

## 2016-06-07 MED ORDER — MAGNESIUM SULFATE 2 GM/50ML IV SOLN
2.0000 g | Freq: Once | INTRAVENOUS | Status: AC
  Administered 2016-06-07: 2 g via INTRAVENOUS
  Filled 2016-06-07: qty 50

## 2016-06-07 MED ORDER — CHLORHEXIDINE GLUCONATE CLOTH 2 % EX PADS
6.0000 | MEDICATED_PAD | Freq: Once | CUTANEOUS | Status: AC
  Administered 2016-06-07: 6 via TOPICAL

## 2016-06-07 MED ORDER — KCL IN DEXTROSE-NACL 20-5-0.9 MEQ/L-%-% IV SOLN
INTRAVENOUS | Status: DC
  Administered 2016-06-07 – 2016-06-10 (×7): via INTRAVENOUS
  Filled 2016-06-07 (×9): qty 1000

## 2016-06-07 MED ORDER — DEXTROSE 5 % IV SOLN
2.0000 g | INTRAVENOUS | Status: AC
  Administered 2016-06-07: 2 g via INTRAVENOUS
  Filled 2016-06-07: qty 2

## 2016-06-07 MED ORDER — POVIDONE-IODINE 10 % EX OINT
TOPICAL_OINTMENT | CUTANEOUS | Status: DC | PRN
  Administered 2016-06-07: 1 via TOPICAL

## 2016-06-07 MED ORDER — DIPHENHYDRAMINE HCL 12.5 MG/5ML PO ELIX
12.5000 mg | ORAL_SOLUTION | Freq: Four times a day (QID) | ORAL | Status: DC | PRN
  Filled 2016-06-07: qty 5

## 2016-06-07 MED ORDER — HYDROMORPHONE 1 MG/ML IV SOLN: INTRAVENOUS | Status: DC

## 2016-06-07 MED ORDER — ENOXAPARIN SODIUM 40 MG/0.4ML ~~LOC~~ SOLN
40.0000 mg | SUBCUTANEOUS | Status: DC
  Administered 2016-06-08 – 2016-06-12 (×5): 40 mg via SUBCUTANEOUS
  Filled 2016-06-07 (×5): qty 0.4

## 2016-06-07 MED ORDER — ONDANSETRON HCL 4 MG/2ML IJ SOLN
4.0000 mg | Freq: Four times a day (QID) | INTRAMUSCULAR | Status: DC | PRN
  Filled 2016-06-07: qty 2

## 2016-06-07 MED ORDER — HYDROMORPHONE 1 MG/ML IV SOLN
INTRAVENOUS | Status: DC
  Administered 2016-06-07: 2.1 mg via INTRAVENOUS
  Administered 2016-06-07: 2.7 mg via INTRAVENOUS
  Administered 2016-06-08: 4.8 mg via INTRAVENOUS
  Administered 2016-06-08: 3.9 mg via INTRAVENOUS
  Administered 2016-06-08: 5.7 mg via INTRAVENOUS
  Administered 2016-06-08: 0.6 mg via INTRAVENOUS
  Administered 2016-06-08: 4.7 mg via INTRAVENOUS
  Filled 2016-06-07: qty 25

## 2016-06-07 MED ORDER — DIPHENHYDRAMINE HCL 50 MG/ML IJ SOLN: 12.5000 mg | Freq: Four times a day (QID) | INTRAMUSCULAR | Status: DC | PRN

## 2016-06-07 MED ORDER — KETOROLAC TROMETHAMINE 15 MG/ML IJ SOLN
15.0000 mg | Freq: Four times a day (QID) | INTRAMUSCULAR | Status: DC
  Administered 2016-06-07 – 2016-06-10 (×12): 15 mg via INTRAVENOUS
  Filled 2016-06-07 (×12): qty 1

## 2016-06-07 MED ORDER — SUGAMMADEX SODIUM 200 MG/2ML IV SOLN
INTRAVENOUS | Status: DC | PRN
  Administered 2016-06-07: 200 mg via INTRAVENOUS

## 2016-06-07 MED ORDER — FENTANYL CITRATE (PF) 100 MCG/2ML IJ SOLN
INTRAMUSCULAR | Status: DC | PRN
  Administered 2016-06-07 (×3): 50 ug via INTRAVENOUS
  Administered 2016-06-07: 100 ug via INTRAVENOUS
  Administered 2016-06-07: 50 ug via INTRAVENOUS
  Administered 2016-06-07: 100 ug via INTRAVENOUS

## 2016-06-07 MED ORDER — CHLORHEXIDINE GLUCONATE CLOTH 2 % EX PADS
6.0000 | MEDICATED_PAD | Freq: Every day | CUTANEOUS | Status: AC
  Administered 2016-06-08 – 2016-06-11 (×4): 6 via TOPICAL

## 2016-06-07 SURGICAL SUPPLY — 49 items
BLADE SURG ROTATE 9660 (MISCELLANEOUS) ×3 IMPLANT
CANISTER SUCTION 2500CC (MISCELLANEOUS) ×3 IMPLANT
COVER MAYO STAND STRL (DRAPES) ×3 IMPLANT
COVER SURGICAL LIGHT HANDLE (MISCELLANEOUS) ×6 IMPLANT
DRAIN PENROSE 1/4X12 LTX STRL (WOUND CARE) ×3 IMPLANT
DRAPE LAPAROSCOPIC ABDOMINAL (DRAPES) ×3 IMPLANT
DRAPE UTILITY XL STRL (DRAPES) ×6 IMPLANT
DRAPE WARM FLUID 44X44 (DRAPE) ×3 IMPLANT
DRSG OPSITE POSTOP 4X10 (GAUZE/BANDAGES/DRESSINGS) ×3 IMPLANT
ELECT CAUTERY BLADE 6.4 (BLADE) ×3 IMPLANT
ELECT REM PT RETURN 9FT ADLT (ELECTROSURGICAL) ×3
ELECTRODE REM PT RTRN 9FT ADLT (ELECTROSURGICAL) ×1 IMPLANT
GAUZE SPONGE 4X4 12PLY STRL (GAUZE/BANDAGES/DRESSINGS) ×3 IMPLANT
GLOVE BIOGEL PI IND STRL 8 (GLOVE) ×2 IMPLANT
GLOVE BIOGEL PI INDICATOR 8 (GLOVE) ×4
GLOVE ECLIPSE 7.5 STRL STRAW (GLOVE) IMPLANT
GOWN STRL REUS W/ TWL LRG LVL3 (GOWN DISPOSABLE) ×3 IMPLANT
GOWN STRL REUS W/TWL LRG LVL3 (GOWN DISPOSABLE) ×9
KIT BASIN OR (CUSTOM PROCEDURE TRAY) ×3 IMPLANT
KIT ROOM TURNOVER OR (KITS) ×3 IMPLANT
LIGASURE IMPACT 36 18CM CVD LR (INSTRUMENTS) IMPLANT
NS IRRIG 1000ML POUR BTL (IV SOLUTION) ×6 IMPLANT
PACK GENERAL/GYN (CUSTOM PROCEDURE TRAY) ×3 IMPLANT
PAD ARMBOARD 7.5X6 YLW CONV (MISCELLANEOUS) ×3 IMPLANT
PENCIL BUTTON HOLSTER BLD 10FT (ELECTRODE) ×3 IMPLANT
SPECIMEN JAR MEDIUM (MISCELLANEOUS) IMPLANT
SPONGE LAP 18X18 X RAY DECT (DISPOSABLE) ×9 IMPLANT
STAPLER GUN LINEAR PROX 60 (STAPLE) ×3 IMPLANT
STAPLER PROXIMATE 75MM BLUE (STAPLE) ×3 IMPLANT
STAPLER VISISTAT 35W (STAPLE) ×3 IMPLANT
SUCTION POOLE TIP (SUCTIONS) ×3 IMPLANT
SUT NOVA NAB DX-16 0-1 5-0 T12 (SUTURE) ×3 IMPLANT
SUT PDS AB 1 TP1 96 (SUTURE) ×6 IMPLANT
SUT SILK 2 0 (SUTURE) ×3
SUT SILK 2 0 SH (SUTURE) ×3 IMPLANT
SUT SILK 2 0 SH CR/8 (SUTURE) ×3 IMPLANT
SUT SILK 2-0 18XBRD TIE 12 (SUTURE) ×1 IMPLANT
SUT SILK 3 0 (SUTURE) ×3
SUT SILK 3 0 SH CR/8 (SUTURE) ×6 IMPLANT
SUT SILK 3-0 18XBRD TIE 12 (SUTURE) ×1 IMPLANT
SUT VIC AB 1 CT1 18XCR BRD 8 (SUTURE) ×1 IMPLANT
SUT VIC AB 1 CT1 8-18 (SUTURE) ×3
TOWEL OR 17X24 6PK STRL BLUE (TOWEL DISPOSABLE) IMPLANT
TOWEL OR 17X26 10 PK STRL BLUE (TOWEL DISPOSABLE) IMPLANT
TRAY FOLEY CATH 14FRSI W/METER (CATHETERS) IMPLANT
TRAY FOLEY CATH SILVER 16FR LF (SET/KITS/TRAYS/PACK) ×3 IMPLANT
TUBING SUCTION BULK 100 FT (MISCELLANEOUS) ×3 IMPLANT
WATER STERILE IRR 1000ML POUR (IV SOLUTION) IMPLANT
YANKAUER SUCT BULB TIP NO VENT (SUCTIONS) ×3 IMPLANT

## 2016-06-07 NOTE — Anesthesia Preprocedure Evaluation (Addendum)
Anesthesia Evaluation  Patient identified by MRN, date of birth, ID band Patient awake    Reviewed: Allergy & Precautions, H&P , NPO status , [name] Chart, lab work & pertinent test results  Airway Mallampati: II  TM Distance: >3 FB Neck ROM: full    Dental   Pulmonary asthma , former smoker,    breath sounds clear to auscultation       Cardiovascular negative cardio ROS   Rhythm:regular Rate:Normal     Neuro/Psych  Headaches, Anxiety    GI/Hepatic   Endo/Other    Renal/GU Renal disease     Musculoskeletal   Abdominal   Peds  Hematology  (+) Blood dyscrasia, anemia ,   Anesthesia Other Findings   Reproductive/Obstetrics                            Anesthesia Physical Anesthesia Plan  ASA: II  Anesthesia Plan: General   Post-op Pain Management:    Induction: Intravenous  Airway Management Planned: Oral ETT  Additional Equipment:   Intra-op Plan:   Post-operative Plan: Extubation in OR  Informed Consent: I have reviewed the patients History and Physical, chart, labs and discussed the procedure including the risks, benefits and alternatives for the proposed anesthesia with the patient or authorized representative who has indicated his/her understanding and acceptance.     Plan Discussed with: CRNA, Anesthesiologist and Surgeon  Anesthesia Plan Comments:         Anesthesia Quick Evaluation

## 2016-06-07 NOTE — Op Note (Signed)
OPERATIVE REPORT  DATE OF OPERATION: 06/07/2016  PATIENT:  [name]  51 y.o. male  PRE-OPERATIVE DIAGNOSIS:  Ileostomy in place  POST-OPERATIVE DIAGNOSIS:  ileostomy takedown  INDICATION(S) FOR OPERATION:  Ileostomy in place  FINDINGS:  Lots of adhesions, anatomy otherwise ok  PROCEDURE:  Procedure(s): ILEOSTOMY TAKEDOWN EXTENSIVE ENTEROLYSIS  SURGEON:  Surgeon(s): Judeth Horn, MD  ASSISTANT: None  ANESTHESIA:   general  COMPLICATIONS:  Amall bowel enterotomy repaired x 1  EBL: 200 ml  BLOOD ADMINISTERED: none  DRAINS: Nasogastric Tube and Urinary Catheter (Foley)   SPECIMEN:  Source of Specimen:  colostomy site  COUNTS CORRECT:  YES  PROCEDURE DETAILS: The patient was taken to the operating room and placed on the table in the supine position. After an adequate general endotracheal anesthetic was administered a proper timeout was performed identifying the patient and procedure to be performed, then the ileostomy was closed using a running locking stitch of 2-0 silk.  He was subsequently prepped and draped in usual sterile manner. A midline incision was made using a #10 blade excising the previously widen midline scar. There were multiple nonabsorbable Novafil sutures which were in place which were cut out using suture scissors. We then used electrocautery to come through the fascia into a clear space which was dissected free of the omentum.  As we entered  the abdomen, we detached the omentum from both sides of the abdominal wall fascia. There were dense adhesions attaching the small bowel to itself and also to the omentum throughout the abdominal cavity. Approximately an hour to an hour and a half was spent taking down these adhesions safely. During one portion is small enterotomy was made in the small bowel that was repaired with 4 interrupted 3-0 silk sutures.  We were able to identify the right transverse colon which was on the right side of the abdomen and the  upper portion covered by dense adhesions. These were taken down exposing the Prolene sutures which marked the staple line. Once we had the colon adequately mobilize we took down the ileostomy by cutting around using electrocautery and detaching it from the surrounding subcutaneous tissue. We were able to pass it back into the perineal cavity then takedown significant adhesions which were limiting its mobility.  Once these adhesions were taken down we did a side-to-side anastomosis between the distal ileum and the right transverse colon using a GIA-75 stapler. The resulting expecting enterotomy was closed using a TX 60 stapler. We closed the mesentery using interrupted 2-0 silk sutures.  We subsequently irrigated the perineal cavity using 3-4 L of warm saline solution. Subsequent to that the surgeon in the assistant changed gowns and gloves prepare for closure.  The ileostomy site was closed using interrupted #1 Vicryl sutures. The midline fascia was closed using running looped #1 PDS suture with intervening internal retention sutures of #1 Novafil. The skin was closed using stainless steel staple and a quarter-inch Penrose drain was brought out the lateral aspect of the ileostomy site.  A sterile dressing was applied. All needle counts, sponge counts, and instrument counts were correct. PATIENT DISPOSITION:  PACU - hemodynamically stable.   [name] 2/22/20181:00 PM

## 2016-06-07 NOTE — Progress Notes (Signed)
CM received call from [employer] CM, [employer]. Lorie's contact:  [phone]), fax # for clinicals [phone]. Whitman Hero RN,BSN, CM

## 2016-06-07 NOTE — Anesthesia Procedure Notes (Signed)
Procedure Name: Intubation Date/Time: 06/07/2016 10:05 AM Performed by: Neldon Newport Pre-anesthesia Checklist: Timeout performed, Patient being monitored, Suction available, Emergency Drugs available and Patient identified Patient Re-evaluated:Patient Re-evaluated prior to inductionOxygen Delivery Method: Circle system utilized Preoxygenation: Pre-oxygenation with 100% oxygen Intubation Type: IV induction Ventilation: Mask ventilation without difficulty LMA: LMA inserted Laryngoscope Size: Mac and 3 Grade View: Grade I Tube type: Oral Tube size: 7.5 mm Number of attempts: 2 Placement Confirmation: breath sounds checked- equal and bilateral,  positive ETCO2 and ETT inserted through vocal cords under direct vision Secured at: 23 cm Tube secured with: Tape Dental Injury: Teeth and Oropharynx as per pre-operative assessment

## 2016-06-07 NOTE — Transfer of Care (Signed)
Immediate Anesthesia Transfer of Care Note  Patient: [name]  Procedure(s) Performed: Procedure(s): ILEOSTOMY TAKEDOWN (N/A)  Patient Location: PACU  Anesthesia Type:General  Level of Consciousness: awake, alert  and oriented  Airway & Oxygen Therapy: Patient Spontanous Breathing and Patient connected to nasal cannula oxygen  Post-op Assessment: Report given to RN, Post -op Vital signs reviewed and stable and Patient moving all extremities X 4  Post vital signs: Reviewed and stable  Last Vitals:  Vitals:   06/06/16 2201 06/07/16 0644  BP: 123/73 121/80  Pulse: 75 82  Resp: 18 18  Temp: 36.8 C 36.7 C    Last Pain:  Vitals:   06/07/16 0644  TempSrc: Oral  PainSc:       Patients Stated Pain Goal: 2 ([id number])  Complications: No apparent anesthesia complications

## 2016-06-07 NOTE — Progress Notes (Addendum)
PROGRESS NOTE    [name]  [id number] DOB: [birth year] DOA: 06/05/2016 PCP: No primary care provider on file.   Chief Complaint  Patient presents with  . Abnormal Lab    Brief Narrative:  HPI on 06/05/2016 by Dr. Lily Kocher [name] is a 51 y.o. 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  He also has a history of chronic pain with narcotic dependence and recurrent electrolyte abnormalities.  He reported for outpatient labs yesterday as part of his pre-op evaluation for planned ostomy Reversal.  He was found to have low sodium and potassium levels.  He was told to come to the ED for management.  Of note, he is supposed to be on an oral sodium/potassium supplement at home.  While in the ED he reported new pain in his lower abdomen that started in the past 1-2 days after sneezing.  He now has a palpable mass/area of herniation to the right of his midline abdominal incision.  No fevers.  He has had chills but no sweats.  He has intermittent light-headedness (which is not new); no LOC.  No chest pain or shortness of breath.  No nausea or vomiting.  Appetite has been stable. Assessment & Plan   Hypovolemic hyponatremia, hypokalemia secondary to excessive GI losses -Patient does have ileostomy (which is to be reversed on 2/22) -Sodium upon admission 117, currently 129 -Continue IVF -Potassium improving, currently 4.1 -Magnesium 1.6, will replace -Continue to monitor BMP  Abnormal findings on CT -General Surgery consulted and appreciated -Plan for reversal today 06/07/16 -No signs of sepsis at this point -Lactic acid normal -Monitor abdominal exam  Abnormal liver lesions -Needs MRI of liver at some point.   History of small bowel resection,  ostomy -Due for ostomy reversal today 06/07/2016 -Continue scheduled imodium, lomotil -General surgery consulted and appreciated  Chronic anemia -Hemoglobin currently 8.5, currently stable and at baseline -Continue iron supplement -monitor CBC  Chronic pain -continue pain control PRN  Anxiety -Xanax PRN  Moderate malnutrition -Nutrition consulted, continue supplements  DVT Prophylaxis  SCDs  Code Status: Full  Family Communication: Family  at bedside  Disposition Plan: Inpatient, surgery today  Consultants General surgery  Procedures  None  Antibiotics   Anti-infectives    Start     Dose/Rate Route Frequency Ordered Stop   06/07/16 0739  cefoTEtan (CEFOTAN) 2 g in dextrose 5 % 50 mL IVPB     2 g 100 mL/hr over 30 Minutes Intravenous On call to O.R. 06/07/16 0739 06/07/16 1009      Subjective:   [name] seen and examined today.  Patient states he is worried about his surgery but ready to get it over with.  Denies chest pain, shortness of breath, dizziness, headache.    Objective:   Vitals:   06/06/16 0547 06/06/16 1515 06/06/16 2201 06/07/16 0644  BP: (!) 93/58 113/66 123/73 121/80  Pulse: 75 73 75 82  Resp: 18 19 18 18   Temp: 97.7 F (36.5 C) 99.5 F (37.5 C) 98.3 F (36.8 C) 98.1 F (36.7 C)  TempSrc: Oral Oral Oral Oral  SpO2: 100% 100% 100% 100%  Weight:      Height:        Intake/Output Summary (Last 24 hours) at 06/07/16 1201 Last data filed at 06/07/16 1155  Gross per 24 hour  Intake             1000 ml  Output             6550 ml  Net            -5550 ml   Filed Weights   06/06/16 0107  Weight: 69.3 kg (152 lb 12.8 oz)    Exam  General: Well developed, well nourished, NAD, appears stated age  32: NCAT, mucous membranes moist.   Cardiovascular: S1 S2 auscultated, RRR, no murmurs  Respiratory: Clear to auscultation bilaterally with equal chest rise  Abdomen: Soft, RLQ TTP, nondistended, + bowel sounds,  ostomy  Extremities: warm dry without cyanosis clubbing or edema  Neuro: AAOx3, nonfocal  Psych: Normal affect and demeanor    Data Reviewed: I have personally reviewed following labs and imaging studies  CBC:  Recent Labs Lab 06/04/16 1402 06/05/16 1621 06/07/16 0555  WBC 8.6 8.0 6.7  NEUTROABS 4.8  --   --   HGB 8.6* 9.4* 8.5*  HCT 23.9* 26.2* 24.6*  MCV 82.4 82.9 85.4  PLT 204 222 [id number]   Basic Metabolic Panel:  Recent Labs Lab 06/04/16 1402 06/05/16 1621 06/06/16 0211 06/07/16 0555  NA 117* 122* 128* 129*  K 3.1* 2.6* 3.2* 4.1  CL 77* 82* 89* 94*  CO2 28 27 29 23   GLUCOSE 87 130* 105* 98  BUN 5* <5* <5* 6  CREATININE 0.91 1.01 0.85 0.94  CALCIUM 8.9 9.1 9.5 9.5  MG  --   --  1.8 1.6*   GFR: Estimated Creatinine Clearance: 92.2 mL/min (by C-G formula based on SCr of 0.94 mg/dL). Liver Function Tests:  Recent Labs Lab 06/05/16 1621  AST 32  ALT 21  ALKPHOS 99  BILITOT 0.4  PROT 7.1  ALBUMIN 4.0   No results for input(s): LIPASE, AMYLASE in the last 168 hours. No results for input(s): AMMONIA in the last 168 hours. Coagulation Profile: No results for input(s): INR, PROTIME in the last 168 hours. Cardiac Enzymes: No results for input(s): CKTOTAL, CKMB, CKMBINDEX, TROPONINI in the last 168 hours. BNP (last 3 results) No results for input(s): PROBNP in the last 8760 hours. HbA1C: No results for input(s): HGBA1C in the last 72 hours. CBG: No results for input(s): GLUCAP in the last 168 hours. Lipid Profile: No results for input(s): CHOL, HDL, LDLCALC, TRIG, CHOLHDL, LDLDIRECT in the last 72 hours. Thyroid Function Tests: No results for input(s): TSH, T4TOTAL, FREET4, T3FREE, THYROIDAB in the last 72 hours. Anemia Panel: No results for input(s): VITAMINB12, FOLATE, FERRITIN, TIBC, IRON, RETICCTPCT in the last 72 hours. Urine analysis:    Component Value Date/Time   COLORURINE COLORLESS (A) 06/05/2016 2122   APPEARANCEUR CLEAR 06/05/2016 2122    LABSPEC 1.000 (L) 06/05/2016 2122   PHURINE 7.0 06/05/2016 2122   GLUCOSEU NEGATIVE 06/05/2016 2122   HGBUR NEGATIVE 06/05/2016 2122   BILIRUBINUR NEGATIVE 06/05/2016 2122   KETONESUR NEGATIVE 06/05/2016 2122   PROTEINUR NEGATIVE 06/05/2016 2122   UROBILINOGEN 1.0 07/28/2007 1539   NITRITE NEGATIVE 06/05/2016 2122   LEUKOCYTESUR NEGATIVE 06/05/2016 2122   Sepsis Labs: @LABRCNTIP (procalcitonin:4,lacticidven:4)  ) Recent Results (from the past 240 hour(s))  Urine culture     Status: None   Collection Time: 06/05/16  9:22 PM  Result Value Ref Range Status   Specimen Description URINE, CLEAN CATCH  Final   Special Requests NONE  Final   Culture NO GROWTH  Final   Report Status  06/07/2016 FINAL  Final  Surgical pcr screen     Status: Abnormal   Collection Time: 06/07/16 12:12 AM  Result Value Ref Range Status   MRSA, PCR NEGATIVE NEGATIVE Final   Staphylococcus aureus POSITIVE (A) NEGATIVE Final    Comment:        The Xpert SA Assay (FDA approved for NASAL specimens in patients over 68 years of age), is one component of a comprehensive surveillance program.  Test performance has been validated by [facility] for patients greater than or equal to 2 year old. It is not intended to diagnose infection nor to guide or monitor treatment.       Radiology Studies: Ct Abdomen Pelvis W Contrast  Result Date: 06/05/2016 CLINICAL DATA:  Follow-up incarcerated ventral hernia distal to the ostomy EXAM: CT ABDOMEN AND PELVIS WITH CONTRAST TECHNIQUE: Multidetector CT imaging of the abdomen and pelvis was performed using the standard protocol following bolus administration of intravenous contrast. CONTRAST:  100 mL Isovue-300 intravenous COMPARISON:  02/25/2016 FINDINGS: Lower chest: Linear scarring in the left lower lobe. No acute infiltrate or effusion at the bilateral lung bases. The heart is nonenlarged. Hepatobiliary: Multiple hypodense liver lesions are again visualized with slight  increase in size of several lesions 8 lateral segment left hepatic lobe lesion measures 2.2 by 2 cm compared with 2.1 x 2.1 cm previously and demonstrates patchy internal enhancement. A lesion in the posterior right hepatic lobe measures 1 cm, compared with 7 mm previously. An irregular hypodense lesion in the medial segment of the left hepatic lobe is not clearly identified on the current study. No calcified gallstones. No biliary dilatation. Pancreas: Unremarkable. No pancreatic ductal dilatation or surrounding inflammatory changes. Spleen: Normal in size without focal abnormality. Adrenals/Urinary Tract: Adrenal glands are unremarkable. Kidneys are normal, without renal calculi, focal lesion, or hydronephrosis. Bladder is unremarkable. Stomach/Bowel: There is a right lower quadrant ostomy. Patient is status post ileocecal and distal small bowel resection. No parastomal hernia is visualized. There is no evidence for a small bowel obstruction. Collapsed blind ending large bowel with mild wall thickening as before. Vascular/Lymphatic: Aortic atherosclerosis. Stable multiple mildly enlarged upper abdominal lymph nodes. Reproductive: Prostate is unremarkable. Other: There is no free air or free fluid. Soft tissue thickening and edema/ inflammatory response around the right greater than left rectus sheath. Midline soft tissue thickening situated between the rectus muscles below the level of the umbilicus. No evidence for a soft tissue abscess. Musculoskeletal: Stable postoperative changes at L3-L4 and L4-L5. Additional lateral surgical plate and screw fixation partially visualized at T10. IMPRESSION: 1. Status post the ileocecal and distal small bowel resection with right lower quadrant ileostomy. No parastomal hernia is visualized. There is no bowel containing ventral hernia identified. 2. Slightly enlarged appearance of the inferior rectus muscles with subcutaneous edema and focal midline soft tissue thickening along  the midline infra umbilical abdominal wall, this could relate to postsurgical changes, or possibly infection/ phlegmon. There is some fluid around the right rectus muscle. There is no soft tissue abscess. 3. Multiple indeterminate low-density lesions in the liver, several of which have increased in size. Findings could be secondary to multifocal infection with neoplastic etiology not excluded. As previously recommended, nonemergent MRI follow-up may be performed when clinically feasible. Electronically Signed   By: Donavan Foil M.D.   On: 06/05/2016 22:32     Scheduled Meds: . [MAR Hold] Chlorhexidine Gluconate Cloth  6 each Topical Daily  . [MAR Hold] diphenoxylate-atropine  2 tablet Oral  QID  . [MAR Hold] feeding supplement (ENSURE ENLIVE)  237 mL Oral BID BM  . [MAR Hold] ferrous sulfate  325 mg Oral TID WC  . [MAR Hold] loperamide  8 mg Oral QID  . [MAR Hold] morphine  100 mg Oral Q12H  . [MAR Hold] mupirocin ointment  1 application Nasal BID  . [MAR Hold] potassium & sodium phosphates  1 packet Oral QID  . [MAR Hold] psyllium  1 packet Oral QID  . [MAR Hold] sodium chloride flush  3 mL Intravenous Q12H   Continuous Infusions: . lactated ringers 10 mL/hr at 06/07/16 0919     LOS: 1 day   Time Spent in minutes   30 minutes  Shalimar Mcclain D.O. on 06/07/2016 at 12:01 PM  Between 7am to 7pm - Pager - [phone]  After 7pm go to [email or web address] - password [email or web address]  And look for the night coverage person covering for me after hours  [facility] Office  [phone]

## 2016-06-07 NOTE — Progress Notes (Addendum)
Patient arrived from PACU. Honeycomb dressing to abdomin with old drainage. Penrose drain to abdomen. NG tube to low intermittent suction. Full Dose dilaudid PCA pump infusing, but patient stated it is not helping his pain. Patient takes long acting MS contin which is did not get this am r/t patient transferred to OR. Patient taught how to use PCA and incentive spirometer. Surigcal PA paged to make aware of patient's pain. Brooke, [place] stated she will look into orders for pain.

## 2016-06-07 NOTE — Progress Notes (Signed)
Patient for reversal of ileostomy today.    [name]. Dahlia Bailiff, MD, [place] [phone] [facility]

## 2016-06-07 NOTE — OR Nursing (Signed)
1/4" penrose drain left in abdominal incision on 06/07/16

## 2016-06-08 ENCOUNTER — Encounter (HOSPITAL_COMMUNITY): Payer: Self-pay | Admitting: General Surgery

## 2016-06-08 LAB — CBC
HCT: 25.3 % — ABNORMAL LOW (ref 39.0–52.0)
Hemoglobin: 8.7 g/dL — ABNORMAL LOW (ref 13.0–17.0)
MCH: 29.6 pg (ref 26.0–34.0)
MCHC: 34.4 g/dL (ref 30.0–36.0)
MCV: 86.1 fL (ref 78.0–100.0)
PLATELETS: 212 10*3/uL (ref 150–400)
RBC: 2.94 MIL/uL — AB (ref 4.22–5.81)
RDW: 14.1 % (ref 11.5–15.5)
WBC: 9.7 10*3/uL (ref 4.0–10.5)

## 2016-06-08 LAB — BASIC METABOLIC PANEL
ANION GAP: 8 (ref 5–15)
BUN: 8 mg/dL (ref 6–20)
CO2: 23 mmol/L (ref 22–32)
Calcium: 8.6 mg/dL — ABNORMAL LOW (ref 8.9–10.3)
Chloride: 103 mmol/L (ref 101–111)
Creatinine, Ser: 1.07 mg/dL (ref 0.61–1.24)
Glucose, Bld: 118 mg/dL — ABNORMAL HIGH (ref 65–99)
POTASSIUM: 4.4 mmol/L (ref 3.5–5.1)
Sodium: 134 mmol/L — ABNORMAL LOW (ref 135–145)

## 2016-06-08 LAB — MAGNESIUM: Magnesium: 1.8 mg/dL (ref 1.7–2.4)

## 2016-06-08 MED ORDER — ONDANSETRON HCL 4 MG/2ML IJ SOLN: 4.0000 mg | Freq: Four times a day (QID) | INTRAMUSCULAR | Status: DC | PRN

## 2016-06-08 MED ORDER — MORPHINE SULFATE 2 MG/ML IV SOLN
INTRAVENOUS | Status: DC
  Administered 2016-06-08: 22.4 mg via INTRAVENOUS
  Administered 2016-06-08: 3 mg via INTRAVENOUS
  Administered 2016-06-09: 22.5 mg via INTRAVENOUS
  Administered 2016-06-09: 24 mg via INTRAVENOUS
  Administered 2016-06-09: 2 mg via INTRAVENOUS
  Administered 2016-06-09: 15:00:00 via INTRAVENOUS
  Administered 2016-06-09: 22.5 mg via INTRAVENOUS
  Administered 2016-06-10: 25.5 mg via INTRAVENOUS
  Administered 2016-06-10: 12 mg via INTRAVENOUS
  Administered 2016-06-10: 2 mg via INTRAVENOUS
  Administered 2016-06-10: 22.69 mg via INTRAVENOUS
  Administered 2016-06-10: 18:00:00 via INTRAVENOUS
  Administered 2016-06-11: 3 mg via INTRAVENOUS
  Filled 2016-06-08 (×5): qty 30

## 2016-06-08 MED ORDER — DIPHENHYDRAMINE HCL 12.5 MG/5ML PO ELIX: 12.5000 mg | ORAL_SOLUTION | Freq: Four times a day (QID) | ORAL | Status: DC | PRN

## 2016-06-08 MED ORDER — SODIUM CHLORIDE 0.9% FLUSH: 9.0000 mL | INTRAVENOUS | Status: DC | PRN

## 2016-06-08 MED ORDER — DIPHENHYDRAMINE HCL 50 MG/ML IJ SOLN: 12.5000 mg | Freq: Four times a day (QID) | INTRAMUSCULAR | Status: DC | PRN

## 2016-06-08 MED ORDER — NALOXONE HCL 0.4 MG/ML IJ SOLN: 0.4000 mg | INTRAMUSCULAR | Status: DC | PRN

## 2016-06-08 NOTE — Progress Notes (Signed)
2.5mg  of hydromorphone from PCA wasted with Alvis Lemmings RN.

## 2016-06-08 NOTE — Progress Notes (Signed)
[facility] Progress Note  1 Day Post-Op  Subjective: Pt states no flatus or BM. He states his pain is not well controlled. No acute events overnight.   Objective: Vital signs in last 24 hours: Temp:  [97 F (36.1 C)-99.3 F (37.4 C)] 99.3 F (37.4 C) (02/23 0513) Pulse Rate:  [76-88] 76 (02/23 0513) Resp:  [12-28] 20 (02/23 0810) BP: (120-158)/(57-98) 120/98 (02/23 0513) SpO2:  [98 %-100 %] 98 % (02/23 0810) Last BM Date: 06/07/16  Intake/Output from previous day: 02/22 0701 - 02/23 0700 In: 3725 [P.O.:30; I.V.:3595; IV Piggyback:100] Out: 1280 [Urine:1080; Blood:200] Intake/Output this shift: No intake/output data recorded.  PE: Gen:  Alert, NAD, pleasant, cooperative, lying in bed, NGT in place Card:  RRR, no M/G/R heard Pulm:  Rate and effort normal Abd: Soft, nondistended, no BS, incisions C/D/I with honeycomb, drain with minimal sanguinous drainage, appropriate generalized TTP Skin: not diaphoretic, warm and dry  Lab Results:   Recent Labs  06/07/16 0555 06/08/16 0552  WBC 6.7 9.7  HGB 8.5* 8.7*  HCT 24.6* 25.3*  PLT 208 212   BMET  Recent Labs  06/07/16 0555 06/08/16 0552  NA 129* 134*  K 4.1 4.4  CL 94* 103  CO2 23 23  GLUCOSE 98 118*  BUN 6 8  CREATININE 0.94 1.07  CALCIUM 9.5 8.6*   PT/INR No results for input(s): LABPROT, INR in the last 72 hours. CMP     Component Value Date/Time   NA 134 (L) 06/08/2016 0552   K 4.4 06/08/2016 0552   CL 103 06/08/2016 0552   CO2 23 06/08/2016 0552   GLUCOSE 118 (H) 06/08/2016 0552   BUN 8 06/08/2016 0552   CREATININE 1.07 06/08/2016 0552   CALCIUM 8.6 (L) 06/08/2016 0552   PROT 7.1 06/05/2016 1621   ALBUMIN 4.0 06/05/2016 1621   AST 32 06/05/2016 1621   ALT 21 06/05/2016 1621   ALKPHOS 99 06/05/2016 1621   BILITOT 0.4 06/05/2016 1621   GFRNONAA >60 06/08/2016 0552   GFRAA >60 06/08/2016 0552   Lipase     Component Value Date/Time   LIPASE 16 02/08/2016 0412        Studies/Results: Dg Abd Portable 1v  Result Date: 06/07/2016 CLINICAL DATA:  NG tube placement EXAM: PORTABLE ABDOMEN - 1 VIEW COMPARISON:  06/05/2016 FINDINGS: There is normal small bowel gas pattern. Midline abdominal wall skin staples are noted. There is NG tube coiled within stomach with tip in proximal stomach laterally. IMPRESSION: NG tube coiled itself within stomach with tip in proximal stomach laterally. Electronically Signed   By: Lahoma Crocker M.D.   On: 06/07/2016 14:06    Anti-infectives: Anti-infectives    Start     Dose/Rate Route Frequency Ordered Stop   06/07/16 2200  cefoTEtan (CEFOTAN) 2 g in dextrose 5 % 50 mL IVPB     2 g 100 mL/hr over 30 Minutes Intravenous Every 12 hours 06/07/16 1433 06/07/16 2233   06/07/16 0739  cefoTEtan (CEFOTAN) 2 g in dextrose 5 % 50 mL IVPB     2 g 100 mL/hr over 30 Minutes Intravenous On call to O.R. 06/07/16 0739 06/07/16 1009       Assessment/Plan  Hypovolemic hyponatremia, hypokalemia secondary to excessive GI losses - resolving Chronic anemia - stable Chronic pain Moderate malnutrition   History of small bowel resection, ostomy S/P ileostomy takedown, Dr. Hulen Skains, 06/07/16 - await return of bowel function - encourage ambulation - continue PCA pump - continue NGT to  LIWS, chips and sips are ok   LOS: 2 days    Kalman Drape , [facility] 06/08/2016, 11:21 AM Pager: [phone] Consults: [phone] Mon-Fri 7:00 am-4:30 pm Sat-Sun 7:00 am-11:30 am

## 2016-06-08 NOTE — Progress Notes (Signed)
PROGRESS NOTE    [name]  [id number] DOB: 10-Sep-1965 DOA: 06/05/2016 PCP: No primary care provider on file.   Chief Complaint  Patient presents with  . Abnormal Lab    Brief Narrative:  HPI on 06/05/2016 by Dr. Lily Kocher [name] is a 51 y.o. 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  He also has a history of chronic pain with narcotic dependence and recurrent electrolyte abnormalities.  He reported for outpatient labs yesterday as part of his pre-op evaluation for planned ostomy Reversal.  He was found to have low sodium and potassium levels.  He was told to come to the ED for management.  Of note, he is supposed to be on an oral sodium/potassium supplement at home.  While in the ED he reported new pain in his lower abdomen that started in the past 1-2 days after sneezing.  He now has a palpable mass/area of herniation to the right of his midline abdominal incision.  No fevers.  He has had chills but no sweats.  He has intermittent light-headedness (which is not new); no LOC.  No chest pain or shortness of breath.  No nausea or vomiting.  Appetite has been stable. Assessment & Plan   Hypovolemic hyponatremia, hypokalemia secondary to excessive GI losses -Patient had ileostomy  -Sodium upon admission 117, currently 134 -Continue IVF -Potassium improving, currently 4.4 -Magnesium 1.8 -Continue to monitor BMP  Abnormal findings on CT -General Surgery consulted and appreciated -Reversal yesterday -No signs of sepsis at this point -Lactic acid normal -Monitor abdominal exam  Abnormal liver lesions -Needs MRI of liver at some point.   History of small bowel resection, ostomy -S/p reversal 06/07/2016 -General surgery consulted and  appreciated -Patient states dilaudid PCA pump is not helping his pain.  -Will transition to morphine PCA -Has NG tube in place  Chronic anemia -Hemoglobin currently 8.7, currently stable and at baseline -Continue iron supplement when able  -monitor CBC  Chronic pain -continue pain control PRN- as above  Anxiety  Moderate malnutrition -Nutrition consulted, continue supplements when able   DVT Prophylaxis  SCDs  Code Status: Full  Family Communication:  None at bedside  Disposition Plan: Admitted.   Consultants General surgery  Procedures  Reversal of ileostomy  Antibiotics   Anti-infectives    Start     Dose/Rate Route Frequency Ordered Stop   06/07/16 2200  cefoTEtan (CEFOTAN) 2 g in dextrose 5 % 50 mL IVPB     2 g 100 mL/hr over 30 Minutes Intravenous Every 12 hours 06/07/16 1433 06/07/16 2233   06/07/16 0739  cefoTEtan (CEFOTAN) 2 g in dextrose 5 % 50 mL IVPB     2 g 100 mL/hr over 30 Minutes Intravenous On call to O.R. 06/07/16 0739 06/07/16 1009      Subjective:   [name] seen and examined today.  Patient states he is sore. Feels dilaudid PCA not helping his pain. Denies chest pain, shortness of breath, dizziness, headache.    Objective:   Vitals:   06/08/16 0513 06/08/16 0732 06/08/16 0810 06/08/16 1120  BP: (!) 120/98     Pulse: 76     Resp: 18 18 20  (!) 23  Temp: 99.3 F (37.4 C)     TempSrc: Oral     SpO2: 100% 100%  98% 98%  Weight:      Height:        Intake/Output Summary (Last 24 hours) at 06/08/16 1141 Last data filed at 06/08/16 1100  Gross per 24 hour  Intake             2725 ml  Output              630 ml  Net             2095 ml   Filed Weights   06/06/16 0107  Weight: 69.3 kg (152 lb 12.8 oz)    Exam  General: Well developed, well nourished, NAD, appears stated age  56: NCAT, mucous membranes moist. NGTube in place  Cardiovascular: S1 S2 auscultated, RRR, no murmurs  Respiratory: Clear to auscultation  bilaterally with equal chest rise  Abdomen: Soft, diffusely TTP, nondistended, honeycomb dressing in place-mid abdomen, drain  Extremities: warm dry without cyanosis clubbing or edema  Neuro: AAOx3, nonfocal  Psych: Normal affect and demeanor    Data Reviewed: I have personally reviewed following labs and imaging studies  CBC:  Recent Labs Lab 06/04/16 1402 06/05/16 1621 06/07/16 0555 06/08/16 0552  WBC 8.6 8.0 6.7 9.7  NEUTROABS 4.8  --   --   --   HGB 8.6* 9.4* 8.5* 8.7*  HCT 23.9* 26.2* 24.6* 25.3*  MCV 82.4 82.9 85.4 86.1  PLT 204 222 208 [id number]   Basic Metabolic Panel:  Recent Labs Lab 06/04/16 1402 06/05/16 1621 06/06/16 0211 06/07/16 0555 06/08/16 0552  NA 117* 122* 128* 129* 134*  K 3.1* 2.6* 3.2* 4.1 4.4  CL 77* 82* 89* 94* 103  CO2 28 27 29 23 23   GLUCOSE 87 130* 105* 98 118*  BUN 5* <5* <5* 6 8  CREATININE 0.91 1.01 0.85 0.94 1.07  CALCIUM 8.9 9.1 9.5 9.5 8.6*  MG  --   --  1.8 1.6* 1.8   GFR: Estimated Creatinine Clearance: 81 mL/min (by C-G formula based on SCr of 1.07 mg/dL). Liver Function Tests:  Recent Labs Lab 06/05/16 1621  AST 32  ALT 21  ALKPHOS 99  BILITOT 0.4  PROT 7.1  ALBUMIN 4.0   No results for input(s): LIPASE, AMYLASE in the last 168 hours. No results for input(s): AMMONIA in the last 168 hours. Coagulation Profile: No results for input(s): INR, PROTIME in the last 168 hours. Cardiac Enzymes: No results for input(s): CKTOTAL, CKMB, CKMBINDEX, TROPONINI in the last 168 hours. BNP (last 3 results) No results for input(s): PROBNP in the last 8760 hours. HbA1C: No results for input(s): HGBA1C in the last 72 hours. CBG: No results for input(s): GLUCAP in the last 168 hours. Lipid Profile: No results for input(s): CHOL, HDL, LDLCALC, TRIG, CHOLHDL, LDLDIRECT in the last 72 hours. Thyroid Function Tests: No results for input(s): TSH, T4TOTAL, FREET4, T3FREE, THYROIDAB in the last 72 hours. Anemia Panel: No results for  input(s): VITAMINB12, FOLATE, FERRITIN, TIBC, IRON, RETICCTPCT in the last 72 hours. Urine analysis:    Component Value Date/Time   COLORURINE COLORLESS (A) 06/05/2016 2122   APPEARANCEUR CLEAR 06/05/2016 2122   LABSPEC 1.000 (L) 06/05/2016 2122   PHURINE 7.0 06/05/2016 2122   GLUCOSEU NEGATIVE 06/05/2016 2122   HGBUR NEGATIVE 06/05/2016 2122   BILIRUBINUR NEGATIVE 06/05/2016 2122   KETONESUR NEGATIVE 06/05/2016 2122   PROTEINUR NEGATIVE 06/05/2016 2122   UROBILINOGEN 1.0 07/28/2007 1539   NITRITE NEGATIVE 06/05/2016 2122   LEUKOCYTESUR NEGATIVE 06/05/2016 2122   Sepsis Labs: @LABRCNTIP (procalcitonin:4,lacticidven:4)  )  Recent Results (from the past 240 hour(s))  Urine culture     Status: None   Collection Time: 06/05/16  9:22 PM  Result Value Ref Range Status   Specimen Description URINE, CLEAN CATCH  Final   Special Requests NONE  Final   Culture NO GROWTH  Final   Report Status 06/07/2016 FINAL  Final  Surgical pcr screen     Status: Abnormal   Collection Time: 06/07/16 12:12 AM  Result Value Ref Range Status   MRSA, PCR NEGATIVE NEGATIVE Final   Staphylococcus aureus POSITIVE (A) NEGATIVE Final    Comment:        The Xpert SA Assay (FDA approved for NASAL specimens in patients over 53 years of age), is one component of a comprehensive surveillance program.  Test performance has been validated by [facility] for patients greater than or equal to 65 year old. It is not intended to diagnose infection nor to guide or monitor treatment.       Radiology Studies: Dg Abd Portable 1v  Result Date: 06/07/2016 CLINICAL DATA:  NG tube placement EXAM: PORTABLE ABDOMEN - 1 VIEW COMPARISON:  06/05/2016 FINDINGS: There is normal small bowel gas pattern. Midline abdominal wall skin staples are noted. There is NG tube coiled within stomach with tip in proximal stomach laterally. IMPRESSION: NG tube coiled itself within stomach with tip in proximal stomach laterally.  Electronically Signed   By: Lahoma Crocker M.D.   On: 06/07/2016 14:06     Scheduled Meds: . Chlorhexidine Gluconate Cloth  6 each Topical Daily  . enoxaparin (LOVENOX) injection  40 mg Subcutaneous Q24H  . HYDROmorphone   Intravenous Q4H  . ketorolac  15 mg Intravenous Q6H  . mupirocin ointment  1 application Nasal BID  . sodium chloride flush  3 mL Intravenous Q12H   Continuous Infusions: . dextrose 5 % and 0.9 % NaCl with KCl 20 mEq/L 150 mL/hr at 06/08/16 0737     LOS: 2 days   Time Spent in minutes   30 minutes  Jaely Silman D.O. on 06/08/2016 at 11:41 AM  Between 7am to 7pm - Pager - [phone]  After 7pm go to [email or web address] - password [email or web address]  And look for the night coverage person covering for me after hours  [facility] Office  [phone]

## 2016-06-08 NOTE — Progress Notes (Signed)
Hydromorphone 1mg /ml discontinued, 3.3mg  used by patient before d/c. Wasted 17mg  of hydromorphone with Alex Gardener, RN.

## 2016-06-08 NOTE — Progress Notes (Signed)
Clinicals (2/20-2-22/2018) faxed to [employer] CM, [employer] @ [phone] per CM. Whitman Hero RN, BSN,CM

## 2016-06-08 NOTE — Progress Notes (Signed)
Patient refusing bed alarm at this time, says he wants to be left alone and sitting on the side of the bed so he can pee.

## 2016-06-09 LAB — CBC
HCT: 21.8 % — ABNORMAL LOW (ref 39.0–52.0)
Hemoglobin: 7.2 g/dL — ABNORMAL LOW (ref 13.0–17.0)
MCH: 29.1 pg (ref 26.0–34.0)
MCHC: 33 g/dL (ref 30.0–36.0)
MCV: 88.3 fL (ref 78.0–100.0)
PLATELETS: 212 10*3/uL (ref 150–400)
RBC: 2.47 MIL/uL — AB (ref 4.22–5.81)
RDW: 14.3 % (ref 11.5–15.5)
WBC: 8 10*3/uL (ref 4.0–10.5)

## 2016-06-09 LAB — BASIC METABOLIC PANEL
ANION GAP: 7 (ref 5–15)
BUN: 9 mg/dL (ref 6–20)
CO2: 22 mmol/L (ref 22–32)
Calcium: 8.4 mg/dL — ABNORMAL LOW (ref 8.9–10.3)
Chloride: 109 mmol/L (ref 101–111)
Creatinine, Ser: 0.89 mg/dL (ref 0.61–1.24)
GFR calc Af Amer: >60 mL/min (ref >60)
Glucose, Bld: 110 mg/dL — ABNORMAL HIGH (ref 65–99)
POTASSIUM: 4.1 mmol/L (ref 3.5–5.1)
SODIUM: 138 mmol/L (ref 135–145)

## 2016-06-09 NOTE — Progress Notes (Signed)
NG tube removed per order.  Pt tolerated well.

## 2016-06-09 NOTE — Progress Notes (Signed)
Pt refused bed alarm to be on despite education.

## 2016-06-09 NOTE — Progress Notes (Signed)
[facility]/[name] Progress Note 2 Days Post-Op  Subjective: Patient looks great.  Objective: Vital signs in last 24 hours: Temp:  [98.2 F (36.8 C)-100.6 F (38.1 C)] 99.2 F (37.3 C) (02/24 0701) Pulse Rate:  [76-92] 82 (02/24 0701) Resp:  [16-23] 18 (02/24 0701) BP: (134-138)/(63-72) 138/68 (02/24 0701) SpO2:  [94 %-100 %] 97 % (02/24 0701) Last BM Date: 06/05/16  Intake/Output from previous day: 02/23 0701 - 02/24 0700 In: 1142.5 [I.V.:1142.5] Out: 300 [Urine:300] Intake/Output this shift: Total I/O In: 0  Out: 650 [Urine:650]  General: No acute distress.  Seems to be doing better with morphine PCA  Lungs: clear  Abd: Soft, good bowel sounds.  Has passed flatus twoice  Extremities: No cahgnes  Neuro: Intact  Lab Results:  @LABLAST2 (wbc:2,hgb:2,hct:2,plt:2) BMET ) Recent Labs  06/08/16 0552 06/09/16 0450  NA 134* 138  K 4.4 4.1  CL 103 109  CO2 23 22  GLUCOSE 118* 110*  BUN 8 9  CREATININE 1.07 0.89  CALCIUM 8.6* 8.4*   PT/INR No results for input(s): LABPROT, INR in the last 72 hours. ABG No results for input(s): PHART, HCO3 in the last 72 hours.  Invalid input(s): PCO2, PO2  Studies/Results: Dg Abd Portable 1v  Result Date: 06/07/2016 CLINICAL DATA:  NG tube placement EXAM: PORTABLE ABDOMEN - 1 VIEW COMPARISON:  06/05/2016 FINDINGS: There is normal small bowel gas pattern. Midline abdominal wall skin staples are noted. There is NG tube coiled within stomach with tip in proximal stomach laterally. IMPRESSION: NG tube coiled itself within stomach with tip in proximal stomach laterally. Electronically Signed   By: Lahoma Crocker M.D.   On: 06/07/2016 14:06    Anti-infectives: Anti-infectives    Start     Dose/Rate Route Frequency Ordered Stop   06/07/16 2200  cefoTEtan (CEFOTAN) 2 g in dextrose 5 % 50 mL IVPB     2 g 100 mL/hr over 30 Minutes Intravenous Every 12 hours 06/07/16 1433 06/07/16 2233   06/07/16 0739  cefoTEtan (CEFOTAN) 2 g in dextrose 5 % 50  mL IVPB     2 g 100 mL/hr over 30 Minutes Intravenous On call to O.R. 06/07/16 0739 06/07/16 1009      Assessment/Plan: s/p Procedure(s): ILEOSTOMY TAKEDOWN d/c foley Advance diet Discontinue NGT  LOS: 3 days   Kathryne Eriksson. Dahlia Bailiff, MD, FACS [phone] [facility] Surgery 06/09/2016

## 2016-06-09 NOTE — Progress Notes (Signed)
PROGRESS NOTE    [name]  [id number] DOB: 03-02-1966 DOA: 06/05/2016 PCP: No primary care provider on file.   Chief Complaint  Patient presents with  . Abnormal Lab    Brief Narrative:  HPI on 06/05/2016 by Dr. Lily Kocher [name] is a 51 y.o. 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  He also has a history of chronic pain with narcotic dependence and recurrent electrolyte abnormalities.  He reported for outpatient labs yesterday as part of his pre-op evaluation for planned ostomy Reversal.  He was found to have low sodium and potassium levels.  He was told to come to the ED for management.  Of note, he is supposed to be on an oral sodium/potassium supplement at home.  While in the ED he reported new pain in his lower abdomen that started in the past 1-2 days after sneezing.  He now has a palpable mass/area of herniation to the right of his midline abdominal incision.  No fevers.  He has had chills but no sweats.  He has intermittent light-headedness (which is not new); no LOC.  No chest pain or shortness of breath.  No nausea or vomiting.  Appetite has been stable. Assessment & Plan   Hypovolemic hyponatremia, hypokalemia secondary to excessive GI losses -Patient had ileostomy  -Sodium upon admission 117, currently 138 -Continue IVF -Potassium currently 4.1 -Magnesium 1.8 -Continue to monitor BMP  Abnormal findings on CT -General Surgery consulted and appreciated -Reversal yesterday -No signs of sepsis at this point -Lactic acid normal  Abnormal liver lesions -Needs MRI of liver at some point.   History of small bowel resection, ostomy -S/p reversal 06/07/2016 -General surgery consulted and appreciated -Patient stated dilaudid PCA  pump is not helping his pain.  -Transitioned to morphine PCA -Has NG tube in place, will be discontinued today -Per surgery, will advance diet today  Chronic anemia -Hemoglobin currently 7.2 (baseline 8-9) -Continue iron supplement when able  -monitor CBC  Chronic pain -continue pain control PRN- as above  Anxiety  Moderate malnutrition -Nutrition consulted, continue supplements when able   DVT Prophylaxis  SCDs  Code Status: Full  Family Communication:  None at bedside  Disposition Plan: Admitted.   Consultants General surgery  Procedures  Reversal of ileostomy  Antibiotics   Anti-infectives    Start     Dose/Rate Route Frequency Ordered Stop   06/07/16 2200  cefoTEtan (CEFOTAN) 2 g in dextrose 5 % 50 mL IVPB     2 g 100 mL/hr over 30 Minutes Intravenous Every 12 hours 06/07/16 1433 06/07/16 2233   06/07/16 0739  cefoTEtan (CEFOTAN) 2 g in dextrose 5 % 50 mL IVPB     2 g 100 mL/hr over 30 Minutes Intravenous On call to O.R. 06/07/16 0739 06/07/16 1009      Subjective:   [name] seen and examined today.  Patient feels pain is controlled.  Does endorse flatus. Denies nausea, vomiting. Denies chest pain, shortness of breath, dizziness, headache.    Objective:   Vitals:   06/09/16 0206 06/09/16 0211 06/09/16 0701 06/09/16 0800  BP:   138/68   Pulse:   82   Resp: 16 (!) 23 18 20   Temp:   99.2 F (37.3 C)   TempSrc:   Oral   SpO2: 96%  94% 97% 97%  Weight:      Height:        Intake/Output Summary (Last 24 hours) at 06/09/16 1155 Last data filed at 06/09/16 [id number]  Gross per 24 hour  Intake           1142.5 ml  Output              950 ml  Net            192.5 ml   Filed Weights   06/06/16 0107  Weight: 69.3 kg (152 lb 12.8 oz)    Exam  General: Well developed, well nourished, NAD, appears stated age  51: NCAT, mucous membranes moist. NGTube in place  Cardiovascular: S1 S2 auscultated, RRR, no murmurs  Respiratory: Clear to  auscultation bilaterally with equal chest rise  Abdomen: Soft, diffusely TTP, nondistended, honeycomb dressing in place-mid abdomen, drain  Extremities: warm dry without cyanosis clubbing or edema  Neuro: AAOx3, nonfocal  Psych: Normal affect and demeanor, pleasant    Data Reviewed: I have personally reviewed following labs and imaging studies  CBC:  Recent Labs Lab 06/04/16 1402 06/05/16 1621 06/07/16 0555 06/08/16 0552 06/09/16 0450  WBC 8.6 8.0 6.7 9.7 8.0  NEUTROABS 4.8  --   --   --   --   HGB 8.6* 9.4* 8.5* 8.7* 7.2*  HCT 23.9* 26.2* 24.6* 25.3* 21.8*  MCV 82.4 82.9 85.4 86.1 88.3  PLT 204 222 208 212 [id number]   Basic Metabolic Panel:  Recent Labs Lab 06/05/16 1621 06/06/16 0211 06/07/16 0555 06/08/16 0552 06/09/16 0450  NA 122* 128* 129* 134* 138  K 2.6* 3.2* 4.1 4.4 4.1  CL 82* 89* 94* 103 109  CO2 27 29 23 23 22   GLUCOSE 130* 105* 98 118* 110*  BUN <5* <5* 6 8 9   CREATININE 1.01 0.85 0.94 1.07 0.89  CALCIUM 9.1 9.5 9.5 8.6* 8.4*  MG  --  1.8 1.6* 1.8  --    GFR: Estimated Creatinine Clearance: 97.3 mL/min (by C-G formula based on SCr of 0.89 mg/dL). Liver Function Tests:  Recent Labs Lab 06/05/16 1621  AST 32  ALT 21  ALKPHOS 99  BILITOT 0.4  PROT 7.1  ALBUMIN 4.0   No results for input(s): LIPASE, AMYLASE in the last 168 hours. No results for input(s): AMMONIA in the last 168 hours. Coagulation Profile: No results for input(s): INR, PROTIME in the last 168 hours. Cardiac Enzymes: No results for input(s): CKTOTAL, CKMB, CKMBINDEX, TROPONINI in the last 168 hours. BNP (last 3 results) No results for input(s): PROBNP in the last 8760 hours. HbA1C: No results for input(s): HGBA1C in the last 72 hours. CBG: No results for input(s): GLUCAP in the last 168 hours. Lipid Profile: No results for input(s): CHOL, HDL, LDLCALC, TRIG, CHOLHDL, LDLDIRECT in the last 72 hours. Thyroid Function Tests: No results for input(s): TSH, T4TOTAL, FREET4,  T3FREE, THYROIDAB in the last 72 hours. Anemia Panel: No results for input(s): VITAMINB12, FOLATE, FERRITIN, TIBC, IRON, RETICCTPCT in the last 72 hours. Urine analysis:    Component Value Date/Time   COLORURINE COLORLESS (A) 06/05/2016 2122   APPEARANCEUR CLEAR 06/05/2016 2122   LABSPEC 1.000 (L) 06/05/2016 2122   PHURINE 7.0 06/05/2016 2122   GLUCOSEU NEGATIVE 06/05/2016 2122   HGBUR NEGATIVE 06/05/2016 2122   BILIRUBINUR NEGATIVE 06/05/2016 2122   KETONESUR NEGATIVE 06/05/2016 2122   PROTEINUR NEGATIVE 06/05/2016 2122   UROBILINOGEN 1.0 07/28/2007 1539   NITRITE NEGATIVE 06/05/2016 2122  LEUKOCYTESUR NEGATIVE 06/05/2016 2122   Sepsis Labs: @LABRCNTIP (procalcitonin:4,lacticidven:4)  ) Recent Results (from the past 240 hour(s))  Urine culture     Status: None   Collection Time: 06/05/16  9:22 PM  Result Value Ref Range Status   Specimen Description URINE, CLEAN CATCH  Final   Special Requests NONE  Final   Culture NO GROWTH  Final   Report Status 06/07/2016 FINAL  Final  Surgical pcr screen     Status: Abnormal   Collection Time: 06/07/16 12:12 AM  Result Value Ref Range Status   MRSA, PCR NEGATIVE NEGATIVE Final   Staphylococcus aureus POSITIVE (A) NEGATIVE Final    Comment:        The Xpert SA Assay (FDA approved for NASAL specimens in patients over 77 years of age), is one component of a comprehensive surveillance program.  Test performance has been validated by [facility] for patients greater than or equal to 51 year old. It is not intended to diagnose infection nor to guide or monitor treatment.       Radiology Studies: Dg Abd Portable 1v  Result Date: 06/07/2016 CLINICAL DATA:  NG tube placement EXAM: PORTABLE ABDOMEN - 1 VIEW COMPARISON:  06/05/2016 FINDINGS: There is normal small bowel gas pattern. Midline abdominal wall skin staples are noted. There is NG tube coiled within stomach with tip in proximal stomach laterally. IMPRESSION: NG tube coiled  itself within stomach with tip in proximal stomach laterally. Electronically Signed   By: Lahoma Crocker M.D.   On: 06/07/2016 14:06     Scheduled Meds: . Chlorhexidine Gluconate Cloth  6 each Topical Daily  . enoxaparin (LOVENOX) injection  40 mg Subcutaneous Q24H  . ketorolac  15 mg Intravenous Q6H  . morphine   Intravenous Q4H  . mupirocin ointment  1 application Nasal BID  . sodium chloride flush  3 mL Intravenous Q12H   Continuous Infusions: . dextrose 5 % and 0.9 % NaCl with KCl 20 mEq/L 75 mL/hr at 06/09/16 0941     LOS: 3 days   Time Spent in minutes   30 minutes  Shauntea Lok D.O. on 06/09/2016 at 11:55 AM  Between 7am to 7pm - Pager - [phone]  After 7pm go to [email or web address] - password [email or web address]  And look for the night coverage person covering for me after hours  [facility] Office  [phone]

## 2016-06-10 LAB — BASIC METABOLIC PANEL
ANION GAP: 8 (ref 5–15)
BUN: 10 mg/dL (ref 6–20)
CHLORIDE: 106 mmol/L (ref 101–111)
CO2: 22 mmol/L (ref 22–32)
Calcium: 8.4 mg/dL — ABNORMAL LOW (ref 8.9–10.3)
Creatinine, Ser: 0.74 mg/dL (ref 0.61–1.24)
Glucose, Bld: 96 mg/dL (ref 65–99)
POTASSIUM: 3.6 mmol/L (ref 3.5–5.1)
SODIUM: 136 mmol/L (ref 135–145)

## 2016-06-10 LAB — CBC
HCT: 21.2 % — ABNORMAL LOW (ref 39.0–52.0)
HEMOGLOBIN: 7.1 g/dL — AB (ref 13.0–17.0)
MCH: 29.7 pg (ref 26.0–34.0)
MCHC: 33.5 g/dL (ref 30.0–36.0)
MCV: 88.7 fL (ref 78.0–100.0)
PLATELETS: 196 10*3/uL (ref 150–400)
RBC: 2.39 MIL/uL — AB (ref 4.22–5.81)
RDW: 14.2 % (ref 11.5–15.5)
WBC: 4.6 10*3/uL (ref 4.0–10.5)

## 2016-06-10 MED ORDER — OXYCODONE HCL 5 MG PO TABS
5.0000 mg | ORAL_TABLET | Freq: Four times a day (QID) | ORAL | Status: DC | PRN
  Administered 2016-06-11 – 2016-06-12 (×2): 5 mg via ORAL
  Filled 2016-06-10 (×2): qty 1

## 2016-06-10 MED ORDER — KETOROLAC TROMETHAMINE 15 MG/ML IJ SOLN
15.0000 mg | Freq: Four times a day (QID) | INTRAMUSCULAR | Status: DC
  Administered 2016-06-11 – 2016-06-12 (×4): 15 mg via INTRAVENOUS
  Filled 2016-06-10 (×4): qty 1

## 2016-06-10 NOTE — Progress Notes (Signed)
Progress Note: General Surgery Service   Subjective: Pain moderate, complains more of beeping of IVF apparatus.  Objective: Vital signs in last 24 hours: Temp:  [98.4 F (36.9 C)-98.9 F (37.2 C)] 98.4 F (36.9 C) (02/25 0612) Pulse Rate:  [73-80] 73 (02/25 0612) Resp:  [16-22] 17 (02/25 0612) BP: (111-145)/(56-71) 145/71 (02/25 0612) SpO2:  [95 %-100 %] 100 % (02/25 0612) Last BM Date: 06/09/16  Intake/Output from previous day: 02/24 0701 - 02/25 0700 In: 3478.8 [I.V.:3478.8] Out: 1226 [Urine:950; Emesis/NG output:200; Stool:76] Intake/Output this shift: No intake/output data recorded.  Lungs: CTAB  Cardiovascular: RRR  Abd: soft, ATTP, incisions with dry blood at the top, small amount of serous drainage from right incisions, otherwise clean and dry  Extremities: no edema  Neuro: AOx4  Lab Results: CBC   Recent Labs  06/09/16 0450 06/10/16 0438  WBC 8.0 4.6  HGB 7.2* 7.1*  HCT 21.8* 21.2*  PLT 212 196   BMET  Recent Labs  06/09/16 0450 06/10/16 0438  NA 138 136  K 4.1 3.6  CL 109 106  CO2 22 22  GLUCOSE 110* 96  BUN 9 10  CREATININE 0.89 0.74  CALCIUM 8.4* 8.4*   PT/INR No results for input(s): LABPROT, INR in the last 72 hours. ABG No results for input(s): PHART, HCO3 in the last 72 hours.  Invalid input(s): PCO2, PO2  Studies/Results:  Anti-infectives: Anti-infectives    Start     Dose/Rate Route Frequency Ordered Stop   06/07/16 2200  cefoTEtan (CEFOTAN) 2 g in dextrose 5 % 50 mL IVPB     2 g 100 mL/hr over 30 Minutes Intravenous Every 12 hours 06/07/16 1433 06/07/16 2233   06/07/16 0739  cefoTEtan (CEFOTAN) 2 g in dextrose 5 % 50 mL IVPB     2 g 100 mL/hr over 30 Minutes Intravenous On call to O.R. 06/07/16 0739 06/07/16 1009      Medications: Scheduled Meds: . Chlorhexidine Gluconate Cloth  6 each Topical Daily  . enoxaparin (LOVENOX) injection  40 mg Subcutaneous Q24H  . ketorolac  15 mg Intravenous Q6H  . morphine    Intravenous Q4H  . mupirocin ointment  1 application Nasal BID  . sodium chloride flush  3 mL Intravenous Q12H   Continuous Infusions: . dextrose 5 % and 0.9 % NaCl with KCl 20 mEq/L 75 mL/hr at 06/10/16 0427   PRN Meds:.[DISCONTINUED] acetaminophen **OR** acetaminophen, diphenhydrAMINE, diphenhydrAMINE **OR** diphenhydrAMINE, diphenhydrAMINE, naloxone, naloxone **AND** sodium chloride flush, ondansetron **OR** ondansetron (ZOFRAN) IV, ondansetron (ZOFRAN) IV, ondansetron (ZOFRAN) IV, oxyCODONE, sodium chloride flush  Assessment/Plan: Patient Active Problem List   Diagnosis Date Noted  . Malnutrition of moderate degree 06/07/2016  . Hyponatremia 06/06/2016  . Abnormal CT of the abdomen 06/06/2016  . Hypokalemia 06/05/2016  . Ischemic bowel disease ([place])   . Bowel perforation ([place])   . Abdominal abscess ([place])   . Pleural effusion   . Acute kidney injury ([place])   . Sepsis due to Escherichia coli ([place])   . Sepsis ([place])   . Abdominal infection ([place]) 02/08/2016  . Peritonitis ([place])   . Small bowel perforation ([place]) 01/27/2016   s/p Procedure(s): ILEOSTOMY TAKEDOWN 06/07/2016. Having several liquid BMs currently. -advance to pureed diet (adentulous) -decrease fluids to KVO (given PCA) -ambulate in hallway -add PO oxy -hopefully can remove PCA tomorrow and continue to transition toward outpatient care    LOS: 4 days   Mickeal Skinner, MD Pg# [phone] [facility]

## 2016-06-10 NOTE — Progress Notes (Signed)
PROGRESS NOTE    [name]  [id number] DOB: October 02, 1965 DOA: 06/05/2016 PCP: No primary care provider on file.   Chief Complaint  Patient presents with  . Abnormal Lab    Brief Narrative:  HPI on 06/05/2016 by Dr. Lily Kocher [name] is a 51 y.o. 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  He also has a history of chronic pain with narcotic dependence and recurrent electrolyte abnormalities.  He reported for outpatient labs yesterday as part of his pre-op evaluation for planned ostomy Reversal.  He was found to have low sodium and potassium levels.  He was told to come to the ED for management.  Of note, he is supposed to be on an oral sodium/potassium supplement at home.  While in the ED he reported new pain in his lower abdomen that started in the past 1-2 days after sneezing.  He now has a palpable mass/area of herniation to the right of his midline abdominal incision.  No fevers.  He has had chills but no sweats.  He has intermittent light-headedness (which is not new); no LOC.  No chest pain or shortness of breath.  No nausea or vomiting.  Appetite has been stable. Assessment & Plan   Hypovolemic hyponatremia, hypokalemia secondary to excessive GI losses -Resolved -Patient had ileostomy  -electrolytes within normal limits -Continue to monitor BMP  Abnormal findings on CT -General Surgery consulted and appreciated -Reversal yesterday -No signs of sepsis at this point -Lactic acid normal  Abnormal liver lesions -Needs MRI of liver at some point.   History of small bowel resection, ostomy -S/p reversal/ ileostomy takedown  06/07/2016 -General surgery consulted and appreciated -Transitioned to morphine PCA, will likely be weaned off  within 24 hours, started on PO oxy -NG tube discontinued 2/24 -tolerated clears, diet advanced to dypshagia I  Chronic anemia -Hemoglobin currently 7.1 (baseline 8-9) -Continue iron supplement when able  -monitor CBC  Chronic pain -continue pain control PRN- as above  Anxiety  Moderate malnutrition -Nutrition consulted, continue supplements when able   DVT Prophylaxis  SCDs  Code Status: Full  Family Communication:  None at bedside  Disposition Plan: Admitted.   Consultants General surgery  Procedures  Reversal of ileostomy  Antibiotics   Anti-infectives    Start     Dose/Rate Route Frequency Ordered Stop   06/07/16 2200  cefoTEtan (CEFOTAN) 2 g in dextrose 5 % 50 mL IVPB     2 g 100 mL/hr over 30 Minutes Intravenous Every 12 hours 06/07/16 1433 06/07/16 2233   06/07/16 0739  cefoTEtan (CEFOTAN) 2 g in dextrose 5 % 50 mL IVPB     2 g 100 mL/hr over 30 Minutes Intravenous On call to O.R. 06/07/16 0739 06/07/16 1009      Subjective:   Moshe Salisbury seen and examined today.  Patient feels pain is controlled, but upset that PCA pump will be discontinued soon. Endorses bowel movement. Denies nausea, vomiting, chest pain, shortness of breath, dizziness, headache. Wants his dressings changed more than once per day.   Objective:   Vitals:   06/10/16 0041 06/10/16 0421 06/10/16 0612 06/10/16 1035  BP:   (!) 145/71   Pulse:   73   Resp: 18 18 17 16   Temp:   98.4 F (36.9 C)   TempSrc:  Oral   SpO2: 98% 97% 100% 99%  Weight:      Height:        Intake/Output Summary (Last 24 hours) at 06/10/16 1138 Last data filed at 06/09/16 1817  Gross per 24 hour  Intake          3478.75 ml  Output              576 ml  Net          2902.75 ml   Filed Weights   06/06/16 0107  Weight: 69.3 kg (152 lb 12.8 oz)    Exam  General: Well developed, well nourished, NAD, appears stated age  64: NCAT, mucous membranes moist  Cardiovascular: S1 S2 auscultated, RRR, no  murmurs  Respiratory: Clear to auscultation bilaterally with equal chest rise  Abdomen: Soft, TTP, nondistended, dressing in place, +BS  Extremities: warm dry without cyanosis clubbing or edema  Neuro: AAOx3, nonfocal  Psych: Normal affect and demeanor, pleasant    Data Reviewed: I have personally reviewed following labs and imaging studies  CBC:  Recent Labs Lab 06/04/16 1402 06/05/16 1621 06/07/16 0555 06/08/16 0552 06/09/16 0450 06/10/16 0438  WBC 8.6 8.0 6.7 9.7 8.0 4.6  NEUTROABS 4.8  --   --   --   --   --   HGB 8.6* 9.4* 8.5* 8.7* 7.2* 7.1*  HCT 23.9* 26.2* 24.6* 25.3* 21.8* 21.2*  MCV 82.4 82.9 85.4 86.1 88.3 88.7  PLT 204 222 208 212 212 [id number]   Basic Metabolic Panel:  Recent Labs Lab 06/06/16 0211 06/07/16 0555 06/08/16 0552 06/09/16 0450 06/10/16 0438  NA 128* 129* 134* 138 136  K 3.2* 4.1 4.4 4.1 3.6  CL 89* 94* 103 109 106  CO2 29 23 23 22 22   GLUCOSE 105* 98 118* 110* 96  BUN <5* 6 8 9 10   CREATININE 0.85 0.94 1.07 0.89 0.74  CALCIUM 9.5 9.5 8.6* 8.4* 8.4*  MG 1.8 1.6* 1.8  --   --    GFR: Estimated Creatinine Clearance: 108.3 mL/min (by C-G formula based on SCr of 0.74 mg/dL). Liver Function Tests:  Recent Labs Lab 06/05/16 1621  AST 32  ALT 21  ALKPHOS 99  BILITOT 0.4  PROT 7.1  ALBUMIN 4.0   No results for input(s): LIPASE, AMYLASE in the last 168 hours. No results for input(s): AMMONIA in the last 168 hours. Coagulation Profile: No results for input(s): INR, PROTIME in the last 168 hours. Cardiac Enzymes: No results for input(s): CKTOTAL, CKMB, CKMBINDEX, TROPONINI in the last 168 hours. BNP (last 3 results) No results for input(s): PROBNP in the last 8760 hours. HbA1C: No results for input(s): HGBA1C in the last 72 hours. CBG: No results for input(s): GLUCAP in the last 168 hours. Lipid Profile: No results for input(s): CHOL, HDL, LDLCALC, TRIG, CHOLHDL, LDLDIRECT in the last 72 hours. Thyroid Function Tests: No results  for input(s): TSH, T4TOTAL, FREET4, T3FREE, THYROIDAB in the last 72 hours. Anemia Panel: No results for input(s): VITAMINB12, FOLATE, FERRITIN, TIBC, IRON, RETICCTPCT in the last 72 hours. Urine analysis:    Component Value Date/Time   COLORURINE COLORLESS (A) 06/05/2016 2122   APPEARANCEUR CLEAR 06/05/2016 2122   LABSPEC 1.000 (L) 06/05/2016 2122   PHURINE 7.0 06/05/2016 2122   GLUCOSEU NEGATIVE 06/05/2016 2122   HGBUR NEGATIVE 06/05/2016 2122   BILIRUBINUR NEGATIVE 06/05/2016 2122   KETONESUR NEGATIVE 06/05/2016 2122   PROTEINUR NEGATIVE 06/05/2016 2122   UROBILINOGEN 1.0 07/28/2007 1539  NITRITE NEGATIVE 06/05/2016 2122   LEUKOCYTESUR NEGATIVE 06/05/2016 2122   Sepsis Labs: @LABRCNTIP (procalcitonin:4,lacticidven:4)  ) Recent Results (from the past 240 hour(s))  Urine culture     Status: None   Collection Time: 06/05/16  9:22 PM  Result Value Ref Range Status   Specimen Description URINE, CLEAN CATCH  Final   Special Requests NONE  Final   Culture NO GROWTH  Final   Report Status 06/07/2016 FINAL  Final  Surgical pcr screen     Status: Abnormal   Collection Time: 06/07/16 12:12 AM  Result Value Ref Range Status   MRSA, PCR NEGATIVE NEGATIVE Final   Staphylococcus aureus POSITIVE (A) NEGATIVE Final    Comment:        The Xpert SA Assay (FDA approved for NASAL specimens in patients over 73 years of age), is one component of a comprehensive surveillance program.  Test performance has been validated by [facility] for patients greater than or equal to 80 year old. It is not intended to diagnose infection nor to guide or monitor treatment.       Radiology Studies: No results found.   Scheduled Meds: . Chlorhexidine Gluconate Cloth  6 each Topical Daily  . enoxaparin (LOVENOX) injection  40 mg Subcutaneous Q24H  . ketorolac  15 mg Intravenous Q6H  . morphine   Intravenous Q4H  . mupirocin ointment  1 application Nasal BID  . sodium chloride flush  3 mL  Intravenous Q12H   Continuous Infusions: . dextrose 5 % and 0.9 % NaCl with KCl 20 mEq/L 10 mL/hr at 06/10/16 1034     LOS: 4 days   Time Spent in minutes   30 minutes  Akira Perusse D.O. on 06/10/2016 at 11:38 AM  Between 7am to 7pm - Pager - [phone]  After 7pm go to [email or web address] - password [email or web address]  And look for the night coverage person covering for me after hours  [facility] Office  [phone]

## 2016-06-11 LAB — CBC
HCT: 19.9 % — ABNORMAL LOW (ref 39.0–52.0)
HEMATOCRIT: 23.6 % — AB (ref 39.0–52.0)
HEMOGLOBIN: 8.1 g/dL — AB (ref 13.0–17.0)
Hemoglobin: 6.7 g/dL — CL (ref 13.0–17.0)
MCH: 29.3 pg (ref 26.0–34.0)
MCH: 29.1 pg (ref 26.0–34.0)
MCHC: 33.7 g/dL (ref 30.0–36.0)
MCHC: 34.3 g/dL (ref 30.0–36.0)
MCV: 86.9 fL (ref 78.0–100.0)
MCV: 84.9 fL (ref 78.0–100.0)
PLATELETS: 218 10*3/uL (ref 150–400)
Platelets: 234 10*3/uL (ref 150–400)
RBC: 2.29 MIL/uL — AB (ref 4.22–5.81)
RBC: 2.78 MIL/uL — ABNORMAL LOW (ref 4.22–5.81)
RDW: 13.9 % (ref 11.5–15.5)
RDW: 13.7 % (ref 11.5–15.5)
WBC: 4.4 10*3/uL (ref 4.0–10.5)
WBC: 6.7 10*3/uL (ref 4.0–10.5)

## 2016-06-11 LAB — PREPARE RBC (CROSSMATCH)

## 2016-06-11 LAB — BASIC METABOLIC PANEL
ANION GAP: 11 (ref 5–15)
BUN: 11 mg/dL (ref 6–20)
CALCIUM: 8.7 mg/dL — AB (ref 8.9–10.3)
CO2: 21 mmol/L — ABNORMAL LOW (ref 22–32)
Chloride: 106 mmol/L (ref 101–111)
Creatinine, Ser: 0.75 mg/dL (ref 0.61–1.24)
Glucose, Bld: 85 mg/dL (ref 65–99)
POTASSIUM: 3.4 mmol/L — AB (ref 3.5–5.1)
SODIUM: 138 mmol/L (ref 135–145)

## 2016-06-11 LAB — MAGNESIUM: MAGNESIUM: 1.2 mg/dL — AB (ref 1.7–2.4)

## 2016-06-11 MED ORDER — FERROUS SULFATE 325 (65 FE) MG PO TABS
325.0000 mg | ORAL_TABLET | Freq: Three times a day (TID) | ORAL | Status: DC
  Administered 2016-06-11 – 2016-06-12 (×4): 325 mg via ORAL
  Filled 2016-06-11 (×4): qty 1

## 2016-06-11 MED ORDER — SODIUM CHLORIDE 0.9 % IV SOLN
Freq: Once | INTRAVENOUS | Status: AC
  Administered 2016-06-11: 12:00:00 via INTRAVENOUS

## 2016-06-11 MED ORDER — MORPHINE SULFATE (PF) 2 MG/ML IV SOLN: 2.0000 mg | INTRAVENOUS | Status: DC | PRN

## 2016-06-11 MED ORDER — POTASSIUM CHLORIDE CRYS ER 20 MEQ PO TBCR
40.0000 meq | EXTENDED_RELEASE_TABLET | Freq: Once | ORAL | Status: AC
  Administered 2016-06-11: 40 meq via ORAL
  Filled 2016-06-11: qty 2

## 2016-06-11 MED ORDER — MORPHINE SULFATE ER 100 MG PO TBCR
100.0000 mg | EXTENDED_RELEASE_TABLET | Freq: Two times a day (BID) | ORAL | Status: DC
  Administered 2016-06-11 – 2016-06-12 (×3): 100 mg via ORAL
  Filled 2016-06-11 (×3): qty 1

## 2016-06-11 MED ORDER — ADULT MULTIVITAMIN W/MINERALS CH
1.0000 | ORAL_TABLET | Freq: Every day | ORAL | Status: DC
  Administered 2016-06-11 – 2016-06-12 (×2): 1 via ORAL
  Filled 2016-06-11 (×2): qty 1

## 2016-06-11 MED ORDER — SODIUM CHLORIDE 0.9 % IV SOLN: Freq: Once | INTRAVENOUS | Status: DC

## 2016-06-11 MED ORDER — LOPERAMIDE HCL 2 MG PO CAPS
4.0000 mg | ORAL_CAPSULE | Freq: Four times a day (QID) | ORAL | Status: DC
  Administered 2016-06-11 – 2016-06-12 (×6): 4 mg via ORAL
  Filled 2016-06-11 (×6): qty 2

## 2016-06-11 MED ORDER — PSYLLIUM 95 % PO PACK
1.0000 | PACK | Freq: Four times a day (QID) | ORAL | Status: DC
  Administered 2016-06-11 – 2016-06-12 (×6): 1 via ORAL
  Filled 2016-06-11 (×8): qty 1

## 2016-06-11 MED ORDER — ALPRAZOLAM 0.25 MG PO TABS: 0.2500 mg | ORAL_TABLET | Freq: Three times a day (TID) | ORAL | Status: DC | PRN

## 2016-06-11 MED ORDER — PANTOPRAZOLE SODIUM 40 MG PO TBEC
40.0000 mg | DELAYED_RELEASE_TABLET | Freq: Every day | ORAL | Status: DC
  Administered 2016-06-11 – 2016-06-12 (×2): 40 mg via ORAL
  Filled 2016-06-11 (×2): qty 1

## 2016-06-11 NOTE — Progress Notes (Signed)
GS Progress Note Subjective: Patient sitting  Up on the toilet.  Has had multiple bowel movements.  Objective: Vital signs in last 24 hours: Temp:  [97.9 F (36.6 C)-99.1 F (37.3 C)] 99.1 F (37.3 C) (02/26 0631) Pulse Rate:  [79-82] 82 (02/26 0631) Resp:  [14-18] 17 (02/26 0631) BP: (121-136)/(66-74) 121/66 (02/26 0631) SpO2:  [97 %-100 %] 98 % (02/26 0631) Last BM Date: 06/10/16  Intake/Output from previous day: 02/25 0701 - 02/26 0700 In: 509.6 [I.V.:509.6] Out: 200 [Urine:200] Intake/Output this shift: No intake/output data recorded.  Lungs: Clear to auscultation.  Abd: Great bowel sounds.  Did not l ook at his wounds today as the patient was sitting on the bedside commode  Extremities: No changes  Neuro: Intact  Lab Results: CBC   Recent Labs  06/10/16 0438 06/11/16 0522  WBC 4.6 4.4  HGB 7.1* 6.7*  HCT 21.2* 19.9*  PLT 196 218   BMET  Recent Labs  06/10/16 0438 06/11/16 0522  NA 136 138  K 3.6 3.4*  CL 106 106  CO2 22 21*  GLUCOSE 96 85  BUN 10 11  CREATININE 0.74 0.75  CALCIUM 8.4* 8.7*   PT/INR No results for input(s): LABPROT, INR in the last 72 hours. ABG No results for input(s): PHART, HCO3 in the last 72 hours.  Invalid input(s): PCO2, PO2  Studies/Results: No results found.  Anti-infectives: Anti-infectives    Start     Dose/Rate Route Frequency Ordered Stop   06/07/16 2200  cefoTEtan (CEFOTAN) 2 g in dextrose 5 % 50 mL IVPB     2 g 100 mL/hr over 30 Minutes Intravenous Every 12 hours 06/07/16 1433 06/07/16 2233   06/07/16 0739  cefoTEtan (CEFOTAN) 2 g in dextrose 5 % 50 mL IVPB     2 g 100 mL/hr over 30 Minutes Intravenous On call to O.R. 06/07/16 0739 06/07/16 1009      Assessment/Plan: s/p Procedure(s): ILEOSTOMY TAKEDOWN Advance diet Restart a lot of home medications.  LOS: 5 days    Kathryne Eriksson. Dahlia Bailiff, MD, FACS [phone] [facility] 06/11/2016

## 2016-06-11 NOTE — Progress Notes (Signed)
PROGRESS NOTE    [name]  [id number] DOB: Mar 26, 1966 DOA: 06/05/2016 PCP: No primary care provider on file.   Chief Complaint  Patient presents with  . Abnormal Lab    Brief Narrative:  HPI on 06/05/2016 by Dr. Lily Kocher [name] is a 51 y.o. 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  He also has a history of chronic pain with narcotic dependence and recurrent electrolyte abnormalities.  He reported for outpatient labs yesterday as part of his pre-op evaluation for planned ostomy Reversal.  He was found to have low sodium and potassium levels.  He was told to come to the ED for management.  Of note, he is supposed to be on an oral sodium/potassium supplement at home.  While in the ED he reported new pain in his lower abdomen that started in the past 1-2 days after sneezing.  He now has a palpable mass/area of herniation to the right of his midline abdominal incision.  No fevers.  He has had chills but no sweats.  He has intermittent light-headedness (which is not new); no LOC.  No chest pain or shortness of breath.  No nausea or vomiting.  Appetite has been stable. Assessment & Plan   Hypovolemic hyponatremia, hypokalemia secondary to excessive GI losses -Resolved -Patient had ileostomy  -electrolytes within normal limits -Continue to monitor BMP  Abnormal findings on CT -General Surgery consulted and appreciated -Reversal yesterday -No signs of sepsis at this point -Lactic acid normal  Abnormal liver lesions -Needs MRI of liver at some point.   History of small bowel resection, ostomy -S/p reversal/ ileostomy takedown  06/07/2016 -General surgery consulted and appreciated -Transitioned off of PCA today -Oral pain meds only -NG tube  discontinued 2/24  -tolerated clears, diet advanced to dypshagia 2 -Started on imodium today  Chronic anemia/ Anemia secondary to blood loss  -Hemoglobin currently 6.7 (baseline 8-9) -Restart iron supplementation today -1u PRBC ordered for transfusion -monitor CBC  Chronic pain -continue pain control PRN- as above  Anxiety  Moderate malnutrition -Nutrition consulted, continue supplements when able   DVT Prophylaxis  SCDs  Code Status: Full  Family Communication:  None at bedside  Disposition Plan: Admitted.   Consultants General surgery  Procedures  Reversal of ileostomy  Antibiotics   Anti-infectives    Start     Dose/Rate Route Frequency Ordered Stop   06/07/16 2200  cefoTEtan (CEFOTAN) 2 g in dextrose 5 % 50 mL IVPB     2 g 100 mL/hr over 30 Minutes Intravenous Every 12 hours 06/07/16 1433 06/07/16 2233   06/07/16 0739  cefoTEtan (CEFOTAN) 2 g in dextrose 5 % 50 mL IVPB     2 g 100 mL/hr over 30 Minutes Intravenous On call to O.R. 06/07/16 0739 06/07/16 1009      Subjective:   [name] seen and examined today.  Patient complains about several bowel movement, but feels ok.  Would like to be disconnected from tele and rest of wires. Denies nausea, vomiting, chest pain, shortness of breath, dizziness, headache.   Objective:   Vitals:   06/11/16 0425 06/11/16 0631 06/11/16 1011 06/11/16 1122  BP:  121/66  133/73  Pulse:  82  82  Resp: 15 17 18 17   Temp:  99.1 F (37.3 C)  99.1 F (  37.3 C)  TempSrc:  Oral  Oral  SpO2: 98% 98% 99% 100%  Weight:      Height:        Intake/Output Summary (Last 24 hours) at 06/11/16 1140 Last data filed at 06/10/16 1523  Gross per 24 hour  Intake            509.6 ml  Output              200 ml  Net            309.6 ml   Filed Weights   06/06/16 0107  Weight: 69.3 kg (152 lb 12.8 oz)    Exam  General: Well developed, well nourished, NAD  HEENT: NCAT, mucous membranes moist  Cardiovascular: S1 S2  auscultated, RRR, no murmurs  Respiratory: Clear to auscultation bilaterally with equal chest rise  Abdomen: Soft, TTP, nondistended, dressing in place, +BS  Extremities: warm dry without cyanosis clubbing or edema  Neuro: AAOx3, nonfocal  Psych: Normal affect and demeanor, pleasant    Data Reviewed: I have personally reviewed following labs and imaging studies  CBC:  Recent Labs Lab 06/04/16 1402  06/07/16 0555 06/08/16 0552 06/09/16 0450 06/10/16 0438 06/11/16 0522  WBC 8.6  < > 6.7 9.7 8.0 4.6 4.4  NEUTROABS 4.8  --   --   --   --   --   --   HGB 8.6*  < > 8.5* 8.7* 7.2* 7.1* 6.7*  HCT 23.9*  < > 24.6* 25.3* 21.8* 21.2* 19.9*  MCV 82.4  < > 85.4 86.1 88.3 88.7 86.9  PLT 204  < > 208 212 212 196 218  < > = values in this interval not displayed. Basic Metabolic Panel:  Recent Labs Lab 06/06/16 0211 06/07/16 0555 06/08/16 0552 06/09/16 0450 06/10/16 0438 06/11/16 0522  NA 128* 129* 134* 138 136 138  K 3.2* 4.1 4.4 4.1 3.6 3.4*  CL 89* 94* 103 109 106 106  CO2 29 23 23 22 22  21*  GLUCOSE 105* 98 118* 110* 96 85  BUN <5* 6 8 9 10 11   CREATININE 0.85 0.94 1.07 0.89 0.74 0.75  CALCIUM 9.5 9.5 8.6* 8.4* 8.4* 8.7*  MG 1.8 1.6* 1.8  --   --  1.2*   GFR: Estimated Creatinine Clearance: 108.3 mL/min (by C-G formula based on SCr of 0.75 mg/dL). Liver Function Tests:  Recent Labs Lab 06/05/16 1621  AST 32  ALT 21  ALKPHOS 99  BILITOT 0.4  PROT 7.1  ALBUMIN 4.0   No results for input(s): LIPASE, AMYLASE in the last 168 hours. No results for input(s): AMMONIA in the last 168 hours. Coagulation Profile: No results for input(s): INR, PROTIME in the last 168 hours. Cardiac Enzymes: No results for input(s): CKTOTAL, CKMB, CKMBINDEX, TROPONINI in the last 168 hours. BNP (last 3 results) No results for input(s): PROBNP in the last 8760 hours. HbA1C: No results for input(s): HGBA1C in the last 72 hours. CBG: No results for input(s): GLUCAP in the last 168  hours. Lipid Profile: No results for input(s): CHOL, HDL, LDLCALC, TRIG, CHOLHDL, LDLDIRECT in the last 72 hours. Thyroid Function Tests: No results for input(s): TSH, T4TOTAL, FREET4, T3FREE, THYROIDAB in the last 72 hours. Anemia Panel: No results for input(s): VITAMINB12, FOLATE, FERRITIN, TIBC, IRON, RETICCTPCT in the last 72 hours. Urine analysis:    Component Value Date/Time   COLORURINE COLORLESS (A) 06/05/2016 2122   APPEARANCEUR CLEAR 06/05/2016 2122   LABSPEC 1.000 (L) 06/05/2016  2122   PHURINE 7.0 06/05/2016 2122   GLUCOSEU NEGATIVE 06/05/2016 2122   HGBUR NEGATIVE 06/05/2016 2122   BILIRUBINUR NEGATIVE 06/05/2016 2122   KETONESUR NEGATIVE 06/05/2016 2122   PROTEINUR NEGATIVE 06/05/2016 2122   UROBILINOGEN 1.0 07/28/2007 1539   NITRITE NEGATIVE 06/05/2016 2122   LEUKOCYTESUR NEGATIVE 06/05/2016 2122   Sepsis Labs: @LABRCNTIP (procalcitonin:4,lacticidven:4)  ) Recent Results (from the past 240 hour(s))  Urine culture     Status: None   Collection Time: 06/05/16  9:22 PM  Result Value Ref Range Status   Specimen Description URINE, CLEAN CATCH  Final   Special Requests NONE  Final   Culture NO GROWTH  Final   Report Status 06/07/2016 FINAL  Final  Surgical pcr screen     Status: Abnormal   Collection Time: 06/07/16 12:12 AM  Result Value Ref Range Status   MRSA, PCR NEGATIVE NEGATIVE Final   Staphylococcus aureus POSITIVE (A) NEGATIVE Final    Comment:        The Xpert SA Assay (FDA approved for NASAL specimens in patients over 73 years of age), is one component of a comprehensive surveillance program.  Test performance has been validated by [facility] for patients greater than or equal to 26 year old. It is not intended to diagnose infection nor to guide or monitor treatment.       Radiology Studies: No results found.   Scheduled Meds: . sodium chloride   Intravenous Once  . sodium chloride   Intravenous Once  . Chlorhexidine Gluconate Cloth  6  each Topical Daily  . enoxaparin (LOVENOX) injection  40 mg Subcutaneous Q24H  . ferrous sulfate  325 mg Oral TID WC  . ketorolac  15 mg Intravenous Q6H  . loperamide  4 mg Oral QID  . morphine  100 mg Oral Q12H  . multivitamin with minerals  1 tablet Oral Daily  . mupirocin ointment  1 application Nasal BID  . pantoprazole  40 mg Oral Daily  . psyllium  1 packet Oral QID  . sodium chloride flush  3 mL Intravenous Q12H   Continuous Infusions: . dextrose 5 % and 0.9 % NaCl with KCl 20 mEq/L 10 mL/hr at 06/10/16 1034     LOS: 5 days   Time Spent in minutes   30 minutes  Boyd Buffalo D.O. on 06/11/2016 at 11:40 AM  Between 7am to 7pm - Pager - [phone]  After 7pm go to [email or web address] - password [email or web address]  And look for the night coverage person covering for me after hours  [facility] Office  [phone]

## 2016-06-11 NOTE — Anesthesia Postprocedure Evaluation (Signed)
Anesthesia Post Note  Patient: [name]  Procedure(s) Performed: Procedure(s) (LRB): ILEOSTOMY TAKEDOWN (N/A)  Patient location during evaluation: PACU Anesthesia Type: General Level of consciousness: awake and alert and patient cooperative Pain management: pain level controlled Vital Signs Assessment: post-procedure vital signs reviewed and stable Respiratory status: spontaneous breathing and respiratory function stable Cardiovascular status: stable Anesthetic complications: no       Last Vitals:  Vitals:   06/11/16 0425 06/11/16 0631  BP:  121/66  Pulse:  82  Resp: 15 17  Temp:  37.3 C    Last Pain:  Vitals:   06/11/16 0631  TempSrc: Oral  PainSc:                  [place]

## 2016-06-11 NOTE — Progress Notes (Signed)
CRITICAL VALUE ALERT  Critical value received:  Hemoglobin 6.7  Date of notification:  06/11/2016  Time of notification: 0655      Critical value read back:Yes.    Nurse who received alert:  Q. Marry Kusch  MD notified (1st page):  K. Schorr   Time of first page: [phone]  MD notified (2nd page): Dr.  Ree Kida   Time of second page: [phone]  Responding MD:    Time MD responded:

## 2016-06-12 LAB — IRON AND TIBC
IRON: 24 ug/dL — AB (ref 45–182)
SATURATION RATIOS: 11 % — AB (ref 17.9–39.5)
TIBC: 223 ug/dL — ABNORMAL LOW (ref 250–450)
UIBC: 199 ug/dL

## 2016-06-12 LAB — CBC
HCT: 25 % — ABNORMAL LOW (ref 39.0–52.0)
HEMATOCRIT: 19.9 % — AB (ref 39.0–52.0)
HEMOGLOBIN: 8.4 g/dL — AB (ref 13.0–17.0)
Hemoglobin: 6.7 g/dL — CL (ref 13.0–17.0)
MCH: 29 pg (ref 26.0–34.0)
MCH: 28.6 pg (ref 26.0–34.0)
MCHC: 33.7 g/dL (ref 30.0–36.0)
MCHC: 33.6 g/dL (ref 30.0–36.0)
MCV: 86.1 fL (ref 78.0–100.0)
MCV: 85 fL (ref 78.0–100.0)
Platelets: 208 10*3/uL (ref 150–400)
Platelets: 226 10*3/uL (ref 150–400)
RBC: 2.31 MIL/uL — ABNORMAL LOW (ref 4.22–5.81)
RBC: 2.94 MIL/uL — AB (ref 4.22–5.81)
RDW: 14.5 % (ref 11.5–15.5)
RDW: 14.1 % (ref 11.5–15.5)
WBC: 4.6 10*3/uL (ref 4.0–10.5)
WBC: 6 10*3/uL (ref 4.0–10.5)

## 2016-06-12 LAB — BASIC METABOLIC PANEL
Anion gap: 8 (ref 5–15)
BUN: 7 mg/dL (ref 6–20)
CALCIUM: 8.6 mg/dL — AB (ref 8.9–10.3)
CO2: 22 mmol/L (ref 22–32)
CREATININE: 0.78 mg/dL (ref 0.61–1.24)
Chloride: 106 mmol/L (ref 101–111)
GFR calc Af Amer: >60 mL/min (ref >60)
GFR calc non Af Amer: >60 mL/min (ref >60)
GLUCOSE: 93 mg/dL (ref 65–99)
Potassium: 3.3 mmol/L — ABNORMAL LOW (ref 3.5–5.1)
Sodium: 136 mmol/L (ref 135–145)

## 2016-06-12 LAB — FERRITIN: Ferritin: 459 ng/mL — ABNORMAL HIGH (ref 24–336)

## 2016-06-12 LAB — RETICULOCYTES
RBC.: 2.44 MIL/uL — AB (ref 4.22–5.81)
Retic Count, Absolute: 34.2 10*3/uL (ref 19.0–186.0)
Retic Ct Pct: 1.4 % (ref 0.4–3.1)

## 2016-06-12 LAB — HEMOGLOBIN AND HEMATOCRIT, BLOOD
HCT: 20.9 % — ABNORMAL LOW (ref 39.0–52.0)
Hemoglobin: 7.3 g/dL — ABNORMAL LOW (ref 13.0–17.0)

## 2016-06-12 LAB — FOLATE: FOLATE: 30 ng/mL (ref >5.9)

## 2016-06-12 LAB — PREPARE RBC (CROSSMATCH)

## 2016-06-12 LAB — OCCULT BLOOD X 1 CARD TO LAB, STOOL: Fecal Occult Bld: POSITIVE — AB

## 2016-06-12 LAB — VITAMIN B12: Vitamin B-12: 148 pg/mL — ABNORMAL LOW (ref 180–914)

## 2016-06-12 MED ORDER — SODIUM CHLORIDE 0.9 % IV SOLN
510.0000 mg | Freq: Once | INTRAVENOUS | Status: AC
  Administered 2016-06-12: 510 mg via INTRAVENOUS
  Filled 2016-06-12: qty 17

## 2016-06-12 MED ORDER — LOPERAMIDE HCL 2 MG PO CAPS
4.0000 mg | ORAL_CAPSULE | Freq: Four times a day (QID) | ORAL | 0 refills | Status: DC
Start: 2016-06-12 — End: 2021-04-03

## 2016-06-12 MED ORDER — OXYCODONE HCL 5 MG PO TABS: 5.0000 mg | ORAL_TABLET | Freq: Four times a day (QID) | ORAL | 0 refills | Status: DC | PRN

## 2016-06-12 MED ORDER — SODIUM CHLORIDE 0.9 % IV SOLN
Freq: Once | INTRAVENOUS | Status: AC
  Administered 2016-06-12: 10:00:00 via INTRAVENOUS

## 2016-06-12 MED ORDER — POTASSIUM CHLORIDE CRYS ER 20 MEQ PO TBCR
40.0000 meq | EXTENDED_RELEASE_TABLET | Freq: Once | ORAL | Status: AC
  Administered 2016-06-12: 40 meq via ORAL
  Filled 2016-06-12: qty 2

## 2016-06-12 NOTE — Progress Notes (Signed)
CRITICAL VALUE ALERT  Critical value received:  Hgb. 6.7 Date of notification: 06/12/16  Time of notification:  0658 Critical value read back:Yes.    Nurse who received alert:  Delories Heinz. Quentin Cornwall, RN  MD notified (1st page):  Schorr,NP Time of first page: [phone] MD notified (2nd page):Dr. Ree Kida  Time of second OI:[phone]  Responding MD:  Dr. Soyla Dryer on unit- [place] Time MD responded:  [phone]

## 2016-06-12 NOTE — Progress Notes (Signed)
GS Progress Note Subjective: Patient having bowel movements and looks much better.    Objective: Vital signs in last 24 hours: Temp:  [97.6 F (36.4 C)-99.1 F (37.3 C)] 97.6 F (36.4 C) (02/27 0529) Pulse Rate:  [61-82] 61 (02/27 0529) Resp:  [17-18] 18 (02/27 0529) BP: (108-133)/(55-73) 108/55 (02/27 0529) SpO2:  [99 %-100 %] 100 % (02/27 0529) Last BM Date: 06/11/16  Intake/Output from previous day: 02/26 0701 - 02/27 0700 In: 470 [P.O.:120; Blood:350] Out: -  Intake/Output this shift: No intake/output data recorded.  Lungs: Clear  Abd: Soft, great bowel sounds.  Extremities: No clinical signs or symptoms of DVT  Neuro: Intact  Lab Results: CBC   Recent Labs  06/11/16 2026 06/12/16 0544  WBC 6.7 4.6  HGB 8.1* 6.7*  HCT 23.6* 19.9*  PLT 234 208   BMET  Recent Labs  06/11/16 0522 06/12/16 0544  NA 138 136  K 3.4* 3.3*  CL 106 106  CO2 21* 22  GLUCOSE 85 93  BUN 11 7  CREATININE 0.75 0.78  CALCIUM 8.7* 8.6*   PT/INR No results for input(s): LABPROT, INR in the last 72 hours. ABG No results for input(s): PHART, HCO3 in the last 72 hours.  Invalid input(s): PCO2, PO2  Studies/Results: No results found.  Anti-infectives: Anti-infectives    Start     Dose/Rate Route Frequency Ordered Stop   06/07/16 2200  cefoTEtan (CEFOTAN) 2 g in dextrose 5 % 50 mL IVPB     2 g 100 mL/hr over 30 Minutes Intravenous Every 12 hours 06/07/16 1433 06/07/16 2233   06/07/16 0739  cefoTEtan (CEFOTAN) 2 g in dextrose 5 % 50 mL IVPB     2 g 100 mL/hr over 30 Minutes Intravenous On call to O.R. 06/07/16 0739 06/07/16 1009      Assessment/Plan: s/p Procedure(s): ILEOSTOMY TAKEDOWN Discharge Hemoglobin dropped again without knwn blood per rectum.  Will get one unit of blood prior to discharge  Will need to monitor hemoglobin as outpatient  LOS: 6 days    Kathryne Eriksson. Dahlia Bailiff, MD, FACS [phone] [facility]  Surgery 06/12/2016

## 2016-06-12 NOTE — Progress Notes (Signed)
CM faxed clincals (2/24- 06/12/2016) to Bo Merino, Workmen's Compensation liaison, fax # [phone]. Whitman Hero RN,BSN,CM

## 2016-06-12 NOTE — Discharge Summary (Signed)
Physician Discharge Summary  [name] [id number] DOB: [birth year] DOA: 06/05/2016  PCP: No primary care provider on file.  Admit date: 06/05/2016 Discharge date: 06/12/2016  Time spent: 45 minutes  Recommendations for Outpatient Follow-up:  Patient will be discharged to home.  Patient will need to follow up with primary care provider within one week of discharge, repeat CBC and BMP.  Follow up with Dr. Hulen Skains, surgery, on 06/19/2016.  Follow up with Dr. Alvy Bimler, hematologist- office will contact you with an appointment. Patient should continue medications as prescribed.  Patient should follow a regular diet, high in fiber.  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 Lily Kocher Peter Garter a 51 y.o.gentleman with a history of small bowel perforation S/P ileocecectomy on October 2017 who required subsequent admission for septic shock secondary to ischemic bowel (with perforation at his anastomosis site causing intra-abdominal abscesses). 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 He also has a history of chronic pain with narcotic dependence and recurrent electrolyte abnormalities. He reported for outpatient labs yesterday as part of his pre-op evaluation for planned ostomy Reversal. He was found to have low sodium and potassium levels. He was told to come to the ED for management. Of note, he is supposed to be on an oral sodium/potassium supplement at home.  While in the ED he reported new pain in his lower abdomen that started in the past 1-2 days after sneezing. He now has a  palpable mass/area of herniation to the right of his midline abdominal incision. No fevers. He has had chills but no sweats. He has intermittent light-headedness (which is not new); no LOC. No chest pain or shortness of breath. No nausea or vomiting. Appetite has been stable.  Hospital Course:  Hypovolemic hyponatremia, hypokalemia secondary to excessive GI losses -Resolved -Patient had ileostomy  -electrolytes within normal limits -Continue to monitor BMP  Abnormal findings on CT -General Surgery consulted and appreciated -Reversal yesterday -No signs of sepsis at this point -Lactic acid normal  Abnormal liver lesions -Needs MRI of liver at some point.   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  -After reviewing patient's history, hemoglobin was normal in 2008-9, around 13  Chronic pain -continue pain control PRN- as above  Anxiety -Continue xanax  Moderate malnutrition -Nutrition consulted, continue supplements when able   Consultants General surgery  Procedures  Reversal of ileostomy  Discharge Exam: Vitals:   06/12/16 1247 06/12/16 1400  BP: 119/74 109/60  Pulse: 73 79  Resp:  18  Temp: 98.5 F (36.9 C) 98.4 F (36.9  C)   Feeling better today. Feels bowel movements are slowing down. Denies nausea, vomiting, chest pain, shortness of breath, dizziness, headache.   Exam  General: Well developed, well nourished, NAD  HEENT: NCAT, mucous membranes moist  Cardiovascular: S1 S2 auscultated, RRR, no murmurs  Respiratory: Clear to auscultation bilaterally with equal chest rise  Abdomen: Soft, mild TTP, nondistended, dressing in place, +BS  Extremities: warm dry without cyanosis clubbing or edema  Neuro: AAOx3, nonfocal  Psych: Normal affect and demeanor, pleasant   Discharge Instructions Discharge Instructions    Discharge instructions    Complete by:  As directed    Patient will be discharged to home.  Patient will need to follow up with primary care provider within one week of discharge, repeat CBC and BMP.  Follow up with Dr. Hulen Skains, surgery, on 06/19/2016.  Follow up with Dr. Alvy Bimler, hematologist- office will contact you with an appointment. Patient should continue medications as prescribed.  Patient should follow a regular diet, high in fiber.     Current Discharge Medication List    START taking these medications   Details  oxyCODONE (OXY IR/ROXICODONE) 5 MG immediate release tablet Take 1-2 tablets (5-10 mg total) by mouth every 6 (six) hours as needed for moderate pain. Qty: 40 tablet, Refills: 0      CONTINUE these medications which have CHANGED   Details  loperamide (IMODIUM) 2 MG capsule Take 2 capsules (4 mg total) by mouth 4 (four) times daily. Qty: 30 capsule, Refills: 0      CONTINUE these medications which have NOT CHANGED   Details  acetaminophen (TYLENOL) 325 MG tablet Take 325-650 mg by mouth every 6 (six) hours as needed for headache.     albuterol (PROVENTIL HFA;VENTOLIN HFA) 108 (90 Base) MCG/ACT inhaler Inhale 2 puffs into the lungs every 6 (six) hours as needed for wheezing or shortness of breath.    ALPRAZolam (XANAX) 0.5 MG tablet Take 0.25-0.5 mg by mouth 3  (three) times daily as needed for anxiety or sleep.     diphenoxylate-atropine (LOMOTIL) 2.5-0.025 MG tablet Take 2 tablets by mouth 4 (four) times daily. Qty: 240 tablet, Refills: 0    ferrous sulfate 325 (65 FE) MG tablet Take 1 tablet (325 mg total) by mouth 3 (three) times daily with meals. Qty: 90 tablet, Refills: 0    furosemide (LASIX) 40 MG tablet Take 40 mg by mouth daily as needed for fluid or edema.     Multiple Vitamin (MULTIVITAMIN WITH MINERALS) TABS tablet Take 1 tablet by mouth daily.    oxymorphone (OPANA ER) 40 MG 12 hr tablet Take 40 mg by mouth 3 (three) times daily.    paregoric 2 MG/5ML solution Take 5 mLs by mouth as needed for diarrhea or loose stools. Qty: 120 mL, Refills: 0    potassium & sodium phosphates (PHOS-NAK) 280-160-250 MG PACK Take 1 packet by mouth 4 (four) times daily. Qty: 120 packet, Refills: 0    psyllium (HYDROCIL/METAMUCIL) 95 % PACK Take 2 packets by mouth 3 (three) times daily. Qty: 180 each, Refills: 0    vitamin C (ASCORBIC ACID) 500 MG tablet Take 500 mg by mouth daily.    pantoprazole (PROTONIX) 40 MG tablet Take 1 tablet (40 mg total) by mouth daily. Qty: 30 tablet, Refills: 0       Allergies  Allergen Reactions  . Celecoxib Other (See Comments)    Other reaction(s): IRRITABILITY  .  Clonazepam Itching  . Cyclobenzaprine Other (See Comments)    Other reaction(s): IRRITABILITY  . Dexamethasone Other (See Comments)    Other reaction(s): IRRITABILITY  . Diazepam Other (See Comments)    Other reaction(s): IRRITABILITY  . Etodolac Other (See Comments)    Other reaction(s): IRRITABILITY  . Gabapentin Other (See Comments)    Other reaction(s): IRRITABILITY  . Metaxalone Other (See Comments)    Other reaction(s): IRRITABILITY  . Methocarbamol Other (See Comments)    Other reaction(s): IRRITABILITY  . Pregabalin Other (See Comments)    Other reaction(s): IRRITABILITY  . Propoxyphene Other (See Comments)    Other reaction(s):  IRRITABILITY  . Tramadol Other (See Comments)    Other reaction(s): IRRITABILITY  . Latex Itching and Rash  . Tape Rash and Other (See Comments)    PLASTIC TAPE TAKES OFF 1-2 LAYERS OF [name] SKIN   Follow-up Information    Judeth Horn, MD. Schedule an appointment as soon as possible for a visit on 06/19/2016.   Specialty:  General Surgery Why:  Will remove staple and evaluate for anemia and healing Contact information: [address] [place] [address] [phone]        Primary care physician. Schedule an appointment as soon as possible for a visit in 1 week(s).   Why:  Hospital follow up, repeat CBC       Heath Lark, MD. Schedule an appointment as soon as possible for a visit in 3 week(s).   Specialty:  Hematology and Oncology Why:  Office will contact you with an appointment.  Contact information: [place] [address] [phone]            The results of significant diagnostics from this hospitalization (including imaging, microbiology, ancillary and laboratory) are listed below for reference.    Significant Diagnostic Studies: Chest 2 View  Result Date: 06/04/2016 CLINICAL DATA:  Ileostomy takedown.  CHF. EXAM: CHEST  2 VIEW COMPARISON:  February 29, 2016 FINDINGS: No pneumothorax. The cardiomediastinal silhouette is normal. Persistent opacity in the left lung base continues to improve. The linear nature suggests a component of atelectasis and/or scar. No other interval changes or acute abnormalities. IMPRESSION: Continued improvement of the left lower lobe opacity. I favor at least a component of this opacity is atelectasis or scar. Electronically Signed   By: Dorise Bullion III M.D   On: 06/04/2016 18:43   Ct Abdomen Pelvis W Contrast  Result Date: 06/05/2016 CLINICAL DATA:  Follow-up incarcerated ventral hernia distal to the ostomy EXAM: CT ABDOMEN AND PELVIS WITH CONTRAST TECHNIQUE: Multidetector CT imaging of the  abdomen and pelvis was performed using the standard protocol following bolus administration of intravenous contrast. CONTRAST:  100 mL Isovue-300 intravenous COMPARISON:  02/25/2016 FINDINGS: Lower chest: Linear scarring in the left lower lobe. No acute infiltrate or effusion at the bilateral lung bases. The heart is nonenlarged. Hepatobiliary: Multiple hypodense liver lesions are again visualized with slight increase in size of several lesions 8 lateral segment left hepatic lobe lesion measures 2.2 by 2 cm compared with 2.1 x 2.1 cm previously and demonstrates patchy internal enhancement. A lesion in the posterior right hepatic lobe measures 1 cm, compared with 7 mm previously. An irregular hypodense lesion in the medial segment of the left hepatic lobe is not clearly identified on the current study. No calcified gallstones. No biliary dilatation. Pancreas: Unremarkable. No pancreatic ductal dilatation or surrounding inflammatory changes. Spleen: Normal in size without focal abnormality. Adrenals/Urinary Tract: Adrenal  glands are unremarkable. Kidneys are normal, without renal calculi, focal lesion, or hydronephrosis. Bladder is unremarkable. Stomach/Bowel: There is a right lower quadrant ostomy. Patient is status post ileocecal and distal small bowel resection. No parastomal hernia is visualized. There is no evidence for a small bowel obstruction. Collapsed blind ending large bowel with mild wall thickening as before. Vascular/Lymphatic: Aortic atherosclerosis. Stable multiple mildly enlarged upper abdominal lymph nodes. Reproductive: Prostate is unremarkable. Other: There is no free air or free fluid. Soft tissue thickening and edema/ inflammatory response around the right greater than left rectus sheath. Midline soft tissue thickening situated between the rectus muscles below the level of the umbilicus. No evidence for a soft tissue abscess. Musculoskeletal: Stable postoperative changes at L3-L4 and L4-L5.  Additional lateral surgical plate and screw fixation partially visualized at T10. IMPRESSION: 1. Status post the ileocecal and distal small bowel resection with right lower quadrant ileostomy. No parastomal hernia is visualized. There is no bowel containing ventral hernia identified. 2. Slightly enlarged appearance of the inferior rectus muscles with subcutaneous edema and focal midline soft tissue thickening along the midline infra umbilical abdominal wall, this could relate to postsurgical changes, or possibly infection/ phlegmon. There is some fluid around the right rectus muscle. There is no soft tissue abscess. 3. Multiple indeterminate low-density lesions in the liver, several of which have increased in size. Findings could be secondary to multifocal infection with neoplastic etiology not excluded. As previously recommended, nonemergent MRI follow-up may be performed when clinically feasible. Electronically Signed   By: Donavan Foil M.D.   On: 06/05/2016 22:32   Dg Abd Portable 1v  Result Date: 06/07/2016 CLINICAL DATA:  NG tube placement EXAM: PORTABLE ABDOMEN - 1 VIEW COMPARISON:  06/05/2016 FINDINGS: There is normal small bowel gas pattern. Midline abdominal wall skin staples are noted. There is NG tube coiled within stomach with tip in proximal stomach laterally. IMPRESSION: NG tube coiled itself within stomach with tip in proximal stomach laterally. Electronically Signed   By: Lahoma Crocker M.D.   On: 06/07/2016 14:06    Microbiology: Recent Results (from the past 240 hour(s))  Urine culture     Status: None   Collection Time: 06/05/16  9:22 PM  Result Value Ref Range Status   Specimen Description URINE, CLEAN CATCH  Final   Special Requests NONE  Final   Culture NO GROWTH  Final   Report Status 06/07/2016 FINAL  Final  Surgical pcr screen     Status: Abnormal   Collection Time: 06/07/16 12:12 AM  Result Value Ref Range Status   MRSA, PCR NEGATIVE NEGATIVE Final   Staphylococcus aureus  POSITIVE (A) NEGATIVE Final    Comment:        The Xpert SA Assay (FDA approved for NASAL specimens in patients over 15 years of age), is one component of a comprehensive surveillance program.  Test performance has been validated by [facility] for patients greater than or equal to 59 year old. It is not intended to diagnose infection nor to guide or monitor treatment.      Labs: Basic Metabolic Panel:  Recent Labs Lab 06/06/16 0211 06/07/16 0555 06/08/16 0552 06/09/16 0450 06/10/16 0438 06/11/16 0522 06/12/16 0544  NA 128* 129* 134* 138 136 138 136  K 3.2* 4.1 4.4 4.1 3.6 3.4* 3.3*  CL 89* 94* 103 109 106 106 106  CO2 29 23 23 22 22  21* 22  GLUCOSE 105* 98 118* 110* 96 85 93  BUN <5* 6 8  9 10 11 7   CREATININE 0.85 0.94 1.07 0.89 0.74 0.75 0.78  CALCIUM 9.5 9.5 8.6* 8.4* 8.4* 8.7* 8.6*  MG 1.8 1.6* 1.8  --   --  1.2*  --    Liver Function Tests:  Recent Labs Lab 06/05/16 1621  AST 32  ALT 21  ALKPHOS 99  BILITOT 0.4  PROT 7.1  ALBUMIN 4.0   No results for input(s): LIPASE, AMYLASE in the last 168 hours. No results for input(s): AMMONIA in the last 168 hours. CBC:  Recent Labs Lab 06/10/16 0438 06/11/16 0522 06/11/16 2026 06/12/16 0544 06/12/16 0815 06/12/16 1420  WBC 4.6 4.4 6.7 4.6  --  6.0  HGB 7.1* 6.7* 8.1* 6.7* 7.3* 8.4*  HCT 21.2* 19.9* 23.6* 19.9* 20.9* 25.0*  MCV 88.7 86.9 84.9 86.1  --  85.0  PLT 196 218 234 208  --  226   Cardiac Enzymes: No results for input(s): CKTOTAL, CKMB, CKMBINDEX, TROPONINI in the last 168 hours. BNP: BNP (last 3 results) No results for input(s): BNP in the last 8760 hours.  ProBNP (last 3 results) No results for input(s): PROBNP in the last 8760 hours.  CBG: No results for input(s): GLUCAP in the last 168 hours.     SignedCristal Ford  [facility] 06/12/2016, 3:16 PM

## 2016-06-12 NOTE — Care Management Note (Signed)
Case Management Note  Patient Details  Name: [name] MRN: [id number] Date of Birth: 1965/11/16  Subjective/Objective:                  Pt with a hx of small bowel perforation S/P ileocecectomy on October 2017 who required subsequent admission for septic shock secondary to ischemic .bowel (with perforation at his anastomosis site causing intra-abdominal abscesses). He then required ileostomy,  history of chronic pain with narcotic dependence and recurrent electrolyte abnormalities - S/P  reversal/ ileostomy takedown  06/07/2016  Action/Plan: Plan is to d/c today.  Expected Discharge Date:    06/12/2016           Expected Discharge Plan:  Home/Self Care  In-House Referral:     Discharge planning Services  CM Consult  Post Acute Care Choice:    Choice offered to:  Patient, Spouse  Status of Service:  Completed, signed off  If discussed at [place] of Stay Meetings, dates discussed:    Additional Comments:  Sharin Mons, RN 06/12/2016, 1:30 PM

## 2016-06-12 NOTE — Progress Notes (Signed)
Nsg Discharge Note  Admit Date:  06/05/2016 Discharge date: 06/12/2016   SHIRLY MORESI to be D/C'd Home per MD order.  AVS completed.  Copy for chart, and copy for patient signed, and dated. Patient/caregiver able to verbalize understanding.  Discharge Medication: Allergies as of 06/12/2016      Reactions   Celecoxib Other (See Comments)   Other reaction(s): IRRITABILITY   Clonazepam Itching   Cyclobenzaprine Other (See Comments)   Other reaction(s): IRRITABILITY   Dexamethasone Other (See Comments)   Other reaction(s): IRRITABILITY   Diazepam Other (See Comments)   Other reaction(s): IRRITABILITY   Etodolac Other (See Comments)   Other reaction(s): IRRITABILITY   Gabapentin Other (See Comments)   Other reaction(s): IRRITABILITY   Metaxalone Other (See Comments)   Other reaction(s): IRRITABILITY   Methocarbamol Other (See Comments)   Other reaction(s): IRRITABILITY   Pregabalin Other (See Comments)   Other reaction(s): IRRITABILITY   Propoxyphene Other (See Comments)   Other reaction(s): IRRITABILITY   Tramadol Other (See Comments)   Other reaction(s): IRRITABILITY   Latex Itching, Rash   Tape Rash, Other (See Comments)   PLASTIC TAPE TAKES OFF 1-2 LAYERS OF [name] SKIN      Medication List    TAKE these medications   acetaminophen 325 MG tablet Commonly known as:  TYLENOL Take 325-650 mg by mouth every 6 (six) hours as needed for headache.   albuterol 108 (90 Base) MCG/ACT inhaler Commonly known as:  PROVENTIL HFA;VENTOLIN HFA Inhale 2 puffs into the lungs every 6 (six) hours as needed for wheezing or shortness of breath.   diphenoxylate-atropine 2.5-0.025 MG tablet Commonly known as:  LOMOTIL Take 2 tablets by mouth 4 (four) times daily.   ferrous sulfate 325 (65 FE) MG tablet Take 1 tablet (325 mg total) by mouth 3 (three) times daily with meals.   furosemide 40 MG tablet Commonly known as:  LASIX Take 40 mg by mouth daily as needed for fluid or edema.    loperamide 2 MG capsule Commonly known as:  IMODIUM Take 2 capsules (4 mg total) by mouth 4 (four) times daily. What changed:  how much to take   multivitamin with minerals Tabs tablet Take 1 tablet by mouth daily.   oxyCODONE 5 MG immediate release tablet Commonly known as:  Oxy IR/ROXICODONE Take 1-2 tablets (5-10 mg total) by mouth every 6 (six) hours as needed for moderate pain.   oxymorphone 40 MG 12 hr tablet Commonly known as:  OPANA ER Take 40 mg by mouth 3 (three) times daily.   pantoprazole 40 MG tablet Commonly known as:  PROTONIX Take 1 tablet (40 mg total) by mouth daily.   paregoric 2 MG/5ML solution Take 5 mLs by mouth as needed for diarrhea or loose stools.   potassium & sodium phosphates 280-160-250 MG Pack Commonly known as:  PHOS-NAK Take 1 packet by mouth 4 (four) times daily.   psyllium 95 % Pack Commonly known as:  HYDROCIL/METAMUCIL Take 2 packets by mouth 3 (three) times daily. What changed:  how much to take  when to take this   vitamin C 500 MG tablet Commonly known as:  ASCORBIC ACID Take 500 mg by mouth daily.   XANAX 0.5 MG tablet Generic drug:  ALPRAZolam Take 0.25-0.5 mg by mouth 3 (three) times daily as needed for anxiety or sleep.       Discharge Assessment: Vitals:   06/12/16 1247 06/12/16 1400  BP: 119/74 109/60  Pulse: 73 79  Resp:  18  Temp: 98.5 F (36.9 C) 98.4 F (36.9 C)   Skin clean, dry and intact without evidence of skin break down, no evidence of skin tears noted. IV catheter discontinued intact. Site without signs and symptoms of complications - no redness or edema noted at insertion site, patient denies c/o pain - only slight tenderness at site.  Dressing with slight pressure applied.  D/c Instructions-Education: Discharge instructions given to patient/family with verbalized understanding. D/c education completed with patient/family including follow up instructions, medication list, d/c activities  limitations if indicated, with other d/c instructions as indicated by MD - patient able to verbalize understanding, all questions fully answered. Patient instructed to return to ED, call 911, or call MD for any changes in condition.  Patient escorted via [place], and D/C home via private auto.  Salley Slaughter, RN 06/12/2016 4:16 PM

## 2016-06-12 NOTE — Plan of Care (Signed)
Problem: Pain Managment: Goal: General experience of comfort will improve Outcome: Not Progressing Pt. with hx chronic pain.

## 2016-06-13 LAB — TYPE AND SCREEN
ABO/RH(D): A NEG
ANTIBODY SCREEN: NEGATIVE
UNIT DIVISION: 0
UNIT DIVISION: 0

## 2016-06-13 LAB — BPAM RBC
BLOOD PRODUCT EXPIRATION DATE: 201803012359
BLOOD PRODUCT EXPIRATION DATE: 201803132359
ISSUE DATE / TIME: 201802261126
ISSUE DATE / TIME: 201802271020
UNIT TYPE AND RH: 600
Unit Type and Rh: 9500

## 2016-06-13 NOTE — Progress Notes (Signed)
CM faxed d/c summary to [employer], [phone]. Whitman Hero RN,BSN,CM

## 2016-07-04 ENCOUNTER — Encounter: Payer: Self-pay | Admitting: Hematology and Oncology

## 2016-07-04 ENCOUNTER — Telehealth: Payer: Self-pay | Admitting: Hematology and Oncology

## 2016-07-04 ENCOUNTER — Ambulatory Visit: Payer: Self-pay | Admitting: Hematology and Oncology

## 2016-07-04 ENCOUNTER — Other Ambulatory Visit: Payer: Self-pay | Admitting: Hematology and Oncology

## 2016-07-04 ENCOUNTER — Other Ambulatory Visit: Payer: Self-pay

## 2016-07-04 DIAGNOSIS — E876 Hypokalemia: Secondary | ICD-10-CM

## 2016-07-04 DIAGNOSIS — D518 Other vitamin B12 deficiency anemias: Secondary | ICD-10-CM

## 2016-07-04 DIAGNOSIS — D519 Vitamin B12 deficiency anemia, unspecified: Secondary | ICD-10-CM | POA: Insufficient documentation

## 2016-07-04 DIAGNOSIS — D500 Iron deficiency anemia secondary to blood loss (chronic): Secondary | ICD-10-CM | POA: Insufficient documentation

## 2016-07-04 NOTE — Telephone Encounter (Signed)
Appt has been rescheduled for the pt to see Dr. Alvy Bimler on 4/4 at 10:15am. Left a vm with the appt date and time.

## 2016-07-04 NOTE — Telephone Encounter (Signed)
Pt cld to reschedule appt. Msg sent to Dr. Alvy Bimler for another appt.

## 2016-07-18 ENCOUNTER — Encounter (INDEPENDENT_AMBULATORY_CARE_PROVIDER_SITE_OTHER): Payer: Self-pay

## 2016-07-18 ENCOUNTER — Encounter: Payer: Self-pay | Admitting: Hematology and Oncology

## 2016-07-18 ENCOUNTER — Ambulatory Visit (HOSPITAL_BASED_OUTPATIENT_CLINIC_OR_DEPARTMENT_OTHER): Payer: Medicare Other

## 2016-07-18 ENCOUNTER — Telehealth: Payer: Self-pay | Admitting: Hematology and Oncology

## 2016-07-18 ENCOUNTER — Ambulatory Visit (HOSPITAL_BASED_OUTPATIENT_CLINIC_OR_DEPARTMENT_OTHER): Payer: Medicare Other | Admitting: Hematology and Oncology

## 2016-07-18 VITALS — BP 113/64 | HR 65 | Temp 98.4°F | Resp 18 | Ht 69.0 in | Wt 173.0 lb

## 2016-07-18 DIAGNOSIS — E876 Hypokalemia: Secondary | ICD-10-CM

## 2016-07-18 DIAGNOSIS — D500 Iron deficiency anemia secondary to blood loss (chronic): Secondary | ICD-10-CM

## 2016-07-18 DIAGNOSIS — R935 Abnormal findings on diagnostic imaging of other abdominal regions, including retroperitoneum: Secondary | ICD-10-CM

## 2016-07-18 DIAGNOSIS — D518 Other vitamin B12 deficiency anemias: Secondary | ICD-10-CM

## 2016-07-18 DIAGNOSIS — E559 Vitamin D deficiency, unspecified: Secondary | ICD-10-CM

## 2016-07-18 LAB — FERRITIN: Ferritin: 508 ng/ml — ABNORMAL HIGH (ref 22–316)

## 2016-07-18 LAB — IRON AND TIBC
%SAT: 41 % (ref 20–55)
IRON: 106 ug/dL (ref 42–163)
TIBC: 258 ug/dL (ref 202–409)
UIBC: 151 ug/dL (ref 117–376)

## 2016-07-18 LAB — COMPREHENSIVE METABOLIC PANEL
ALBUMIN: 3.8 g/dL (ref 3.5–5.0)
ALK PHOS: 85 U/L (ref 40–150)
ALT: 12 U/L (ref 0–55)
ANION GAP: 7 meq/L (ref 3–11)
AST: 18 U/L (ref 5–34)
BUN: 5 mg/dL — AB (ref 7.0–26.0)
CALCIUM: 9.5 mg/dL (ref 8.4–10.4)
CO2: 29 mEq/L (ref 22–29)
CREATININE: 0.9 mg/dL (ref 0.7–1.3)
Chloride: 103 mEq/L (ref 98–109)
EGFR: >90 mL/min/{1.73_m2} (ref >90)
Glucose: 86 mg/dl (ref 70–140)
Potassium: 4.3 mEq/L (ref 3.5–5.1)
Sodium: 139 mEq/L (ref 136–145)
Total Bilirubin: 0.33 mg/dL (ref 0.20–1.20)
Total Protein: 7 g/dL (ref 6.4–8.3)

## 2016-07-18 LAB — CBC & DIFF AND RETIC
BASO%: 0.6 % (ref 0.0–2.0)
BASOS ABS: 0 10*3/uL (ref 0.0–0.1)
EOS%: 9.2 % — AB (ref 0.0–7.0)
Eosinophils Absolute: 0.5 10*3/uL (ref 0.0–0.5)
HEMATOCRIT: 28.8 % — AB (ref 38.4–49.9)
HEMOGLOBIN: 9.8 g/dL — AB (ref 13.0–17.1)
Immature Retic Fract: 2.8 % — ABNORMAL LOW (ref 3.00–10.60)
LYMPH%: 39 % (ref 14.0–49.0)
MCH: 30 pg (ref 27.2–33.4)
MCHC: 34 g/dL (ref 32.0–36.0)
MCV: 88.1 fL (ref 79.3–98.0)
MONO#: 0.8 10*3/uL (ref 0.1–0.9)
MONO%: 15.1 % — AB (ref 0.0–14.0)
NEUT#: 2 10*3/uL (ref 1.5–6.5)
NEUT%: 36.1 % — AB (ref 39.0–75.0)
Platelets: 168 10*3/uL (ref 140–400)
RBC: 3.27 10*6/uL — ABNORMAL LOW (ref 4.20–5.82)
RDW: 13.5 % (ref 11.0–14.6)
Retic %: 1.46 % (ref 0.80–1.80)
Retic Ct Abs: 47.74 10*3/uL (ref 34.80–93.90)
WBC: 5.4 10*3/uL (ref 4.0–10.3)
lymph#: 2.1 10*3/uL (ref 0.9–3.3)
nRBC: 0 % (ref 0–0)

## 2016-07-18 LAB — MAGNESIUM: MAGNESIUM: 2.2 mg/dL (ref 1.5–2.5)

## 2016-07-18 MED ORDER — CYANOCOBALAMIN 1000 MCG/ML IJ SOLN
1000.0000 ug | Freq: Once | INTRAMUSCULAR | Status: AC
Start: 2016-07-18 — End: 2016-07-18
  Administered 2016-07-18: 1000 ug via INTRAMUSCULAR

## 2016-07-18 NOTE — Assessment & Plan Note (Signed)
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

## 2016-07-18 NOTE — Progress Notes (Signed)
[name] CONSULT NOTE  Patient Care Team: Tamsen Roers, MD as PCP - General (Family Medicine)  CHIEF COMPLAINTS/PURPOSE OF CONSULTATION:  Severe anemia He is here accompanied by his wife, [name].  They were married for 10 years HISTORY OF PRESENTING ILLNESS:  [name] 51 y.o. male is here because of recent diagnosis of severe anemia He was hospitalized in February 2018 with presentation of severe electrolyte abnormalities MAYS PAINO history of small bowel perforation S/P ileocecectomy on October 2017 who required subsequent admission for septic shock secondary to ischemic bowel (with perforation at his anastomosis site causing intra-abdominal abscesses). 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 He also has a history of chronic pain with narcotic dependence and recurrent electrolyte abnormalities. He was also found to be profoundly anemic with hemoglobin of 6.4 He received blood transfusion and iron infusion prior to hospital discharge Currently, He denies recent chest pain on exertion, shortness of breath on minimal exertion, pre-syncopal episodes, or palpitations. He had not noticed any recent bleeding such as epistaxis, hematuria or hematochezia The patient denies over the counter NSAID ingestion. He is not on antiplatelets agents.  He had no prior history or diagnosis of cancer. His age appropriate screening programs are up-to-date. He denies any pica and eats a variety of diet. He never donated blood The patient was prescribed oral iron supplements and he takes 1 daily in the morning.  He turned his stool black He denies problems tolerating iron supplement On review of his electronic records, he was found to have profound B12 deficiency.  He was also noted to have abnormal CT scan with liver lesions.  MRI of the abdomen was recommended but this has not been ordered He has chronic back pain.  His pain is managed by pain clinic  MEDICAL HISTORY:  Past Medical History:   Diagnosis Date  . Anxiety    panic attacks  . Asthma   . Complication of anesthesia    " I got confused "  . Foot drop, left   . Headache    Migraine  . History of blood transfusion   . Ischemic bowel disease ([place])   . Pneumonia    in past  . Septic shock ([place]) 01/2016  . Small bowel perforation ([place]) 01/2016    SURGICAL HISTORY: Past Surgical History:  Procedure Laterality Date  . APPLICATION OF WOUND VAC N/A 02/08/2016   Procedure: APPLICATION OF WOUND VAC;  Surgeon: Judeth Horn, MD;  Location: [facility];  Service: General;  Laterality: N/A;  . BACK SURGERY     x3  . BOWEL RESECTION N/A 01/27/2016   Procedure: SMALL BOWEL RESECTION;  Surgeon: Greer Pickerel, MD;  Location: [facility];  Service: General;  Laterality: N/A;  . EXPLORATORY LAPAROTOMY  01/27/2016  . ILEOCECETOMY N/A 02/08/2016   Procedure: Pennie Rushing;  Surgeon: Judeth Horn, MD;  Location: [facility];  Service: General;  Laterality: N/A;  . ILEOSTOMY N/A 02/10/2016   Procedure: ILEOSTOMY;  Surgeon: Judeth Horn, MD;  Location: [facility];  Service: General;  Laterality: N/A;  . ILEOSTOMY CLOSURE N/A 06/07/2016   Procedure: ILEOSTOMY TAKEDOWN;  Surgeon: Judeth Horn, MD;  Location: [facility];  Service: General;  Laterality: N/A;  . LAPAROTOMY N/A 01/27/2016   Procedure: EXPLORATORY LAPAROTOMY;  Surgeon: Greer Pickerel, MD;  Location: [facility];  Service: General;  Laterality: N/A;  . LAPAROTOMY N/A 02/08/2016   Procedure: EXPLORATORY LAPAROTOMY;  Surgeon: Judeth Horn, MD;  Location: [facility];  Service: General;  Laterality: N/A;  . LAPAROTOMY  N/A 02/10/2016   Procedure: EXPLORATORY LAPAROTOMY;  Surgeon: Judeth Horn, MD;  Location: [facility];  Service: General;  Laterality: N/A;  . VACUUM ASSISTED CLOSURE CHANGE N/A 02/12/2016   Procedure: EXPLORATORY LAPAROTOMY WITH CLOSURE;  Surgeon: Erroll Luna, MD;  Location: [facility];  Service: General;  Laterality: N/A;    SOCIAL HISTORY: Social History   Social History  . Marital status: Married    Spouse  name: N/A  . Number of children: N/A  . Years of education: N/A   Occupational History  . Not on file.   Social History Main Topics  . Smoking status: Former Smoker    Packs/day: 0.50    Years: 34.00    Types: Cigarettes    Quit date: 01/27/2016  . Smokeless tobacco: Never Used  . Alcohol use No  . Drug use: No  . Sexual activity: Not on file   Other Topics Concern  . Not on file   Social History Narrative  . No narrative on file    FAMILY HISTORY: History reviewed. No pertinent family history.  ALLERGIES:  is allergic to celecoxib; clonazepam; cyclobenzaprine; dexamethasone; diazepam; etodolac; gabapentin; metaxalone; methocarbamol; pregabalin; propoxyphene; tramadol; latex; and tape.  MEDICATIONS:  Current Outpatient Prescriptions  Medication Sig Dispense Refill  . acetaminophen (TYLENOL) 325 MG tablet Take 325-650 mg by mouth every 6 (six) hours as needed for headache.     . albuterol (PROVENTIL HFA;VENTOLIN HFA) 108 (90 Base) MCG/ACT inhaler Inhale 2 puffs into the lungs every 6 (six) hours as needed for wheezing or shortness of breath.    . ALPRAZolam (XANAX) 0.5 MG tablet Take 0.25-0.5 mg by mouth 3 (three) times daily as needed for anxiety or sleep.     . ferrous sulfate 325 (65 FE) MG tablet Take 1 tablet (325 mg total) by mouth 3 (three) times daily with meals. 90 tablet 0  . loperamide (IMODIUM) 2 MG capsule Take 2 capsules (4 mg total) by mouth 4 (four) times daily. 30 capsule 0  . morphine (MSIR) 15 MG tablet 1-2 tablets every 8 hours prn pain    . Multiple Vitamin (MULTIVITAMIN WITH MINERALS) TABS tablet Take 1 tablet by mouth daily.    Marland Kitchen oxyCODONE (OXY IR/ROXICODONE) 5 MG immediate release tablet Take 1-2 tablets (5-10 mg total) by mouth every 6 (six) hours as needed for moderate pain. 40 tablet 0  . Oxymorphone HCl, Crush Resist, (OPANA ER, CRUSH RESISTANT,) 20 MG PO T12A Take 20 mg by mouth.    . potassium & sodium phosphates (PHOS-NAK) 280-160-250 MG PACK  Take 1 packet by mouth 4 (four) times daily. 120 packet 0  . psyllium (HYDROCIL/METAMUCIL) 95 % PACK Take 2 packets by mouth 3 (three) times daily. (Patient taking differently: Take 1 packet by mouth 4 (four) times daily. ) 180 each 0  . vitamin C (ASCORBIC ACID) 500 MG tablet Take 500 mg by mouth daily.    . furosemide (LASIX) 40 MG tablet Take 40 mg by mouth daily as needed for fluid or edema.     . paregoric 2 MG/5ML solution Take 5 mLs by mouth as needed for diarrhea or loose stools. (Patient not taking: Reported on 07/18/2016) 120 mL 0   No current facility-administered medications for this visit.     REVIEW OF SYSTEMS:   Constitutional: Denies fevers, chills or abnormal night sweats Eyes: Denies blurriness of vision, double vision or watery eyes Ears, nose, mouth, throat, and face: Denies mucositis or sore throat Respiratory: Denies cough, dyspnea or  wheezes Cardiovascular: Denies palpitation, chest discomfort or lower extremity swelling Gastrointestinal:  Denies nausea, heartburn or change in bowel habits Skin: Denies abnormal skin rashes Lymphatics: Denies new lymphadenopathy or easy bruising Neurological:Denies numbness, tingling or new weaknesses Behavioral/Psych: Mood is stable, no new changes  All other systems were reviewed with the patient and are negative.  PHYSICAL EXAMINATION: ECOG PERFORMANCE STATUS: 1 - Symptomatic but completely ambulatory  Vitals:   07/18/16 1016  BP: 113/64  Pulse: 65  Resp: 18  Temp: 98.4 F (36.9 C)   Filed Weights   07/18/16 1016  Weight: 173 lb (78.5 kg)    GENERAL:alert, no distress and comfortable SKIN: skin color, texture, turgor are normal, no rashes or significant lesions EYES: normal, conjunctiva are pink and non-injected, sclera clear OROPHARYNX:no exudate, no erythema and lips, buccal mucosa, and tongue normal  NECK: supple, thyroid normal size, non-tender, without nodularity LYMPH:  no palpable lymphadenopathy in the  cervical, axillary or inguinal LUNGS: clear to auscultation and percussion with normal breathing effort HEART: regular rate & rhythm and no murmurs and no lower extremity edema ABDOMEN:abdomen soft, non-tender and normal bowel sounds.  Well-healed surgical scar Musculoskeletal:no cyanosis of digits and no clubbing  PSYCH: alert & oriented x 3 with fluent speech NEURO: no focal motor/sensory deficits  RADIOGRAPHIC STUDIES:I reviewed his prior CT abdomen I have personally reviewed the radiological images as listed and agreed with the findings in the report.   ASSESSMENT & PLAN:  Iron deficiency anemia due to chronic blood loss The patient's tolerated oral iron supplement well Repeat blood work showed improvement of anemia He tolerated iron supplement well I recommend increasing iron supplement to twice a day and recheck blood work at the end of the month He does not need further blood transfusion on intravenous iron  B12 deficiency anemia He was found to have severe vitamin B12 deficiency could be secondary to malabsorption I recommend vitamin B12 injection weekly 1 month and then monthly We will start injection today  Abnormal CT of the abdomen He was found to have abnormal CT abdomen with liver lesions of unknown etiology Per recommendation from radiologist, I will order MRI of the abdomen Liver function tests were within normal limits  Orders Placed This Encounter  Procedures  . MR LIVER W WO CONTRAST    Standing Status:   Future    Standing Expiration Date:   09/17/2017    Order Specific Question:   If indicated for the ordered procedure, I authorize the administration of contrast media per Radiology protocol    Answer:   Yes    Order Specific Question:   Reason for Exam (SYMPTOM  OR DIAGNOSIS REQUIRED)    Answer:   liver lesion seen on CT    Order Specific Question:   What is the patient's sedation requirement?    Answer:   No Sedation    Order Specific Question:   Does the  patient have a pacemaker or implanted devices?    Answer:   No    Order Specific Question:   Preferred imaging location?    Answer:   [facility] (table limit-350 lbs)    Order Specific Question:   Radiology Contrast Protocol - do NOT remove file path    Answer:   [email or web address]  . Vitamin D 25 hydroxy    Standing Status:   Future    Number of Occurrences:   1    Standing Expiration Date:   08/08/2017     All  questions were answered. The patient knows to call the clinic with any problems, questions or concerns. I spent 40 minutes counseling the patient face to face. The total time spent in the appointment was 60 minutes and more than 50% was on counseling.     Heath Lark, MD 07/18/16 1:08 PM

## 2016-07-18 NOTE — Assessment & Plan Note (Signed)
He was found to have abnormal CT abdomen with liver lesions of unknown etiology Per recommendation from radiologist, I will order MRI of the abdomen Liver function tests were within normal limits

## 2016-07-18 NOTE — Telephone Encounter (Signed)
Appointments scheduled per 4.4.18 LOS. Patient given AVS report and calendars with future scheduled appointments.   Patient not available on 4.30.18 for follow up appointment. Requested to have follow up appointment on 5.1.18 in afternoon.

## 2016-07-18 NOTE — Assessment & Plan Note (Signed)
The patient's tolerated oral iron supplement well Repeat blood work showed improvement of anemia He tolerated iron supplement well I recommend increasing iron supplement to twice a day and recheck blood work at the end of the month He does not need further blood transfusion on intravenous iron

## 2016-07-19 ENCOUNTER — Telehealth: Payer: Self-pay

## 2016-07-19 LAB — VITAMIN D 25 HYDROXY (VIT D DEFICIENCY, FRACTURES): Vitamin D, 25-Hydroxy: 29.6 ng/mL — ABNORMAL LOW (ref 30.0–100.0)

## 2016-07-19 NOTE — Telephone Encounter (Signed)
-----   Message from Heath Lark, MD sent at 07/19/2016  9:03 AM EDT ----- Regarding: labs Most of his labs looks good Vitamin D is low Recommend 2000 units Po vitamin D OTC daily ----- Message ----- From: Interface, Lab In Three Zero One Sent: 07/18/2016  12:18 PM To: Heath Lark, MD

## 2016-07-19 NOTE — Telephone Encounter (Signed)
Called with below message. 

## 2016-07-24 ENCOUNTER — Other Ambulatory Visit: Payer: Self-pay | Admitting: Hematology and Oncology

## 2016-07-25 ENCOUNTER — Ambulatory Visit (HOSPITAL_BASED_OUTPATIENT_CLINIC_OR_DEPARTMENT_OTHER): Payer: Medicare Other

## 2016-07-25 VITALS — BP 120/58 | HR 71 | Temp 98.8°F | Resp 18

## 2016-07-25 DIAGNOSIS — D518 Other vitamin B12 deficiency anemias: Secondary | ICD-10-CM

## 2016-07-25 MED ORDER — CYANOCOBALAMIN 1000 MCG/ML IJ SOLN
1000.0000 ug | Freq: Once | INTRAMUSCULAR | Status: AC
Start: 2016-07-25 — End: 2016-07-25
  Administered 2016-07-25: 1000 ug via INTRAMUSCULAR

## 2016-07-25 NOTE — Patient Instructions (Signed)
Cyanocobalamin, Vitamin B12 injection What is this medicine? CYANOCOBALAMIN (sye an oh koe BAL a min) is a man made form of vitamin B12. Vitamin B12 is used in the growth of healthy blood cells, nerve cells, and proteins in the body. It also helps with the metabolism of fats and carbohydrates. This medicine is used to treat people who can not absorb vitamin B12. This medicine may be used for other purposes; ask your health care provider or pharmacist if you have questions. COMMON BRAND NAME(S): B-12 Compliance Kit, B-12 Injection Kit, Cyomin, LA-12, Nutri-Twelve, Physicians EZ Use B-12, Primabalt What should I tell my health care provider before I take this medicine? They need to know if you have any of these conditions: -kidney disease -Leber's disease -megaloblastic anemia -an unusual or allergic reaction to cyanocobalamin, cobalt, other medicines, foods, dyes, or preservatives -pregnant or trying to get pregnant -breast-feeding How should I use this medicine? This medicine is injected into a muscle or deeply under the skin. It is usually given by a health care professional in a clinic or doctor's office. However, your doctor may teach you how to inject yourself. Follow all instructions. Talk to your pediatrician regarding the use of this medicine in children. Special care may be needed. Overdosage: If you think you have taken too much of this medicine contact a poison control center or emergency room at once. NOTE: This medicine is only for you. Do not share this medicine with others. What if I miss a dose? If you are given your dose at a clinic or doctor's office, call to reschedule your appointment. If you give your own injections and you miss a dose, take it as soon as you can. If it is almost time for your next dose, take only that dose. Do not take double or extra doses. What may interact with this medicine? -colchicine -heavy alcohol intake This list may not describe all possible  interactions. Give your health care provider a list of all the medicines, herbs, non-prescription drugs, or dietary supplements you use. Also tell them if you smoke, drink alcohol, or use illegal drugs. Some items may interact with your medicine. What should I watch for while using this medicine? Visit your doctor or health care professional regularly. You may need blood work done while you are taking this medicine. You may need to follow a special diet. Talk to your doctor. Limit your alcohol intake and avoid smoking to get the best benefit. What side effects may I notice from receiving this medicine? Side effects that you should report to your doctor or health care professional as soon as possible: -allergic reactions like skin rash, itching or hives, swelling of the face, lips, or tongue -blue tint to skin -chest tightness, pain -difficulty breathing, wheezing -dizziness -red, swollen painful area on the leg Side effects that usually do not require medical attention (report to your doctor or health care professional if they continue or are bothersome): -diarrhea -headache This list may not describe all possible side effects. Call your doctor for medical advice about side effects. You may report side effects to FDA at 1-800-FDA-1088. Where should I keep my medicine? Keep out of the reach of children. Store at room temperature between 15 and 30 degrees C (59 and 85 degrees F). Protect from light. Throw away any unused medicine after the expiration date. NOTE: This sheet is a summary. It may not cover all possible information. If you have questions about this medicine, talk to your doctor, pharmacist, or   health care provider.  2018 Elsevier/Gold Standard (2007-07-14 22:10:20)  

## 2016-08-01 ENCOUNTER — Ambulatory Visit (HOSPITAL_BASED_OUTPATIENT_CLINIC_OR_DEPARTMENT_OTHER): Payer: Medicare Other

## 2016-08-01 VITALS — BP 102/52 | HR 72 | Temp 98.8°F | Resp 20

## 2016-08-01 DIAGNOSIS — D518 Other vitamin B12 deficiency anemias: Secondary | ICD-10-CM

## 2016-08-01 MED ORDER — CYANOCOBALAMIN 1000 MCG/ML IJ SOLN
1000.0000 ug | Freq: Once | INTRAMUSCULAR | Status: AC
  Administered 2016-08-01: 1000 ug via INTRAMUSCULAR

## 2016-08-01 NOTE — Patient Instructions (Signed)
Cyanocobalamin, Vitamin B12 injection What is this medicine? CYANOCOBALAMIN (sye an oh koe BAL a min) is a man made form of vitamin B12. Vitamin B12 is used in the growth of healthy blood cells, nerve cells, and proteins in the body. It also helps with the metabolism of fats and carbohydrates. This medicine is used to treat people who can not absorb vitamin B12. This medicine may be used for other purposes; ask your health care provider or pharmacist if you have questions. COMMON BRAND NAME(S): B-12 Compliance Kit, B-12 Injection Kit, Cyomin, LA-12, Nutri-Twelve, Physicians EZ Use B-12, Primabalt What should I tell my health care provider before I take this medicine? They need to know if you have any of these conditions: -kidney disease -Leber's disease -megaloblastic anemia -an unusual or allergic reaction to cyanocobalamin, cobalt, other medicines, foods, dyes, or preservatives -pregnant or trying to get pregnant -breast-feeding How should I use this medicine? This medicine is injected into a muscle or deeply under the skin. It is usually given by a health care professional in a clinic or doctor's office. However, your doctor may teach you how to inject yourself. Follow all instructions. Talk to your pediatrician regarding the use of this medicine in children. Special care may be needed. Overdosage: If you think you have taken too much of this medicine contact a poison control center or emergency room at once. NOTE: This medicine is only for you. Do not share this medicine with others. What if I miss a dose? If you are given your dose at a clinic or doctor's office, call to reschedule your appointment. If you give your own injections and you miss a dose, take it as soon as you can. If it is almost time for your next dose, take only that dose. Do not take double or extra doses. What may interact with this medicine? -colchicine -heavy alcohol intake This list may not describe all possible  interactions. Give your health care provider a list of all the medicines, herbs, non-prescription drugs, or dietary supplements you use. Also tell them if you smoke, drink alcohol, or use illegal drugs. Some items may interact with your medicine. What should I watch for while using this medicine? Visit your doctor or health care professional regularly. You may need blood work done while you are taking this medicine. You may need to follow a special diet. Talk to your doctor. Limit your alcohol intake and avoid smoking to get the best benefit. What side effects may I notice from receiving this medicine? Side effects that you should report to your doctor or health care professional as soon as possible: -allergic reactions like skin rash, itching or hives, swelling of the face, lips, or tongue -blue tint to skin -chest tightness, pain -difficulty breathing, wheezing -dizziness -red, swollen painful area on the leg Side effects that usually do not require medical attention (report to your doctor or health care professional if they continue or are bothersome): -diarrhea -headache This list may not describe all possible side effects. Call your doctor for medical advice about side effects. You may report side effects to FDA at 1-800-FDA-1088. Where should I keep my medicine? Keep out of the reach of children. Store at room temperature between 15 and 30 degrees C (59 and 85 degrees F). Protect from light. Throw away any unused medicine after the expiration date. NOTE: This sheet is a summary. It may not cover all possible information. If you have questions about this medicine, talk to your doctor, pharmacist, or   health care provider.  2018 Elsevier/Gold Standard (2007-07-14 22:10:20)  

## 2016-08-07 ENCOUNTER — Other Ambulatory Visit: Payer: Self-pay | Admitting: Hematology and Oncology

## 2016-08-07 DIAGNOSIS — D518 Other vitamin B12 deficiency anemias: Secondary | ICD-10-CM

## 2016-08-08 ENCOUNTER — Ambulatory Visit (HOSPITAL_BASED_OUTPATIENT_CLINIC_OR_DEPARTMENT_OTHER): Payer: Medicare Other

## 2016-08-08 ENCOUNTER — Ambulatory Visit (HOSPITAL_COMMUNITY)
Admission: RE | Admit: 2016-08-08 | Discharge: 2016-08-08 | Disposition: A | Discharge status: Home / Self Care | Payer: Worker's Compensation | Source: Ambulatory Visit | Attending: Hematology and Oncology | Admitting: Hematology and Oncology

## 2016-08-08 ENCOUNTER — Other Ambulatory Visit (HOSPITAL_BASED_OUTPATIENT_CLINIC_OR_DEPARTMENT_OTHER): Payer: Medicare Other

## 2016-08-08 VITALS — BP 120/65 | HR 63 | Temp 98.4°F | Resp 18

## 2016-08-08 DIAGNOSIS — Q453 Other congenital malformations of pancreas and pancreatic duct: Secondary | ICD-10-CM | POA: Insufficient documentation

## 2016-08-08 DIAGNOSIS — K769 Liver disease, unspecified: Secondary | ICD-10-CM | POA: Insufficient documentation

## 2016-08-08 DIAGNOSIS — D518 Other vitamin B12 deficiency anemias: Secondary | ICD-10-CM

## 2016-08-08 DIAGNOSIS — R935 Abnormal findings on diagnostic imaging of other abdominal regions, including retroperitoneum: Secondary | ICD-10-CM | POA: Diagnosis not present

## 2016-08-08 DIAGNOSIS — R590 Localized enlarged lymph nodes: Secondary | ICD-10-CM | POA: Diagnosis not present

## 2016-08-08 DIAGNOSIS — D1809 Hemangioma of other sites: Secondary | ICD-10-CM | POA: Diagnosis not present

## 2016-08-08 DIAGNOSIS — N2889 Other specified disorders of kidney and ureter: Secondary | ICD-10-CM | POA: Insufficient documentation

## 2016-08-08 LAB — COMPREHENSIVE METABOLIC PANEL
ALT: 13 U/L (ref 0–55)
ANION GAP: 8 meq/L (ref 3–11)
AST: 19 U/L (ref 5–34)
Albumin: 3.9 g/dL (ref 3.5–5.0)
Alkaline Phosphatase: 78 U/L (ref 40–150)
BILIRUBIN TOTAL: 0.33 mg/dL (ref 0.20–1.20)
BUN: 3.3 mg/dL — ABNORMAL LOW (ref 7.0–26.0)
CALCIUM: 9.3 mg/dL (ref 8.4–10.4)
CO2: 31 meq/L — AB (ref 22–29)
CREATININE: 0.9 mg/dL (ref 0.7–1.3)
Chloride: 103 mEq/L (ref 98–109)
EGFR: >90 mL/min/{1.73_m2} (ref >90)
Glucose: 95 mg/dl (ref 70–140)
Potassium: 4 mEq/L (ref 3.5–5.1)
Sodium: 142 mEq/L (ref 136–145)
TOTAL PROTEIN: 6.8 g/dL (ref 6.4–8.3)

## 2016-08-08 LAB — CBC & DIFF AND RETIC
BASO%: 0.6 % (ref 0.0–2.0)
BASOS ABS: 0 10*3/uL (ref 0.0–0.1)
EOS%: 6.1 % (ref 0.0–7.0)
Eosinophils Absolute: 0.3 10*3/uL (ref 0.0–0.5)
HEMATOCRIT: 29 % — AB (ref 38.4–49.9)
HGB: 9.8 g/dL — ABNORMAL LOW (ref 13.0–17.1)
IMMATURE RETIC FRACT: 1.4 % — AB (ref 3.00–10.60)
LYMPH%: 32.9 % (ref 14.0–49.0)
MCH: 30.4 pg (ref 27.2–33.4)
MCHC: 33.8 g/dL (ref 32.0–36.0)
MCV: 90.1 fL (ref 79.3–98.0)
MONO#: 0.6 10*3/uL (ref 0.1–0.9)
MONO%: 12.8 % (ref 0.0–14.0)
NEUT#: 2.4 10*3/uL (ref 1.5–6.5)
NEUT%: 47.6 % (ref 39.0–75.0)
PLATELETS: 141 10*3/uL (ref 140–400)
RBC: 3.22 10*6/uL — AB (ref 4.20–5.82)
RDW: 13.2 % (ref 11.0–14.6)
Retic %: 1.31 % (ref 0.80–1.80)
Retic Ct Abs: 42.18 10*3/uL (ref 34.80–93.90)
WBC: 4.9 10*3/uL (ref 4.0–10.3)
lymph#: 1.6 10*3/uL (ref 0.9–3.3)

## 2016-08-08 MED ORDER — CYANOCOBALAMIN 1000 MCG/ML IJ SOLN
1000.0000 ug | Freq: Once | INTRAMUSCULAR | Status: AC
  Administered 2016-08-08: 1000 ug via INTRAMUSCULAR

## 2016-08-08 MED ORDER — GADOBENATE DIMEGLUMINE 529 MG/ML IV SOLN
20.0000 mL | Freq: Once | INTRAVENOUS | Status: AC | PRN
  Administered 2016-08-08: 17 mL via INTRAVENOUS

## 2016-08-08 NOTE — Patient Instructions (Signed)
Cyanocobalamin, Vitamin B12 injection What is this medicine? CYANOCOBALAMIN (sye an oh koe BAL a min) is a man made form of vitamin B12. Vitamin B12 is used in the growth of healthy blood cells, nerve cells, and proteins in the body. It also helps with the metabolism of fats and carbohydrates. This medicine is used to treat people who can not absorb vitamin B12. This medicine may be used for other purposes; ask your health care provider or pharmacist if you have questions. COMMON BRAND NAME(S): B-12 Compliance Kit, B-12 Injection Kit, Cyomin, LA-12, Nutri-Twelve, Physicians EZ Use B-12, Primabalt What should I tell my health care provider before I take this medicine? They need to know if you have any of these conditions: -kidney disease -Leber's disease -megaloblastic anemia -an unusual or allergic reaction to cyanocobalamin, cobalt, other medicines, foods, dyes, or preservatives -pregnant or trying to get pregnant -breast-feeding How should I use this medicine? This medicine is injected into a muscle or deeply under the skin. It is usually given by a health care professional in a clinic or doctor's office. However, your doctor may teach you how to inject yourself. Follow all instructions. Talk to your pediatrician regarding the use of this medicine in children. Special care may be needed. Overdosage: If you think you have taken too much of this medicine contact a poison control center or emergency room at once. NOTE: This medicine is only for you. Do not share this medicine with others. What if I miss a dose? If you are given your dose at a clinic or doctor's office, call to reschedule your appointment. If you give your own injections and you miss a dose, take it as soon as you can. If it is almost time for your next dose, take only that dose. Do not take double or extra doses. What may interact with this medicine? -colchicine -heavy alcohol intake This list may not describe all possible  interactions. Give your health care provider a list of all the medicines, herbs, non-prescription drugs, or dietary supplements you use. Also tell them if you smoke, drink alcohol, or use illegal drugs. Some items may interact with your medicine. What should I watch for while using this medicine? Visit your doctor or health care professional regularly. You may need blood work done while you are taking this medicine. You may need to follow a special diet. Talk to your doctor. Limit your alcohol intake and avoid smoking to get the best benefit. What side effects may I notice from receiving this medicine? Side effects that you should report to your doctor or health care professional as soon as possible: -allergic reactions like skin rash, itching or hives, swelling of the face, lips, or tongue -blue tint to skin -chest tightness, pain -difficulty breathing, wheezing -dizziness -red, swollen painful area on the leg Side effects that usually do not require medical attention (report to your doctor or health care professional if they continue or are bothersome): -diarrhea -headache This list may not describe all possible side effects. Call your doctor for medical advice about side effects. You may report side effects to FDA at 1-800-FDA-1088. Where should I keep my medicine? Keep out of the reach of children. Store at room temperature between 15 and 30 degrees C (59 and 85 degrees F). Protect from light. Throw away any unused medicine after the expiration date. NOTE: This sheet is a summary. It may not cover all possible information. If you have questions about this medicine, talk to your doctor, pharmacist, or   health care provider.  2018 Elsevier/Gold Standard (2007-07-14 22:10:20)  

## 2016-08-14 ENCOUNTER — Ambulatory Visit (HOSPITAL_BASED_OUTPATIENT_CLINIC_OR_DEPARTMENT_OTHER): Payer: Medicare Other | Admitting: Hematology and Oncology

## 2016-08-14 ENCOUNTER — Other Ambulatory Visit: Payer: Self-pay | Admitting: Hematology and Oncology

## 2016-08-14 ENCOUNTER — Telehealth: Payer: Self-pay | Admitting: Hematology and Oncology

## 2016-08-14 DIAGNOSIS — E559 Vitamin D deficiency, unspecified: Secondary | ICD-10-CM

## 2016-08-14 DIAGNOSIS — D500 Iron deficiency anemia secondary to blood loss (chronic): Secondary | ICD-10-CM | POA: Diagnosis not present

## 2016-08-14 DIAGNOSIS — R935 Abnormal findings on diagnostic imaging of other abdominal regions, including retroperitoneum: Secondary | ICD-10-CM | POA: Diagnosis not present

## 2016-08-14 DIAGNOSIS — D518 Other vitamin B12 deficiency anemias: Secondary | ICD-10-CM | POA: Diagnosis not present

## 2016-08-14 DIAGNOSIS — D1803 Hemangioma of intra-abdominal structures: Secondary | ICD-10-CM | POA: Insufficient documentation

## 2016-08-14 NOTE — Telephone Encounter (Signed)
Gave patient avs report and appointments for May thru July. [facility] radiology will call re mri.

## 2016-08-16 ENCOUNTER — Encounter: Payer: Self-pay | Admitting: Hematology and Oncology

## 2016-08-16 NOTE — Assessment & Plan Note (Addendum)
He has received 4 doses of weekly vitamin B12 injections I recommend continue B12 injections monthly I plan to recheck serum vitamin B12 in the next visit

## 2016-08-16 NOTE — Assessment & Plan Note (Signed)
He was noted to have severe vitamin D deficiency recently I recommend vitamin D replacement therapy and recheck serum vitamin D level in his next visit

## 2016-08-16 NOTE — Progress Notes (Signed)
[name], MD SUMMARY OF HEMATOLOGIC HISTORY:  [name] 51 y.o. male is here because of recent diagnosis of severe anemia He was hospitalized in February 2018 with presentation of severe electrolyte abnormalities OTHO MICHALIK history of small bowel perforation S/P ileocecectomy on October 2017 who required subsequent admission for septic shock secondary to ischemic bowel (with perforation at his anastomosis site causing intra-abdominal abscesses). 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 He also has a history of chronic pain with narcotic dependence and recurrent electrolyte abnormalities. He was also found to be profoundly anemic with hemoglobin of 6.4 He received blood transfusion and iron infusion prior to hospital discharge On review of his electronic records, he was found to have profound B12 deficiency.  He was started on vitamin B12 replacement therapy.  He was also noted to have abnormal CT scan with liver lesions.  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 He has chronic back pain.  His pain is managed by pain clinic  INTERVAL HISTORY: [name] 51 y.o. male returns for further follow-up. He continues to have chronic back pain. He is compliant taking all the supplements as directed. The patient denies any recent signs or symptoms of bleeding such as spontaneous epistaxis, hematuria or hematochezia.   I have reviewed the past medical history, past surgical history, social history and family history with the patient and they are unchanged from previous note.  ALLERGIES:  is allergic to celecoxib; cyclobenzaprine; dexamethasone; diazepam; etodolac; gabapentin; metaxalone; methocarbamol; pregabalin; propoxyphene; tape; tramadol; clonazepam; and latex.  MEDICATIONS:  Current Outpatient Prescriptions  Medication Sig Dispense Refill  . acetaminophen (TYLENOL) 325 MG tablet Take 325-650 mg by mouth every 6 (six) hours as  needed for headache.     . albuterol (PROVENTIL HFA;VENTOLIN HFA) 108 (90 Base) MCG/ACT inhaler Inhale 2 puffs into the lungs every 6 (six) hours as needed for wheezing or shortness of breath.    . ALPRAZolam (XANAX) 0.5 MG tablet Take 0.25-0.5 mg by mouth 3 (three) times daily as needed for anxiety or sleep.     . ferrous sulfate 325 (65 FE) MG tablet Take 1 tablet (325 mg total) by mouth 3 (three) times daily with meals. 90 tablet 0  . furosemide (LASIX) 40 MG tablet Take 40 mg by mouth daily as needed for fluid or edema.     Marland Kitchen loperamide (IMODIUM) 2 MG capsule Take 2 capsules (4 mg total) by mouth 4 (four) times daily. 30 capsule 0  . morphine (MSIR) 15 MG tablet 1-2 tablets every 8 hours prn pain    . Multiple Vitamin (MULTIVITAMIN WITH MINERALS) TABS tablet Take 1 tablet by mouth daily.    Marland Kitchen oxyCODONE (OXY IR/ROXICODONE) 5 MG immediate release tablet Take 1-2 tablets (5-10 mg total) by mouth every 6 (six) hours as needed for moderate pain. 40 tablet 0  . Oxymorphone HCl, Crush Resist, (OPANA ER, CRUSH RESISTANT,) 20 MG PO T12A Take 20 mg by mouth.    . paregoric 2 MG/5ML solution Take 5 mLs by mouth as needed for diarrhea or loose stools. 120 mL 0  . potassium & sodium phosphates (PHOS-NAK) 280-160-250 MG PACK Take 1 packet by mouth 4 (four) times daily. 120 packet 0  . psyllium (HYDROCIL/METAMUCIL) 95 % PACK Take 2 packets by mouth 3 (three) times daily. (Patient taking differently: Take 1 packet by mouth 4 (four) times daily. ) 180 each 0  . vitamin C (ASCORBIC ACID) 500 MG tablet  Take 500 mg by mouth daily.     No current facility-administered medications for this visit.      REVIEW OF SYSTEMS:   Constitutional: Denies fevers, chills or night sweats Eyes: Denies blurriness of vision Ears, nose, mouth, throat, and face: Denies mucositis or sore throat Respiratory: Denies cough, dyspnea or wheezes Cardiovascular: Denies palpitation, chest discomfort or lower extremity  swelling Gastrointestinal:  Denies nausea, heartburn or change in bowel habits Skin: Denies abnormal skin rashes Lymphatics: Denies new lymphadenopathy or easy bruising Neurological:Denies numbness, tingling or new weaknesses Behavioral/Psych: Mood is stable, no new changes  All other systems were reviewed with the patient and are negative.  PHYSICAL EXAMINATION: ECOG PERFORMANCE STATUS: 0 - Asymptomatic  Vitals:   08/14/16 1332  BP: 105/63  Pulse: 78  Resp: 20  Temp: 98.6 F (37 C)   Filed Weights   08/14/16 1332  Weight: 176 lb 3.2 oz (79.9 kg)    GENERAL:alert, no distress and comfortable SKIN: skin color, texture, turgor are normal, no rashes or significant lesions EYES: normal, Conjunctiva are pink and non-injected, sclera clear Musculoskeletal:no cyanosis of digits and no clubbing  NEURO: alert & oriented x 3 with fluent speech, no focal motor/sensory deficits  LABORATORY DATA:  I have reviewed the data as listed     Component Value Date/Time   NA 142 08/08/2016 1326   K 4.0 08/08/2016 1326   CL 106 06/12/2016 0544   CO2 31 (H) 08/08/2016 1326   GLUCOSE 95 08/08/2016 1326   BUN 3.3 (L) 08/08/2016 1326   CREATININE 0.9 08/08/2016 1326   CALCIUM 9.3 08/08/2016 1326   PROT 6.8 08/08/2016 1326   ALBUMIN 3.9 08/08/2016 1326   AST 19 08/08/2016 1326   ALT 13 08/08/2016 1326   ALKPHOS 78 08/08/2016 1326   BILITOT 0.33 08/08/2016 1326   GFRNONAA >60 06/12/2016 0544   GFRAA >60 06/12/2016 0544    No results found for: SPEP, UPEP  Lab Results  Component Value Date   WBC 4.9 08/08/2016   NEUTROABS 2.4 08/08/2016   HGB 9.8 (L) 08/08/2016   HCT 29.0 (L) 08/08/2016   MCV 90.1 08/08/2016   PLT 141 08/08/2016      Chemistry      Component Value Date/Time   NA 142 08/08/2016 1326   K 4.0 08/08/2016 1326   CL 106 06/12/2016 0544   CO2 31 (H) 08/08/2016 1326   BUN 3.3 (L) 08/08/2016 1326   CREATININE 0.9 08/08/2016 1326      Component Value Date/Time    CALCIUM 9.3 08/08/2016 1326   ALKPHOS 78 08/08/2016 1326   AST 19 08/08/2016 1326   ALT 13 08/08/2016 1326   BILITOT 0.33 08/08/2016 1326       RADIOGRAPHIC STUDIES: I reviewed the imaging study with the patient and caseworkers I have personally reviewed the radiological images as listed and agreed with the findings in the report.   ASSESSMENT & PLAN:  Iron deficiency anemia due to chronic blood loss The patient's tolerated oral iron supplement well He does not need further blood transfusion on intravenous iron  B12 deficiency anemia He has received 4 doses of weekly vitamin B12 injections I recommend continue B12 injections monthly I plan to recheck serum vitamin B12 in the next visit  Abnormal CT of the abdomen MRI showed signs of hemangiomas Per recommendation from radiologist, we will repeat another MRI in 3 months  Vitamin D deficiency He was noted to have severe vitamin D deficiency recently I recommend  vitamin D replacement therapy and recheck serum vitamin D level in his next visit   Orders Placed This Encounter  Procedures  . Sedimentation rate    Standing Status:   Future    Standing Expiration Date:   10/14/2017    All questions were answered. The patient knows to call the clinic with any problems, questions or concerns. No barriers to learning was detected.  I spent 15 minutes counseling the patient face to face. The total time spent in the appointment was 20 minutes and more than 50% was on counseling.     Heath Lark, MD 5/3/20186:22 AM

## 2016-08-16 NOTE — Assessment & Plan Note (Signed)
MRI showed signs of hemangiomas Per recommendation from radiologist, we will repeat another MRI in 3 months

## 2016-08-16 NOTE — Assessment & Plan Note (Signed)
The patient's tolerated oral iron supplement well He does not need further blood transfusion on intravenous iron

## 2016-09-07 ENCOUNTER — Ambulatory Visit (HOSPITAL_BASED_OUTPATIENT_CLINIC_OR_DEPARTMENT_OTHER): Payer: Medicare Other

## 2016-09-07 VITALS — BP 119/58 | HR 66 | Temp 98.6°F | Resp 18

## 2016-09-07 DIAGNOSIS — D518 Other vitamin B12 deficiency anemias: Secondary | ICD-10-CM | POA: Diagnosis not present

## 2016-09-07 MED ORDER — CYANOCOBALAMIN 1000 MCG/ML IJ SOLN
1000.0000 ug | Freq: Once | INTRAMUSCULAR | Status: AC
  Administered 2016-09-07: 1000 ug via INTRAMUSCULAR

## 2016-09-07 NOTE — Patient Instructions (Signed)
Cyanocobalamin, Vitamin B12 injection What is this medicine? CYANOCOBALAMIN (sye an oh koe BAL a min) is a man made form of vitamin B12. Vitamin B12 is used in the growth of healthy blood cells, nerve cells, and proteins in the body. It also helps with the metabolism of fats and carbohydrates. This medicine is used to treat people who can not absorb vitamin B12. This medicine may be used for other purposes; ask your health care provider or pharmacist if you have questions. COMMON BRAND NAME(S): B-12 Compliance Kit, B-12 Injection Kit, Cyomin, LA-12, Nutri-Twelve, Physicians EZ Use B-12, Primabalt What should I tell my health care provider before I take this medicine? They need to know if you have any of these conditions: -kidney disease -Leber's disease -megaloblastic anemia -an unusual or allergic reaction to cyanocobalamin, cobalt, other medicines, foods, dyes, or preservatives -pregnant or trying to get pregnant -breast-feeding How should I use this medicine? This medicine is injected into a muscle or deeply under the skin. It is usually given by a health care professional in a clinic or doctor's office. However, your doctor may teach you how to inject yourself. Follow all instructions. Talk to your pediatrician regarding the use of this medicine in children. Special care may be needed. Overdosage: If you think you have taken too much of this medicine contact a poison control center or emergency room at once. NOTE: This medicine is only for you. Do not share this medicine with others. What if I miss a dose? If you are given your dose at a clinic or doctor's office, call to reschedule your appointment. If you give your own injections and you miss a dose, take it as soon as you can. If it is almost time for your next dose, take only that dose. Do not take double or extra doses. What may interact with this medicine? -colchicine -heavy alcohol intake This list may not describe all possible  interactions. Give your health care provider a list of all the medicines, herbs, non-prescription drugs, or dietary supplements you use. Also tell them if you smoke, drink alcohol, or use illegal drugs. Some items may interact with your medicine. What should I watch for while using this medicine? Visit your doctor or health care professional regularly. You may need blood work done while you are taking this medicine. You may need to follow a special diet. Talk to your doctor. Limit your alcohol intake and avoid smoking to get the best benefit. What side effects may I notice from receiving this medicine? Side effects that you should report to your doctor or health care professional as soon as possible: -allergic reactions like skin rash, itching or hives, swelling of the face, lips, or tongue -blue tint to skin -chest tightness, pain -difficulty breathing, wheezing -dizziness -red, swollen painful area on the leg Side effects that usually do not require medical attention (report to your doctor or health care professional if they continue or are bothersome): -diarrhea -headache This list may not describe all possible side effects. Call your doctor for medical advice about side effects. You may report side effects to FDA at 1-800-FDA-1088. Where should I keep my medicine? Keep out of the reach of children. Store at room temperature between 15 and 30 degrees C (59 and 85 degrees F). Protect from light. Throw away any unused medicine after the expiration date. NOTE: This sheet is a summary. It may not cover all possible information. If you have questions about this medicine, talk to your doctor, pharmacist, or   health care provider.  2018 Elsevier/Gold Standard (2007-07-14 22:10:20)  

## 2016-10-08 ENCOUNTER — Ambulatory Visit (HOSPITAL_BASED_OUTPATIENT_CLINIC_OR_DEPARTMENT_OTHER): Payer: Medicare Other

## 2016-10-08 VITALS — BP 127/64 | HR 57 | Temp 98.0°F | Resp 18

## 2016-10-08 DIAGNOSIS — D518 Other vitamin B12 deficiency anemias: Secondary | ICD-10-CM | POA: Diagnosis not present

## 2016-10-08 MED ORDER — CYANOCOBALAMIN 1000 MCG/ML IJ SOLN
1000.0000 ug | Freq: Once | INTRAMUSCULAR | Status: AC
  Administered 2016-10-08: 1000 ug via INTRAMUSCULAR

## 2016-11-05 ENCOUNTER — Ambulatory Visit (HOSPITAL_COMMUNITY)
Admission: RE | Admit: 2016-11-05 | Discharge: 2016-11-05 | Disposition: A | Discharge status: Home / Self Care | Payer: Medicare Other | Source: Ambulatory Visit | Attending: Hematology and Oncology | Admitting: Hematology and Oncology

## 2016-11-05 DIAGNOSIS — Q453 Other congenital malformations of pancreas and pancreatic duct: Secondary | ICD-10-CM | POA: Diagnosis not present

## 2016-11-05 DIAGNOSIS — D1803 Hemangioma of intra-abdominal structures: Secondary | ICD-10-CM | POA: Insufficient documentation

## 2016-11-05 MED ORDER — GADOBENATE DIMEGLUMINE 529 MG/ML IV SOLN
20.0000 mL | Freq: Once | INTRAVENOUS | Status: AC | PRN
  Administered 2016-11-05: 17 mL via INTRAVENOUS

## 2016-11-07 ENCOUNTER — Ambulatory Visit: Payer: Self-pay

## 2016-11-07 ENCOUNTER — Other Ambulatory Visit (HOSPITAL_BASED_OUTPATIENT_CLINIC_OR_DEPARTMENT_OTHER): Payer: Medicare Other

## 2016-11-07 DIAGNOSIS — D1803 Hemangioma of intra-abdominal structures: Secondary | ICD-10-CM | POA: Diagnosis not present

## 2016-11-07 DIAGNOSIS — R5382 Chronic fatigue, unspecified: Secondary | ICD-10-CM | POA: Diagnosis not present

## 2016-11-07 DIAGNOSIS — E559 Vitamin D deficiency, unspecified: Secondary | ICD-10-CM

## 2016-11-07 DIAGNOSIS — D518 Other vitamin B12 deficiency anemias: Secondary | ICD-10-CM

## 2016-11-07 DIAGNOSIS — Z862 Personal history of diseases of the blood and blood-forming organs and certain disorders involving the immune mechanism: Secondary | ICD-10-CM

## 2016-11-07 DIAGNOSIS — D500 Iron deficiency anemia secondary to blood loss (chronic): Secondary | ICD-10-CM

## 2016-11-07 LAB — CBC & DIFF AND RETIC
BASO%: 0.6 % (ref 0.0–2.0)
BASOS ABS: 0 10*3/uL (ref 0.0–0.1)
EOS%: 4.6 % (ref 0.0–7.0)
Eosinophils Absolute: 0.3 10*3/uL (ref 0.0–0.5)
HEMATOCRIT: 37.4 % — AB (ref 38.4–49.9)
HGB: 12.5 g/dL — ABNORMAL LOW (ref 13.0–17.1)
Immature Retic Fract: 1 % — ABNORMAL LOW (ref 3.00–10.60)
LYMPH%: 21.8 % (ref 14.0–49.0)
MCH: 30.2 pg (ref 27.2–33.4)
MCHC: 33.4 g/dL (ref 32.0–36.0)
MCV: 90.3 fL (ref 79.3–98.0)
MONO#: 0.8 10*3/uL (ref 0.1–0.9)
MONO%: 11.9 % (ref 0.0–14.0)
NEUT#: 4.1 10*3/uL (ref 1.5–6.5)
NEUT%: 61.1 % (ref 39.0–75.0)
PLATELETS: 167 10*3/uL (ref 140–400)
RBC: 4.14 10*6/uL — ABNORMAL LOW (ref 4.20–5.82)
RDW: 12.4 % (ref 11.0–14.6)
Retic %: 1.15 % (ref 0.80–1.80)
Retic Ct Abs: 47.61 10*3/uL (ref 34.80–93.90)
WBC: 6.7 10*3/uL (ref 4.0–10.3)
lymph#: 1.5 10*3/uL (ref 0.9–3.3)

## 2016-11-07 LAB — COMPREHENSIVE METABOLIC PANEL
ALT: 30 U/L (ref 0–55)
AST: 31 U/L (ref 5–34)
Albumin: 4.5 g/dL (ref 3.5–5.0)
Alkaline Phosphatase: 98 U/L (ref 40–150)
Anion Gap: 10 mEq/L (ref 3–11)
BILIRUBIN TOTAL: 0.45 mg/dL (ref 0.20–1.20)
BUN: 3.2 mg/dL — AB (ref 7.0–26.0)
CO2: 30 meq/L — AB (ref 22–29)
CREATININE: 1 mg/dL (ref 0.7–1.3)
Calcium: 10.1 mg/dL (ref 8.4–10.4)
Chloride: 103 mEq/L (ref 98–109)
EGFR: >90 mL/min/{1.73_m2} (ref >90)
GLUCOSE: 87 mg/dL (ref 70–140)
Potassium: 4.1 mEq/L (ref 3.5–5.1)
SODIUM: 143 meq/L (ref 136–145)
TOTAL PROTEIN: 7.9 g/dL (ref 6.4–8.3)

## 2016-11-08 ENCOUNTER — Ambulatory Visit (HOSPITAL_BASED_OUTPATIENT_CLINIC_OR_DEPARTMENT_OTHER): Payer: Medicare Other

## 2016-11-08 ENCOUNTER — Ambulatory Visit (HOSPITAL_BASED_OUTPATIENT_CLINIC_OR_DEPARTMENT_OTHER): Payer: Medicare Other | Admitting: Hematology and Oncology

## 2016-11-08 ENCOUNTER — Encounter: Payer: Self-pay | Admitting: Hematology and Oncology

## 2016-11-08 VITALS — BP 132/71 | HR 60 | Temp 98.3°F | Resp 18 | Ht 69.0 in | Wt 175.5 lb

## 2016-11-08 DIAGNOSIS — D518 Other vitamin B12 deficiency anemias: Secondary | ICD-10-CM

## 2016-11-08 DIAGNOSIS — E538 Deficiency of other specified B group vitamins: Secondary | ICD-10-CM | POA: Diagnosis not present

## 2016-11-08 DIAGNOSIS — R5382 Chronic fatigue, unspecified: Secondary | ICD-10-CM

## 2016-11-08 DIAGNOSIS — R935 Abnormal findings on diagnostic imaging of other abdominal regions, including retroperitoneum: Secondary | ICD-10-CM | POA: Diagnosis not present

## 2016-11-08 DIAGNOSIS — R5383 Other fatigue: Secondary | ICD-10-CM | POA: Insufficient documentation

## 2016-11-08 DIAGNOSIS — D500 Iron deficiency anemia secondary to blood loss (chronic): Secondary | ICD-10-CM

## 2016-11-08 LAB — VITAMIN D 25 HYDROXY (VIT D DEFICIENCY, FRACTURES): Vitamin D, 25-Hydroxy: 32.1 ng/mL (ref 30.0–100.0)

## 2016-11-08 LAB — SEDIMENTATION RATE: SED RATE: 2 mm/h (ref 0–30)

## 2016-11-08 LAB — VITAMIN B12: Vitamin B12: 554 pg/mL (ref 232–1245)

## 2016-11-08 MED ORDER — CYANOCOBALAMIN 1000 MCG/ML IJ SOLN
1000.0000 ug | Freq: Once | INTRAMUSCULAR | Status: AC
  Administered 2016-11-08: 1000 ug via INTRAMUSCULAR

## 2016-11-08 NOTE — Assessment & Plan Note (Signed)
Repeat imaging study of the abdomen confirmed hemangioma, unchanged No further workup is necessary

## 2016-11-08 NOTE — Assessment & Plan Note (Signed)
B12 deficiency had resolved I recommend spacing out B12 injection to every 3 months for the next year and I plan to recheck again next year

## 2016-11-08 NOTE — Progress Notes (Signed)
[facility] OFFICE PROGRESS NOTE  Tamsen Roers, MD SUMMARY OF HEMATOLOGIC HISTORY:  [name] 51 y.o. male is here because of recent diagnosis of severe anemia He was hospitalized in February 2018 with presentation of severe electrolyte abnormalities [name] history of small bowel perforation S/P ileocecectomy on October 2017 who required subsequent admission for septic shock secondary to ischemic bowel (with perforation at his anastomosis site causing intra-abdominal abscesses). 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 He also has a history of chronic pain with narcotic dependence and recurrent electrolyte abnormalities. He was also found to be profoundly anemic with hemoglobin of 6.4 He received blood transfusion and iron infusion prior to hospital discharge On review of his electronic records, he was found to have profound B12 deficiency.  He was started on vitamin B12 replacement therapy.  He was also noted to have abnormal CT scan with liver lesions.  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 He has chronic back pain.  His pain is managed by pain clinic  INTERVAL HISTORY: [name] 51 y.o. male returns for further follow-up. He complained of excessive fatigue He continues to have chronic pain managed through the pain clinic The patient denies any recent signs or symptoms of bleeding such as spontaneous epistaxis, hematuria or hematochezia.   I have reviewed the past medical history, past surgical history, social history and family history with the patient and they are unchanged from previous note.  ALLERGIES:  is allergic to celecoxib; cyclobenzaprine; dexamethasone; diazepam; etodolac; gabapentin; metaxalone; methocarbamol; pregabalin; propoxyphene; tape; tramadol; clonazepam; and latex.  MEDICATIONS:  Current Outpatient Prescriptions  Medication Sig Dispense Refill  . acetaminophen (TYLENOL) 325 MG tablet Take 325-650 mg by mouth every 6 (six)  hours as needed for headache.     . albuterol (PROVENTIL HFA;VENTOLIN HFA) 108 (90 Base) MCG/ACT inhaler Inhale 2 puffs into the lungs every 6 (six) hours as needed for wheezing or shortness of breath.    . ALPRAZolam (XANAX) 0.5 MG tablet Take 0.25-0.5 mg by mouth 3 (three) times daily as needed for anxiety or sleep.     . ferrous sulfate 325 (65 FE) MG tablet Take 1 tablet (325 mg total) by mouth 3 (three) times daily with meals. 90 tablet 0  . furosemide (LASIX) 40 MG tablet Take 40 mg by mouth daily as needed for fluid or edema.     Marland Kitchen loperamide (IMODIUM) 2 MG capsule Take 2 capsules (4 mg total) by mouth 4 (four) times daily. 30 capsule 0  . morphine (MSIR) 15 MG tablet 1-2 tablets every 8 hours prn pain    . Multiple Vitamin (MULTIVITAMIN WITH MINERALS) TABS tablet Take 1 tablet by mouth daily.    Derrill Memo ON 11/10/2016] oxymorphone (OPANA ER) 40 MG 12 hr tablet Take 40 mg by mouth.    . psyllium (HYDROCIL/METAMUCIL) 95 % PACK Take 2 packets by mouth 3 (three) times daily. 180 each 0  . vitamin C (ASCORBIC ACID) 500 MG tablet Take 500 mg by mouth daily.    Marland Kitchen oxyCODONE (OXY IR/ROXICODONE) 5 MG immediate release tablet Take 1-2 tablets (5-10 mg total) by mouth every 6 (six) hours as needed for moderate pain. (Patient not taking: Reported on 11/08/2016) 40 tablet 0  . paregoric 2 MG/5ML solution Take 5 mLs by mouth as needed for diarrhea or loose stools. (Patient not taking: Reported on 11/08/2016) 120 mL 0  . potassium & sodium phosphates (PHOS-NAK) 280-160-250 MG PACK Take 1 packet by mouth  4 (four) times daily. (Patient not taking: Reported on 11/08/2016) 120 packet 0   No current facility-administered medications for this visit.      REVIEW OF SYSTEMS:   Constitutional: Denies fevers, chills or night sweats Eyes: Denies blurriness of vision Ears, nose, mouth, throat, and face: Denies mucositis or sore throat Respiratory: Denies cough, dyspnea or wheezes Cardiovascular: Denies palpitation,  chest discomfort or lower extremity swelling Gastrointestinal:  Denies nausea, heartburn or change in bowel habits Skin: Denies abnormal skin rashes Lymphatics: Denies new lymphadenopathy or easy bruising Neurological:Denies numbness, tingling or new weaknesses Behavioral/Psych: Mood is stable, no new changes  All other systems were reviewed with the patient and are negative.  PHYSICAL EXAMINATION: ECOG PERFORMANCE STATUS: 1 - Symptomatic but completely ambulatory  Vitals:   11/08/16 1127  BP: 132/71  Pulse: 60  Resp: 18  Temp: 98.3 F (36.8 C)   Filed Weights   11/08/16 1127  Weight: 175 lb 8 oz (79.6 kg)    GENERAL:alert, no distress and comfortable SKIN: skin color, texture, turgor are normal, no rashes or significant lesions EYES: normal, Conjunctiva are pink and non-injected, sclera clear Musculoskeletal:no cyanosis of digits and no clubbing  NEURO: alert & oriented x 3 with fluent speech, no focal motor/sensory deficits  LABORATORY DATA:  I have reviewed the data as listed     Component Value Date/Time   NA 143 11/07/2016 1340   K 4.1 11/07/2016 1340   CL 106 06/12/2016 0544   CO2 30 (H) 11/07/2016 1340   GLUCOSE 87 11/07/2016 1340   BUN 3.2 (L) 11/07/2016 1340   CREATININE 1.0 11/07/2016 1340   CALCIUM 10.1 11/07/2016 1340   PROT 7.9 11/07/2016 1340   ALBUMIN 4.5 11/07/2016 1340   AST 31 11/07/2016 1340   ALT 30 11/07/2016 1340   ALKPHOS 98 11/07/2016 1340   BILITOT 0.45 11/07/2016 1340   GFRNONAA >60 06/12/2016 0544   GFRAA >60 06/12/2016 0544    No results found for: SPEP, UPEP  Lab Results  Component Value Date   WBC 6.7 11/07/2016   NEUTROABS 4.1 11/07/2016   HGB 12.5 (L) 11/07/2016   HCT 37.4 (L) 11/07/2016   MCV 90.3 11/07/2016   PLT 167 11/07/2016      Chemistry      Component Value Date/Time   NA 143 11/07/2016 1340   K 4.1 11/07/2016 1340   CL 106 06/12/2016 0544   CO2 30 (H) 11/07/2016 1340   BUN 3.2 (L) 11/07/2016 1340    CREATININE 1.0 11/07/2016 1340      Component Value Date/Time   CALCIUM 10.1 11/07/2016 1340   ALKPHOS 98 11/07/2016 1340   AST 31 11/07/2016 1340   ALT 30 11/07/2016 1340   BILITOT 0.45 11/07/2016 1340      ASSESSMENT & PLAN:  B12 deficiency anemia B12 deficiency had resolved I recommend spacing out B12 injection to every 3 months for the next year and I plan to recheck again next year  Iron deficiency anemia due to chronic blood loss Iron deficiency anemia has resolved We will recheck iron study next visit  Abnormal CT of the abdomen Repeat imaging study of the abdomen confirmed hemangioma, unchanged No further workup is necessary  Other fatigue He has chronic fatigue.  He sleeps poorly He has no energy I recommend he call his primary care doctor for further evaluation With improvement of his blood count, I would not expect him to be significantly tired   Orders Placed This Encounter  Procedures  .  Iron and TIBC    Standing Status:   Standing    Number of Occurrences:   2    Standing Expiration Date:   11/08/2017  . Ferritin    Standing Status:   Standing    Number of Occurrences:   2    Standing Expiration Date:   11/08/2017  . CBC with Differential/Platelet    Standing Status:   Standing    Number of Occurrences:   2    Standing Expiration Date:   11/08/2017  . Vitamin B12    Standing Status:   Standing    Number of Occurrences:   2    Standing Expiration Date:   11/08/2017    All questions were answered. The patient knows to call the clinic with any problems, questions or concerns. No barriers to learning was detected.  I spent 10 minutes counseling the patient face to face. The total time spent in the appointment was 15 minutes and more than 50% was on counseling.     Heath Lark, MD 7/26/20181:16 PM

## 2016-11-08 NOTE — Assessment & Plan Note (Signed)
He has chronic fatigue.  He sleeps poorly He has no energy I recommend he call his primary care doctor for further evaluation With improvement of his blood count, I would not expect him to be significantly tired

## 2016-11-08 NOTE — Assessment & Plan Note (Signed)
Iron deficiency anemia has resolved We will recheck iron study next visit

## 2016-11-08 NOTE — Patient Instructions (Signed)
Cyanocobalamin, Vitamin B12 injection What is this medicine? CYANOCOBALAMIN (sye an oh koe BAL a min) is a man made form of vitamin B12. Vitamin B12 is used in the growth of healthy blood cells, nerve cells, and proteins in the body. It also helps with the metabolism of fats and carbohydrates. This medicine is used to treat people who can not absorb vitamin B12. This medicine may be used for other purposes; ask your health care provider or pharmacist if you have questions. COMMON BRAND NAME(S): B-12 Compliance Kit, B-12 Injection Kit, Cyomin, LA-12, Nutri-Twelve, Physicians EZ Use B-12, Primabalt What should I tell my health care provider before I take this medicine? They need to know if you have any of these conditions: -kidney disease -Leber's disease -megaloblastic anemia -an unusual or allergic reaction to cyanocobalamin, cobalt, other medicines, foods, dyes, or preservatives -pregnant or trying to get pregnant -breast-feeding How should I use this medicine? This medicine is injected into a muscle or deeply under the skin. It is usually given by a health care professional in a clinic or doctor's office. However, your doctor may teach you how to inject yourself. Follow all instructions. Talk to your pediatrician regarding the use of this medicine in children. Special care may be needed. Overdosage: If you think you have taken too much of this medicine contact a poison control center or emergency room at once. NOTE: This medicine is only for you. Do not share this medicine with others. What if I miss a dose? If you are given your dose at a clinic or doctor's office, call to reschedule your appointment. If you give your own injections and you miss a dose, take it as soon as you can. If it is almost time for your next dose, take only that dose. Do not take double or extra doses. What may interact with this medicine? -colchicine -heavy alcohol intake This list may not describe all possible  interactions. Give your health care provider a list of all the medicines, herbs, non-prescription drugs, or dietary supplements you use. Also tell them if you smoke, drink alcohol, or use illegal drugs. Some items may interact with your medicine. What should I watch for while using this medicine? Visit your doctor or health care professional regularly. You may need blood work done while you are taking this medicine. You may need to follow a special diet. Talk to your doctor. Limit your alcohol intake and avoid smoking to get the best benefit. What side effects may I notice from receiving this medicine? Side effects that you should report to your doctor or health care professional as soon as possible: -allergic reactions like skin rash, itching or hives, swelling of the face, lips, or tongue -blue tint to skin -chest tightness, pain -difficulty breathing, wheezing -dizziness -red, swollen painful area on the leg Side effects that usually do not require medical attention (report to your doctor or health care professional if they continue or are bothersome): -diarrhea -headache This list may not describe all possible side effects. Call your doctor for medical advice about side effects. You may report side effects to FDA at 1-800-FDA-1088. Where should I keep my medicine? Keep out of the reach of children. Store at room temperature between 15 and 30 degrees C (59 and 85 degrees F). Protect from light. Throw away any unused medicine after the expiration date. NOTE: This sheet is a summary. It may not cover all possible information. If you have questions about this medicine, talk to your doctor, pharmacist, or   health care provider.  2018 Elsevier/Gold Standard (2007-07-14 22:10:20)  

## 2017-02-07 ENCOUNTER — Ambulatory Visit (HOSPITAL_BASED_OUTPATIENT_CLINIC_OR_DEPARTMENT_OTHER): Payer: Medicare Other

## 2017-02-07 VITALS — BP 139/83 | HR 71 | Temp 98.0°F | Resp 18

## 2017-02-07 DIAGNOSIS — D518 Other vitamin B12 deficiency anemias: Secondary | ICD-10-CM | POA: Diagnosis not present

## 2017-02-07 MED ORDER — CYANOCOBALAMIN 1000 MCG/ML IJ SOLN
1000.0000 ug | Freq: Once | INTRAMUSCULAR | Status: AC
  Administered 2017-02-07: 1000 ug via INTRAMUSCULAR

## 2017-02-07 NOTE — Patient Instructions (Signed)
Cyanocobalamin, Vitamin B12 injection What is this medicine? CYANOCOBALAMIN (sye an oh koe BAL a min) is a man made form of vitamin B12. Vitamin B12 is used in the growth of healthy blood cells, nerve cells, and proteins in the body. It also helps with the metabolism of fats and carbohydrates. This medicine is used to treat people who can not absorb vitamin B12. This medicine may be used for other purposes; ask your health care provider or pharmacist if you have questions. COMMON BRAND NAME(S): B-12 Compliance Kit, B-12 Injection Kit, Cyomin, LA-12, Nutri-Twelve, Physicians EZ Use B-12, Primabalt What should I tell my health care provider before I take this medicine? They need to know if you have any of these conditions: -kidney disease -Leber's disease -megaloblastic anemia -an unusual or allergic reaction to cyanocobalamin, cobalt, other medicines, foods, dyes, or preservatives -pregnant or trying to get pregnant -breast-feeding How should I use this medicine? This medicine is injected into a muscle or deeply under the skin. It is usually given by a health care professional in a clinic or doctor's office. However, your doctor may teach you how to inject yourself. Follow all instructions. Talk to your pediatrician regarding the use of this medicine in children. Special care may be needed. Overdosage: If you think you have taken too much of this medicine contact a poison control center or emergency room at once. NOTE: This medicine is only for you. Do not share this medicine with others. What if I miss a dose? If you are given your dose at a clinic or doctor's office, call to reschedule your appointment. If you give your own injections and you miss a dose, take it as soon as you can. If it is almost time for your next dose, take only that dose. Do not take double or extra doses. What may interact with this medicine? -colchicine -heavy alcohol intake This list may not describe all possible  interactions. Give your health care provider a list of all the medicines, herbs, non-prescription drugs, or dietary supplements you use. Also tell them if you smoke, drink alcohol, or use illegal drugs. Some items may interact with your medicine. What should I watch for while using this medicine? Visit your doctor or health care professional regularly. You may need blood work done while you are taking this medicine. You may need to follow a special diet. Talk to your doctor. Limit your alcohol intake and avoid smoking to get the best benefit. What side effects may I notice from receiving this medicine? Side effects that you should report to your doctor or health care professional as soon as possible: -allergic reactions like skin rash, itching or hives, swelling of the face, lips, or tongue -blue tint to skin -chest tightness, pain -difficulty breathing, wheezing -dizziness -red, swollen painful area on the leg Side effects that usually do not require medical attention (report to your doctor or health care professional if they continue or are bothersome): -diarrhea -headache This list may not describe all possible side effects. Call your doctor for medical advice about side effects. You may report side effects to FDA at 1-800-FDA-1088. Where should I keep my medicine? Keep out of the reach of children. Store at room temperature between 15 and 30 degrees C (59 and 85 degrees F). Protect from light. Throw away any unused medicine after the expiration date. NOTE: This sheet is a summary. It may not cover all possible information. If you have questions about this medicine, talk to your doctor, pharmacist, or   health care provider.  2018 Elsevier/Gold Standard (2007-07-14 22:10:20)  

## 2017-04-16 IMAGING — CT CT ABD-PELV W/ CM
2 of 5 series · 9 of 46 positions shown, 10 images · IV contrast (iopamidol)
Comparison: 02/08/2016 CT abdomen/ pelvis.

CLINICAL DATA: 49-year-old male inpatient with distal small bowel
perforation status post emergent ileoectomy 01/28/2016, complicated
by ileocolic anastomotic leak with extensive peritoneal cavity
abscess formation status post exploratory laparotomy, drainage of
the peritoneal abscesses, resection of ileocolonic anastomosis
02/08/2016, with resection of ischemic distal ileum and ileostomy
creation 02/10/2016, with closure of abdominal wall 02/12/2016, now
with worsening leukocytosis.

EXAM:
CT ABDOMEN AND PELVIS WITH CONTRAST
TECHNIQUE: Multidetector CT imaging of the abdomen and pelvis was performed
using the standard protocol following bolus administration of
intravenous contrast.
CONTRAST:  100mL [id number] IOPAMIDOL ([id number]) INJECTION 61%

[Series 201: routine, idose (2) · axial · 0.78mm/px · z∈[-414,-74]mm · 6 of 88 slices shown, 7 images]
[im 10/88  soft-tissue]
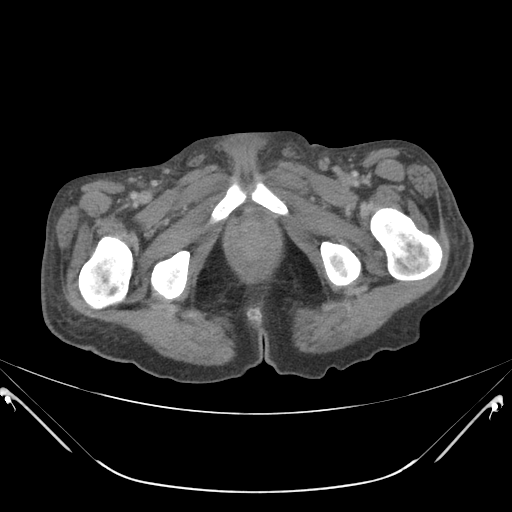
[im 10/88  bone]
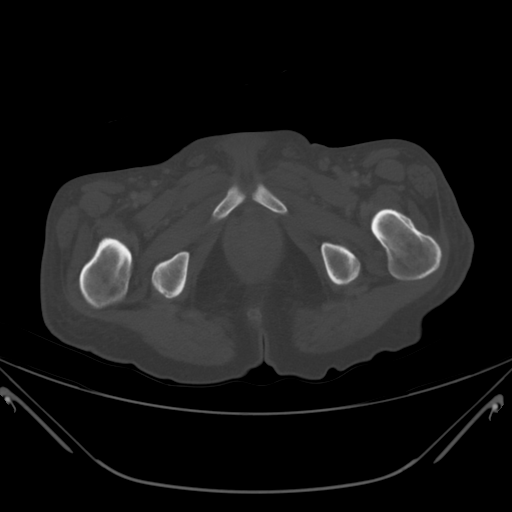
[im 25/88  soft-tissue]
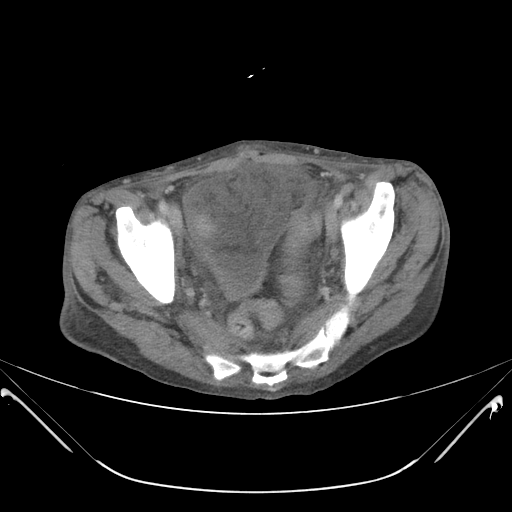
[im 39/88  soft-tissue]
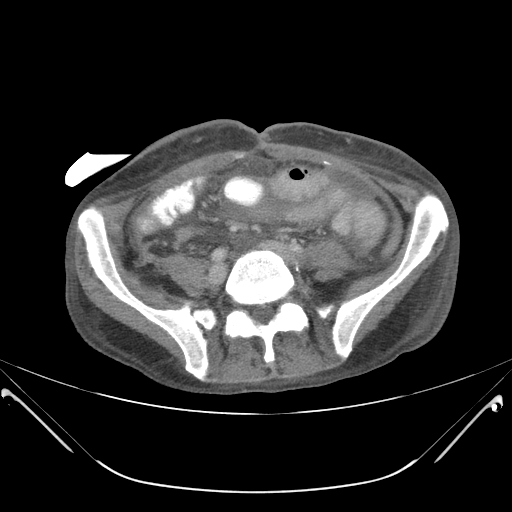
[im 49/88  soft-tissue]
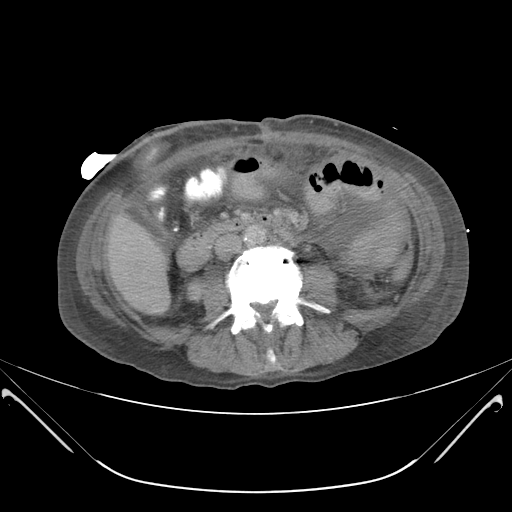
[im 63/88  soft-tissue]
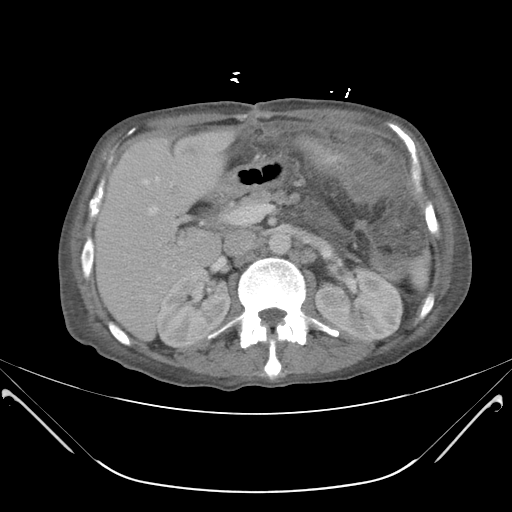
[im 78/88  soft-tissue]
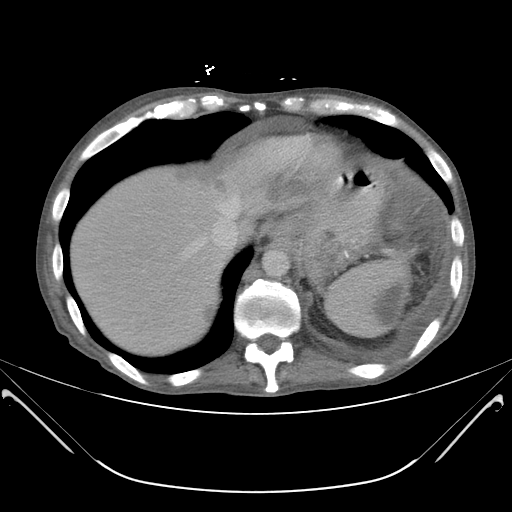

[Series 203: coronals, idose (2) · coronal · 0.45mm/px · 3 of 102 slices shown]
[im 34/102  soft-tissue]
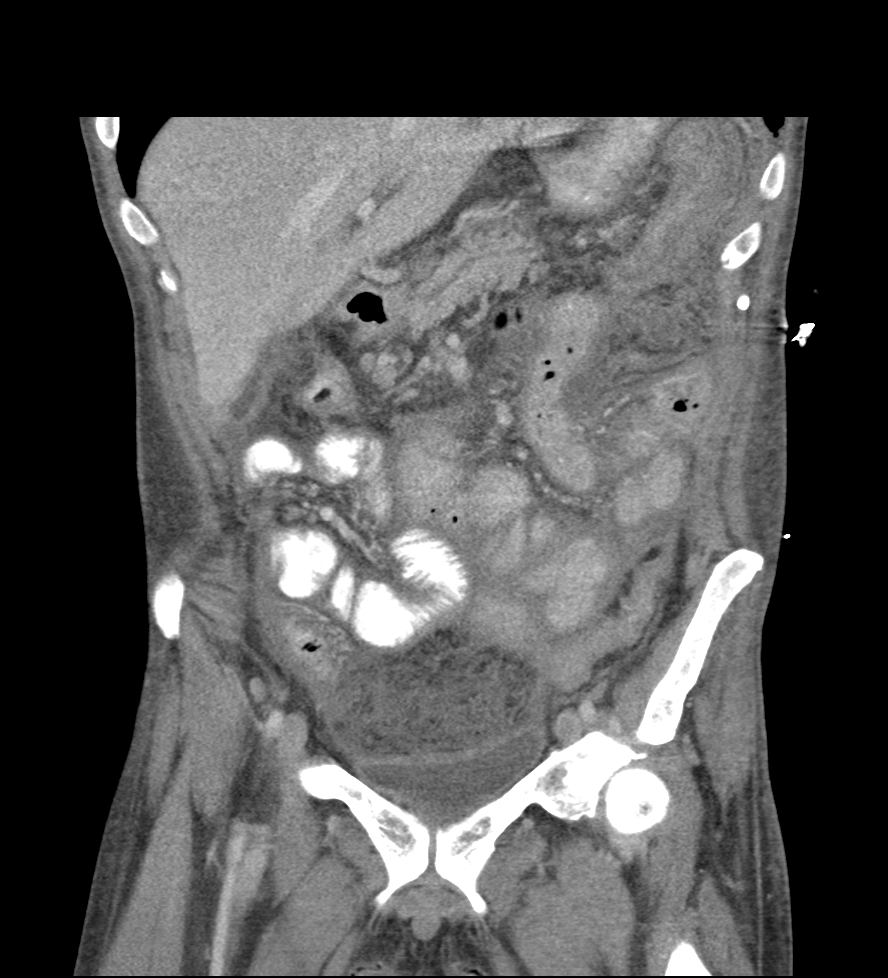
[im 45/102  soft-tissue]
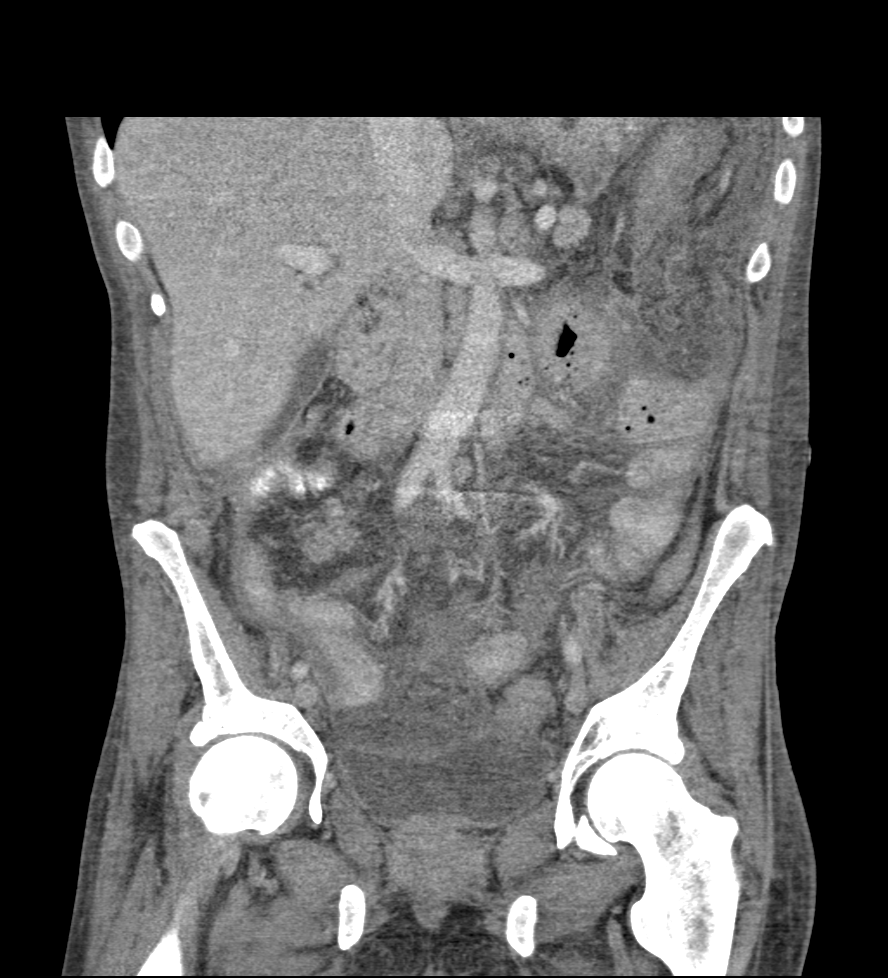
[im 57/102  soft-tissue]
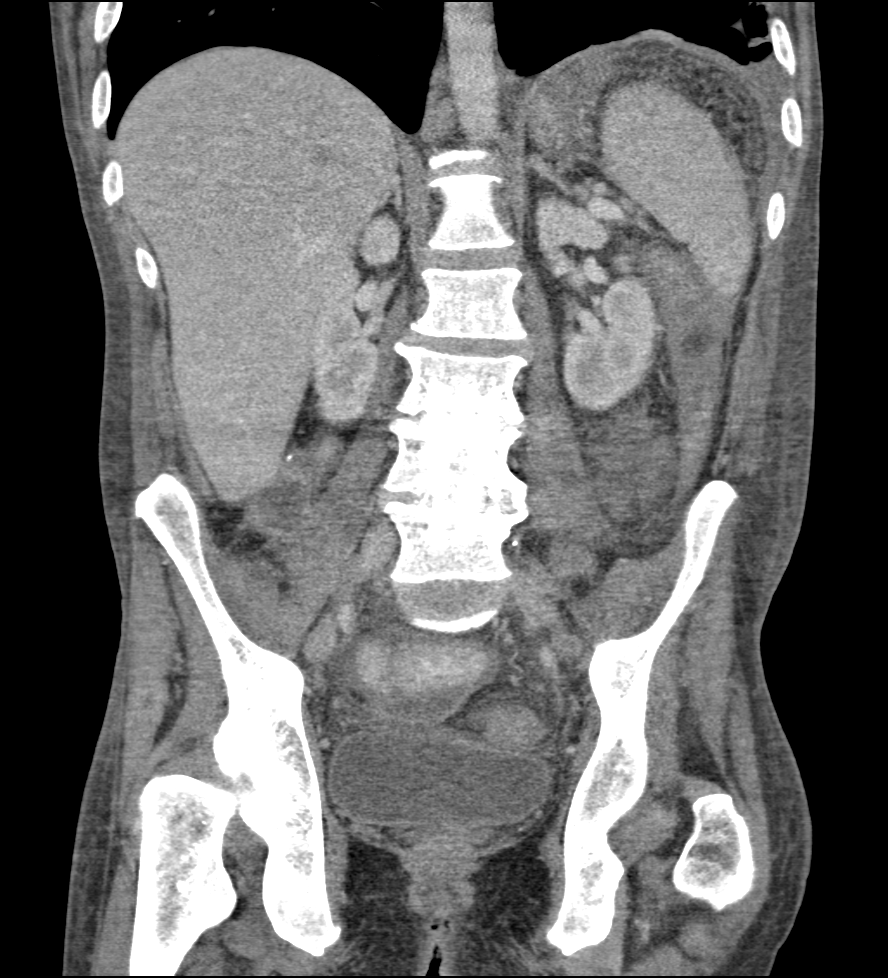

[9 of 46 positions shown; findings below may reference images not displayed]

FINDINGS: Lower chest: Small left pleural effusion. No right pleural effusion.
Trace pericardial effusion/ thickening, slightly increased. Patchy
bandlike consolidation in the peripheral basilar left lower lobe
with associated volume loss, mildly increased. Small subpleural bleb
in the basilar left lower lobe appears smaller.

Hepatobiliary: Normal liver size. Hypodense 2.1 x 2.1 cm segment 3
left liver lobe lesion (series 201/ image 24), previously 1.8 x
cm, mildly increased. At least four scattered subcapsular poorly
marginated low-attenuation liver lesions, largest 4.5 x 2.6 cm at
the left liver dome (series 201/ image 12), not appreciably changed.
Subcentimeter right liver lobe hypodense lesion is too small to
characterize and stable. No new liver lesions. Normal gallbladder
with no radiopaque cholelithiasis. No biliary ductal dilatation.

Pancreas: Normal, with no mass or duct dilation.

Spleen: Normal size spleen. New subcapsular 3.8 x 2.3 x 4.5 cm
collection in the peripheral upper spleen (series 201/image 10).

Adrenals/Urinary Tract: Normal adrenals. No hydronephrosis. Stable
mild scarring in the posterior upper right kidney versus
developmental junctional cortical defect. No renal masses. Normal
bladder.

Stomach/Bowel: Grossly normal stomach. Patient is status post
ileocecal and distal small bowel resection with ventral right
abdominal wall end ileostomy. Remnant small-bowel loops are
top-normal in caliber. Oral contrast progresses to the ileostomy
bag. There is mild diffuse small bowel wall thickening without
appreciable pneumatosis. The remnant proximally blind-ending
large-bowel is collapsed with diffuse large bowel wall thickening
and no appreciable pneumatosis.

Vascular/Lymphatic: Normal caliber abdominal aorta. Patent portal,
splenic, hepatic and renal veins. Stable mild aortocaval,
gastrohepatic ligament, paraceliac and left para-aortic
retroperitoneal lymphadenopathy, measuring up to the 1.3 cm in the
gastrohepatic ligament (series 201/ image 15), 1.1 cm in the
aortocaval chain (series 201/ image 27) and 1.3 cm in the left
para-aortic chain (series 201/ image 29). Stable mild right
mesenteric adenopathy measuring up to 1.1 cm (series 201/ image 33).
No new pathologically enlarged abdominopelvic nodes.

Reproductive: Normal size prostate.

Other: No pneumoperitoneum. Persistent fat stranding and ill-defined
fluid throughout the peritoneal cavity. Small 2.8 x 1.9 x 2.3 cm
thick walled fluid collection in the posterior right lower
peritoneal cavity abutting the right psoas muscle laterally (series
201/ image 47). Small 2.3 x 1.6 x 1.6 cm thick walled fluid
collection in the central pelvis between the sigmoid colon and
pelvic small bowel (series 201/ image 65). Midline laparotomy. No
superficial fluid collections.

Musculoskeletal: No aggressive appearing focal osseous lesions.
Moderate lumbar spondylosis. Stable postsurgical changes from
anterior spinal fusion at L3-4 and L4-5. Partially visualized spinal
fusion hardware in the left lower thoracic spine.
IMPRESSION: 1. Persistent diffuse peritonitis. Two small peritoneal abscesses as
described in the posterior right lower quadrant and central pelvis.
2. New small low attenuation subcapsular collection in the
peripheral upper spleen, possibly a subcapsular splenic abscess or
infarct.
3. Mild growth of a 2.1 cm low-attenuation segment 3 left liver lobe
mass, which could represent an hepatic abscess.
4. Additional low-attenuation liver lesions are stable and
indeterminate and warrant outpatient evaluation with MRI abdomen
without and with IV contrast when feasible.
5. No evidence of small bowel obstruction. Diffuse small and large
bowel wall thickening probably due to peritonitis, with no specific
findings of bowel ischemia (no pneumoperitoneum and no appreciable
pneumatosis).
6. Small left pleural effusion. Patchy bandlike consolidation and
volume loss in the peripheral basilar left lower lobe, favor
atelectasis.
7. Trace pericardial effusion/thickening, slightly increased.

## 2017-05-09 ENCOUNTER — Inpatient Hospital Stay: Payer: Medicare Other | Attending: Hematology and Oncology

## 2017-05-09 VITALS — BP 139/77 | HR 70 | Temp 98.3°F | Resp 20

## 2017-05-09 DIAGNOSIS — D500 Iron deficiency anemia secondary to blood loss (chronic): Secondary | ICD-10-CM

## 2017-05-09 DIAGNOSIS — D518 Other vitamin B12 deficiency anemias: Secondary | ICD-10-CM

## 2017-05-09 DIAGNOSIS — E538 Deficiency of other specified B group vitamins: Secondary | ICD-10-CM | POA: Insufficient documentation

## 2017-05-09 DIAGNOSIS — D509 Iron deficiency anemia, unspecified: Secondary | ICD-10-CM | POA: Diagnosis not present

## 2017-05-09 LAB — CBC WITH DIFFERENTIAL/PLATELET
Basophils Absolute: 0 10*3/uL (ref 0.0–0.1)
Basophils Relative: 1 %
EOS ABS: 0.4 10*3/uL (ref 0.0–0.5)
EOS PCT: 6 %
HCT: 37.7 % — ABNORMAL LOW (ref 38.4–49.9)
Hemoglobin: 12.6 g/dL — ABNORMAL LOW (ref 13.0–17.1)
LYMPHS ABS: 1.6 10*3/uL (ref 0.9–3.3)
LYMPHS PCT: 28 %
MCH: 29.7 pg (ref 27.2–33.4)
MCHC: 33.4 g/dL (ref 32.0–36.0)
MCV: 88.9 fL (ref 79.3–98.0)
MONO ABS: 0.7 10*3/uL (ref 0.1–0.9)
MONOS PCT: 11 %
Neutro Abs: 3.1 10*3/uL (ref 1.5–6.5)
Neutrophils Relative %: 54 %
PLATELETS: 167 10*3/uL (ref 140–400)
RBC: 4.24 MIL/uL (ref 4.20–5.82)
RDW: 12.4 % (ref 11.0–15.6)
WBC: 5.8 10*3/uL (ref 4.0–10.3)

## 2017-05-09 LAB — VITAMIN B12: Vitamin B-12: 299 pg/mL (ref 180–914)

## 2017-05-09 LAB — IRON AND TIBC
Iron: 121 ug/dL (ref 42–163)
Saturation Ratios: 50 % (ref 42–163)
TIBC: 241 ug/dL (ref 202–409)
UIBC: 121 ug/dL

## 2017-05-09 LAB — FERRITIN: FERRITIN: 485 ng/mL — AB (ref 22–316)

## 2017-05-09 MED ORDER — CYANOCOBALAMIN 1000 MCG/ML IJ SOLN
1000.0000 ug | Freq: Once | INTRAMUSCULAR | Status: AC
  Administered 2017-05-09: 1000 ug via INTRAMUSCULAR

## 2017-05-09 NOTE — Patient Instructions (Signed)
Cyanocobalamin, Vitamin B12 injection What is this medicine? CYANOCOBALAMIN (sye an oh koe BAL a min) is a man made form of vitamin B12. Vitamin B12 is used in the growth of healthy blood cells, nerve cells, and proteins in the body. It also helps with the metabolism of fats and carbohydrates. This medicine is used to treat people who can not absorb vitamin B12. This medicine may be used for other purposes; ask your health care provider or pharmacist if you have questions. COMMON BRAND NAME(S): B-12 Compliance Kit, B-12 Injection Kit, Cyomin, LA-12, Nutri-Twelve, Physicians EZ Use B-12, Primabalt What should I tell my health care provider before I take this medicine? They need to know if you have any of these conditions: -kidney disease -Leber's disease -megaloblastic anemia -an unusual or allergic reaction to cyanocobalamin, cobalt, other medicines, foods, dyes, or preservatives -pregnant or trying to get pregnant -breast-feeding How should I use this medicine? This medicine is injected into a muscle or deeply under the skin. It is usually given by a health care professional in a clinic or doctor's office. However, your doctor may teach you how to inject yourself. Follow all instructions. Talk to your pediatrician regarding the use of this medicine in children. Special care may be needed. Overdosage: If you think you have taken too much of this medicine contact a poison control center or emergency room at once. NOTE: This medicine is only for you. Do not share this medicine with others. What if I miss a dose? If you are given your dose at a clinic or doctor's office, call to reschedule your appointment. If you give your own injections and you miss a dose, take it as soon as you can. If it is almost time for your next dose, take only that dose. Do not take double or extra doses. What may interact with this medicine? -colchicine -heavy alcohol intake This list may not describe all possible  interactions. Give your health care provider a list of all the medicines, herbs, non-prescription drugs, or dietary supplements you use. Also tell them if you smoke, drink alcohol, or use illegal drugs. Some items may interact with your medicine. What should I watch for while using this medicine? Visit your doctor or health care professional regularly. You may need blood work done while you are taking this medicine. You may need to follow a special diet. Talk to your doctor. Limit your alcohol intake and avoid smoking to get the best benefit. What side effects may I notice from receiving this medicine? Side effects that you should report to your doctor or health care professional as soon as possible: -allergic reactions like skin rash, itching or hives, swelling of the face, lips, or tongue -blue tint to skin -chest tightness, pain -difficulty breathing, wheezing -dizziness -red, swollen painful area on the leg Side effects that usually do not require medical attention (report to your doctor or health care professional if they continue or are bothersome): -diarrhea -headache This list may not describe all possible side effects. Call your doctor for medical advice about side effects. You may report side effects to FDA at 1-800-FDA-1088. Where should I keep my medicine? Keep out of the reach of children. Store at room temperature between 15 and 30 degrees C (59 and 85 degrees F). Protect from light. Throw away any unused medicine after the expiration date. NOTE: This sheet is a summary. It may not cover all possible information. If you have questions about this medicine, talk to your doctor, pharmacist, or   health care provider.  2018 Elsevier/Gold Standard (2007-07-14 22:10:20)  

## 2017-08-08 ENCOUNTER — Inpatient Hospital Stay: Payer: Medicare Other | Attending: Hematology and Oncology

## 2017-08-08 VITALS — BP 112/71 | HR 62 | Temp 98.5°F | Resp 16

## 2017-08-08 DIAGNOSIS — D518 Other vitamin B12 deficiency anemias: Secondary | ICD-10-CM | POA: Diagnosis present

## 2017-08-08 MED ORDER — CYANOCOBALAMIN 1000 MCG/ML IJ SOLN
1000.0000 ug | Freq: Once | INTRAMUSCULAR | Status: AC
  Administered 2017-08-08: 1000 ug via INTRAMUSCULAR

## 2017-08-08 MED ORDER — CYANOCOBALAMIN 1000 MCG/ML IJ SOLN
INTRAMUSCULAR | Status: AC
  Filled 2017-08-08: qty 1

## 2017-08-08 NOTE — Patient Instructions (Signed)
Cyanocobalamin, Vitamin B12 injection What is this medicine? CYANOCOBALAMIN (sye an oh koe BAL a min) is a man made form of vitamin B12. Vitamin B12 is used in the growth of healthy blood cells, nerve cells, and proteins in the body. It also helps with the metabolism of fats and carbohydrates. This medicine is used to treat people who can not absorb vitamin B12. This medicine may be used for other purposes; ask your health care provider or pharmacist if you have questions. COMMON BRAND NAME(S): B-12 Compliance Kit, B-12 Injection Kit, Cyomin, LA-12, Nutri-Twelve, Physicians EZ Use B-12, Primabalt What should I tell my health care provider before I take this medicine? They need to know if you have any of these conditions: -kidney disease -Leber's disease -megaloblastic anemia -an unusual or allergic reaction to cyanocobalamin, cobalt, other medicines, foods, dyes, or preservatives -pregnant or trying to get pregnant -breast-feeding How should I use this medicine? This medicine is injected into a muscle or deeply under the skin. It is usually given by a health care professional in a clinic or doctor's office. However, your doctor may teach you how to inject yourself. Follow all instructions. Talk to your pediatrician regarding the use of this medicine in children. Special care may be needed. Overdosage: If you think you have taken too much of this medicine contact a poison control center or emergency room at once. NOTE: This medicine is only for you. Do not share this medicine with others. What if I miss a dose? If you are given your dose at a clinic or doctor's office, call to reschedule your appointment. If you give your own injections and you miss a dose, take it as soon as you can. If it is almost time for your next dose, take only that dose. Do not take double or extra doses. What may interact with this medicine? -colchicine -heavy alcohol intake This list may not describe all possible  interactions. Give your health care provider a list of all the medicines, herbs, non-prescription drugs, or dietary supplements you use. Also tell them if you smoke, drink alcohol, or use illegal drugs. Some items may interact with your medicine. What should I watch for while using this medicine? Visit your doctor or health care professional regularly. You may need blood work done while you are taking this medicine. You may need to follow a special diet. Talk to your doctor. Limit your alcohol intake and avoid smoking to get the best benefit. What side effects may I notice from receiving this medicine? Side effects that you should report to your doctor or health care professional as soon as possible: -allergic reactions like skin rash, itching or hives, swelling of the face, lips, or tongue -blue tint to skin -chest tightness, pain -difficulty breathing, wheezing -dizziness -red, swollen painful area on the leg Side effects that usually do not require medical attention (report to your doctor or health care professional if they continue or are bothersome): -diarrhea -headache This list may not describe all possible side effects. Call your doctor for medical advice about side effects. You may report side effects to FDA at 1-800-FDA-1088. Where should I keep my medicine? Keep out of the reach of children. Store at room temperature between 15 and 30 degrees C (59 and 85 degrees F). Protect from light. Throw away any unused medicine after the expiration date. NOTE: This sheet is a summary. It may not cover all possible information. If you have questions about this medicine, talk to your doctor, pharmacist, or   health care provider.  2018 Elsevier/Gold Standard (2007-07-14 22:10:20)  

## 2017-11-07 ENCOUNTER — Inpatient Hospital Stay (HOSPITAL_BASED_OUTPATIENT_CLINIC_OR_DEPARTMENT_OTHER): Payer: No Typology Code available for payment source | Admitting: Hematology and Oncology

## 2017-11-07 ENCOUNTER — Telehealth: Payer: Self-pay

## 2017-11-07 ENCOUNTER — Inpatient Hospital Stay: Payer: No Typology Code available for payment source | Attending: Hematology and Oncology

## 2017-11-07 ENCOUNTER — Inpatient Hospital Stay: Payer: No Typology Code available for payment source

## 2017-11-07 DIAGNOSIS — D500 Iron deficiency anemia secondary to blood loss (chronic): Secondary | ICD-10-CM | POA: Insufficient documentation

## 2017-11-07 DIAGNOSIS — Z79899 Other long term (current) drug therapy: Secondary | ICD-10-CM | POA: Diagnosis not present

## 2017-11-07 DIAGNOSIS — E538 Deficiency of other specified B group vitamins: Secondary | ICD-10-CM

## 2017-11-07 DIAGNOSIS — G8929 Other chronic pain: Secondary | ICD-10-CM | POA: Insufficient documentation

## 2017-11-07 DIAGNOSIS — R5382 Chronic fatigue, unspecified: Secondary | ICD-10-CM | POA: Diagnosis not present

## 2017-11-07 DIAGNOSIS — D518 Other vitamin B12 deficiency anemias: Secondary | ICD-10-CM

## 2017-11-07 DIAGNOSIS — R5383 Other fatigue: Secondary | ICD-10-CM

## 2017-11-07 LAB — CBC WITH DIFFERENTIAL/PLATELET
Basophils Absolute: 0 10*3/uL (ref 0.0–0.1)
Basophils Relative: 1 %
EOS ABS: 0.2 10*3/uL (ref 0.0–0.5)
EOS PCT: 4 %
HCT: 36.2 % — ABNORMAL LOW (ref 38.4–49.9)
HEMOGLOBIN: 12.2 g/dL — AB (ref 13.0–17.1)
LYMPHS ABS: 1.6 10*3/uL (ref 0.9–3.3)
Lymphocytes Relative: 27 %
MCH: 29.8 pg (ref 27.2–33.4)
MCHC: 33.7 g/dL (ref 32.0–36.0)
MCV: 88.3 fL (ref 79.3–98.0)
MONO ABS: 0.6 10*3/uL (ref 0.1–0.9)
MONOS PCT: 11 %
NEUTROS PCT: 57 %
Neutro Abs: 3.4 10*3/uL (ref 1.5–6.5)
PLATELETS: 182 10*3/uL (ref 140–400)
RBC: 4.1 MIL/uL — ABNORMAL LOW (ref 4.20–5.82)
RDW: 12.8 % (ref 11.0–14.6)
WBC: 5.9 10*3/uL (ref 4.0–10.3)

## 2017-11-07 LAB — IRON AND TIBC
IRON: 95 ug/dL (ref 42–163)
SATURATION RATIOS: 32 % — AB (ref 42–163)
TIBC: 293 ug/dL (ref 202–409)
UIBC: 198 ug/dL

## 2017-11-07 LAB — FERRITIN: Ferritin: 421 ng/mL — ABNORMAL HIGH (ref 24–336)

## 2017-11-07 LAB — VITAMIN B12: Vitamin B-12: 186 pg/mL (ref 180–914)

## 2017-11-07 MED ORDER — CYANOCOBALAMIN 1000 MCG/ML IJ SOLN
1000.0000 ug | Freq: Once | INTRAMUSCULAR | Status: AC
  Administered 2017-11-07: 1000 ug via INTRAMUSCULAR

## 2017-11-07 NOTE — Telephone Encounter (Signed)
-----   Message from Heath Lark, MD sent at 11/07/2017  3:33 PM EDT ----- Regarding: B12 level Pls call him. B12 level is still borderline I plan to schedule B12 level every month for 6 months Scheduler will call him

## 2017-11-07 NOTE — Telephone Encounter (Signed)
Called and given below message. Verbalized understanding. 

## 2017-11-07 NOTE — Patient Instructions (Signed)
Cyanocobalamin, Vitamin B12 injection What is this medicine? CYANOCOBALAMIN (sye an oh koe BAL a min) is a man made form of vitamin B12. Vitamin B12 is used in the growth of healthy blood cells, nerve cells, and proteins in the body. It also helps with the metabolism of fats and carbohydrates. This medicine is used to treat people who can not absorb vitamin B12. This medicine may be used for other purposes; ask your health care provider or pharmacist if you have questions. COMMON BRAND NAME(S): B-12 Compliance Kit, B-12 Injection Kit, Cyomin, LA-12, Nutri-Twelve, Physicians EZ Use B-12, Primabalt What should I tell my health care provider before I take this medicine? They need to know if you have any of these conditions: -kidney disease -Leber's disease -megaloblastic anemia -an unusual or allergic reaction to cyanocobalamin, cobalt, other medicines, foods, dyes, or preservatives -pregnant or trying to get pregnant -breast-feeding How should I use this medicine? This medicine is injected into a muscle or deeply under the skin. It is usually given by a health care professional in a clinic or doctor's office. However, your doctor may teach you how to inject yourself. Follow all instructions. Talk to your pediatrician regarding the use of this medicine in children. Special care may be needed. Overdosage: If you think you have taken too much of this medicine contact a poison control center or emergency room at once. NOTE: This medicine is only for you. Do not share this medicine with others. What if I miss a dose? If you are given your dose at a clinic or doctor's office, call to reschedule your appointment. If you give your own injections and you miss a dose, take it as soon as you can. If it is almost time for your next dose, take only that dose. Do not take double or extra doses. What may interact with this medicine? -colchicine -heavy alcohol intake This list may not describe all possible  interactions. Give your health care provider a list of all the medicines, herbs, non-prescription drugs, or dietary supplements you use. Also tell them if you smoke, drink alcohol, or use illegal drugs. Some items may interact with your medicine. What should I watch for while using this medicine? Visit your doctor or health care professional regularly. You may need blood work done while you are taking this medicine. You may need to follow a special diet. Talk to your doctor. Limit your alcohol intake and avoid smoking to get the best benefit. What side effects may I notice from receiving this medicine? Side effects that you should report to your doctor or health care professional as soon as possible: -allergic reactions like skin rash, itching or hives, swelling of the face, lips, or tongue -blue tint to skin -chest tightness, pain -difficulty breathing, wheezing -dizziness -red, swollen painful area on the leg Side effects that usually do not require medical attention (report to your doctor or health care professional if they continue or are bothersome): -diarrhea -headache This list may not describe all possible side effects. Call your doctor for medical advice about side effects. You may report side effects to FDA at 1-800-FDA-1088. Where should I keep my medicine? Keep out of the reach of children. Store at room temperature between 15 and 30 degrees C (59 and 85 degrees F). Protect from light. Throw away any unused medicine after the expiration date. NOTE: This sheet is a summary. It may not cover all possible information. If you have questions about this medicine, talk to your doctor, pharmacist, or   health care provider.  2018 Elsevier/Gold Standard (2007-07-14 22:10:20)  

## 2017-11-08 ENCOUNTER — Telehealth: Payer: Self-pay | Admitting: Hematology and Oncology

## 2017-11-08 ENCOUNTER — Encounter: Payer: Self-pay | Admitting: Hematology and Oncology

## 2017-11-08 NOTE — Telephone Encounter (Signed)
Per 7/25 los/sch msg, called patient and left message with next injection appt on 8/22 @ 12:30 pm.  Mailed calendar, as well, with additional appointments.

## 2017-11-08 NOTE — Progress Notes (Signed)
[facility] OFFICE PROGRESS NOTE  Tamsen Roers, MD  ASSESSMENT & PLAN:  B12 deficiency anemia The patient has borderline B12 deficiency I recommend resumption of monthly vitamin B12 injection and recheck in 6 months  Iron deficiency anemia due to chronic blood loss I have review his iron studies He does not need to resume intravenous iron infusion for now I plan to recheck in his next visit in 6 months  Other fatigue The patient has been complaining of chronic fatigue I do not believe the mild anemia and vitamin B12 deficiency other causes of his chronic fatigue We discussed briefly about potential causes of fatigue, medication side effects and possibly undiagnosed obstructive sleep apnea He did admit he sleeps poorly I would defer to his primary care doctor for further management   Orders Placed This Encounter  Procedures  . Ferritin    Standing Status:   Future    Standing Expiration Date:   11/08/2018  . Iron and TIBC    Standing Status:   Future    Standing Expiration Date:   12/13/2018  . Vitamin B12    Standing Status:   Future    Standing Expiration Date:   12/13/2018  . CBC with Differential/Platelet    Standing Status:   Future    Standing Expiration Date:   12/13/2018    INTERVAL HISTORY: [name] 52 y.o. male returns for further follow-up for iron deficiency anemia and vitamin B12 deficiency He continues to complain of chronic fatigue He sleeps poorly The patient denies any recent signs or symptoms of bleeding such as spontaneous epistaxis, hematuria or hematochezia.   SUMMARY OF HEMATOLOGIC HISTORY:  [name] is here because of recent diagnosis of severe anemia He was hospitalized in February 2018 with presentation of severe electrolyte abnormalities [name] history of small bowel perforation S/P ileocecectomy on October 2017 who required subsequent admission for septic shock secondary to ischemic bowel (with perforation at  his anastomosis site causing intra-abdominal abscesses). 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 He also has a history of chronic pain with narcotic dependence and recurrent electrolyte abnormalities. He was also found to be profoundly anemic with hemoglobin of 6.4 He received blood transfusion and iron infusion prior to hospital discharge On review of his electronic records, he was found to have profound B12 deficiency.  He was started on vitamin B12 replacement therapy.  He was also noted to have abnormal CT scan with liver lesions.  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 He has chronic back pain.  His pain is managed by pain clinic  I have reviewed the past medical history, past surgical history, social history and family history with the patient and they are unchanged from previous note.  ALLERGIES:  is allergic to celecoxib; cyclobenzaprine; dexamethasone; diazepam; etodolac; gabapentin; metaxalone; methocarbamol; pregabalin; propoxyphene; tape; tramadol; clonazepam; and latex.  MEDICATIONS:  Current Outpatient Medications  Medication Sig Dispense Refill  . acetaminophen (TYLENOL) 325 MG tablet Take 325-650 mg by mouth every 6 (six) hours as needed for headache.     . albuterol (PROVENTIL HFA;VENTOLIN HFA) 108 (90 Base) MCG/ACT inhaler Inhale 2 puffs into the lungs every 6 (six) hours as needed for wheezing or shortness of breath.    . ferrous sulfate 325 (65 FE) MG tablet Take 1 tablet (325 mg total) by mouth 3 (three) times daily with meals. 90 tablet 0  . loperamide (IMODIUM) 2 MG capsule Take 2 capsules (4 mg total) by mouth  4 (four) times daily. 30 capsule 0  . morphine (MSIR) 15 MG tablet 60 mg every 12 hours prn    . Multiple Vitamin (MULTIVITAMIN WITH MINERALS) TABS tablet Take 1 tablet by mouth daily.    Marland Kitchen oxyCODONE (OXY IR/ROXICODONE) 5 MG immediate release tablet Take 1-2 tablets (5-10 mg total) by mouth every 6 (six) hours as needed for moderate pain. (Patient taking  differently: Take 10 mg by mouth every 6 (six) hours as needed for moderate pain. ) 40 tablet 0  . paregoric 2 MG/5ML solution Take 5 mLs by mouth as needed for diarrhea or loose stools. (Patient not taking: Reported on 11/08/2016) 120 mL 0  . potassium & sodium phosphates (PHOS-NAK) 280-160-250 MG PACK Take 1 packet by mouth 4 (four) times daily. (Patient not taking: Reported on 11/08/2016) 120 packet 0  . psyllium (HYDROCIL/METAMUCIL) 95 % PACK Take 2 packets by mouth 3 (three) times daily. 180 each 0  . vitamin C (ASCORBIC ACID) 500 MG tablet Take 500 mg by mouth daily.     No current facility-administered medications for this visit.      REVIEW OF SYSTEMS:   Constitutional: Denies fevers, chills or night sweats Eyes: Denies blurriness of vision Ears, nose, mouth, throat, and face: Denies mucositis or sore throat Respiratory: Denies cough, dyspnea or wheezes Cardiovascular: Denies palpitation, chest discomfort or lower extremity swelling Gastrointestinal:  Denies nausea, heartburn or change in bowel habits Skin: Denies abnormal skin rashes Lymphatics: Denies new lymphadenopathy or easy bruising Neurological:Denies numbness, tingling or new weaknesses Behavioral/Psych: Mood is stable, no new changes  All other systems were reviewed with the patient and are negative.  PHYSICAL EXAMINATION: ECOG PERFORMANCE STATUS: 1 - Symptomatic but completely ambulatory  Vitals:   11/07/17 1406  BP: 108/82  Pulse: 62  Resp: 18  Temp: 98.6 F (37 C)  SpO2: 100%   Filed Weights   11/07/17 1406  Weight: 165 lb 4.8 oz (75 kg)    GENERAL:alert, no distress and comfortable SKIN: skin color, texture, turgor are normal, no rashes or significant lesions EYES: normal, Conjunctiva are pink and non-injected, sclera clear OROPHARYNX:no exudate, no erythema and lips, buccal mucosa, and tongue normal  NECK: supple, thyroid normal size, non-tender, without nodularity LYMPH:  no palpable lymphadenopathy  in the cervical, axillary or inguinal LUNGS: clear to auscultation and percussion with normal breathing effort HEART: regular rate & rhythm and no murmurs and no lower extremity edema ABDOMEN:abdomen soft, non-tender and normal bowel sounds Musculoskeletal:no cyanosis of digits and no clubbing  NEURO: alert & oriented x 3 with fluent speech, no focal motor/sensory deficits  LABORATORY DATA:  I have reviewed the data as listed     Component Value Date/Time   NA 143 11/07/2016 1340   K 4.1 11/07/2016 1340   CL 106 06/12/2016 0544   CO2 30 (H) 11/07/2016 1340   GLUCOSE 87 11/07/2016 1340   BUN 3.2 (L) 11/07/2016 1340   CREATININE 1.0 11/07/2016 1340   CALCIUM 10.1 11/07/2016 1340   PROT 7.9 11/07/2016 1340   ALBUMIN 4.5 11/07/2016 1340   AST 31 11/07/2016 1340   ALT 30 11/07/2016 1340   ALKPHOS 98 11/07/2016 1340   BILITOT 0.45 11/07/2016 1340   GFRNONAA >60 06/12/2016 0544   GFRAA >60 06/12/2016 0544    No results found for: SPEP, UPEP  Lab Results  Component Value Date   WBC 5.9 11/07/2017   NEUTROABS 3.4 11/07/2017   HGB 12.2 (L) 11/07/2017   HCT 36.2 (L)  11/07/2017   MCV 88.3 11/07/2017   PLT 182 11/07/2017      Chemistry      Component Value Date/Time   NA 143 11/07/2016 1340   K 4.1 11/07/2016 1340   CL 106 06/12/2016 0544   CO2 30 (H) 11/07/2016 1340   BUN 3.2 (L) 11/07/2016 1340   CREATININE 1.0 11/07/2016 1340      Component Value Date/Time   CALCIUM 10.1 11/07/2016 1340   ALKPHOS 98 11/07/2016 1340   AST 31 11/07/2016 1340   ALT 30 11/07/2016 1340   BILITOT 0.45 11/07/2016 1340      I spent 15 minutes counseling the patient face to face. The total time spent in the appointment was 20 minutes and more than 50% was on counseling.   All questions were answered. The patient knows to call the clinic with any problems, questions or concerns. No barriers to learning was detected.    Heath Lark, MD 7/26/20199:16 AM

## 2017-11-08 NOTE — Assessment & Plan Note (Signed)
The patient has been complaining of chronic fatigue I do not believe the mild anemia and vitamin B12 deficiency other causes of his chronic fatigue We discussed briefly about potential causes of fatigue, medication side effects and possibly undiagnosed obstructive sleep apnea He did admit he sleeps poorly I would defer to his primary care doctor for further management

## 2017-11-08 NOTE — Telephone Encounter (Signed)
Per 7/25 los/sch msg, called patient with next appt and sent calendar for appts.

## 2017-11-08 NOTE — Assessment & Plan Note (Signed)
I have review his iron studies He does not need to resume intravenous iron infusion for now I plan to recheck in his next visit in 6 months

## 2017-11-08 NOTE — Assessment & Plan Note (Signed)
The patient has borderline B12 deficiency I recommend resumption of monthly vitamin B12 injection and recheck in 6 months

## 2017-12-05 ENCOUNTER — Inpatient Hospital Stay: Payer: Medicare Other | Attending: Hematology and Oncology

## 2017-12-05 VITALS — BP 113/66 | HR 59 | Temp 98.4°F | Resp 18

## 2017-12-05 DIAGNOSIS — D518 Other vitamin B12 deficiency anemias: Secondary | ICD-10-CM | POA: Diagnosis present

## 2017-12-05 MED ORDER — CYANOCOBALAMIN 1000 MCG/ML IJ SOLN
1000.0000 ug | Freq: Once | INTRAMUSCULAR | Status: AC
  Administered 2017-12-05: 1000 ug via INTRAMUSCULAR

## 2017-12-05 NOTE — Patient Instructions (Signed)

## 2017-12-05 NOTE — Progress Notes (Signed)
Pt. Tolerated injection well, No further problems or concerns noted. 

## 2018-01-02 ENCOUNTER — Inpatient Hospital Stay: Payer: Medicare Other | Attending: Hematology and Oncology

## 2018-01-02 VITALS — BP 133/74 | HR 88 | Temp 98.4°F | Resp 20

## 2018-01-02 DIAGNOSIS — D518 Other vitamin B12 deficiency anemias: Secondary | ICD-10-CM

## 2018-01-02 DIAGNOSIS — E538 Deficiency of other specified B group vitamins: Secondary | ICD-10-CM | POA: Insufficient documentation

## 2018-01-02 MED ORDER — CYANOCOBALAMIN 1000 MCG/ML IJ SOLN
1000.0000 ug | Freq: Once | INTRAMUSCULAR | Status: AC
  Administered 2018-01-02: 1000 ug via INTRAMUSCULAR

## 2018-01-02 MED ORDER — CYANOCOBALAMIN 1000 MCG/ML IJ SOLN
INTRAMUSCULAR | Status: AC
  Filled 2018-01-02: qty 1

## 2018-01-02 NOTE — Patient Instructions (Signed)

## 2018-01-30 ENCOUNTER — Inpatient Hospital Stay: Payer: Medicare Other | Attending: Hematology and Oncology

## 2018-01-30 DIAGNOSIS — D518 Other vitamin B12 deficiency anemias: Secondary | ICD-10-CM | POA: Diagnosis present

## 2018-01-30 MED ORDER — CYANOCOBALAMIN 1000 MCG/ML IJ SOLN
1000.0000 ug | Freq: Once | INTRAMUSCULAR | Status: AC
  Administered 2018-01-30: 1000 ug via INTRAMUSCULAR

## 2018-01-30 MED ORDER — CYANOCOBALAMIN 1000 MCG/ML IJ SOLN
INTRAMUSCULAR | Status: AC
  Filled 2018-01-30: qty 1

## 2018-01-30 NOTE — Patient Instructions (Signed)
Cyanocobalamin, Vitamin B12 injection What is this medicine? CYANOCOBALAMIN (sye an oh koe BAL a min) is a man made form of vitamin B12. Vitamin B12 is used in the growth of healthy blood cells, nerve cells, and proteins in the body. It also helps with the metabolism of fats and carbohydrates. This medicine is used to treat people who can not absorb vitamin B12. This medicine may be used for other purposes; ask your health care provider or pharmacist if you have questions. COMMON BRAND NAME(S): B-12 Compliance Kit, B-12 Injection Kit, Cyomin, LA-12, Nutri-Twelve, Physicians EZ Use B-12, Primabalt What should I tell my health care provider before I take this medicine? They need to know if you have any of these conditions: -kidney disease -Leber's disease -megaloblastic anemia -an unusual or allergic reaction to cyanocobalamin, cobalt, other medicines, foods, dyes, or preservatives -pregnant or trying to get pregnant -breast-feeding How should I use this medicine? This medicine is injected into a muscle or deeply under the skin. It is usually given by a health care professional in a clinic or doctor's office. However, your doctor may teach you how to inject yourself. Follow all instructions. Talk to your pediatrician regarding the use of this medicine in children. Special care may be needed. Overdosage: If you think you have taken too much of this medicine contact a poison control center or emergency room at once. NOTE: This medicine is only for you. Do not share this medicine with others. What if I miss a dose? If you are given your dose at a clinic or doctor's office, call to reschedule your appointment. If you give your own injections and you miss a dose, take it as soon as you can. If it is almost time for your next dose, take only that dose. Do not take double or extra doses. What may interact with this medicine? -colchicine -heavy alcohol intake This list may not describe all possible  interactions. Give your health care provider a list of all the medicines, herbs, non-prescription drugs, or dietary supplements you use. Also tell them if you smoke, drink alcohol, or use illegal drugs. Some items may interact with your medicine. What should I watch for while using this medicine? Visit your doctor or health care professional regularly. You may need blood work done while you are taking this medicine. You may need to follow a special diet. Talk to your doctor. Limit your alcohol intake and avoid smoking to get the best benefit. What side effects may I notice from receiving this medicine? Side effects that you should report to your doctor or health care professional as soon as possible: -allergic reactions like skin rash, itching or hives, swelling of the face, lips, or tongue -blue tint to skin -chest tightness, pain -difficulty breathing, wheezing -dizziness -red, swollen painful area on the leg Side effects that usually do not require medical attention (report to your doctor or health care professional if they continue or are bothersome): -diarrhea -headache This list may not describe all possible side effects. Call your doctor for medical advice about side effects. You may report side effects to FDA at 1-800-FDA-1088. Where should I keep my medicine? Keep out of the reach of children. Store at room temperature between 15 and 30 degrees C (59 and 85 degrees F). Protect from light. Throw away any unused medicine after the expiration date. NOTE: This sheet is a summary. It may not cover all possible information. If you have questions about this medicine, talk to your doctor, pharmacist, or   health care provider.  2018 Elsevier/Gold Standard (2007-07-14 22:10:20)  

## 2018-02-27 ENCOUNTER — Inpatient Hospital Stay: Payer: Medicare Other | Attending: Hematology and Oncology

## 2018-02-27 VITALS — BP 123/57 | HR 72 | Temp 98.3°F | Resp 18

## 2018-02-27 DIAGNOSIS — E538 Deficiency of other specified B group vitamins: Secondary | ICD-10-CM | POA: Insufficient documentation

## 2018-02-27 DIAGNOSIS — D518 Other vitamin B12 deficiency anemias: Secondary | ICD-10-CM

## 2018-02-27 MED ORDER — CYANOCOBALAMIN 1000 MCG/ML IJ SOLN
INTRAMUSCULAR | Status: AC
  Filled 2018-02-27: qty 1

## 2018-02-27 MED ORDER — CYANOCOBALAMIN 1000 MCG/ML IJ SOLN
1000.0000 ug | Freq: Once | INTRAMUSCULAR | Status: AC
  Administered 2018-02-27: 1000 ug via INTRAMUSCULAR

## 2018-03-27 ENCOUNTER — Inpatient Hospital Stay: Payer: Medicare Other | Attending: Hematology and Oncology

## 2018-03-27 DIAGNOSIS — E538 Deficiency of other specified B group vitamins: Secondary | ICD-10-CM | POA: Diagnosis present

## 2018-03-27 DIAGNOSIS — D518 Other vitamin B12 deficiency anemias: Secondary | ICD-10-CM

## 2018-03-27 MED ORDER — CYANOCOBALAMIN 1000 MCG/ML IJ SOLN
1000.0000 ug | Freq: Once | INTRAMUSCULAR | Status: AC
  Administered 2018-03-27: 1000 ug via INTRAMUSCULAR

## 2018-03-27 NOTE — Patient Instructions (Signed)
Cyanocobalamin, Vitamin B12 injection What is this medicine? CYANOCOBALAMIN (sye an oh koe BAL a min) is a man made form of vitamin B12. Vitamin B12 is used in the growth of healthy blood cells, nerve cells, and proteins in the body. It also helps with the metabolism of fats and carbohydrates. This medicine is used to treat people who can not absorb vitamin B12. This medicine may be used for other purposes; ask your health care provider or pharmacist if you have questions. COMMON BRAND NAME(S): B-12 Compliance Kit, B-12 Injection Kit, Cyomin, LA-12, Nutri-Twelve, Physicians EZ Use B-12, Primabalt What should I tell my health care provider before I take this medicine? They need to know if you have any of these conditions: -kidney disease -Leber's disease -megaloblastic anemia -an unusual or allergic reaction to cyanocobalamin, cobalt, other medicines, foods, dyes, or preservatives -pregnant or trying to get pregnant -breast-feeding How should I use this medicine? This medicine is injected into a muscle or deeply under the skin. It is usually given by a health care professional in a clinic or doctor's office. However, your doctor may teach you how to inject yourself. Follow all instructions. Talk to your pediatrician regarding the use of this medicine in children. Special care may be needed. Overdosage: If you think you have taken too much of this medicine contact a poison control center or emergency room at once. NOTE: This medicine is only for you. Do not share this medicine with others. What if I miss a dose? If you are given your dose at a clinic or doctor's office, call to reschedule your appointment. If you give your own injections and you miss a dose, take it as soon as you can. If it is almost time for your next dose, take only that dose. Do not take double or extra doses. What may interact with this medicine? -colchicine -heavy alcohol intake This list may not describe all possible  interactions. Give your health care provider a list of all the medicines, herbs, non-prescription drugs, or dietary supplements you use. Also tell them if you smoke, drink alcohol, or use illegal drugs. Some items may interact with your medicine. What should I watch for while using this medicine? Visit your doctor or health care professional regularly. You may need blood work done while you are taking this medicine. You may need to follow a special diet. Talk to your doctor. Limit your alcohol intake and avoid smoking to get the best benefit. What side effects may I notice from receiving this medicine? Side effects that you should report to your doctor or health care professional as soon as possible: -allergic reactions like skin rash, itching or hives, swelling of the face, lips, or tongue -blue tint to skin -chest tightness, pain -difficulty breathing, wheezing -dizziness -red, swollen painful area on the leg Side effects that usually do not require medical attention (report to your doctor or health care professional if they continue or are bothersome): -diarrhea -headache This list may not describe all possible side effects. Call your doctor for medical advice about side effects. You may report side effects to FDA at 1-800-FDA-1088. Where should I keep my medicine? Keep out of the reach of children. Store at room temperature between 15 and 30 degrees C (59 and 85 degrees F). Protect from light. Throw away any unused medicine after the expiration date. NOTE: This sheet is a summary. It may not cover all possible information. If you have questions about this medicine, talk to your doctor, pharmacist, or   health care provider.  2018 Elsevier/Gold Standard (2007-07-14 22:10:20)  

## 2018-04-23 ENCOUNTER — Telehealth: Payer: Self-pay

## 2018-04-23 NOTE — Telephone Encounter (Signed)
[name] called back. Scheduling message sent.

## 2018-04-23 NOTE — Telephone Encounter (Signed)
Called and left a message asking him if his appt for 1/9 can earlier. Ask him to call back.

## 2018-04-24 ENCOUNTER — Inpatient Hospital Stay: Payer: Medicare Other

## 2018-04-24 ENCOUNTER — Inpatient Hospital Stay: Payer: Medicare Other | Attending: Hematology and Oncology | Admitting: Hematology and Oncology

## 2018-04-24 ENCOUNTER — Inpatient Hospital Stay: Payer: Medicare Other | Admitting: Hematology and Oncology

## 2018-04-24 ENCOUNTER — Ambulatory Visit: Payer: Medicare Other

## 2018-04-24 DIAGNOSIS — D518 Other vitamin B12 deficiency anemias: Secondary | ICD-10-CM

## 2018-04-24 DIAGNOSIS — G8929 Other chronic pain: Secondary | ICD-10-CM | POA: Diagnosis not present

## 2018-04-24 DIAGNOSIS — E538 Deficiency of other specified B group vitamins: Secondary | ICD-10-CM | POA: Insufficient documentation

## 2018-04-24 DIAGNOSIS — Z79899 Other long term (current) drug therapy: Secondary | ICD-10-CM | POA: Diagnosis not present

## 2018-04-24 DIAGNOSIS — D500 Iron deficiency anemia secondary to blood loss (chronic): Secondary | ICD-10-CM | POA: Diagnosis not present

## 2018-04-24 DIAGNOSIS — Z23 Encounter for immunization: Secondary | ICD-10-CM | POA: Insufficient documentation

## 2018-04-24 DIAGNOSIS — M549 Dorsalgia, unspecified: Secondary | ICD-10-CM | POA: Insufficient documentation

## 2018-04-24 LAB — IRON AND TIBC
IRON: 70 ug/dL (ref 42–163)
Saturation Ratios: 25 % (ref 20–55)
TIBC: 285 ug/dL (ref 202–409)
UIBC: 215 ug/dL (ref 117–376)

## 2018-04-24 LAB — CBC WITH DIFFERENTIAL/PLATELET
Abs Immature Granulocytes: 0.02 10*3/uL (ref 0.00–0.07)
BASOS ABS: 0 10*3/uL (ref 0.0–0.1)
BASOS PCT: 0 %
EOS ABS: 0.3 10*3/uL (ref 0.0–0.5)
EOS PCT: 5 %
HEMATOCRIT: 35 % — AB (ref 39.0–52.0)
Hemoglobin: 11.5 g/dL — ABNORMAL LOW (ref 13.0–17.0)
IMMATURE GRANULOCYTES: 0 %
Lymphocytes Relative: 27 %
Lymphs Abs: 1.9 10*3/uL (ref 0.7–4.0)
MCH: 29.6 pg (ref 26.0–34.0)
MCHC: 32.9 g/dL (ref 30.0–36.0)
MCV: 90.2 fL (ref 80.0–100.0)
Monocytes Absolute: 0.8 10*3/uL (ref 0.1–1.0)
Monocytes Relative: 12 %
NEUTROS PCT: 56 %
NRBC: 0 % (ref 0.0–0.2)
Neutro Abs: 4.1 10*3/uL (ref 1.7–7.7)
PLATELETS: 159 10*3/uL (ref 150–400)
RBC: 3.88 MIL/uL — AB (ref 4.22–5.81)
RDW: 12.9 % (ref 11.5–15.5)
WBC: 7.3 10*3/uL (ref 4.0–10.5)

## 2018-04-24 LAB — FERRITIN: FERRITIN: 347 ng/mL — AB (ref 24–336)

## 2018-04-24 LAB — VITAMIN B12: Vitamin B-12: 338 pg/mL (ref 180–914)

## 2018-04-24 MED ORDER — CYANOCOBALAMIN 1000 MCG/ML IJ SOLN
INTRAMUSCULAR | Status: AC
  Filled 2018-04-24: qty 1

## 2018-04-24 MED ORDER — INFLUENZA VAC SPLIT QUAD 0.5 ML IM SUSY
0.5000 mL | PREFILLED_SYRINGE | Freq: Once | INTRAMUSCULAR | Status: AC
  Administered 2018-04-24: 0.5 mL via INTRAMUSCULAR

## 2018-04-24 MED ORDER — CYANOCOBALAMIN 1000 MCG/ML IJ SOLN
1000.0000 ug | Freq: Once | INTRAMUSCULAR | Status: AC
  Administered 2018-04-24: 1000 ug via INTRAMUSCULAR

## 2018-04-24 MED ORDER — INFLUENZA VAC SPLIT QUAD 0.5 ML IM SUSY
PREFILLED_SYRINGE | INTRAMUSCULAR | Status: AC
  Filled 2018-04-24: qty 0.5

## 2018-04-24 NOTE — Patient Instructions (Signed)
Influenza (Flu) Vaccine (Inactivated or Recombinant): What You Need to Know  1. Why get vaccinated?  Influenza vaccine can prevent influenza (flu).  Flu is a contagious disease that spreads around the [place] every year, usually between October and May. Anyone can get the flu, but it is more dangerous for some people. Infants and young children, people 53 years of age and older, pregnant women, and people with certain health conditions or a weakened immune system are at greatest risk of flu complications.  Pneumonia, bronchitis, sinus infections and ear infections are examples of flu-related complications. If you have a medical condition, such as heart disease, cancer or diabetes, flu can make it worse.  Flu can cause fever and chills, sore throat, muscle aches, fatigue, cough, headache, and runny or stuffy nose. Some people may have vomiting and diarrhea, though this is more common in children than adults.  Each year thousands of people in the [place] die from flu, and many more are hospitalized. Flu vaccine prevents millions of illnesses and flu-related visits to the doctor each year.  2. Influenza vaccine  CDC recommends everyone 6 months of age and older get vaccinated every flu season. Children 6 months through 8 years of age may need 2 doses during a single flu season. Everyone else needs only 1 dose each flu season.  It takes about 2 weeks for protection to develop after vaccination.  There are many flu viruses, and they are always changing. Each year a new flu vaccine is made to protect against three or four viruses that are likely to cause disease in the upcoming flu season. Even when the vaccine doesn't exactly match these viruses, it may still provide some protection.  Influenza vaccine does not cause flu.  Influenza vaccine may be given at the same time as other vaccines.  3. Talk with your health care provider  Tell your vaccine provider if the person getting the vaccine:  · Has had an  allergic reaction after a previous dose of influenza vaccine, or has any severe, life-threatening allergies.  · Has ever had Guillain-Barré Syndrome (also called GBS).  In some cases, your health care provider may decide to postpone influenza vaccination to a future visit.  People with minor illnesses, such as a cold, may be vaccinated. People who are moderately or severely ill should usually wait until they recover before getting influenza vaccine.  Your health care provider can give you more information.  4. Risks of a vaccine reaction  · Soreness, redness, and swelling where shot is given, fever, muscle aches, and headache can happen after influenza vaccine.  · There may be a very small increased risk of Guillain-Barré Syndrome (GBS) after inactivated influenza vaccine (the flu shot).  Young children who get the flu shot along with pneumococcal vaccine (PCV13), and/or DTaP vaccine at the same time might be slightly more likely to have a seizure caused by fever. Tell your health care provider if a child who is getting flu vaccine has ever had a seizure.  People sometimes faint after medical procedures, including vaccination. Tell your provider if you feel dizzy or have vision changes or ringing in the ears.  As with any medicine, there is a very remote chance of a vaccine causing a severe allergic reaction, other serious injury, or death.  5. What if there is a serious problem?  An allergic reaction could occur after the vaccinated person leaves the clinic. If you see signs of a severe allergic reaction (hives, swelling   of the face and throat, difficulty breathing, a fast heartbeat, dizziness, or weakness), call 9-1-1 and get the person to the nearest hospital.  For other signs that concern you, call your health care provider.  Adverse reactions should be reported to the Vaccine Adverse Event Reporting System (VAERS). Your health care provider will usually file this report, or you can do it yourself. Visit the  VAERS website at www.vaers.hhs.gov or call 1-800-822-7967.VAERS is only for reporting reactions, and VAERS staff do not give medical advice.  6. The National Vaccine Injury Compensation Program  The National Vaccine Injury Compensation Program (VICP) is a federal program that was created to compensate people who may have been injured by certain vaccines. Visit the VICP website at www.hrsa.gov/vaccinecompensation or call 1-800-338-2382 to learn about the program and about filing a claim. There is a time limit to file a claim for compensation.  7. How can I learn more?  · Ask your healthcare provider.  · Call your local or state health department.  · Contact the Centers for Disease Control and Prevention (CDC):  ? Call 1-800-232-4636 (1-800-CDC-INFO) or  ? Visit CDC's www.cdc.gov/flu  Vaccine Information Statement (Interim) Inactivated Influenza Vaccine (11/28/2017)  This information is not intended to replace advice given to you by your health care provider. Make sure you discuss any questions you have with your health care provider.  Document Released: 01/25/2006 Document Revised: 12/02/2017 Document Reviewed: 12/02/2017  Elsevier Interactive Patient Education © 2019 [employer]

## 2018-04-25 ENCOUNTER — Telehealth: Payer: Self-pay | Admitting: *Deleted

## 2018-04-25 ENCOUNTER — Telehealth: Payer: Self-pay | Admitting: Hematology and Oncology

## 2018-04-25 ENCOUNTER — Encounter: Payer: Self-pay | Admitting: Hematology and Oncology

## 2018-04-25 NOTE — Telephone Encounter (Signed)
-----   Message from Heath Lark, MD sent at 04/25/2018  7:38 AM EST ----- Regarding: labs His repeat B12 and iron studies look great; no need iV iron I have sent scheduling msg to see him back in 3 months

## 2018-04-25 NOTE — Telephone Encounter (Signed)
Per 1/9  No los

## 2018-04-25 NOTE — Assessment & Plan Note (Signed)
Vitamin B12 level has improved since he has resume monthly B12 injection. I recommend a trial of changing his B12 injection to every 3 months I will see him again with repeat blood work

## 2018-04-25 NOTE — Assessment & Plan Note (Signed)
He had history of iron deficiency anemia and had recent perceived replacement therapy His repeat iron studies are adequate He does not need iron infusion for now

## 2018-04-25 NOTE — Telephone Encounter (Signed)
Scheduled appt per 1/10 sch message - sent reminder letter in the mail with appt date and time

## 2018-04-25 NOTE — Telephone Encounter (Signed)
Telephone call to patient to advise results as directed below. Left message for return call.

## 2018-04-25 NOTE — Progress Notes (Signed)
[facility] OFFICE PROGRESS NOTE  Little, Jeneen Rinks, MD  ASSESSMENT & PLAN:  B12 deficiency anemia Vitamin B12 level has improved since he has resume monthly B12 injection. I recommend a trial of changing his B12 injection to every 3 months I will see him again with repeat blood work  Iron deficiency anemia due to chronic blood loss He had history of iron deficiency anemia and had recent perceived replacement therapy His repeat iron studies are adequate He does not need iron infusion for now   Orders Placed This Encounter  Procedures  . Iron and TIBC    Standing Status:   Future    Standing Expiration Date:   05/30/2019  . Vitamin B12    Standing Status:   Future    Standing Expiration Date:   05/30/2019  . Sedimentation rate    Standing Status:   Future    Standing Expiration Date:   05/30/2019  . Ferritin    Standing Status:   Future    Standing Expiration Date:   05/30/2019  . CBC with Differential/Platelet    Standing Status:   Future    Standing Expiration Date:   05/30/2019    INTERVAL HISTORY: [name] 53 y.o. male returns for further follow-up regarding history of vitamin B12 deficiency and iron deficiency due to surgery He feels well He has not lost weight The patient denies any recent signs or symptoms of bleeding such as spontaneous epistaxis, hematuria or hematochezia.  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 He also has a history of chronic pain with narcotic dependence and recurrent electrolyte abnormalities. He was also found to  be profoundly anemic with hemoglobin of 6.4 He received blood transfusion and iron infusion prior to hospital discharge On review of his electronic records, he was found to have profound B12 deficiency.  He was started on vitamin B12 replacement therapy.  He was also noted to have abnormal CT scan with liver lesions.  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 He has chronic back pain.  His pain is managed by pain clinic  I have reviewed the past medical history, past surgical history, social history and family history with the patient and they are unchanged from previous note.  ALLERGIES:  is allergic to celecoxib; cyclobenzaprine; dexamethasone; diazepam; etodolac; gabapentin; metaxalone; methocarbamol; pregabalin; propoxyphene; tape; tramadol; clonazepam; and latex.  MEDICATIONS:  Current Outpatient Medications  Medication Sig Dispense Refill  . oxymorphone (OPANA) 10 MG tablet Take 20 mg by mouth 3 (three) times daily.    Marland Kitchen acetaminophen (TYLENOL) 325 MG tablet Take 325-650 mg by mouth every 6 (six) hours as needed for headache.     . albuterol (PROVENTIL HFA;VENTOLIN HFA) 108 (90 Base) MCG/ACT inhaler Inhale 2 puffs into the lungs every 6 (six) hours as needed for wheezing or shortness of breath.    . ferrous sulfate 325 (65 FE) MG tablet Take 1 tablet (325 mg total) by mouth 3 (three) times daily with meals. 90 tablet 0  . loperamide (IMODIUM) 2 MG capsule Take 2 capsules (4 mg total) by mouth 4 (four) times daily. 30 capsule 0  .  Multiple Vitamin (MULTIVITAMIN WITH MINERALS) TABS tablet Take 1 tablet by mouth daily.    . potassium & sodium phosphates (PHOS-NAK) 280-160-250 MG PACK Take 1 packet by mouth 4 (four) times daily. (Patient not taking: Reported on 11/08/2016) 120 packet 0  . psyllium (HYDROCIL/METAMUCIL) 95 % PACK Take 2 packets by mouth 3 (three) times daily. 180 each 0  . vitamin C (ASCORBIC ACID) 500 MG tablet Take 500 mg by mouth daily.     No current  facility-administered medications for this visit.      REVIEW OF SYSTEMS:   Constitutional: Denies fevers, chills or night sweats Eyes: Denies blurriness of vision Ears, nose, mouth, throat, and face: Denies mucositis or sore throat Respiratory: Denies cough, dyspnea or wheezes Cardiovascular: Denies palpitation, chest discomfort or lower extremity swelling Gastrointestinal:  Denies nausea, heartburn or change in bowel habits Skin: Denies abnormal skin rashes Lymphatics: Denies new lymphadenopathy or easy bruising Neurological:Denies numbness, tingling or new weaknesses Behavioral/Psych: Mood is stable, no new changes  All other systems were reviewed with the patient and are negative.  PHYSICAL EXAMINATION: ECOG PERFORMANCE STATUS: 1 - Symptomatic but completely ambulatory  Vitals:   04/24/18 1351  BP: 113/73  Pulse: 67  Resp: 18  Temp: 98.1 F (36.7 C)  SpO2: 99%   Filed Weights   04/24/18 1351  Weight: 202 lb 9.6 oz (91.9 kg)    GENERAL:alert, no distress and comfortable SKIN: skin color, texture, turgor are normal, no rashes or significant lesions EYES: normal, Conjunctiva are pink and non-injected, sclera clear OROPHARYNX:no exudate, no erythema and lips, buccal mucosa, and tongue normal  NECK: supple, thyroid normal size, non-tender, without nodularity LYMPH:  no palpable lymphadenopathy in the cervical, axillary or inguinal LUNGS: clear to auscultation and percussion with normal breathing effort HEART: regular rate & rhythm and no murmurs and no lower extremity edema ABDOMEN:abdomen soft, non-tender and normal bowel sounds Musculoskeletal:no cyanosis of digits and no clubbing  NEURO: alert & oriented x 3 with fluent speech, no focal motor/sensory deficits  LABORATORY DATA:  I have reviewed the data as listed     Component Value Date/Time   NA 143 11/07/2016 1340   K 4.1 11/07/2016 1340   CL 106 06/12/2016 0544   CO2 30 (H) 11/07/2016 1340   GLUCOSE 87  11/07/2016 1340   BUN 3.2 (L) 11/07/2016 1340   CREATININE 1.0 11/07/2016 1340   CALCIUM 10.1 11/07/2016 1340   PROT 7.9 11/07/2016 1340   ALBUMIN 4.5 11/07/2016 1340   AST 31 11/07/2016 1340   ALT 30 11/07/2016 1340   ALKPHOS 98 11/07/2016 1340   BILITOT 0.45 11/07/2016 1340   GFRNONAA >60 06/12/2016 0544   GFRAA >60 06/12/2016 0544    No results found for: SPEP, UPEP  Lab Results  Component Value Date   WBC 7.3 04/24/2018   NEUTROABS 4.1 04/24/2018   HGB 11.5 (L) 04/24/2018   HCT 35.0 (L) 04/24/2018   MCV 90.2 04/24/2018   PLT 159 04/24/2018      Chemistry      Component Value Date/Time   NA 143 11/07/2016 1340   K 4.1 11/07/2016 1340   CL 106 06/12/2016 0544   CO2 30 (H) 11/07/2016 1340   BUN 3.2 (L) 11/07/2016 1340   CREATININE 1.0 11/07/2016 1340      Component Value Date/Time   CALCIUM 10.1 11/07/2016 1340   ALKPHOS 98 11/07/2016 1340   AST 31 11/07/2016 1340   ALT 30 11/07/2016 1340   BILITOT 0.45  11/07/2016 1340     I spent 10 minutes counseling the patient face to face. The total time spent in the appointment was 15 minutes and more than 50% was on counseling.   All questions were answered. The patient knows to call the clinic with any problems, questions or concerns. No barriers to learning was detected.    Heath Lark, MD 1/10/20207:36 AM

## 2018-07-15 ENCOUNTER — Telehealth: Payer: Self-pay

## 2018-07-15 NOTE — Telephone Encounter (Signed)
-----   Message from Heath Lark, MD sent at 07/15/2018 10:54 AM EDT ----- Regarding: delay treatment until May If he feels ok, I recommend delay treatment until May Labs, see me and inj

## 2018-07-15 NOTE — Telephone Encounter (Signed)
LVM for him to return call.

## 2018-07-16 ENCOUNTER — Telehealth: Payer: Self-pay | Admitting: Hematology and Oncology

## 2018-07-16 NOTE — Telephone Encounter (Signed)
Left message - unable to reach patient - left message with appt date and time

## 2018-07-25 ENCOUNTER — Other Ambulatory Visit: Payer: Self-pay

## 2018-07-25 ENCOUNTER — Ambulatory Visit: Payer: Self-pay | Admitting: Hematology and Oncology

## 2018-07-25 ENCOUNTER — Ambulatory Visit: Payer: Self-pay

## 2018-09-04 ENCOUNTER — Inpatient Hospital Stay: Payer: Medicare Other

## 2018-09-04 ENCOUNTER — Telehealth: Payer: Self-pay | Admitting: Hematology and Oncology

## 2018-09-04 ENCOUNTER — Inpatient Hospital Stay: Payer: Medicare Other | Admitting: Hematology and Oncology

## 2018-09-04 NOTE — Telephone Encounter (Signed)
Called pt per 5/21 sch message- no answer . Left message for patient to cal back to reschedule appt

## 2018-09-29 ENCOUNTER — Encounter: Payer: Self-pay | Admitting: Hematology and Oncology

## 2018-09-29 ENCOUNTER — Other Ambulatory Visit: Payer: Self-pay

## 2018-09-29 ENCOUNTER — Inpatient Hospital Stay: Payer: Medicare Other

## 2018-09-29 ENCOUNTER — Inpatient Hospital Stay: Payer: Medicare Other | Attending: Hematology and Oncology

## 2018-09-29 ENCOUNTER — Inpatient Hospital Stay (HOSPITAL_BASED_OUTPATIENT_CLINIC_OR_DEPARTMENT_OTHER): Payer: Medicare Other | Admitting: Hematology and Oncology

## 2018-09-29 VITALS — BP 126/63 | HR 67 | Temp 98.9°F | Resp 16 | Wt 184.5 lb

## 2018-09-29 DIAGNOSIS — D518 Other vitamin B12 deficiency anemias: Secondary | ICD-10-CM

## 2018-09-29 DIAGNOSIS — D519 Vitamin B12 deficiency anemia, unspecified: Secondary | ICD-10-CM | POA: Insufficient documentation

## 2018-09-29 DIAGNOSIS — Z79899 Other long term (current) drug therapy: Secondary | ICD-10-CM

## 2018-09-29 DIAGNOSIS — G8929 Other chronic pain: Secondary | ICD-10-CM | POA: Insufficient documentation

## 2018-09-29 DIAGNOSIS — M549 Dorsalgia, unspecified: Secondary | ICD-10-CM | POA: Diagnosis not present

## 2018-09-29 DIAGNOSIS — R5383 Other fatigue: Secondary | ICD-10-CM

## 2018-09-29 DIAGNOSIS — D500 Iron deficiency anemia secondary to blood loss (chronic): Secondary | ICD-10-CM | POA: Diagnosis not present

## 2018-09-29 LAB — CBC WITH DIFFERENTIAL/PLATELET
Abs Immature Granulocytes: 0.01 10*3/uL (ref 0.00–0.07)
Basophils Absolute: 0.1 10*3/uL (ref 0.0–0.1)
Basophils Relative: 1 %
Eosinophils Absolute: 0.3 10*3/uL (ref 0.0–0.5)
Eosinophils Relative: 5 %
HCT: 37.5 % — ABNORMAL LOW (ref 39.0–52.0)
Hemoglobin: 12.1 g/dL — ABNORMAL LOW (ref 13.0–17.0)
Immature Granulocytes: 0 %
Lymphocytes Relative: 33 %
Lymphs Abs: 1.8 10*3/uL (ref 0.7–4.0)
MCH: 29.5 pg (ref 26.0–34.0)
MCHC: 32.3 g/dL (ref 30.0–36.0)
MCV: 91.5 fL (ref 80.0–100.0)
Monocytes Absolute: 0.6 10*3/uL (ref 0.1–1.0)
Monocytes Relative: 10 %
Neutro Abs: 2.7 10*3/uL (ref 1.7–7.7)
Neutrophils Relative %: 51 %
Platelets: 160 10*3/uL (ref 150–400)
RBC: 4.1 MIL/uL — ABNORMAL LOW (ref 4.22–5.81)
RDW: 12 % (ref 11.5–15.5)
WBC: 5.4 10*3/uL (ref 4.0–10.5)
nRBC: 0 % (ref 0.0–0.2)

## 2018-09-29 LAB — FERRITIN: Ferritin: 401 ng/mL — ABNORMAL HIGH (ref 24–336)

## 2018-09-29 LAB — IRON AND TIBC
Iron: 117 ug/dL (ref 42–163)
Saturation Ratios: 41 % (ref 20–55)
TIBC: 283 ug/dL (ref 202–409)
UIBC: 165 ug/dL (ref 117–376)

## 2018-09-29 LAB — SEDIMENTATION RATE: Sed Rate: 3 mm/hr (ref 0–16)

## 2018-09-29 LAB — VITAMIN B12: Vitamin B-12: 300 pg/mL (ref 180–914)

## 2018-09-29 MED ORDER — CYANOCOBALAMIN 1000 MCG/ML IJ SOLN
1000.0000 ug | Freq: Once | INTRAMUSCULAR | Status: AC
  Administered 2018-09-29: 1000 ug via INTRAMUSCULAR

## 2018-09-29 MED ORDER — CYANOCOBALAMIN 1000 MCG/ML IJ SOLN
INTRAMUSCULAR | Status: AC
  Filled 2018-09-29: qty 1

## 2018-09-29 NOTE — Progress Notes (Signed)
[facility] OFFICE PROGRESS NOTE  Tamsen Roers, MD  ASSESSMENT & PLAN:  Iron deficiency anemia due to chronic blood loss He had history of iron deficiency anemia and had recent perceived replacement therapy His repeat iron studies are pending I will call him with test results tomorrow It is unlikely he needs iron replacement therapy given his hemoglobin is near normal  B12 deficiency anemia His last vitamin B12 level was adequate He will likely need vitamin B12 replacement therapy every 3 to 4 months I will call him with test results tomorrow He will proceed with vitamin B12 replacement therapy today   Orders Placed This Encounter  Procedures  . SCHEDULING COMMUNICATION INJECTION    Schedule 15 minute injection appointment    INTERVAL HISTORY: Roger HEINLE 53 y.o. 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 He also has a history of chronic pain with narcotic dependence and recurrent electrolyte abnormalities. He was also found to be profoundly anemic with hemoglobin of 6.4 He received blood transfusion and iron infusion prior to hospital discharge On review of his electronic records, he was found to have profound B12 deficiency.  He was started on vitamin B12 replacement therapy.  He was also noted to have abnormal CT scan with liver lesions.  MRI of the abdomen  done in April  confirm hemangiomas. He has chronic back pain.  His pain is managed by pain clinic  I have reviewed the past medical history, past surgical history, social history and family history with the patient and they are unchanged from previous note.  ALLERGIES:  is allergic to celecoxib; cyclobenzaprine; dexamethasone; diazepam; etodolac; gabapentin; metaxalone; methocarbamol; pregabalin; propoxyphene; tape; tramadol; clonazepam; and latex.  MEDICATIONS:  Current Outpatient Medications  Medication Sig Dispense Refill  . diazepam (VALIUM) 5 MG tablet Take 5 mg by mouth 3 (three) times daily as needed.    Marland Kitchen HYDROcodone-acetaminophen (NORCO) 10-325 MG tablet Take 1 tablet by mouth 3 (three) times daily as needed.    Marland Kitchen acetaminophen (TYLENOL) 325 MG tablet Take 325-650 mg by mouth every 6 (six) hours as needed for headache.     . albuterol (PROVENTIL HFA;VENTOLIN HFA) 108 (90 Base) MCG/ACT inhaler Inhale 2 puffs into the lungs every 6 (six) hours as needed for wheezing or shortness of breath.    . ferrous sulfate 325 (65 FE) MG tablet Take 1 tablet (325 mg total) by mouth 3 (three) times daily with meals. 90 tablet 0  . loperamide (IMODIUM) 2 MG capsule Take 2 capsules (4 mg total) by mouth 4 (four) times daily. 30 capsule 0  . Multiple Vitamin (MULTIVITAMIN WITH MINERALS) TABS tablet Take 1 tablet by mouth daily.    Marland Kitchen oxymorphone (OPANA) 10 MG tablet Take 20 mg by mouth 3 (three) times daily.    . potassium & sodium phosphates (PHOS-NAK) 280-160-250 MG PACK Take 1 packet by mouth 4 (four) times daily. (Patient not taking: Reported on 11/08/2016) 120 packet 0  . psyllium (HYDROCIL/METAMUCIL) 95 % PACK  Take 2 packets by mouth 3 (three) times daily. 180 each 0  . vitamin C (ASCORBIC ACID) 500 MG tablet Take 500 mg by mouth daily.     Current Facility-Administered Medications  Medication Dose Route Frequency Provider Last Rate Last Dose  . cyanocobalamin ((VITAMIN B-12)) injection 1,000 mcg  1,000 mcg  Intramuscular Once Heath Lark, MD         REVIEW OF SYSTEMS:   Constitutional: Denies fevers, chills or night sweats Eyes: Denies blurriness of vision Ears, nose, mouth, throat, and face: Denies mucositis or sore throat Respiratory: Denies cough, dyspnea or wheezes Cardiovascular: Denies palpitation, chest discomfort or lower extremity swelling Gastrointestinal:  Denies nausea, heartburn or change in bowel habits Skin: Denies abnormal skin rashes Lymphatics: Denies new lymphadenopathy or easy bruising Neurological:Denies numbness, tingling or new weaknesses Behavioral/Psych: Mood is stable, no new changes  All other systems were reviewed with the patient and are negative.  PHYSICAL EXAMINATION: ECOG PERFORMANCE STATUS: 1 - Symptomatic but completely ambulatory  Vitals:   09/29/18 1243  BP: 126/63  Pulse: 67  Resp: 16  Temp: 98.9 F (37.2 C)  SpO2: 99%   Filed Weights   09/29/18 1243  Weight: 184 lb 8 oz (83.7 kg)    GENERAL:alert, no distress and comfortable NEURO: alert & oriented x 3 with fluent speech, no focal motor/sensory deficits  LABORATORY DATA:  I have reviewed the data as listed     Component Value Date/Time   NA 143 11/07/2016 1340   K 4.1 11/07/2016 1340   CL 106 06/12/2016 0544   CO2 30 (H) 11/07/2016 1340   GLUCOSE 87 11/07/2016 1340   BUN 3.2 (L) 11/07/2016 1340   CREATININE 1.0 11/07/2016 1340   CALCIUM 10.1 11/07/2016 1340   PROT 7.9 11/07/2016 1340   ALBUMIN 4.5 11/07/2016 1340   AST 31 11/07/2016 1340   ALT 30 11/07/2016 1340   ALKPHOS 98 11/07/2016 1340   BILITOT 0.45 11/07/2016 1340   GFRNONAA >60 06/12/2016 0544   GFRAA >60 06/12/2016 0544    No results found for: SPEP, UPEP  Lab Results  Component Value Date   WBC 5.4 09/29/2018   NEUTROABS 2.7 09/29/2018   HGB 12.1 (L) 09/29/2018   HCT 37.5 (L) 09/29/2018   MCV 91.5 09/29/2018   PLT 160 09/29/2018      Chemistry      Component Value Date/Time   NA 143 11/07/2016 1340    K 4.1 11/07/2016 1340   CL 106 06/12/2016 0544   CO2 30 (H) 11/07/2016 1340   BUN 3.2 (L) 11/07/2016 1340   CREATININE 1.0 11/07/2016 1340      Component Value Date/Time   CALCIUM 10.1 11/07/2016 1340   ALKPHOS 98 11/07/2016 1340   AST 31 11/07/2016 1340   ALT 30 11/07/2016 1340   BILITOT 0.45 11/07/2016 1340      I spent 10 minutes counseling the patient face to face. The total time spent in the appointment was 15 minutes and more than 50% was on counseling.   All questions were answered. The patient knows to call the clinic with any problems, questions or concerns. No barriers to learning was detected.    Heath Lark, MD 6/15/202012:49 PM

## 2018-09-29 NOTE — Assessment & Plan Note (Signed)
His last vitamin B12 level was adequate He will likely need vitamin B12 replacement therapy every 3 to 4 months I will call him with test results tomorrow He will proceed with vitamin B12 replacement therapy today

## 2018-09-29 NOTE — Patient Instructions (Signed)
Cyanocobalamin, Vitamin B12 injection What is this medicine? CYANOCOBALAMIN (sye an oh koe BAL a min) is a man made form of vitamin B12. Vitamin B12 is used in the growth of healthy blood cells, nerve cells, and proteins in the body. It also helps with the metabolism of fats and carbohydrates. This medicine is used to treat people who can not absorb vitamin B12. This medicine may be used for other purposes; ask your health care provider or pharmacist if you have questions. COMMON BRAND NAME(S): B-12 Compliance Kit, B-12 Injection Kit, Cyomin, LA-12, Nutri-Twelve, Physicians EZ Use B-12, Primabalt What should I tell my health care provider before I take this medicine? They need to know if you have any of these conditions: -kidney disease -Leber's disease -megaloblastic anemia -an unusual or allergic reaction to cyanocobalamin, cobalt, other medicines, foods, dyes, or preservatives -pregnant or trying to get pregnant -breast-feeding How should I use this medicine? This medicine is injected into a muscle or deeply under the skin. It is usually given by a health care professional in a clinic or doctor's office. However, your doctor may teach you how to inject yourself. Follow all instructions. Talk to your pediatrician regarding the use of this medicine in children. Special care may be needed. Overdosage: If you think you have taken too much of this medicine contact a poison control center or emergency room at once. NOTE: This medicine is only for you. Do not share this medicine with others. What if I miss a dose? If you are given your dose at a clinic or doctor's office, call to reschedule your appointment. If you give your own injections and you miss a dose, take it as soon as you can. If it is almost time for your next dose, take only that dose. Do not take double or extra doses. What may interact with this medicine? -colchicine -heavy alcohol intake This list may not describe all possible  interactions. Give your health care provider a list of all the medicines, herbs, non-prescription drugs, or dietary supplements you use. Also tell them if you smoke, drink alcohol, or use illegal drugs. Some items may interact with your medicine. What should I watch for while using this medicine? Visit your doctor or health care professional regularly. You may need blood work done while you are taking this medicine. You may need to follow a special diet. Talk to your doctor. Limit your alcohol intake and avoid smoking to get the best benefit. What side effects may I notice from receiving this medicine? Side effects that you should report to your doctor or health care professional as soon as possible: -allergic reactions like skin rash, itching or hives, swelling of the face, lips, or tongue -blue tint to skin -chest tightness, pain -difficulty breathing, wheezing -dizziness -red, swollen painful area on the leg Side effects that usually do not require medical attention (report to your doctor or health care professional if they continue or are bothersome): -diarrhea -headache This list may not describe all possible side effects. Call your doctor for medical advice about side effects. You may report side effects to FDA at 1-800-FDA-1088. Where should I keep my medicine? Keep out of the reach of children. Store at room temperature between 15 and 30 degrees C (59 and 85 degrees F). Protect from light. Throw away any unused medicine after the expiration date. NOTE: This sheet is a summary. It may not cover all possible information. If you have questions about this medicine, talk to your doctor, pharmacist, or   health care provider.  2019 Elsevier/Gold Standard (2007-07-14 22:10:20)  

## 2018-09-29 NOTE — Assessment & Plan Note (Signed)
He had history of iron deficiency anemia and had recent perceived replacement therapy His repeat iron studies are pending I will call him with test results tomorrow It is unlikely he needs iron replacement therapy given his hemoglobin is near normal

## 2018-09-30 ENCOUNTER — Telehealth: Payer: Self-pay | Admitting: Hematology and Oncology

## 2018-09-30 NOTE — Telephone Encounter (Signed)
I talk with patient regarding schedule  

## 2019-01-15 ENCOUNTER — Other Ambulatory Visit: Payer: Self-pay | Admitting: Hematology and Oncology

## 2019-01-15 DIAGNOSIS — D518 Other vitamin B12 deficiency anemias: Secondary | ICD-10-CM

## 2019-01-15 DIAGNOSIS — E559 Vitamin D deficiency, unspecified: Secondary | ICD-10-CM

## 2019-01-15 DIAGNOSIS — D500 Iron deficiency anemia secondary to blood loss (chronic): Secondary | ICD-10-CM

## 2019-01-27 ENCOUNTER — Other Ambulatory Visit: Payer: Self-pay

## 2019-01-27 ENCOUNTER — Inpatient Hospital Stay: Payer: Medicare Other | Attending: Hematology and Oncology

## 2019-01-27 DIAGNOSIS — D519 Vitamin B12 deficiency anemia, unspecified: Secondary | ICD-10-CM | POA: Insufficient documentation

## 2019-01-27 DIAGNOSIS — M549 Dorsalgia, unspecified: Secondary | ICD-10-CM | POA: Insufficient documentation

## 2019-01-27 DIAGNOSIS — Z79899 Other long term (current) drug therapy: Secondary | ICD-10-CM | POA: Insufficient documentation

## 2019-01-27 DIAGNOSIS — K922 Gastrointestinal hemorrhage, unspecified: Secondary | ICD-10-CM | POA: Insufficient documentation

## 2019-01-27 DIAGNOSIS — D500 Iron deficiency anemia secondary to blood loss (chronic): Secondary | ICD-10-CM | POA: Diagnosis not present

## 2019-01-27 DIAGNOSIS — E559 Vitamin D deficiency, unspecified: Secondary | ICD-10-CM | POA: Diagnosis not present

## 2019-01-27 DIAGNOSIS — Z888 Allergy status to other drugs, medicaments and biological substances status: Secondary | ICD-10-CM | POA: Insufficient documentation

## 2019-01-27 DIAGNOSIS — D518 Other vitamin B12 deficiency anemias: Secondary | ICD-10-CM

## 2019-01-27 DIAGNOSIS — E878 Other disorders of electrolyte and fluid balance, not elsewhere classified: Secondary | ICD-10-CM | POA: Insufficient documentation

## 2019-01-27 DIAGNOSIS — F1120 Opioid dependence, uncomplicated: Secondary | ICD-10-CM | POA: Diagnosis not present

## 2019-01-27 DIAGNOSIS — K651 Peritoneal abscess: Secondary | ICD-10-CM | POA: Diagnosis not present

## 2019-01-27 DIAGNOSIS — G8929 Other chronic pain: Secondary | ICD-10-CM | POA: Insufficient documentation

## 2019-01-27 DIAGNOSIS — Z885 Allergy status to narcotic agent status: Secondary | ICD-10-CM | POA: Diagnosis not present

## 2019-01-27 DIAGNOSIS — Z23 Encounter for immunization: Secondary | ICD-10-CM | POA: Diagnosis not present

## 2019-01-27 LAB — CBC WITH DIFFERENTIAL/PLATELET
Abs Immature Granulocytes: 0.03 10*3/uL (ref 0.00–0.07)
Basophils Absolute: 0.1 10*3/uL (ref 0.0–0.1)
Basophils Relative: 1 %
Eosinophils Absolute: 0.3 10*3/uL (ref 0.0–0.5)
Eosinophils Relative: 4 %
HCT: 39.6 % (ref 39.0–52.0)
Hemoglobin: 13 g/dL (ref 13.0–17.0)
Immature Granulocytes: 0 %
Lymphocytes Relative: 30 %
Lymphs Abs: 2.1 10*3/uL (ref 0.7–4.0)
MCH: 29.7 pg (ref 26.0–34.0)
MCHC: 32.8 g/dL (ref 30.0–36.0)
MCV: 90.4 fL (ref 80.0–100.0)
Monocytes Absolute: 0.7 10*3/uL (ref 0.1–1.0)
Monocytes Relative: 10 %
Neutro Abs: 3.8 10*3/uL (ref 1.7–7.7)
Neutrophils Relative %: 55 %
Platelets: 207 10*3/uL (ref 150–400)
RBC: 4.38 MIL/uL (ref 4.22–5.81)
RDW: 12.2 % (ref 11.5–15.5)
WBC: 6.9 10*3/uL (ref 4.0–10.5)
nRBC: 0 % (ref 0.0–0.2)

## 2019-01-27 LAB — SEDIMENTATION RATE: Sed Rate: 4 mm/hr (ref 0–16)

## 2019-01-27 LAB — IRON AND TIBC
Iron: 124 ug/dL (ref 42–163)
Saturation Ratios: 39 % (ref 20–55)
TIBC: 320 ug/dL (ref 202–409)
UIBC: 196 ug/dL (ref 117–376)

## 2019-01-27 LAB — VITAMIN D 25 HYDROXY (VIT D DEFICIENCY, FRACTURES): Vit D, 25-Hydroxy: 28.91 ng/mL — ABNORMAL LOW (ref 30–100)

## 2019-01-27 LAB — FERRITIN: Ferritin: 446 ng/mL — ABNORMAL HIGH (ref 24–336)

## 2019-01-27 LAB — VITAMIN B12: Vitamin B-12: 375 pg/mL (ref 180–914)

## 2019-01-29 ENCOUNTER — Inpatient Hospital Stay: Payer: Medicare Other

## 2019-01-29 ENCOUNTER — Other Ambulatory Visit: Payer: Medicare Other

## 2019-01-29 ENCOUNTER — Ambulatory Visit: Payer: Medicare Other

## 2019-01-29 ENCOUNTER — Other Ambulatory Visit: Payer: Self-pay | Admitting: Hematology and Oncology

## 2019-01-29 ENCOUNTER — Encounter: Payer: Self-pay | Admitting: Hematology and Oncology

## 2019-01-29 ENCOUNTER — Other Ambulatory Visit: Payer: Self-pay

## 2019-01-29 ENCOUNTER — Inpatient Hospital Stay (HOSPITAL_BASED_OUTPATIENT_CLINIC_OR_DEPARTMENT_OTHER): Payer: Medicare Other | Admitting: Hematology and Oncology

## 2019-01-29 ENCOUNTER — Ambulatory Visit: Payer: Medicare Other | Admitting: Hematology and Oncology

## 2019-01-29 VITALS — BP 119/91 | HR 68 | Temp 98.9°F | Resp 18 | Ht 69.0 in | Wt 185.6 lb

## 2019-01-29 DIAGNOSIS — E559 Vitamin D deficiency, unspecified: Secondary | ICD-10-CM | POA: Diagnosis not present

## 2019-01-29 DIAGNOSIS — D518 Other vitamin B12 deficiency anemias: Secondary | ICD-10-CM

## 2019-01-29 DIAGNOSIS — D500 Iron deficiency anemia secondary to blood loss (chronic): Secondary | ICD-10-CM

## 2019-01-29 DIAGNOSIS — Z23 Encounter for immunization: Secondary | ICD-10-CM

## 2019-01-29 MED ORDER — INFLUENZA VAC SPLIT QUAD 0.5 ML IM SUSY
PREFILLED_SYRINGE | INTRAMUSCULAR | Status: AC
  Filled 2019-01-29: qty 0.5

## 2019-01-29 MED ORDER — CYANOCOBALAMIN 1000 MCG/ML IJ SOLN
INTRAMUSCULAR | Status: AC
  Filled 2019-01-29: qty 1

## 2019-01-29 MED ORDER — CYANOCOBALAMIN 1000 MCG/ML IJ SOLN
1000.0000 ug | Freq: Once | INTRAMUSCULAR | Status: AC
  Administered 2019-01-29: 1000 ug via INTRAMUSCULAR

## 2019-01-29 MED ORDER — INFLUENZA VAC SPLIT QUAD 0.5 ML IM SUSY
0.5000 mL | PREFILLED_SYRINGE | Freq: Once | INTRAMUSCULAR | Status: AC
  Administered 2019-01-29: 0.5 mL via INTRAMUSCULAR

## 2019-01-29 NOTE — Assessment & Plan Note (Signed)
He has persistent vitamin D deficiency despite taking 2000 units a day I recommend increasing the vitamin D supplement and I plan to recheck it again in 6 months

## 2019-01-29 NOTE — Progress Notes (Signed)
[facility] OFFICE PROGRESS NOTE  Tamsen Roers, MD  ASSESSMENT & PLAN:  Iron deficiency anemia due to chronic blood loss He has no further signs of iron deficiency I plan to recheck iron studies again in 6 months  B12 deficiency anemia His B12 level is adequate I plan to recheck it again and give him another injection in 6 months  Vitamin D deficiency He has persistent vitamin D deficiency despite taking 2000 units a day I recommend increasing the vitamin D supplement and I plan to recheck it again in 6 months   Orders Placed This Encounter  Procedures  . CBC with Differential/Platelet    Standing Status:   Future    Standing Expiration Date:   03/04/2020  . Ferritin    Standing Status:   Future    Standing Expiration Date:   01/29/2020  . Iron and TIBC    Standing Status:   Future    Standing Expiration Date:   03/04/2020  . Sedimentation rate    Standing Status:   Future    Standing Expiration Date:   03/04/2020  . Vitamin B12    Standing Status:   Future    Standing Expiration Date:   03/04/2020  . VITAMIN D 25 Hydroxy (Vit-D Deficiency, Fractures)    Standing Status:   Future    Standing Expiration Date:   07/29/2020    INTERVAL HISTORY: [name] 53 y.o. 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 He also has a history of chronic pain with narcotic dependence and recurrent  electrolyte abnormalities. He was also found to be profoundly anemic with hemoglobin of 6.4 He received blood transfusion and iron infusion prior to hospital discharge On review of his electronic records, he was found to have profound B12 deficiency.  He was started on vitamin B12 replacement therapy.  He was also noted to have abnormal CT scan with liver lesions.  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 He has chronic back pain.  His pain is managed by pain clinic  I have reviewed the past medical history, past surgical history, social history and family history with the patient and they are unchanged from previous note.  ALLERGIES:  is allergic to celecoxib; cyclobenzaprine; dexamethasone; diazepam; etodolac; gabapentin; metaxalone; methocarbamol; pregabalin; propoxyphene; tape; tramadol; clonazepam; and latex.  MEDICATIONS:  Current Outpatient Medications  Medication Sig Dispense Refill  . cholecalciferol (VITAMIN D3) 25 MCG (1000 UT) tablet Take 2,000 Units by mouth daily.    Marland Kitchen acetaminophen (TYLENOL) 325 MG tablet Take 325-650 mg by mouth every 6 (six) hours as needed for headache.     . albuterol (PROVENTIL HFA;VENTOLIN HFA) 108 (90 Base) MCG/ACT inhaler Inhale 2 puffs into the lungs every 6 (six) hours as needed for wheezing or shortness of breath.    . diazepam (VALIUM) 5 MG tablet Take 5 mg by mouth 3 (three) times daily as needed.    . ferrous sulfate 325 (65 FE) MG tablet Take  1 tablet (325 mg total) by mouth 3 (three) times daily with meals. 90 tablet 0  . HYDROcodone-acetaminophen (NORCO) 10-325 MG tablet Take 1 tablet by mouth 3 (three) times daily as needed.    . loperamide (IMODIUM) 2 MG capsule Take 2 capsules (4 mg total) by mouth 4 (four) times daily. 30 capsule 0  . Multiple Vitamin (MULTIVITAMIN WITH MINERALS) TABS tablet Take 1 tablet by mouth daily.    Marland Kitchen oxymorphone (OPANA) 10 MG tablet Take 20 mg by mouth 3 (three) times daily.    . potassium & sodium  phosphates (PHOS-NAK) 280-160-250 MG PACK Take 1 packet by mouth 4 (four) times daily. (Patient not taking: Reported on 11/08/2016) 120 packet 0  . psyllium (HYDROCIL/METAMUCIL) 95 % PACK Take 2 packets by mouth 3 (three) times daily. 180 each 0  . vitamin C (ASCORBIC ACID) 500 MG tablet Take 500 mg by mouth daily.     No current facility-administered medications for this visit.      REVIEW OF SYSTEMS:   Constitutional: Denies fevers, chills or night sweats Eyes: Denies blurriness of vision Ears, nose, mouth, throat, and face: Denies mucositis or sore throat Respiratory: Denies cough, dyspnea or wheezes Cardiovascular: Denies palpitation, chest discomfort or lower extremity swelling Gastrointestinal:  Denies nausea, heartburn or change in bowel habits Skin: Denies abnormal skin rashes Lymphatics: Denies new lymphadenopathy or easy bruising Neurological:Denies numbness, tingling or new weaknesses Behavioral/Psych: Mood is stable, no new changes  All other systems were reviewed with the patient and are negative.  PHYSICAL EXAMINATION: ECOG PERFORMANCE STATUS: 0 - Asymptomatic  Vitals:   01/29/19 1019  BP: (!) 119/91  Pulse: 68  Resp: 18  Temp: 98.9 F (37.2 C)  SpO2: 98%   Filed Weights   01/29/19 1019  Weight: 185 lb 9.6 oz (84.2 kg)    GENERAL:alert, no distress and comfortable NEURO: alert & oriented x 3 with fluent speech, no focal motor/sensory deficits  LABORATORY DATA:  I have reviewed the data as listed     Component Value Date/Time   NA 143 11/07/2016 1340   K 4.1 11/07/2016 1340   CL 106 06/12/2016 0544   CO2 30 (H) 11/07/2016 1340   GLUCOSE 87 11/07/2016 1340   BUN 3.2 (L) 11/07/2016 1340   CREATININE 1.0 11/07/2016 1340   CALCIUM 10.1 11/07/2016 1340   PROT 7.9 11/07/2016 1340   ALBUMIN 4.5 11/07/2016 1340   AST 31 11/07/2016 1340   ALT 30 11/07/2016 1340   ALKPHOS 98 11/07/2016 1340   BILITOT 0.45 11/07/2016 1340   GFRNONAA >60 06/12/2016 0544    GFRAA >60 06/12/2016 0544    No results found for: SPEP, UPEP  Lab Results  Component Value Date   WBC 6.9 01/27/2019   NEUTROABS 3.8 01/27/2019   HGB 13.0 01/27/2019   HCT 39.6 01/27/2019   MCV 90.4 01/27/2019   PLT 207 01/27/2019      Chemistry      Component Value Date/Time   NA 143 11/07/2016 1340   K 4.1 11/07/2016 1340   CL 106 06/12/2016 0544   CO2 30 (H) 11/07/2016 1340   BUN 3.2 (L) 11/07/2016 1340   CREATININE 1.0 11/07/2016 1340      Component Value Date/Time   CALCIUM 10.1 11/07/2016 1340   ALKPHOS 98 11/07/2016 1340   AST 31 11/07/2016 1340   ALT 30 11/07/2016 1340   BILITOT 0.45 11/07/2016 1340      I spent 10 minutes counseling the patient face to  face. The total time spent in the appointment was 15 minutes and more than 50% was on counseling.   All questions were answered. The patient knows to call the clinic with any problems, questions or concerns. No barriers to learning was detected.    Heath Lark, MD 10/15/20201:00 PM

## 2019-01-29 NOTE — Assessment & Plan Note (Signed)
He has no further signs of iron deficiency I plan to recheck iron studies again in 6 months

## 2019-01-29 NOTE — Patient Instructions (Signed)
Influenza Virus Vaccine injection What is this medicine? INFLUENZA VIRUS VACCINE (in floo EN zuh VAHY ruhs vak SEEN) helps to reduce the risk of getting influenza also known as the flu. The vaccine only helps protect you against some strains of the flu. This medicine may be used for other purposes; ask your health care provider or pharmacist if you have questions. COMMON BRAND NAME(S): Afluria, Afluria Quadrivalent, Agriflu, Alfuria, FLUAD, Fluarix, Fluarix Quadrivalent, Flublok, Flublok Quadrivalent, FLUCELVAX, Flulaval, Fluvirin, Fluzone, Fluzone High-Dose, Fluzone Intradermal What should I tell my health care provider before I take this medicine? They need to know if you have any of these conditions:  bleeding disorder like hemophilia  fever or infection  Guillain-Barre syndrome or other neurological problems  immune system problems  infection with the human immunodeficiency virus (HIV) or AIDS  low blood platelet counts  multiple sclerosis  an unusual or allergic reaction to influenza virus vaccine, latex, other medicines, foods, dyes, or preservatives. Different brands of vaccines contain different allergens. Some may contain latex or eggs. Talk to your doctor about your allergies to make sure that you get the right vaccine.  pregnant or trying to get pregnant  breast-feeding How should I use this medicine? This vaccine is for injection into a muscle or under the skin. It is given by a health care professional. A copy of Vaccine Information Statements will be given before each vaccination. Read this sheet carefully each time. The sheet may change frequently. Talk to your healthcare provider to see which vaccines are right for you. Some vaccines should not be used in all age groups. Overdosage: If you think you have taken too much of this medicine contact a poison control center or emergency room at once. NOTE: This medicine is only for you. Do not share this medicine with  others. What if I miss a dose? This does not apply. What may interact with this medicine?  chemotherapy or radiation therapy  medicines that lower your immune system like etanercept, anakinra, infliximab, and adalimumab  medicines that treat or prevent blood clots like warfarin  phenytoin  steroid medicines like prednisone or cortisone  theophylline  vaccines This list may not describe all possible interactions. Give your health care provider a list of all the medicines, herbs, non-prescription drugs, or dietary supplements you use. Also tell them if you smoke, drink alcohol, or use illegal drugs. Some items may interact with your medicine. What should I watch for while using this medicine? Report any side effects that do not go away within 3 days to your doctor or health care professional. Call your health care provider if any unusual symptoms occur within 6 weeks of receiving this vaccine. You may still catch the flu, but the illness is not usually as bad. You cannot get the flu from the vaccine. The vaccine will not protect against colds or other illnesses that may cause fever. The vaccine is needed every year. What side effects may I notice from receiving this medicine? Side effects that you should report to your doctor or health care professional as soon as possible:  allergic reactions like skin rash, itching or hives, swelling of the face, lips, or tongue Side effects that usually do not require medical attention (report to your doctor or health care professional if they continue or are bothersome):  fever  headache  muscle aches and pains  pain, tenderness, redness, or swelling at the injection site  tiredness This list may not describe all possible side effects. Call your   doctor for medical advice about side effects. You may report side effects to FDA at 1-800-FDA-1088. Where should I keep my medicine? The vaccine will be given by a health care professional in a  clinic, pharmacy, doctor's office, or other health care setting. You will not be given vaccine doses to store at home. NOTE: This sheet is a summary. It may not cover all possible information. If you have questions about this medicine, talk to your doctor, pharmacist, or health care provider.  2020 Elsevier/Gold Standard (2018-02-25 08:45:43) Cyanocobalamin, Pyridoxine, and Folate What is this medicine? A multivitamin containing folic acid, vitamin B6, and vitamin B12. This medicine may be used for other purposes; ask your health care provider or pharmacist if you have questions. COMMON BRAND NAME(S): AllanFol RX, AllanTex, Av-Vite FB, B Complex with Folic Acid, ComBgen, FaBB, Folamin, Folastin, [place], [place], [place], [place], [place], [employer], Kaycee RX 2.2, [place], [place] 2.2, Foltabs 800, Foltx, Homocysteine Formula, Niva-Fol, NuFol, [employer], Virt-Gard, Virt-Vite, Virt-Vite [place], Vita-Respa What should I tell my health care provider before I take this medicine? They need to know if you have any of these conditions:  bleeding or clotting disorder  history of anemia of any type  other chronic health condition  an unusual or allergic reaction to vitamins, other medicines, foods, dyes, or preservatives  pregnant or trying to get pregnant  breast-feeding How should I use this medicine? Take by mouth with a glass of water. May take with food. Follow the directions on the prescription label. It is usually given once a day. Do not take your medicine more often than directed. Contact your pediatrician regarding the use of this medicine in children. Special care may be needed. Overdosage: If you think you have taken too much of this medicine contact a poison control center or emergency room at once. NOTE: This medicine is only for you. Do not share this medicine with others. What if I miss a dose? If you miss a dose, take it as soon as you can. If it is almost time for your next  dose, take only that dose. Do not take double or extra doses. What may interact with this medicine?  levodopa This list may not describe all possible interactions. Give your health care provider a list of all the medicines, herbs, non-prescription drugs, or dietary supplements you use. Also tell them if you smoke, drink alcohol, or use illegal drugs. Some items may interact with your medicine. What should I watch for while using this medicine? See your health care professional for regular checks on your progress. Remember that vitamin supplements do not replace the need for good nutrition from a balanced diet. What side effects may I notice from receiving this medicine? Side effects that you should report to your doctor or health care professional as soon as possible:  allergic reaction such as skin rash or difficulty breathing  vomiting Side effects that usually do not require medical attention (report to your doctor or health care professional if they continue or are bothersome):  nausea  stomach upset This list may not describe all possible side effects. Call your doctor for medical advice about side effects. You may report side effects to FDA at 1-800-FDA-1088. Where should I keep my medicine? Keep out of the reach of children. Most vitamins should be stored at controlled room temperature. Check your specific product directions. Protect from heat and moisture. Throw away any unused medicine after the expiration date. NOTE: This sheet is a summary. It  may not cover all possible information. If you have questions about this medicine, talk to your doctor, pharmacist, or health care provider.  2020 Elsevier/Gold Standard (2007-05-24 00:59:55)

## 2019-01-29 NOTE — Assessment & Plan Note (Signed)
His B12 level is adequate I plan to recheck it again and give him another injection in 6 months

## 2019-01-30 ENCOUNTER — Telehealth: Payer: Self-pay | Admitting: Hematology and Oncology

## 2019-01-30 NOTE — Telephone Encounter (Signed)
I left a message regarding schedule  

## 2019-07-28 ENCOUNTER — Other Ambulatory Visit: Payer: Self-pay

## 2019-07-28 ENCOUNTER — Inpatient Hospital Stay: Payer: Medicare Other | Attending: Hematology and Oncology

## 2019-07-28 DIAGNOSIS — E559 Vitamin D deficiency, unspecified: Secondary | ICD-10-CM

## 2019-07-28 DIAGNOSIS — Z79899 Other long term (current) drug therapy: Secondary | ICD-10-CM | POA: Diagnosis not present

## 2019-07-28 DIAGNOSIS — K922 Gastrointestinal hemorrhage, unspecified: Secondary | ICD-10-CM | POA: Insufficient documentation

## 2019-07-28 DIAGNOSIS — M549 Dorsalgia, unspecified: Secondary | ICD-10-CM | POA: Insufficient documentation

## 2019-07-28 DIAGNOSIS — D500 Iron deficiency anemia secondary to blood loss (chronic): Secondary | ICD-10-CM | POA: Insufficient documentation

## 2019-07-28 DIAGNOSIS — G8929 Other chronic pain: Secondary | ICD-10-CM | POA: Diagnosis not present

## 2019-07-28 DIAGNOSIS — Z888 Allergy status to other drugs, medicaments and biological substances status: Secondary | ICD-10-CM | POA: Diagnosis not present

## 2019-07-28 DIAGNOSIS — Z885 Allergy status to narcotic agent status: Secondary | ICD-10-CM | POA: Insufficient documentation

## 2019-07-28 DIAGNOSIS — D519 Vitamin B12 deficiency anemia, unspecified: Secondary | ICD-10-CM | POA: Insufficient documentation

## 2019-07-28 DIAGNOSIS — D518 Other vitamin B12 deficiency anemias: Secondary | ICD-10-CM

## 2019-07-28 LAB — VITAMIN B12: Vitamin B-12: 433 pg/mL (ref 180–914)

## 2019-07-28 LAB — CBC WITH DIFFERENTIAL/PLATELET
Abs Immature Granulocytes: 0.02 10*3/uL (ref 0.00–0.07)
Basophils Absolute: 0 10*3/uL (ref 0.0–0.1)
Basophils Relative: 1 %
Eosinophils Absolute: 0.2 10*3/uL (ref 0.0–0.5)
Eosinophils Relative: 2 %
HCT: 38.3 % — ABNORMAL LOW (ref 39.0–52.0)
Hemoglobin: 12.6 g/dL — ABNORMAL LOW (ref 13.0–17.0)
Immature Granulocytes: 0 %
Lymphocytes Relative: 27 %
Lymphs Abs: 2 10*3/uL (ref 0.7–4.0)
MCH: 30.7 pg (ref 26.0–34.0)
MCHC: 32.9 g/dL (ref 30.0–36.0)
MCV: 93.2 fL (ref 80.0–100.0)
Monocytes Absolute: 0.6 10*3/uL (ref 0.1–1.0)
Monocytes Relative: 9 %
Neutro Abs: 4.5 10*3/uL (ref 1.7–7.7)
Neutrophils Relative %: 61 %
Platelets: 190 10*3/uL (ref 150–400)
RBC: 4.11 MIL/uL — ABNORMAL LOW (ref 4.22–5.81)
RDW: 12.9 % (ref 11.5–15.5)
WBC: 7.3 10*3/uL (ref 4.0–10.5)
nRBC: 0 % (ref 0.0–0.2)

## 2019-07-28 LAB — IRON AND TIBC
Iron: 63 ug/dL (ref 42–163)
Saturation Ratios: 23 % (ref 20–55)
TIBC: 270 ug/dL (ref 202–409)
UIBC: 207 ug/dL (ref 117–376)

## 2019-07-28 LAB — SEDIMENTATION RATE: Sed Rate: 8 mm/hr (ref 0–16)

## 2019-07-28 LAB — VITAMIN D 25 HYDROXY (VIT D DEFICIENCY, FRACTURES): Vit D, 25-Hydroxy: 42.31 ng/mL (ref 30–100)

## 2019-07-28 LAB — FERRITIN: Ferritin: 458 ng/mL — ABNORMAL HIGH (ref 24–336)

## 2019-07-30 ENCOUNTER — Inpatient Hospital Stay: Payer: Medicare Other | Admitting: Hematology and Oncology

## 2019-07-30 ENCOUNTER — Inpatient Hospital Stay: Payer: Medicare Other

## 2019-07-30 ENCOUNTER — Telehealth: Payer: Self-pay

## 2019-07-30 NOTE — Telephone Encounter (Signed)
Actually he does not need any treatment based on labs Is he willing to do a virtual visit today just to discuss the results?

## 2019-07-30 NOTE — Telephone Encounter (Signed)
Called and left below message. Ask him to call the office back. ?

## 2019-07-30 NOTE — Telephone Encounter (Signed)
He called and left a message. He needs to cancel today's appts. Something he ate has given him diarrhea. He is requesting to reschedule to next week. Scheduling message sent to reschedule.

## 2019-07-31 ENCOUNTER — Telehealth: Payer: Self-pay | Admitting: Hematology and Oncology

## 2019-07-31 NOTE — Telephone Encounter (Signed)
Scheduled per 04/15 scheduled message, called patient and left a voicemail.

## 2019-08-03 ENCOUNTER — Telehealth: Payer: Self-pay | Admitting: Hematology and Oncology

## 2019-08-03 NOTE — Telephone Encounter (Signed)
Called patient regarding rescheduled appointment, spoke with patient's wife and he will be notified.

## 2019-08-04 ENCOUNTER — Encounter: Payer: Self-pay | Admitting: Hematology and Oncology

## 2019-08-04 ENCOUNTER — Inpatient Hospital Stay: Payer: Medicare Other

## 2019-08-04 ENCOUNTER — Other Ambulatory Visit: Payer: Self-pay

## 2019-08-04 ENCOUNTER — Inpatient Hospital Stay (HOSPITAL_BASED_OUTPATIENT_CLINIC_OR_DEPARTMENT_OTHER): Payer: Medicare Other | Admitting: Hematology and Oncology

## 2019-08-04 DIAGNOSIS — D519 Vitamin B12 deficiency anemia, unspecified: Secondary | ICD-10-CM | POA: Diagnosis not present

## 2019-08-04 DIAGNOSIS — D518 Other vitamin B12 deficiency anemias: Secondary | ICD-10-CM

## 2019-08-04 DIAGNOSIS — D500 Iron deficiency anemia secondary to blood loss (chronic): Secondary | ICD-10-CM

## 2019-08-04 MED ORDER — CYANOCOBALAMIN 1000 MCG/ML IJ SOLN
1000.0000 ug | Freq: Once | INTRAMUSCULAR | Status: AC
  Administered 2019-08-04: 10:00:00 1000 ug via INTRAMUSCULAR

## 2019-08-04 MED ORDER — CYANOCOBALAMIN 1000 MCG/ML IJ SOLN
INTRAMUSCULAR | Status: AC
  Filled 2019-08-04: qty 1

## 2019-08-04 NOTE — Assessment & Plan Note (Signed)
He continues taking oral iron supplement on a regular basis I told him to stop taking oral iron supplement because his ferritin level is high and he does not need it anymore

## 2019-08-04 NOTE — Assessment & Plan Note (Signed)
His B12 level is adequate I recommend, in the future, we could space out his B12 injections to every 6 months to a year The patient would like to change his future follow-up back to primary care doctor which I think is reasonable I have not made a return appointment for the patient to come back

## 2019-08-04 NOTE — Patient Instructions (Signed)

## 2019-08-04 NOTE — Progress Notes (Signed)
[facility] OFFICE PROGRESS NOTE  Roger Gaul, FNP  ASSESSMENT & PLAN:  B12 deficiency anemia His B12 level is adequate I recommend, in the future, we could space out his B12 injections to every 6 months to a year The patient would like to change his future follow-up back to primary care doctor which I think is reasonable I have not made a return appointment for the patient to come back  Iron deficiency anemia due to chronic blood loss He continues taking oral iron supplement on a regular basis I told him to stop taking oral iron supplement because his ferritin level is high and he does not need it anymore   No orders of the defined types were placed in this encounter.   The total time spent in the appointment was 10 minutes encounter with patients including review of chart and various tests results, discussions about plan of care and coordination of care plan   All questions were answered. The patient knows to call the clinic with any problems, questions or concerns. No barriers to learning was detected.    Roger Lark, MD 4/20/202111:28 AM  INTERVAL HISTORY: [name] 54 y.o. male returns for further follow-up on multiple mineral deficiencies The patient ruminates about his care He is not happy that morphine sulfate was taken off which he felt helped him to have normal bowel habits He did not believe he has inflammatory bowel disease He has been taking oral iron on a regular basis He gave me information of his primary care doctor and felt that in the future, he can see his primary care doctor for further blood count monitoring as well as B12 injections  SUMMARY OF HEMATOLOGIC HISTORY:  [name] is here because of recent diagnosis of severe anemia He was hospitalized in February 2018 with presentation of severe electrolyte abnormalities [name] history of small bowel perforation S/P ileocecectomy on October 2017 who required subsequent  admission for septic shock secondary to ischemic bowel (with perforation at his anastomosis site causing intra-abdominal abscesses). 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 He also has a history of chronic pain with narcotic dependence and recurrent electrolyte abnormalities. He was also found to be profoundly anemic with hemoglobin of 6.4 He received blood transfusion and iron infusion prior to hospital discharge On review of his electronic records, he was found to have profound B12 deficiency.  He was started on vitamin B12 replacement therapy.  He was also noted to have abnormal CT scan with liver lesions.  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 He has chronic back pain.  His pain is managed by pain clinic  I have reviewed the past medical history, past surgical history, social history and family history with the patient and they are unchanged from previous note.  ALLERGIES:  is allergic to celecoxib; cyclobenzaprine; dexamethasone; diazepam; etodolac; gabapentin; metaxalone; methocarbamol; pregabalin; propoxyphene; tape; tramadol; clonazepam; and latex.  MEDICATIONS:  Current Outpatient Medications  Medication Sig Dispense Refill  . acetaminophen (TYLENOL) 325 MG tablet Take 325-650 mg by mouth every 6 (six) hours as needed for headache.     . albuterol (PROVENTIL HFA;VENTOLIN HFA) 108 (90 Base) MCG/ACT inhaler Inhale 2 puffs into the lungs every 6 (six) hours as needed for wheezing or shortness of breath.    . cholecalciferol (VITAMIN D3) 25 MCG (1000 UT) tablet Take 2,000 Units by mouth daily.    . diazepam (VALIUM) 5 MG tablet Take 5 mg by mouth 3 (three)  times daily as needed.    Marland Kitchen HYDROcodone-acetaminophen (NORCO) 10-325 MG tablet Take 1 tablet by mouth 3 (three) times daily as needed.    . loperamide (IMODIUM) 2 MG capsule Take 2 capsules (4 mg total) by mouth 4 (four) times daily. 30 capsule 0  . Multiple Vitamin (MULTIVITAMIN WITH MINERALS) TABS tablet Take 1 tablet by mouth  daily.    Marland Kitchen oxymorphone (OPANA) 10 MG tablet Take 20 mg by mouth 3 (three) times daily.    . psyllium (HYDROCIL/METAMUCIL) 95 % PACK Take 2 packets by mouth 3 (three) times daily. 180 each 0  . vitamin C (ASCORBIC ACID) 500 MG tablet Take 500 mg by mouth daily.     No current facility-administered medications for this visit.     REVIEW OF SYSTEMS:  All other systems were reviewed with the patient and are negative.  PHYSICAL EXAMINATION: ECOG PERFORMANCE STATUS: 0 - Asymptomatic  Vitals:   08/04/19 0923  BP: 126/90  Pulse: 84  Resp: 18  Temp: 98.7 F (37.1 C)  SpO2: 98%   Filed Weights   08/04/19 0923  Weight: 187 lb (84.8 kg)    GENERAL:alert, no distress and comfortable LABORATORY DATA:  I have reviewed the data as listed     Component Value Date/Time   NA 143 11/07/2016 1340   K 4.1 11/07/2016 1340   CL 106 06/12/2016 0544   CO2 30 (H) 11/07/2016 1340   GLUCOSE 87 11/07/2016 1340   BUN 3.2 (L) 11/07/2016 1340   CREATININE 1.0 11/07/2016 1340   CALCIUM 10.1 11/07/2016 1340   PROT 7.9 11/07/2016 1340   ALBUMIN 4.5 11/07/2016 1340   AST 31 11/07/2016 1340   ALT 30 11/07/2016 1340   ALKPHOS 98 11/07/2016 1340   BILITOT 0.45 11/07/2016 1340   GFRNONAA >60 06/12/2016 0544   GFRAA >60 06/12/2016 0544    No results found for: SPEP, UPEP  Lab Results  Component Value Date   WBC 7.3 07/28/2019   NEUTROABS 4.5 07/28/2019   HGB 12.6 (L) 07/28/2019   HCT 38.3 (L) 07/28/2019   MCV 93.2 07/28/2019   PLT 190 07/28/2019      Chemistry      Component Value Date/Time   NA 143 11/07/2016 1340   K 4.1 11/07/2016 1340   CL 106 06/12/2016 0544   CO2 30 (H) 11/07/2016 1340   BUN 3.2 (L) 11/07/2016 1340   CREATININE 1.0 11/07/2016 1340      Component Value Date/Time   CALCIUM 10.1 11/07/2016 1340   ALKPHOS 98 11/07/2016 1340   AST 31 11/07/2016 1340   ALT 30 11/07/2016 1340   BILITOT 0.45 11/07/2016 1340

## 2019-08-05 ENCOUNTER — Telehealth: Payer: Self-pay | Admitting: Hematology and Oncology

## 2019-08-05 NOTE — Telephone Encounter (Signed)
No new orders per 4/20 los. No changes made to pt's schedule.

## 2019-08-06 ENCOUNTER — Ambulatory Visit: Payer: Medicare Other | Admitting: Hematology and Oncology

## 2019-08-06 ENCOUNTER — Ambulatory Visit: Payer: Medicare Other

## 2019-08-06 ENCOUNTER — Other Ambulatory Visit: Payer: Medicare Other

## 2019-08-10 ENCOUNTER — Ambulatory Visit: Payer: Medicare Other | Admitting: Hematology and Oncology

## 2021-03-31 ENCOUNTER — Emergency Department (HOSPITAL_COMMUNITY): Payer: No Typology Code available for payment source

## 2021-03-31 ENCOUNTER — Other Ambulatory Visit: Payer: Self-pay

## 2021-03-31 ENCOUNTER — Encounter (HOSPITAL_COMMUNITY): Payer: Self-pay | Admitting: Emergency Medicine

## 2021-03-31 ENCOUNTER — Inpatient Hospital Stay (HOSPITAL_COMMUNITY)
Admission: EM | Admit: 2021-03-31 | Discharge: 2021-04-03 | DRG: 389 | Disposition: A | Discharge status: Home / Self Care | Payer: No Typology Code available for payment source | Attending: Internal Medicine | Admitting: Internal Medicine

## 2021-03-31 DIAGNOSIS — Z20822 Contact with and (suspected) exposure to covid-19: Secondary | ICD-10-CM | POA: Diagnosis present

## 2021-03-31 DIAGNOSIS — Z91040 Latex allergy status: Secondary | ICD-10-CM

## 2021-03-31 DIAGNOSIS — E872 Acidosis, unspecified: Secondary | ICD-10-CM | POA: Diagnosis present

## 2021-03-31 DIAGNOSIS — D72829 Elevated white blood cell count, unspecified: Secondary | ICD-10-CM | POA: Diagnosis present

## 2021-03-31 DIAGNOSIS — Z888 Allergy status to other drugs, medicaments and biological substances status: Secondary | ICD-10-CM | POA: Diagnosis not present

## 2021-03-31 DIAGNOSIS — Z825 Family history of asthma and other chronic lower respiratory diseases: Secondary | ICD-10-CM | POA: Diagnosis not present

## 2021-03-31 DIAGNOSIS — Z9049 Acquired absence of other specified parts of digestive tract: Secondary | ICD-10-CM

## 2021-03-31 DIAGNOSIS — Z886 Allergy status to analgesic agent status: Secondary | ICD-10-CM

## 2021-03-31 DIAGNOSIS — Z885 Allergy status to narcotic agent status: Secondary | ICD-10-CM | POA: Diagnosis not present

## 2021-03-31 DIAGNOSIS — M549 Dorsalgia, unspecified: Secondary | ICD-10-CM | POA: Diagnosis present

## 2021-03-31 DIAGNOSIS — K565 Intestinal adhesions [bands], unspecified as to partial versus complete obstruction: Principal | ICD-10-CM | POA: Diagnosis present

## 2021-03-31 DIAGNOSIS — E860 Dehydration: Secondary | ICD-10-CM | POA: Diagnosis present

## 2021-03-31 DIAGNOSIS — Z87891 Personal history of nicotine dependence: Secondary | ICD-10-CM | POA: Diagnosis not present

## 2021-03-31 DIAGNOSIS — E876 Hypokalemia: Secondary | ICD-10-CM | POA: Diagnosis present

## 2021-03-31 DIAGNOSIS — K56609 Unspecified intestinal obstruction, unspecified as to partial versus complete obstruction: Secondary | ICD-10-CM | POA: Diagnosis present

## 2021-03-31 DIAGNOSIS — Z0189 Encounter for other specified special examinations: Secondary | ICD-10-CM

## 2021-03-31 DIAGNOSIS — Z820 Family history of epilepsy and other diseases of the nervous system: Secondary | ICD-10-CM

## 2021-03-31 DIAGNOSIS — F1690 Hallucinogen use, unspecified, uncomplicated: Secondary | ICD-10-CM | POA: Diagnosis present

## 2021-03-31 DIAGNOSIS — G8929 Other chronic pain: Secondary | ICD-10-CM | POA: Diagnosis present

## 2021-03-31 LAB — COMPREHENSIVE METABOLIC PANEL
ALT: 16 U/L (ref 0–44)
AST: 27 U/L (ref 15–41)
Albumin: 4.7 g/dL (ref 3.5–5.0)
Alkaline Phosphatase: 72 U/L (ref 38–126)
Anion gap: 12 (ref 5–15)
BUN: 11 mg/dL (ref 6–20)
CO2: 28 mmol/L (ref 22–32)
Calcium: 10.1 mg/dL (ref 8.9–10.3)
Chloride: 97 mmol/L — ABNORMAL LOW (ref 98–111)
Creatinine, Ser: 1.07 mg/dL (ref 0.61–1.24)
GFR, Estimated: >60 mL/min (ref >60)
Glucose, Bld: 156 mg/dL — ABNORMAL HIGH (ref 70–99)
Potassium: 3.2 mmol/L — ABNORMAL LOW (ref 3.5–5.1)
Sodium: 137 mmol/L (ref 135–145)
Total Bilirubin: 0.6 mg/dL (ref 0.3–1.2)
Total Protein: 7.9 g/dL (ref 6.5–8.1)

## 2021-03-31 LAB — CBC
HCT: 42 % (ref 39.0–52.0)
Hemoglobin: 13.8 g/dL (ref 13.0–17.0)
MCH: 30.2 pg (ref 26.0–34.0)
MCHC: 32.9 g/dL (ref 30.0–36.0)
MCV: 91.9 fL (ref 80.0–100.0)
Platelets: 277 10*3/uL (ref 150–400)
RBC: 4.57 MIL/uL (ref 4.22–5.81)
RDW: 12 % (ref 11.5–15.5)
WBC: 21.8 10*3/uL — ABNORMAL HIGH (ref 4.0–10.5)
nRBC: 0 % (ref 0.0–0.2)

## 2021-03-31 LAB — LACTIC ACID, PLASMA
Lactic Acid, Venous: 3.2 mmol/L (ref 0.5–1.9)
Lactic Acid, Venous: 1.4 mmol/L (ref 0.5–1.9)

## 2021-03-31 LAB — RESP PANEL BY RT-PCR (FLU A&B, COVID) ARPGX2
Influenza A by PCR: NEGATIVE
Influenza B by PCR: NEGATIVE
SARS Coronavirus 2 by RT PCR: NEGATIVE

## 2021-03-31 LAB — LIPASE, BLOOD: Lipase: 31 U/L (ref 11–51)

## 2021-03-31 MED ORDER — POTASSIUM CHLORIDE 10 MEQ/100ML IV SOLN
10.0000 meq | Freq: Once | INTRAVENOUS | Status: AC
  Administered 2021-03-31: 10 meq via INTRAVENOUS
  Filled 2021-03-31: qty 100

## 2021-03-31 MED ORDER — LACTATED RINGERS IV SOLN: INTRAVENOUS | Status: DC

## 2021-03-31 MED ORDER — DIATRIZOATE MEGLUMINE & SODIUM 66-10 % PO SOLN
90.0000 mL | Freq: Once | ORAL | Status: AC
  Administered 2021-04-01: 90 mL via NASOGASTRIC
  Filled 2021-03-31: qty 90

## 2021-03-31 MED ORDER — IOHEXOL 300 MG/ML  SOLN
100.0000 mL | Freq: Once | INTRAMUSCULAR | Status: AC
  Administered 2021-03-31: 100 mL via INTRAVENOUS

## 2021-03-31 MED ORDER — MORPHINE SULFATE (PF) 4 MG/ML IV SOLN
4.0000 mg | Freq: Once | INTRAVENOUS | Status: AC
  Administered 2021-03-31: 4 mg via INTRAMUSCULAR
  Filled 2021-03-31: qty 1

## 2021-03-31 MED ORDER — PIPERACILLIN-TAZOBACTAM 3.375 G IVPB 30 MIN
3.3750 g | Freq: Once | INTRAVENOUS | Status: AC
  Administered 2021-03-31: 3.375 g via INTRAVENOUS
  Filled 2021-03-31: qty 50

## 2021-03-31 MED ORDER — MORPHINE SULFATE (PF) 4 MG/ML IV SOLN
4.0000 mg | Freq: Once | INTRAVENOUS | Status: AC
  Administered 2021-03-31: 4 mg via INTRAVENOUS
  Filled 2021-03-31: qty 1

## 2021-03-31 MED ORDER — MORPHINE SULFATE (PF) 4 MG/ML IV SOLN
4.0000 mg | Freq: Once | INTRAVENOUS | Status: AC
  Administered 2021-03-31: 4 mg via INTRAVENOUS

## 2021-03-31 MED ORDER — ENOXAPARIN SODIUM 40 MG/0.4ML IJ SOSY
40.0000 mg | PREFILLED_SYRINGE | INTRAMUSCULAR | Status: DC
  Administered 2021-03-31 – 2021-04-01 (×2): 40 mg via SUBCUTANEOUS
  Filled 2021-03-31 (×2): qty 0.4

## 2021-03-31 MED ORDER — MORPHINE SULFATE (PF) 4 MG/ML IV SOLN
4.0000 mg | INTRAVENOUS | Status: DC | PRN
  Administered 2021-03-31 – 2021-04-01 (×3): 4 mg via INTRAVENOUS
  Filled 2021-03-31 (×4): qty 1

## 2021-03-31 MED ORDER — ONDANSETRON HCL 4 MG/2ML IJ SOLN
4.0000 mg | Freq: Once | INTRAMUSCULAR | Status: AC
  Administered 2021-03-31: 4 mg via INTRAVENOUS
  Filled 2021-03-31: qty 2

## 2021-03-31 MED ORDER — POTASSIUM CHLORIDE 10 MEQ/100ML IV SOLN
10.0000 meq | INTRAVENOUS | Status: AC
  Administered 2021-03-31: 10 meq via INTRAVENOUS
  Filled 2021-03-31: qty 100

## 2021-03-31 NOTE — H&P (Addendum)
Date: 03/31/2021               Patient Name:  BAKARI NIKOLAI MRN: [id number]  DOB: 1965/09/18 Age / Sex: 55 y.o., male   PCP: Timoteo Gaul, FNP         Medical Service: Internal Medicine Teaching Service         Attending Physician: Dr. Aldine Contes    First Contact: Dr. Corky Sox Pager: [phone]  Second Contact: Dr. 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  [name] past surgical history includes a perforation of small bowel in 2017, for which the patient had a small bowel resection, ileocecectomy, and ileostomy. He had an ileostomy takedown in 2018. The patient reports being in a good state of health since that time.   The patient states that abdominal pressure started yesterday. He was able to pass a partial BM but felt like he could not pass the full BM. He was worried about a complication from his surgeries and gave himself a laxative and water enema, without success in resolving the abdominal discomfort. He was able to eat until lunchtime yesterday, when he vomited. He denies fever/chills.   Meds: Occasionally uses Benadryl for sleep    Allergies: Allergies as of 03/31/2021 - Review Complete 03/31/2021  Allergen Reaction Noted   Celecoxib Other (See Comments) 09/28/2013   Cyclobenzaprine Other (See Comments) 09/28/2013   Dexamethasone Other (See Comments) 09/28/2013   Diazepam Other (See Comments) 09/28/2013   Etodolac Other (See Comments) 09/28/2013   Gabapentin Other (See Comments) 09/28/2013   Metaxalone Other (See Comments) 09/28/2013   Methocarbamol Other (See Comments) 09/28/2013   Pregabalin Other (See  Comments) 09/28/2013   Propoxyphene Other (See Comments) 09/28/2013   Tape Rash and Other (See Comments) 06/05/2016   Tramadol Other (See Comments) 09/28/2013   Wound dressing adhesive Rash and Other (See Comments) 06/05/2016   Clonazepam Itching 01/21/2014   Aspirin Other (See Comments) 03/31/2021   Motrin [ibuprofen] Other (See Comments) 03/31/2021   Oxycodone Other (See Comments) 03/31/2021   Latex Itching and Rash 05/30/2016   Past Medical History:  Diagnosis Date   Anxiety    panic attacks   Asthma    Complication of anesthesia    " I got confused "   Foot drop, left    Headache    Migraine   History of blood transfusion    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 Independent for ADLs/iADLS. Reports previous heavy tobacco use, 3-4 ppd.  Reports previous heavy drinking Hx, but denies recent use. Reports using mushrooms for nerve pain, no other illegal drugs.    Review of Systems: A complete ROS was negative except as per HPI.   Physical Exam: Blood pressure 125/68, pulse 72, temperature 97.8 F (36.6 C), temperature source Oral, resp. rate 20, SpO2 94 %.  Physical Exam Vitals reviewed.  Constitutional:      Appearance: He is not ill-appearing.  HENT:     Head: Normocephalic and atraumatic.     Nose:     Comments: NG tube in place, feculent material in canister Cardiovascular:     Rate and Rhythm: Normal rate and regular rhythm.     Heart sounds: Normal heart sounds. No murmur heard. Pulmonary:     Effort: Pulmonary effort is normal.     Breath sounds: Normal breath sounds.  Abdominal:     Comments: ABD slightly distended TTP in the lower quadrants, with some voluntary guarding,  Does not appear peritonitic   Normoactive bowel sounds  Skin:    General: Skin is warm and dry.  Neurological:     Mental Status: He is alert and oriented to person, place, and time.  Psychiatric:     Comments: Tangential thought process, difficult to interrupt     EKG: personally reviewed my interpretation is not obtained  CXR: personally reviewed my interpretation is no acute cardiopulmonary abnormality.  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   #Small bowel obstruction Patient with Hx of perforation of small bowel in 2017, for which the patient had a small bowel resection, ileocecectomy, and ileostomy with subsequent ileostomy takedown in 2018. CT A/P notable for an SBO with a transition point just proximal to the coloenteric anastomosis. Surgery desires to manage his SBO conservatively given his previous surgeries. Patient does have a leukocytosis to 22 and lactate of 3.2. Do not suspect this is due to infection, but is rather reactive and due to dehydration.  -NG tube to suction -NPO -Gastrografin study -LR at 100 mL/hr x20 hrs -Morphine 4 mg q4hrs PRN -Reassess need for ABX tomorrow, S/p Zosyn x1 -F/u Bcx   #Hypokalemia K of 3.2. -Replace with 2 runs of IV Kcl -Recheck BMP in the AM   FEN: NPO, LR at 100 mL/hr x20 hrs VTE: Lovenox  Code status: FULL Dispo: likely home   Dispo: Admit patient to Inpatient with expected length of stay greater than 2 midnights.  Signed: Corky Sox, MD PGY-1 Pager: [phone] After 5pm on weekdays and 1pm on weekends: On Call pager: [phone]

## 2021-03-31 NOTE — Hospital Course (Addendum)
Lives in a trailer with spouse. Used to be a heavy alcohol and cigarette user. Now drinks occasionally. Used to smoke 3-4 packs/day (unknown amount of time). Denies any recreational drug use now, but has used mushrooms in the past for "nerve" issues.   Spoke with wife, [name].   3 back surgeries with Dr. 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 Was tried on low dose valium in the past.   New PCP: [facility] _________________________________

## 2021-03-31 NOTE — ED Triage Notes (Signed)
Patient with history of multiple bowel surgeries here with complaint of constipation for the last several days. Patient states he feels like his intestines are about to rupture. Patient alert and oriented at this time.

## 2021-03-31 NOTE — ED Provider Notes (Signed)
[facility] EMERGENCY DEPARTMENT Provider Note   CSN: [id number] Arrival date & time: 03/31/21  2841     History Chief Complaint  Patient presents with   Abdominal Pain    [name] is a 55 y.o. male who presents to the ED today with complaint of gradual onset, constant, sharp, severe, 10/10, diffuse abdominal pain that began around 11 PM last night.  Patient reports history of multiple bowel surgeries including resections secondary to bowel rupture.  He reports this feels similar.  He does report that he had diarrhea yesterday however he varies from diarrhea to constipation which is typical for him.  He states that he vomited x1 at home.  Denies any coffee-ground emesis or hematemesis.  He is continually burping now and tastes acid in his mouth.  Denies any fevers or chills at home.   The history is provided by the patient, medical records and a significant other.      Past Medical History:  Diagnosis Date   Anxiety    panic attacks   Asthma    Complication of anesthesia    " I got confused "   Foot drop, left    Headache    Migraine   History of blood transfusion    Ischemic bowel disease ([place])    Pneumonia    in past   Septic shock ([place]) 01/2016   Small bowel perforation ([place]) 01/2016    Patient Active Problem List   Diagnosis Date Noted   Other fatigue 11/08/2016   Liver hemangioma 08/14/2016   Vitamin D deficiency 07/18/2016   B12 deficiency anemia 07/04/2016   Iron deficiency anemia due to chronic blood loss 07/04/2016   Malnutrition of moderate degree 06/07/2016   Hyponatremia 06/06/2016   Abnormal CT of the abdomen 06/06/2016   Hypokalemia 06/05/2016   Ischemic bowel disease ([place])    Bowel perforation ([place])    Abdominal abscess    Pleural effusion    Acute kidney injury ([place])    Sepsis due to Escherichia coli ([place])    Sepsis ([place])    Abdominal infection ([place]) 02/08/2016   Peritonitis ([place])    Small bowel perforation ([place])  01/27/2016    Past Surgical History:  Procedure Laterality Date   APPLICATION OF WOUND VAC N/A 02/08/2016   Procedure: APPLICATION OF WOUND VAC;  Surgeon: Judeth Horn, MD;  Location: [facility];  Service: General;  Laterality: N/A;   BACK SURGERY     x3   BOWEL RESECTION N/A 01/27/2016   Procedure: SMALL BOWEL RESECTION;  Surgeon: Greer Pickerel, MD;  Location: [facility];  Service: General;  Laterality: N/A;   EXPLORATORY LAPAROTOMY  01/27/2016   ILEOCECETOMY N/A 02/08/2016   Procedure: Pennie Rushing;  Surgeon: Judeth Horn, MD;  Location: [facility];  Service: General;  Laterality: N/A;   ILEOSTOMY N/A 02/10/2016   Procedure: ILEOSTOMY;  Surgeon: Judeth Horn, MD;  Location: [place];  Service: General;  Laterality: N/A;   ILEOSTOMY CLOSURE N/A 06/07/2016   Procedure: ILEOSTOMY TAKEDOWN;  Surgeon: Judeth Horn, MD;  Location: [facility];  Service: General;  Laterality: N/A;   LAPAROTOMY N/A 01/27/2016   Procedure: EXPLORATORY LAPAROTOMY;  Surgeon: Greer Pickerel, MD;  [facility];  Service: General;  Laterality: N/A;   LAPAROTOMY N/A 02/08/2016   Procedure: EXPLORATORY LAPAROTOMY;  Surgeon: Judeth Horn, MD;  Location: [facility] OR;  Service: General;  Laterality: N/A;   LAPAROTOMY N/A 02/10/2016   Procedure: EXPLORATORY LAPAROTOMY;  Surgeon: Judeth Horn, MD;  Location:  [facility] OR;  Service: General;  Laterality: N/A;   VACUUM ASSISTED CLOSURE CHANGE N/A 02/12/2016   Procedure: EXPLORATORY LAPAROTOMY WITH CLOSURE;  Surgeon: Erroll Luna, MD;  Location: [facility];  Service: General;  Laterality: N/A;       History reviewed. No pertinent family history.  Social History   Tobacco Use   Smoking status: Former    Packs/day: 0.50    Years: 34.00    Pack years: 17.00    Types: Cigarettes    Quit date: 01/27/2016    Years since quitting: 5.1   Smokeless tobacco: Never  Substance Use Topics   Alcohol use: No   Drug use: No    Home Medications Prior to Admission medications   Medication Sig Start Date End Date Taking?  Authorizing Provider  acetaminophen (TYLENOL) 500 MG tablet Take 500-1,000 mg by mouth every 6 (six) hours as needed for mild pain.   Yes [provider]  BENADRYL ALLERGY 25 MG capsule Take 25-50 mg by mouth at bedtime as needed for sleep.   Yes [provider]  melatonin 5 MG TABS Take 25-30 mg by mouth at bedtime as needed (for sleep).   Yes [provider]  ZZZQUIL 50 MG/30ML LIQD Take 15-30 mLs by mouth at bedtime as needed (for sleep).   Yes [provider]  albuterol (PROVENTIL HFA;VENTOLIN HFA) 108 (90 Base) MCG/ACT inhaler Inhale 2 puffs into the lungs every 6 (six) hours as needed for wheezing or shortness of breath. Patient not taking: Reported on 03/31/2021    [provider]  loperamide (IMODIUM) 2 MG capsule Take 2 capsules (4 mg total) by mouth 4 (four) times daily. Patient not taking: Reported on 03/31/2021 06/12/16   Judeth Horn, MD  psyllium (HYDROCIL/METAMUCIL) 95 % PACK Take 2 packets by mouth 3 (three) times daily. Patient not taking: Reported on 03/31/2021 03/04/16   Cristal Ford, DO    Allergies    Celecoxib, Cyclobenzaprine, Dexamethasone, Diazepam, Etodolac, Gabapentin, Metaxalone, Methocarbamol, Pregabalin, Propoxyphene, Tape, Tramadol, Wound dressing adhesive, Clonazepam, Aspirin, Motrin [ibuprofen], Oxycodone, and Latex  Review of Systems   Review of Systems  Constitutional:  Negative for chills and fever.  Respiratory:  Negative for shortness of breath.   Cardiovascular:  Negative for chest pain.  Gastrointestinal:  Positive for abdominal pain, diarrhea, nausea and vomiting.  Genitourinary:  Negative for difficulty urinating.  All other systems reviewed and are negative.  Physical Exam Updated Vital Signs BP 125/68    Pulse 72    Temp 97.8 F (36.6 C) (Oral)    Resp 20    SpO2 94%   Physical Exam Vitals and nursing note reviewed.  Constitutional:      Appearance: He is not ill-appearing or diaphoretic.   HENT:     Head: Normocephalic and atraumatic.  Eyes:     Conjunctiva/sclera: Conjunctivae normal.  Cardiovascular:     Rate and Rhythm: Normal rate and regular rhythm.     Heart sounds: Normal heart sounds.  Pulmonary:     Effort: Pulmonary effort is normal.     Breath sounds: Normal breath sounds. No wheezing, rhonchi or rales.  Abdominal:     General: Abdomen is flat. Bowel sounds are decreased.     Palpations: Abdomen is rigid.     Tenderness: There is abdominal tenderness. There is guarding (voluntary). There is no rebound.  Musculoskeletal:     Cervical back: Neck supple.  Skin:    General: Skin is warm and dry.  Neurological:  Mental Status: He is alert.    ED Results / Procedures / Treatments   Labs (all labs ordered are listed, but only abnormal results are displayed) Labs Reviewed  COMPREHENSIVE METABOLIC PANEL - Abnormal; Notable for the following components:      Result Value   Potassium 3.2 (*)    Chloride 97 (*)    Glucose, Bld 156 (*)    All other components within normal limits  CBC - Abnormal; Notable for the following components:   WBC 21.8 (*)    All other components within normal limits  LACTIC ACID, PLASMA - Abnormal; Notable for the following components:   Lactic Acid, Venous 3.2 (*)    All other components within normal limits  RESP PANEL BY RT-PCR (FLU A&B, COVID) ARPGX2  CULTURE, BLOOD (ROUTINE X 2)  CULTURE, BLOOD (ROUTINE X 2)  LIPASE, BLOOD  URINALYSIS, ROUTINE W REFLEX MICROSCOPIC  LACTIC ACID, PLASMA    EKG None  Radiology CT Abdomen Pelvis W Contrast  Result Date: 03/31/2021 CLINICAL DATA:  Severe abdominal pain. History of bowel obstruction and numerous abdominal surgeries. EXAM: CT ABDOMEN AND PELVIS WITH CONTRAST TECHNIQUE: Multidetector CT imaging of the abdomen and pelvis was performed using the standard protocol following bolus administration of intravenous contrast. CONTRAST:  152mL OMNIPAQUE IOHEXOL 300 MG/ML  SOLN  COMPARISON:  MRI abdomen dated October 26, 2016. CT abdomen and pelvis dated June 05, 2016. FINDINGS: Lower chest: No acute abnormality. Unchanged scarring at the left lung base. Hepatobiliary: Multiple hepatic hemangiomas are unchanged. No new focal liver abnormality. The gallbladder is unremarkable. No biliary dilatation. Pancreas: Unremarkable. No pancreatic ductal dilatation or surrounding inflammatory changes. Spleen: Normal in size without focal abnormality. Adrenals/Urinary Tract: The adrenal glands and left kidney are unremarkable. New small nonobstructive calculi in the lower pole the right kidney. No hydronephrosis. The bladder is unremarkable. Stomach/Bowel: Prior right hemicolectomy with interval ileostomy takedown. Right upper quadrant anastomosis. Multiple dilated loops of mid and distal small bowel with transition point in the right upper quadrant where there is mildly thickened ileum just proximal to the coloenteric anastomosis (series 3, image 41; series 6, image 78). No pneumatosis. The colon is largely decompressed. The stomach is within normal limits. Vascular/Lymphatic: Aortic atherosclerosis. No enlarged abdominal or pelvic lymph nodes. Reproductive: Prostate is unremarkable. Other: Small amount of interloop ascites associated with the dilated small bowel. No fluid collection or pneumoperitoneum. Musculoskeletal: No acute or significant osseous findings. IMPRESSION: 1. Prior right hemicolectomy with interval ileostomy takedown. Small-bowel obstruction with transition point just proximal to the coloenteric anastomosis. No perforation or abscess. 2. New nonobstructive right nephrolithiasis. 3. Aortic Atherosclerosis (ICD10-I70.0). Electronically Signed   By: Titus Dubin M.D.   On: 03/31/2021 11:59   DG Chest Portable 1 View  Result Date: 03/31/2021 CLINICAL DATA:  Rule out free air EXAM: PORTABLE CHEST 1 VIEW COMPARISON:  06/04/2016 FINDINGS: The heart size and mediastinal contours are  within normal limits. Both lungs are clear. Bridging fusion of the lower thoracic spine. No free air in the included upper abdomen. IMPRESSION: 1.  No acute abnormality of the lungs in AP portable projection. 2.  No free air in the included upper abdomen. Electronically Signed   By: Delanna Ahmadi M.D.   On: 03/31/2021 11:21    Procedures .Critical Care Performed by: Eustaquio Maize, PA-C Authorized by: Eustaquio Maize, PA-C   Critical care provider statement:    Critical care time (minutes):  45   Critical care was time spent personally by me  on the following activities:  Development of treatment plan with patient or surrogate, discussions with consultants, evaluation of patient's response to treatment, examination of patient, ordering and review of laboratory studies, ordering and review of radiographic studies, ordering and performing treatments and interventions, pulse oximetry, re-evaluation of patient's condition and review of old charts   Medications Ordered in ED Medications  diatrizoate meglumine-sodium (GASTROGRAFIN) 66-10 % solution 90 mL (has no administration in time range)  ondansetron (ZOFRAN) injection 4 mg (4 mg Intravenous Given 03/31/21 1156)  morphine 4 MG/ML injection 4 mg (4 mg Intravenous Given 03/31/21 1056)  piperacillin-tazobactam (ZOSYN) IVPB 3.375 g (0 g Intravenous Stopped 03/31/21 1303)  morphine 4 MG/ML injection 4 mg (4 mg Intramuscular Given 03/31/21 1157)  potassium chloride 10 mEq in 100 mL IVPB (0 mEq Intravenous Stopped 03/31/21 1321)  iohexol (OMNIPAQUE) 300 MG/ML solution 100 mL (100 mLs Intravenous Contrast Given 03/31/21 1129)    ED Course  I have reviewed the triage vital signs and the nursing notes.  Pertinent labs & imaging results that were available during my care of the patient were reviewed by me and considered in my medical decision making (see chart for details).    MDM Rules/Calculators/A&P                          55 year old male who  presents to the ED today with complaint of severe diffuse abdominal pain that began last night.  History of multiple bowel surgeries/resections with most recent in 2017.  On arrival to the ED today blood pressure slightly soft at 103/64.  Remainder vitals unremarkable.  He is writhing around in bed moaning in pain.  His abdomen is hard and diffusely tender to palpation with voluntary guarding.  He does have decreased bowel sounds throughout.  Labs have been ordered in the waiting room -currently pending.  Patient will need CT scan.  Will provide pain medication and antiemetics and plan for COVID testing as well with concern for intra-abdominal complication.   CBC has returned with an elevated white blood cell count of 21,800 it.  We will start on Zosyn and obtain blood cultures at this time.  Lactic acid added.   CMP with potassium 3.2; repleted in the ED. No other electrolyte abnormalities.  Lipase 31.   CXR without signs of perforation.  Discussed case with general surgery team - will await consulting until after CT scan returns.   CT scan: IMPRESSION:  1. Prior right hemicolectomy with interval ileostomy takedown.  Small-bowel obstruction with transition point just proximal to the  coloenteric anastomosis. No perforation or abscess.  2. New nonobstructive right nephrolithiasis.  3. Aortic Atherosclerosis (ICD10-I70.0).   General surgery has evaluated patient; recommends NG tube and medicine admission at this time.   Discussed with Internal Medicine who agrees to evaluate patient for admission.   This note was prepared using Dragon voice recognition software and may include unintentional dictation errors due to the inherent limitations of voice recognition software.       Final Clinical Impression(s) / ED Diagnoses Final diagnoses:  Small bowel obstruction [facility])    Rx / DC Orders ED Discharge Orders     None        Eustaquio Maize, PA-C 03/31/21 1414    Teressa Lower,  MD 03/31/21 [phone]

## 2021-03-31 NOTE — Progress Notes (Signed)
Admitted to [facility]. Via stretcher from ER. Pt arrived with ngt 14 french right nostril secured to intermitent low wall suction drainage green/brown liquid. Iv left aC site good. Alert/oriented x 3 .

## 2021-03-31 NOTE — Consult Note (Signed)
[name] 04-24-1965  [id number].    Requesting MD: Dr. 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  In 2018 he underwent ileostomy takedown.  He has had no further abdominal issues since that time.  He states last night he developed some mild abdominal pain as well as some nausea and subsequent vomiting.  He took his metamucil and gave himself an enema, each to no avail of his abdominal pain.  He had had a BM yesterday prior to this that was more loose in nature. His pain persisted and worsened.  This morning he vomited at home and decided to present to the ED secondary to abdominal pain.    Upon arrival here he was noted to have a WBC of 21.8 and K of 3.2.  all other labs were relatively unremarkable.  He underwent a CT scan of his abdomen and pelvis that revealed a small bowel obstruction to the level of his anastomosis.  We have been asked to see the patient for further evaluation.  ROS: ROS: Please see HPI, otherwise all other systems have been reviewed and are negative.  History reviewed. No pertinent family history.  Past Medical History:  Diagnosis Date   Anxiety    panic attacks   Asthma    Complication of anesthesia    " I got confused "   Foot drop, left    Headache    Migraine   History of blood transfusion    Ischemic bowel disease ()    Pneumonia    in past   Septic shock ([place]) 01/2016   Small bowel perforation ([place]) 01/2016    Past Surgical History:  Procedure Laterality Date   APPLICATION OF WOUND VAC N/A 02/08/2016   Procedure: APPLICATION OF WOUND VAC;  Surgeon: Judeth Horn, MD;  Location: [place];  Service: General;  Laterality: N/A;   BACK SURGERY     x3   BOWEL RESECTION N/A 01/27/2016   Procedure: SMALL BOWEL RESECTION;  Surgeon: Greer Pickerel, MD;  Location: [place];  Service: General;  Laterality: N/A;   EXPLORATORY LAPAROTOMY  01/27/2016   ILEOCECETOMY N/A 02/08/2016   Procedure: Pennie Rushing;  Surgeon: Judeth Horn, MD;  Location: [place];  Service: General;  Laterality: N/A;   ILEOSTOMY N/A 02/10/2016   Procedure: ILEOSTOMY;  Surgeon: Judeth Horn, MD;  Location: [place];  Service: General;  Laterality: N/A;   ILEOSTOMY CLOSURE N/A 06/07/2016   Procedure: ILEOSTOMY TAKEDOWN;  Surgeon: Judeth Horn, MD;  Location: [facility] OR;  Service: General;  Laterality: N/A;   LAPAROTOMY N/A 01/27/2016   Procedure: EXPLORATORY LAPAROTOMY;  Surgeon: Greer Pickerel, MD;  Location: [facility] OR;  Service: General;  Laterality: N/A;   LAPAROTOMY N/A 02/08/2016   Procedure: EXPLORATORY LAPAROTOMY;  Surgeon: Judeth Horn, MD;  Location: [facility] OR;  Service: General;  Laterality: N/A;   LAPAROTOMY N/A 02/10/2016   Procedure: EXPLORATORY LAPAROTOMY;  Surgeon: Judeth Horn, MD;  Location: [place];  Service: General;  Laterality: N/A;   VACUUM ASSISTED CLOSURE CHANGE N/A 02/12/2016   Procedure: EXPLORATORY LAPAROTOMY WITH CLOSURE;  Surgeon: Erroll Luna, MD;  Location: [place];  Service: General;  Laterality: N/A;  Social History:  reports that he quit smoking about 5 years ago. His smoking use included cigarettes. He has a 17.00 pack-year smoking history. He has never used smokeless tobacco. He reports that he does not drink alcohol and does not use drugs.  Allergies:  Allergies  Allergen Reactions   Celecoxib Other (See Comments)    IRRITABILITY    Cyclobenzaprine Other (See Comments)    IRRITABILITY   Dexamethasone Other (See Comments)    IRRITABILITY    Diazepam Other (See Comments)    IRRITABILITY   Etodolac Other (See Comments)    IRRITABILITY   Gabapentin Other (See Comments)     IRRITABILITY   Metaxalone Other (See Comments)    IRRITABILITY (Skelaxin)   Methocarbamol Other (See Comments)    IRRITABILITY   Pregabalin Other (See Comments)    Aggression and irritability   Propoxyphene Other (See Comments)    IRRITABILITY     Tape Rash and Other (See Comments)    PLASTIC TAPE TAKES OFF 1-2 LAYERS OF [name] SKIN   Tramadol Other (See Comments)    IRRITABILITY   Wound Dressing Adhesive Rash and Other (See Comments)    PLASTIC TAPE TAKES OFF 1-2 LAYERS OF [name] SKIN, also   Clonazepam Itching   Aspirin Other (See Comments)    Cannot take this again- affects bowels- Tylenol ONLY   Motrin [Ibuprofen] Other (See Comments)    Cannot take this again- affects bowels- Tylenol ONLY   Oxycodone Other (See Comments)    Made the patient feel "uncomfortable"   Latex Itching and Rash    (Not in a hospital admission)    Physical Exam: Blood pressure 127/70, pulse 77, temperature 97.8 F (36.6 C), temperature source Oral, resp. rate 18, SpO2 95 %. General: pleasant, WD, WN white male who is laying in bed in NAD HEENT: head is normocephalic, atraumatic.  Sclera are noninjected.  PERRL.  Ears and nose without any masses or lesions.  Mouth is pink and dry Heart: regular, rate, and rhythm.  Normal s1,s2. No obvious murmurs, gallops, or rubs noted.  Palpable radial and pedal pulses bilaterally Lungs: CTAB, no wheezes, rhonchi, or rales noted.  Respiratory effort nonlabored Abd: soft, focally tender in RLQ, but no guarding or rebounding noted, mild distention, +BS, no masses, hernias, or organomegaly, but multiple scars noted from previous surgeries MS: all 4 extremities are symmetrical with no cyanosis, clubbing, or edema. Skin: warm and dry with no masses, lesions, or rashes Neuro: Cranial nerves 2-12 grossly intact, sensation is normal throughout Psych: A&Ox3 with an appropriate affect.   Results for orders placed or performed during the hospital encounter of  03/31/21 (from the past 48 hour(s))  Lipase, blood     Status: None   Collection Time: 03/31/21  9:26 AM  Result Value Ref Range   Lipase 31 11 - 51 U/L    Comment: Performed at [facility], [place] [address], [place],  [address]  Comprehensive metabolic panel     Status: Abnormal   Collection Time: 03/31/21  9:26 AM  Result Value Ref Range   Sodium 137 135 - 145 mmol/L   Potassium 3.2 (L) 3.5 - 5.1 mmol/L   Chloride 97 (L) 98 - 111 mmol/L   CO2 28 22 - 32 mmol/L   Glucose, Bld 156 (H) 70 - 99 mg/dL    Comment: Glucose reference range applies only to samples taken after fasting for at least 8 hours.   BUN 11 6 - 20  mg/dL   Creatinine, Ser 1.07 0.61 - 1.24 mg/dL   Calcium 10.1 8.9 - 10.3 mg/dL   Total Protein 7.9 6.5 - 8.1 g/dL   Albumin 4.7 3.5 - 5.0 g/dL   AST 27 15 - 41 U/L   ALT 16 0 - 44 U/L   Alkaline Phosphatase 72 38 - 126 U/L   Total Bilirubin 0.6 0.3 - 1.2 mg/dL   GFR, Estimated >60 >60 mL/min    Comment: (NOTE) Calculated using the CKD-EPI Creatinine Equation (2021)    Anion gap 12 5 - 15    Comment: Performed at [place] [address], [place], [place] [address]  CBC     Status: Abnormal   Collection Time: 03/31/21  9:26 AM  Result Value Ref Range   WBC 21.8 (H) 4.0 - 10.5 K/uL   RBC 4.57 4.22 - 5.81 MIL/uL   Hemoglobin 13.8 13.0 - 17.0 g/dL   HCT 42.0 39.0 - 52.0 %   MCV 91.9 80.0 - 100.0 fL   MCH 30.2 26.0 - 34.0 pg   MCHC 32.9 30.0 - 36.0 g/dL   RDW 12.0 11.5 - 15.5 %   Platelets 277 150 - 400 K/uL   nRBC 0.0 0.0 - 0.2 %    Comment: Performed at [facility], [place] [address], [place], [place] [address]  Lactic acid, plasma     Status: Abnormal   Collection Time: 03/31/21 11:48 AM  Result Value Ref Range   Lactic Acid, Venous 3.2 (HH) 0.5 - 1.9 mmol/L    Comment: CRITICAL RESULT CALLED TO, READ BACK BY AND VERIFIED WITH: V.MULLIS RN 1305 03/31/21 M[place]ORMICK K Performed at [facility], [place] [address] [place], [place]  [address]    CT Abdomen Pelvis W Contrast  Result Date: 03/31/2021 CLINICAL DATA:  Severe abdominal pain. History of bowel obstruction and numerous abdominal surgeries. EXAM: CT ABDOMEN AND PELVIS WITH CONTRAST TECHNIQUE: Multidetector CT imaging of the abdomen and pelvis was performed using the standard protocol following bolus administration of intravenous contrast. CONTRAST:  165mL OMNIPAQUE IOHEXOL 300 MG/ML  SOLN COMPARISON:  MRI abdomen dated October 26, 2016. CT abdomen and pelvis dated June 05, 2016. FINDINGS: Lower chest: No acute abnormality. Unchanged scarring at the left lung base. Hepatobiliary: Multiple hepatic hemangiomas are unchanged. No new focal liver abnormality. The gallbladder is unremarkable. No biliary dilatation. Pancreas: Unremarkable. No pancreatic ductal dilatation or surrounding inflammatory changes. Spleen: Normal in size without focal abnormality. Adrenals/Urinary Tract: The adrenal glands and left kidney are unremarkable. New small nonobstructive calculi in the lower pole the right kidney. No hydronephrosis. The bladder is unremarkable. Stomach/Bowel: Prior right hemicolectomy with interval ileostomy takedown. Right upper quadrant anastomosis. Multiple dilated loops of mid and distal small bowel with transition point in the right upper quadrant where there is mildly thickened ileum just proximal to the coloenteric anastomosis (series 3, image 41; series 6, image 78). No pneumatosis. The colon is largely decompressed. The stomach is within normal limits. Vascular/Lymphatic: Aortic atherosclerosis. No enlarged abdominal or pelvic lymph nodes. Reproductive: Prostate is unremarkable. Other: Small amount of interloop ascites associated with the dilated small bowel. No fluid collection or pneumoperitoneum. Musculoskeletal: No acute or significant osseous findings. IMPRESSION: 1. Prior right hemicolectomy with interval ileostomy takedown. Small-bowel obstruction with transition point just  proximal to the coloenteric anastomosis. No perforation or abscess. 2. New nonobstructive right nephrolithiasis. 3. Aortic Atherosclerosis (ICD10-I70.0). Electronically Signed   By: Titus Dubin M.D.   On:  03/31/2021 11:59   DG Chest Portable 1 View  Result Date: 03/31/2021 CLINICAL DATA:  Rule out free air EXAM: PORTABLE CHEST 1 VIEW COMPARISON:  06/04/2016 FINDINGS: The heart size and mediastinal contours are within normal limits. Both lungs are clear. Bridging fusion of the lower thoracic spine. No free air in the included upper abdomen. IMPRESSION: 1.  No acute abnormality of the lungs in AP portable projection. 2.  No free air in the included upper abdomen. Electronically Signed   By: Delanna Ahmadi M.D.   On: 03/31/2021 11:21      Assessment/Plan SBO The patient appears to have a SBO on his imaging.  All of his imaging as well as his labs and his history have been thoroughly reviewed.  He likely has an adhesive obstruction vs some functional narrowing at his anastomosis.  We would recommend medication admission and initiation of SBO protocol.  Given his multiple abdominal surgeries, it would be ideal if we can get this obstruction to resolve without surgical intervention.  If we are unable to get this resolved with conservative management, NGT decompression, bowel rest, and SBO protocol, then he may require surgical intervention.  Suspect some of his WBC is secondary to dehydration based on his hgb, and low K.  Will monitor this closely.  He does not have an acute abdomen and is only focally tender in RLQ so there is currently no concern for acute surgical indications.  Suspect his lactic acid at 3.2 will fall as well after hydration.  We will continue to follow this patient closely.   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  (Hasn't taken pain medication in 6 months) CYP 450 inhibition (causing multiple allergies)  Henreitta Cea, [facility] 03/31/2021, 1:14 PM Please see Amion for pager number during day hours 7:00am-4:30pm or 7:00am -11:30am on weekends

## 2021-04-01 ENCOUNTER — Inpatient Hospital Stay (HOSPITAL_COMMUNITY): Payer: No Typology Code available for payment source

## 2021-04-01 DIAGNOSIS — K56609 Unspecified intestinal obstruction, unspecified as to partial versus complete obstruction: Secondary | ICD-10-CM

## 2021-04-01 LAB — PROTIME-INR
INR: 1.1 (ref 0.8–1.2)
Prothrombin Time: 14.5 seconds (ref 11.4–15.2)

## 2021-04-01 LAB — BASIC METABOLIC PANEL
Anion gap: 8 (ref 5–15)
BUN: 13 mg/dL (ref 6–20)
CO2: 28 mmol/L (ref 22–32)
Calcium: 8.5 mg/dL — ABNORMAL LOW (ref 8.9–10.3)
Chloride: 102 mmol/L (ref 98–111)
Creatinine, Ser: 0.96 mg/dL (ref 0.61–1.24)
GFR, Estimated: >60 mL/min (ref >60)
Glucose, Bld: 105 mg/dL — ABNORMAL HIGH (ref 70–99)
Potassium: 3.4 mmol/L — ABNORMAL LOW (ref 3.5–5.1)
Sodium: 138 mmol/L (ref 135–145)

## 2021-04-01 LAB — CBC
HCT: 37.7 % — ABNORMAL LOW (ref 39.0–52.0)
Hemoglobin: 12.9 g/dL — ABNORMAL LOW (ref 13.0–17.0)
MCH: 30.9 pg (ref 26.0–34.0)
MCHC: 34.2 g/dL (ref 30.0–36.0)
MCV: 90.4 fL (ref 80.0–100.0)
Platelets: 220 10*3/uL (ref 150–400)
RBC: 4.17 MIL/uL — ABNORMAL LOW (ref 4.22–5.81)
RDW: 12.2 % (ref 11.5–15.5)
WBC: 13 10*3/uL — ABNORMAL HIGH (ref 4.0–10.5)
nRBC: 0 % (ref 0.0–0.2)

## 2021-04-01 LAB — POTASSIUM: Potassium: 3.5 mmol/L (ref 3.5–5.1)

## 2021-04-01 LAB — HIV ANTIBODY (ROUTINE TESTING W REFLEX): HIV Screen 4th Generation wRfx: NONREACTIVE

## 2021-04-01 LAB — MAGNESIUM: Magnesium: 2 mg/dL (ref 1.7–2.4)

## 2021-04-01 MED ORDER — POTASSIUM CHLORIDE 10 MEQ/100ML IV SOLN: 10.0000 meq | INTRAVENOUS | Status: DC

## 2021-04-01 MED ORDER — POTASSIUM CHLORIDE 10 MEQ/100ML IV SOLN
10.0000 meq | INTRAVENOUS | Status: AC
  Administered 2021-04-01 (×3): 10 meq via INTRAVENOUS
  Filled 2021-04-01 (×3): qty 100

## 2021-04-01 MED ORDER — MORPHINE SULFATE (PF) 2 MG/ML IV SOLN
2.0000 mg | INTRAVENOUS | Status: DC | PRN
  Administered 2021-04-01 – 2021-04-02 (×5): 2 mg via INTRAVENOUS
  Filled 2021-04-01 (×5): qty 1

## 2021-04-01 MED ORDER — ENOXAPARIN SODIUM 80 MG/0.8ML IJ SOSY: 80.0000 mg | PREFILLED_SYRINGE | INTRAMUSCULAR | Status: DC

## 2021-04-01 MED ORDER — POTASSIUM CHLORIDE 10 MEQ/100ML IV SOLN
10.0000 meq | Freq: Once | INTRAVENOUS | Status: AC
  Administered 2021-04-01: 10 meq via INTRAVENOUS
  Filled 2021-04-01: qty 100

## 2021-04-01 MED ORDER — LACTATED RINGERS IV SOLN: INTRAVENOUS | Status: DC

## 2021-04-01 NOTE — Care Management (Signed)
°  Transition of Care [facility]) Screening Note   Patient Details  Name: [name] Date of Birth: [birth year]   Transition of Care [facility]) CM/SW Contact:    Carles Collet, RN Phone Number: 04/01/2021, 1:38 PM    Transition of Care Department [facility]) has reviewed patient and no TOC needs have been identified at this time. We will continue to monitor patient advancement through interdisciplinary progression rounds. If new patient transition needs arise, please place a TOC consult.

## 2021-04-01 NOTE — Progress Notes (Signed)
°   04/01/21 1458  Unmeasured Output  Stool Occurrence 1  Urine Characteristics  Hygiene Peri care  Stool Characteristics  Bowel Incontinence No  Stool Type Type 7 (Liquid consistency with no solid pieces)  Stool Descriptors Brown  Stool Amount Large  Stool Source Rectum

## 2021-04-01 NOTE — Progress Notes (Signed)
°  Date: 04/01/2021  Patient name: [name]  Medical record number: [id number]  Date of birth: [birth year]   I have seen and evaluated Lurlean Leyden and discussed their care with the Residency Team.  In brief: Patient is a 55 year old male with a past medical history of multiple GI surgeries and chronic back pain who presented to the ED with worsening abdominal pain x1 day.  Patient states that on the day prior to his admission he noted increased abdominal pressure and was able to have a partial bowel movement but felt like he could not fully evacuate his bowels.  He then gave himself a laxative and a water enema without any success and had persistent abdominal discomfort.  Patient states that he was tolerating oral intake initially but then was unable to eat lunch and had an episode of nausea and vomiting.  Given his persistent abdominal pain and inability to tolerate oral intake as well as increased abdominal distention he came to the ED for further evaluation.  No fevers or chills, no chest pain, no shortness of breath, no palpitations, no lightheadedness, no syncope, no focal weakness, no tingling or numbness, no diarrhea.  Today, patient states that his abdominal pain is much improved and he feels that his abdominal distention has decreased.  He is still unable to pass flatus.  PMHx, Fam Hx, and/or Soc Hx : As per resident admit note  Vitals:   04/01/21 0700 04/01/21 0801  BP:  127/64  Pulse: 73 79  Resp: 17 13  Temp:  99 F (37.2 C)  SpO2: 96% 95%   General: Awake, alert, oriented x3, NAD CVS: Regular in rhythm, heart sounds clear Lungs: CTA bilaterally Abdomen: Soft, mild tenderness to palpation right lower quadrant at prior incision site, mildly distended, diminished bowel sounds, NG tube in place Extremities: No edema noted, nontender to palpation Psych: Normal mood and affect HEENT: Normocephalic, atraumatic, NG tube in place Skin: Warm and dry  Assessment and Plan: I  have seen and evaluated the patient as outlined above. I agree with the formulated Assessment and Plan as detailed in the residents' note, with the following changes:   1.  Small bowel obstruction: -Patient presented to ED with abdominal pain, distention as well as decreased oral intake and an episode of nausea and vomiting and was found to have recurrent small bowel obstruction with transition point just proximal to his prior enterocolic anastomosis on imaging. -Maintain n.p.o. for now -NG tube in place with bilious output -Surgery follow-up and recommendations appreciated.  They will administer Gastrografin this morning with 8-hour follow-up film -Patient lactic acidosis has now resolved and his white count has improved -We will continue with IV hydration for now -Continue with pain control with morphine -No need for antibiotics at this time -We will continue to supplement potassium to maintain greater than 4 and keep magnesium greater than 2 -No further work-up at this time  Aldine Contes, MD 12/17/202211:07 AM

## 2021-04-01 NOTE — Progress Notes (Signed)
Subjective/Chief Complaint: Patient reports no flatus, but feels less distended with no nausea and decreased RLQ abdominal pain SBO protocol does not appear to have been followed as ordered. Lactate normal WBc improved after rehydration   Objective: Vital signs in last 24 hours: Temp:  [97.8 F (36.6 C)-99.3 F (37.4 C)] 99 F (37.2 C) (12/17 0801) Pulse Rate:  [57-91] 79 (12/17 0801) Resp:  [13-22] 13 (12/17 0801) BP: (103-157)/(61-94) 127/64 (12/17 0801) SpO2:  [92 %-100 %] 95 % (12/17 0801) Weight:  [77.7 kg-169.1 kg] 169.1 kg (12/16 2200) Last BM Date: 03/29/21  Intake/Output from previous day: 12/16 0701 - 12/17 0700 In: 0  Out: 400 [Emesis/NG output:400] Intake/Output this shift: No intake/output data recorded.  General appearance: alert, cooperative, and no distress GI: non-distended; quiet; mild RLQ tenderness; multiple healed incisions NG tube with bilious output  Lab Results:  Recent Labs    03/31/21 0926 04/01/21 0118  WBC 21.8* 13.0*  HGB 13.8 12.9*  HCT 42.0 37.7*  PLT 277 220   BMET Recent Labs    03/31/21 0926 04/01/21 0118  NA 137 138  K 3.2* 3.4*  CL 97* 102  CO2 28 28  GLUCOSE 156* 105*  BUN 11 13  CREATININE 1.07 0.96  CALCIUM 10.1 8.5*   PT/INR Recent Labs    04/01/21 0118  LABPROT 14.5  INR 1.1   ABG No results for input(s): PHART, HCO3 in the last 72 hours.  Invalid input(s): PCO2, PO2  Studies/Results: CT Abdomen Pelvis W Contrast  Result Date: 03/31/2021 CLINICAL DATA:  Severe abdominal pain. History of bowel obstruction and numerous abdominal surgeries. EXAM: CT ABDOMEN AND PELVIS WITH CONTRAST TECHNIQUE: Multidetector CT imaging of the abdomen and pelvis was performed using the standard protocol following bolus administration of intravenous contrast. CONTRAST:  140mL OMNIPAQUE IOHEXOL 300 MG/ML  SOLN COMPARISON:  MRI abdomen dated October 26, 2016. CT abdomen and pelvis dated June 05, 2016. FINDINGS: Lower chest:  No acute abnormality. Unchanged scarring at the left lung base. Hepatobiliary: Multiple hepatic hemangiomas are unchanged. No new focal liver abnormality. The gallbladder is unremarkable. No biliary dilatation. Pancreas: Unremarkable. No pancreatic ductal dilatation or surrounding inflammatory changes. Spleen: Normal in size without focal abnormality. Adrenals/Urinary Tract: The adrenal glands and left kidney are unremarkable. New small nonobstructive calculi in the lower pole the right kidney. No hydronephrosis. The bladder is unremarkable. Stomach/Bowel: Prior right hemicolectomy with interval ileostomy takedown. Right upper quadrant anastomosis. Multiple dilated loops of mid and distal small bowel with transition point in the right upper quadrant where there is mildly thickened ileum just proximal to the coloenteric anastomosis (series 3, image 41; series 6, image 78). No pneumatosis. The colon is largely decompressed. The stomach is within normal limits. Vascular/Lymphatic: Aortic atherosclerosis. No enlarged abdominal or pelvic lymph nodes. Reproductive: Prostate is unremarkable. Other: Small amount of interloop ascites associated with the dilated small bowel. No fluid collection or pneumoperitoneum. Musculoskeletal: No acute or significant osseous findings. IMPRESSION: 1. Prior right hemicolectomy with interval ileostomy takedown. Small-bowel obstruction with transition point just proximal to the coloenteric anastomosis. No perforation or abscess. 2. New nonobstructive right nephrolithiasis. 3. Aortic Atherosclerosis (ICD10-I70.0). Electronically Signed   By: Titus Dubin M.D.   On: 03/31/2021 11:59   DG Chest Portable 1 View  Result Date: 03/31/2021 CLINICAL DATA:  Rule out free air EXAM: PORTABLE CHEST 1 VIEW COMPARISON:  06/04/2016 FINDINGS: The heart size and mediastinal contours are within normal limits. Both lungs are clear. Bridging fusion of the  lower thoracic spine. No free air in the included  upper abdomen. IMPRESSION: 1.  No acute abnormality of the lungs in AP portable projection. 2.  No free air in the included upper abdomen. Electronically Signed   By: Delanna Ahmadi M.D.   On: 03/31/2021 11:21   DG Abd Portable 1V-Small Bowel Protocol-Position Verification  Result Date: 03/31/2021 CLINICAL DATA:  Nasogastric tube placement EXAM: PORTABLE ABDOMEN - 1 VIEW COMPARISON:  CT of earlier today and plain film of 06/07/2016 FINDINGS: Nasogastric tube terminates at the body of the stomach with the side port in the proximal stomach. Thoracic and lumbar spine fixation. IMPRESSION: Nasogastric tube terminating at the body of the stomach. Electronically Signed   By: Abigail Miyamoto M.D.   On: 03/31/2021 14:29    Anti-infectives: Anti-infectives (From admission, onward)    Start     Dose/Rate Route Frequency Ordered Stop   03/31/21 1030  piperacillin-tazobactam (ZOSYN) IVPB 3.375 g        3.375 g 100 mL/hr over 30 Minutes Intravenous  Once 03/31/21 1027 03/31/21 1303       Assessment/Plan: SBO - almost certainly due to adhesions from his previous surgeries  Lactate/ WBC improved after hydration I spoke with his nurse about following the SBO protocol - administer gastrografin this morning with 8 hour follow-up film.  Hopefully, this SBO will resolve with conservative measures, but he might require surgery if no improvement.     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

## 2021-04-01 NOTE — Progress Notes (Addendum)
° °  Subjective:   No acute overnight events.  Reports feeling fine this morning. Abdominal pain still there. Frustrated that he cannot just get up to go to the restroom due to NGT. Asking for something to calm down the nerves, but states that it is fine if nothing is given. States that surgery came by earlier and are going to take some images to see if obstruction is improving.  Objective:  Vital signs in last 24 hours: Vitals:   04/01/21 0400 04/01/21 0500 04/01/21 0700 04/01/21 0801  BP: 112/61   127/64  Pulse: 71 70 73 79  Resp: 14 16 17 13   Temp:    99 F (37.2 C)  TempSrc:    Oral  SpO2: 95% 94% 96% 95%  Weight:      Height:       Physical Exam: General: middle-aged male, sitting up in bed, NAD. Nose: NG tube in place, draining greenish material. CV: normal rate and regular rhythm, no m/r/g. Pulm: CTABL, no adventitious sounds noted. Abdomen: soft, nondistended, TTP in RLQ, hypoactive bowel sounds, healed midline incision. Skin: warm and dry  Assessment/Plan:  Principal Problem:   Small bowel obstruction (HCC)  Small Bowel Obstruction Seen on CT A/P. History of multiple prior abdominal surgeries. Likely adhesive obstruction versus functional narrowing of anastomosis. Surgery following, planning to conservatively manage and avoid another surgery, although surgery may be required if no improvement. Remains afebrile overnight, leukocytosis improving, lactate trended down.  -NG tube in place with bilious output -NPO for bowel rest -gastrografin study followed by repeat abdominal films -morphine 2mg  q3h prn -no need for antibiotics at this time, f/u blood cx -continue maintenance with LR @100cc /hr  Hypokalemia Potassium 3.4 this AM, will replete to keep K >4.0 and Mg >2.0. -repleting K via IV as patient is NPO -f/u K and Mg levels @1700 , replete as noted above.  Prior to Admission Living Arrangement: home Anticipated Discharge Location: home Barriers to Discharge:  continued medical management Dispo: Anticipated discharge in approximately 2-3 day(s).   Virl Axe, MD 04/01/2021, 10:31 AM Pager: [phone] After 5pm on weekdays and 1pm on weekends: On Call pager [phone]

## 2021-04-02 LAB — BASIC METABOLIC PANEL
Anion gap: 9 (ref 5–15)
BUN: 16 mg/dL (ref 6–20)
CO2: 28 mmol/L (ref 22–32)
Calcium: 8.6 mg/dL — ABNORMAL LOW (ref 8.9–10.3)
Chloride: 102 mmol/L (ref 98–111)
Creatinine, Ser: 0.87 mg/dL (ref 0.61–1.24)
GFR, Estimated: >60 mL/min (ref >60)
Glucose, Bld: 91 mg/dL (ref 70–99)
Potassium: 3.2 mmol/L — ABNORMAL LOW (ref 3.5–5.1)
Sodium: 139 mmol/L (ref 135–145)

## 2021-04-02 LAB — CBC
HCT: 35.9 % — ABNORMAL LOW (ref 39.0–52.0)
Hemoglobin: 11.9 g/dL — ABNORMAL LOW (ref 13.0–17.0)
MCH: 30.5 pg (ref 26.0–34.0)
MCHC: 33.1 g/dL (ref 30.0–36.0)
MCV: 92.1 fL (ref 80.0–100.0)
Platelets: 211 10*3/uL (ref 150–400)
RBC: 3.9 MIL/uL — ABNORMAL LOW (ref 4.22–5.81)
RDW: 12.3 % (ref 11.5–15.5)
WBC: 9.3 10*3/uL (ref 4.0–10.5)
nRBC: 0 % (ref 0.0–0.2)

## 2021-04-02 MED ORDER — POTASSIUM CHLORIDE 20 MEQ PO PACK
40.0000 meq | PACK | Freq: Once | ORAL | Status: AC
  Administered 2021-04-02: 11:00:00 40 meq via ORAL
  Filled 2021-04-02: qty 2

## 2021-04-02 MED ORDER — POTASSIUM CHLORIDE CRYS ER 20 MEQ PO TBCR
40.0000 meq | EXTENDED_RELEASE_TABLET | Freq: Once | ORAL | Status: AC
  Administered 2021-04-02: 17:00:00 40 meq via ORAL
  Filled 2021-04-02: qty 2

## 2021-04-02 MED ORDER — POTASSIUM CHLORIDE 10 MEQ/100ML IV SOLN: 10.0000 meq | INTRAVENOUS | Status: DC

## 2021-04-02 MED ORDER — POTASSIUM CHLORIDE 10 MEQ/100ML IV SOLN
10.0000 meq | INTRAVENOUS | Status: DC
  Administered 2021-04-02 (×3): 10 meq via INTRAVENOUS
  Filled 2021-04-02 (×3): qty 100

## 2021-04-02 MED ORDER — ENOXAPARIN SODIUM 40 MG/0.4ML IJ SOSY
40.0000 mg | PREFILLED_SYRINGE | INTRAMUSCULAR | Status: DC
  Administered 2021-04-02 – 2021-04-03 (×2): 40 mg via SUBCUTANEOUS
  Filled 2021-04-02 (×2): qty 0.4

## 2021-04-02 MED ORDER — LACTATED RINGERS IV SOLN
INTRAVENOUS | Status: AC
Start: 2021-04-02 — End: 2021-04-03

## 2021-04-02 MED ORDER — MORPHINE SULFATE (PF) 2 MG/ML IV SOLN
2.0000 mg | INTRAVENOUS | Status: DC | PRN
  Administered 2021-04-02 – 2021-04-03 (×6): 2 mg via INTRAVENOUS
  Filled 2021-04-02 (×6): qty 1

## 2021-04-02 MED ORDER — POTASSIUM CHLORIDE 20 MEQ PO PACK: 40.0000 meq | PACK | Freq: Once | ORAL | Status: DC

## 2021-04-02 NOTE — Progress Notes (Addendum)
° °  Subjective:   No acute overnight events.  Per chart review patient had a loose BM yesterday. He reports 3/10 ABD pain. He is seen while his NG tube is removed. Pt reports relief once this is done.   Objective:  Vital signs in last 24 hours: Vitals:   04/02/21 0600 04/02/21 0700 04/02/21 0800 04/02/21 0841  BP:    131/76  Pulse: 63 68  72  Resp: 17 (!) 21  19  Temp:    98.2 F (36.8 C)  TempSrc:    Oral  SpO2: 95% 94% 100% 95%  Weight:      Height:       Physical Exam: General: middle-aged male, sitting up in bed, NAD. Nose: NG tube in place, draining formed feculent material CV: normal rate and regular rhythm, no m/r/g. Pulm: CTABL, no adventitious sounds noted. Abdomen: soft, nondistended, only mildly TTP, hyperactive bowel sounds, healed midline incision. 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   Small Bowel Obstruction Gastrografin study showing contrast in the rectum and improved small bowel dilatation. No leukocytosis or fever. NG out. Advancing diet today. -CLD, advance as tolerated -morphine 2mg  q3h prn -LR at 75 mL/hr  Hypokalemia Potassium 3.2 this AM, will replete to keep K >4.0 and Mg >2.0. -IV Kcl 10 meq x5 -Daily BMP  VTE: Lovenox 40 mg (patient does not weigh 169 kg and therefore does not need Lovenox 80 mg) Code status: FULL  Prior to Admission Living Arrangement: home Anticipated Discharge Location: home Barriers to Discharge: continued medical management Dispo: Anticipated discharge in approximately 2-3 day(s).   Corky Sox, MD PGY-1 Pager: [phone] After 5pm on weekdays and 1pm on weekends: On Call pager: [phone]

## 2021-04-02 NOTE — Progress Notes (Signed)
Pts NG tube removed per MD orders, pt tolerated well.Pt given water and sprite with no signs of nausea or vomiting will continue to monitor

## 2021-04-02 NOTE — Progress Notes (Signed)
Subjective/Chief Complaint: Large loose BM last night Less distended, but still "sore" in RLQ SBO protocol - 8 hr film shows contrast in rectum WBC normal Receiving potassium IV - complaining of burning in his arm   Objective: Vital signs in last 24 hours: Temp:  [98.8 F (37.1 C)-99.7 F (37.6 C)] 98.9 F (37.2 C) (12/18 0400) Pulse Rate:  [63-83] 68 (12/18 0700) Resp:  [10-23] 21 (12/18 0700) BP: (110-132)/(68-81) 110/68 (12/18 0400) SpO2:  [85 %-99 %] 94 % (12/18 0700) Last BM Date: 04/01/21  Intake/Output from previous day: 12/17 0701 - 12/18 0700 In: 56.7 [I.V.:56.7] Out: 1100 [Urine:800; Emesis/NG output:300] Intake/Output this shift: No intake/output data recorded.  WDWN in NAD NG output -  bilious L arm - no erythema around IV site Abd - soft, minimal RLQ tenderness   Lab Results:  Recent Labs    04/01/21 0118 04/02/21 0050  WBC 13.0* 9.3  HGB 12.9* 11.9*  HCT 37.7* 35.9*  PLT 220 211   BMET Recent Labs    04/01/21 0118 04/01/21 1643 04/02/21 0050  NA 138  --  139  K 3.4* 3.5 3.2*  CL 102  --  102  CO2 28  --  28  GLUCOSE 105*  --  91  BUN 13  --  16  CREATININE 0.96  --  0.87  CALCIUM 8.5*  --  8.6*   PT/INR Recent Labs    04/01/21 0118  LABPROT 14.5  INR 1.1   ABG No results for input(s): PHART, HCO3 in the last 72 hours.  Invalid input(s): PCO2, PO2  Studies/Results: CT Abdomen Pelvis W Contrast  Result Date: 03/31/2021 CLINICAL DATA:  Severe abdominal pain. History of bowel obstruction and numerous abdominal surgeries. EXAM: CT ABDOMEN AND PELVIS WITH CONTRAST TECHNIQUE: Multidetector CT imaging of the abdomen and pelvis was performed using the standard protocol following bolus administration of intravenous contrast. CONTRAST:  149mL OMNIPAQUE IOHEXOL 300 MG/ML  SOLN COMPARISON:  MRI abdomen dated October 26, 2016. CT abdomen and pelvis dated June 05, 2016. FINDINGS: Lower chest: No acute abnormality. Unchanged scarring at  the left lung base. Hepatobiliary: Multiple hepatic hemangiomas are unchanged. No new focal liver abnormality. The gallbladder is unremarkable. No biliary dilatation. Pancreas: Unremarkable. No pancreatic ductal dilatation or surrounding inflammatory changes. Spleen: Normal in size without focal abnormality. Adrenals/Urinary Tract: The adrenal glands and left kidney are unremarkable. New small nonobstructive calculi in the lower pole the right kidney. No hydronephrosis. The bladder is unremarkable. Stomach/Bowel: Prior right hemicolectomy with interval ileostomy takedown. Right upper quadrant anastomosis. Multiple dilated loops of mid and distal small bowel with transition point in the right upper quadrant where there is mildly thickened ileum just proximal to the coloenteric anastomosis (series 3, image 41; series 6, image 78). No pneumatosis. The colon is largely decompressed. The stomach is within normal limits. Vascular/Lymphatic: Aortic atherosclerosis. No enlarged abdominal or pelvic lymph nodes. Reproductive: Prostate is unremarkable. Other: Small amount of interloop ascites associated with the dilated small bowel. No fluid collection or pneumoperitoneum. Musculoskeletal: No acute or significant osseous findings. IMPRESSION: 1. Prior right hemicolectomy with interval ileostomy takedown. Small-bowel obstruction with transition point just proximal to the coloenteric anastomosis. No perforation or abscess. 2. New nonobstructive right nephrolithiasis. 3. Aortic Atherosclerosis (ICD10-I70.0). Electronically Signed   By: Titus Dubin M.D.   On: 03/31/2021 11:59   DG Chest Portable 1 View  Result Date: 03/31/2021 CLINICAL DATA:  Rule out free air EXAM: PORTABLE CHEST 1 VIEW COMPARISON:  06/04/2016  FINDINGS: The heart size and mediastinal contours are within normal limits. Both lungs are clear. Bridging fusion of the lower thoracic spine. No free air in the included upper abdomen. IMPRESSION: 1.  No acute  abnormality of the lungs in AP portable projection. 2.  No free air in the included upper abdomen. Electronically Signed   By: Delanna Ahmadi M.D.   On: 03/31/2021 11:21   DG Abd Portable 1V-Small Bowel Obstruction Protocol-initial, 8 hr delay  Result Date: 04/01/2021 CLINICAL DATA:  Small bowel obstruction. EXAM: PORTABLE ABDOMEN - 1 VIEW COMPARISON:  March 31, 2021 FINDINGS: Contrast is now seen in the colon to the level the rectum. No dilated small bowel loops are noted. The NG tube is in good position. IMPRESSION: Contrast now extends to the level the rectum. No small bowel dilatation on this study. The NG tube is in good position. Electronically Signed   By: Dorise Bullion III M.D.   On: 04/01/2021 20:19   DG Abd Portable 1V-Small Bowel Protocol-Position Verification  Result Date: 03/31/2021 CLINICAL DATA:  Nasogastric tube placement EXAM: PORTABLE ABDOMEN - 1 VIEW COMPARISON:  CT of earlier today and plain film of 06/07/2016 FINDINGS: Nasogastric tube terminates at the body of the stomach with the side port in the proximal stomach. Thoracic and lumbar spine fixation. IMPRESSION: Nasogastric tube terminating at the body of the stomach. Electronically Signed   By: Abigail Miyamoto M.D.   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

## 2021-04-03 LAB — CBC
HCT: 36.4 % — ABNORMAL LOW (ref 39.0–52.0)
Hemoglobin: 12.2 g/dL — ABNORMAL LOW (ref 13.0–17.0)
MCH: 30.9 pg (ref 26.0–34.0)
MCHC: 33.5 g/dL (ref 30.0–36.0)
MCV: 92.2 fL (ref 80.0–100.0)
Platelets: 209 10*3/uL (ref 150–400)
RBC: 3.95 MIL/uL — ABNORMAL LOW (ref 4.22–5.81)
RDW: 11.9 % (ref 11.5–15.5)
WBC: 9 10*3/uL (ref 4.0–10.5)
nRBC: 0 % (ref 0.0–0.2)

## 2021-04-03 LAB — BASIC METABOLIC PANEL
Anion gap: 8 (ref 5–15)
BUN: 6 mg/dL (ref 6–20)
CO2: 26 mmol/L (ref 22–32)
Calcium: 9.2 mg/dL (ref 8.9–10.3)
Chloride: 101 mmol/L (ref 98–111)
Creatinine, Ser: 0.79 mg/dL (ref 0.61–1.24)
GFR, Estimated: >60 mL/min (ref >60)
Glucose, Bld: 109 mg/dL — ABNORMAL HIGH (ref 70–99)
Potassium: 3.7 mmol/L (ref 3.5–5.1)
Sodium: 135 mmol/L (ref 135–145)

## 2021-04-03 MED ORDER — MELATONIN 5 MG PO TABS: 5.0000 mg | ORAL_TABLET | Freq: Every evening | ORAL | 0 refills | Status: AC | PRN

## 2021-04-03 MED ORDER — POTASSIUM CHLORIDE 20 MEQ PO PACK
40.0000 meq | PACK | Freq: Two times a day (BID) | ORAL | Status: AC
  Administered 2021-04-03: 09:00:00 40 meq via ORAL
  Filled 2021-04-03: qty 2

## 2021-04-03 NOTE — Discharge Instructions (Signed)
You were admitted for an obstruction of your intestines. Luckily this resolved without surgery. You may resume a normal diet as tolerated. If you experience recurrence of your symptoms, such as nausea/vomiting, abdominal pain, or an inability to take in a good amount of food or water, please come back to the emergency department. Please follow up with your PCP within 7 days.   Corky Sox, MD

## 2021-04-03 NOTE — Discharge Summary (Addendum)
Name: [name] MRN: [id number] DOB: 07-18-1965 55 y.o. PCP: Timoteo Gaul, FNP  Date of Admission: 03/31/2021  9:11 AM Date of Discharge: 04/03/2021 Attending Physician: Aldine Contes, MD  Discharge Diagnosis: 1. Small bowel obstruction (resolved) 2. Hx multiple previous abdominal surgeries 3. Chronic back pain (no on opioids)  Discharge Medications: Allergies as of 04/03/2021       Reactions   Celecoxib Other (See Comments)   IRRITABILITY   Cyclobenzaprine Other (See Comments)   IRRITABILITY   Dexamethasone Other (See Comments)   IRRITABILITY   Diazepam Other (See Comments)   IRRITABILITY   Etodolac Other (See Comments)   IRRITABILITY   Gabapentin Other (See Comments)   IRRITABILITY   Metaxalone Other (See Comments)   IRRITABILITY (Skelaxin)   Methocarbamol Other (See Comments)   IRRITABILITY   Pregabalin Other (See Comments)   Aggression and irritability   Propoxyphene Other (See Comments)   IRRITABILITY   Tape Rash, Other (See Comments)   PLASTIC TAPE TAKES OFF 1-2 LAYERS OF [name] SKIN   Tramadol Other (See Comments)   IRRITABILITY   Wound Dressing Adhesive Rash, Other (See Comments)   PLASTIC TAPE TAKES OFF 1-2 LAYERS OF [name] SKIN, also   Clonazepam Itching   Aspirin Other (See Comments)   Cannot take this again- affects bowels- Tylenol ONLY   Motrin [ibuprofen] Other (See Comments)   Cannot take this again- affects bowels- Tylenol ONLY   Oxycodone Other (See Comments)   Made the patient feel "uncomfortable"   Latex Itching, Rash        Medication List     STOP taking these medications    albuterol 108 (90 Base) MCG/ACT inhaler Commonly known as: VENTOLIN HFA   Benadryl Allergy 25 mg capsule Generic drug: diphenhydrAMINE   loperamide 2 MG capsule Commonly known as: IMODIUM   psyllium 95 % Pack Commonly known as: HYDROCIL/METAMUCIL   ZzzQuil 50 MG/30ML Liqd Generic drug: diphenhydrAMINE HCl       TAKE these  medications    acetaminophen 500 MG tablet Commonly known as: TYLENOL Take 500-1,000 mg by mouth every 6 (six) hours as needed for mild pain.   melatonin 5 MG Tabs Take 1 tablet (5 mg total) by mouth at bedtime as needed (for sleep). What changed: how much to take        Disposition and follow-up:   [name] was discharged from [facility] in Stable condition.  At the hospital follow up visit please address:  1.  General health maintenance  2.  Labs / imaging needed at time of follow-up: NA  3.  Pending labs/ test needing follow-up: NA  Hospital Course by problem list: 1. Small bowel obstruction (resolved), Hx multiple previous abdominal surgeries Patient with Hx of perforation of small bowel in 2017, for which the patient had a small bowel resection, ileocecectomy, and ileostomy with subsequent ileostomy takedown in 2018. He presented with abdominal pain and inability to take in food/drink. CT A/P notable for an SBO with a transition point just proximal to the coloenteric anastomosis. Surgery desired to manage his SBO conservatively given his previous surgeries. It was felt the SBO was due to adhesions from his previous surgeries. An NG tube was placed and he was kept NPO. Gastrografin study was completed showing contract in the rectum. His NG tube was removed and he was put onto a clear liquid diet. He tolerated this and was able to be advanced to a soft diet on the  morning of discharge. He tolerated his meals without any nausea or vomiting. He had multiple bowel movements before D/C and had minimal abdominal pain. He was discharged with return precautions.    Discharge Exam:   BP 118/90 (BP Location: Left Arm)    Pulse 88    Temp 99.5 F (37.5 C) (Oral)    Resp 18    Ht 5\' 10"  (1.778 m)    Wt 80.5 kg    SpO2 98%    BMI 25.47 kg/m  Discharge exam:  Physical Exam: General: middle-aged male, sitting up in bed, NAD. CV: normal rate and regular rhythm, no  m/r/g. Pulm: CTABL, no adventitious sounds noted. Abdomen: soft, nondistended, only mildly TTP, normoactive bowel sounds, healed midline incision. Skin: warm and dry  Pertinent Labs, Studies, and Procedures:  CT A/P and gastrografin study as above  Discharge Instructions: You were admitted for an obstruction of your intestines. Luckily this resolved without surgery. You may resume a normal diet as tolerated. If you experience recurrence of your symptoms, such as nausea/vomiting, abdominal pain, or an inability to take in a good amount of food or water, please come back to the emergency department. Please follow up with your PCP within 7 days.    Signed: Corky Sox, MD PGY-1

## 2021-04-03 NOTE — Progress Notes (Signed)
° °  Subjective/Chief Complaint: Reports multiple BMs last night and is having flatus. Ongoing lower abdominal "soreness" that is improved compared to on admission. Tolerating liquids and would like to eat something solid. Also c/o baseline loose and explosive stools that improve with opium. Also c/o chronic back pain, bone pain, and headaches.   Afebrile, WBC WNL, potassium 3.7 from 3.2  Objective: Vital signs in last 24 hours: Temp:  [98.2 F (36.8 C)-99.2 F (37.3 C)] 99.2 F (37.3 C) (12/19 0758) Pulse Rate:  [62-72] 66 (12/19 0758) Resp:  [16-20] 20 (12/19 0758) BP: (111-131)/(68-80) 112/68 (12/19 0758) SpO2:  [95 %-100 %] 98 % (12/19 0758) Weight:  [80.5 kg] 80.5 kg (12/19 0426) Last BM Date: 04/02/21  Intake/Output from previous day: 12/18 0701 - 12/19 0700 In: 837.5 [P.O.:360; I.V.:477.5] Out: 600 [Urine:500; Emesis/NG output:100] Intake/Output this shift: No intake/output data recorded.  WDWN in NAD Abd - soft, minimal RLQ tenderness - mostly subjective, no guarding, previous laparotomy scar appears well-healing.   Lab Results:  Recent Labs    04/02/21 0050 04/03/21 0111  WBC 9.3 9.0  HGB 11.9* 12.2*  HCT 35.9* 36.4*  PLT 211 209   BMET Recent Labs    04/02/21 0050 04/03/21 0111  NA 139 135  K 3.2* 3.7  CL 102 101  CO2 28 26  GLUCOSE 91 109*  BUN 16 6  CREATININE 0.87 0.79  CALCIUM 8.6* 9.2   PT/INR Recent Labs    04/01/21 0118  LABPROT 14.5  INR 1.1   ABG No results for input(s): PHART, HCO3 in the last 72 hours.  Invalid input(s): PCO2, PO2  Studies/Results: DG Abd Portable 1V-Small Bowel Obstruction Protocol-initial, 8 hr delay  Result Date: 04/01/2021 CLINICAL DATA:  Small bowel obstruction. EXAM: PORTABLE ABDOMEN - 1 VIEW COMPARISON:  March 31, 2021 FINDINGS: Contrast is now seen in the colon to the level the rectum. No dilated small bowel loops are noted. The NG tube is in good position. IMPRESSION: Contrast now extends to the  level the rectum. No small bowel dilatation on this study. The NG tube is in good position. Electronically Signed   By: Dorise Bullion III M.D.   On: 04/01/2021 20:19    Anti-infectives: Anti-infectives (From admission, onward)    Start     Dose/Rate Route Frequency Ordered Stop   03/31/21 1030  piperacillin-tazobactam (ZOSYN) IVPB 3.375 g        3.375 g 100 mL/hr over 30 Minutes Intravenous  Once 03/31/21 1027 03/31/21 1303       Assessment/Plan: SBO - almost certainly due to adhesions from his previous surgeries - resolving  - tolerating FLD, Advance to soft. Stable for discharge from a [facility] perspective. Patient asked me for pain medication - I did not prescribe this. He should not need narcotics for his SBO that resolved w/ non-op mgmt. He plans to follow up outpatient with Dr. Ellene Route and a PCP vs pain specialist to discuss his chronic pain.      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

## 2021-04-05 LAB — CULTURE, BLOOD (ROUTINE X 2)
Culture: NO GROWTH
Culture: NO GROWTH
Special Requests: ADEQUATE

## 2022-12-30 ENCOUNTER — Inpatient Hospital Stay (HOSPITAL_COMMUNITY)
Admission: EM | Admit: 2022-12-30 | Discharge: 2023-01-02 | DRG: 390 | Disposition: A | Discharge status: Home / Self Care | Payer: Medicare Other | Attending: Internal Medicine | Admitting: Internal Medicine

## 2022-12-30 ENCOUNTER — Observation Stay (HOSPITAL_COMMUNITY): Payer: Medicare Other

## 2022-12-30 ENCOUNTER — Other Ambulatory Visit: Payer: Self-pay

## 2022-12-30 ENCOUNTER — Emergency Department (HOSPITAL_COMMUNITY): Payer: Medicare Other

## 2022-12-30 DIAGNOSIS — G4700 Insomnia, unspecified: Secondary | ICD-10-CM | POA: Diagnosis present

## 2022-12-30 DIAGNOSIS — N200 Calculus of kidney: Secondary | ICD-10-CM | POA: Diagnosis present

## 2022-12-30 DIAGNOSIS — G8929 Other chronic pain: Secondary | ICD-10-CM | POA: Diagnosis present

## 2022-12-30 DIAGNOSIS — R160 Hepatomegaly, not elsewhere classified: Secondary | ICD-10-CM | POA: Diagnosis present

## 2022-12-30 DIAGNOSIS — Z91040 Latex allergy status: Secondary | ICD-10-CM

## 2022-12-30 DIAGNOSIS — Z560 Unemployment, unspecified: Secondary | ICD-10-CM

## 2022-12-30 DIAGNOSIS — M21372 Foot drop, left foot: Secondary | ICD-10-CM | POA: Diagnosis present

## 2022-12-30 DIAGNOSIS — Z881 Allergy status to other antibiotic agents status: Secondary | ICD-10-CM

## 2022-12-30 DIAGNOSIS — Z885 Allergy status to narcotic agent status: Secondary | ICD-10-CM

## 2022-12-30 DIAGNOSIS — Z9049 Acquired absence of other specified parts of digestive tract: Secondary | ICD-10-CM

## 2022-12-30 DIAGNOSIS — F410 Panic disorder [episodic paroxysmal anxiety] without agoraphobia: Secondary | ICD-10-CM | POA: Diagnosis present

## 2022-12-30 DIAGNOSIS — K56609 Unspecified intestinal obstruction, unspecified as to partial versus complete obstruction: Secondary | ICD-10-CM | POA: Diagnosis not present

## 2022-12-30 DIAGNOSIS — Z886 Allergy status to analgesic agent status: Secondary | ICD-10-CM

## 2022-12-30 DIAGNOSIS — E876 Hypokalemia: Secondary | ICD-10-CM | POA: Diagnosis present

## 2022-12-30 DIAGNOSIS — Z888 Allergy status to other drugs, medicaments and biological substances status: Secondary | ICD-10-CM

## 2022-12-30 DIAGNOSIS — N319 Neuromuscular dysfunction of bladder, unspecified: Secondary | ICD-10-CM | POA: Diagnosis present

## 2022-12-30 DIAGNOSIS — Z91048 Other nonmedicinal substance allergy status: Secondary | ICD-10-CM

## 2022-12-30 DIAGNOSIS — F17210 Nicotine dependence, cigarettes, uncomplicated: Secondary | ICD-10-CM | POA: Diagnosis present

## 2022-12-30 DIAGNOSIS — E162 Hypoglycemia, unspecified: Secondary | ICD-10-CM | POA: Diagnosis not present

## 2022-12-30 LAB — COMPREHENSIVE METABOLIC PANEL
ALT: 17 U/L (ref 0–44)
AST: 22 U/L (ref 15–41)
Albumin: 3.9 g/dL (ref 3.5–5.0)
Alkaline Phosphatase: 86 U/L (ref 38–126)
Anion gap: 11 (ref 5–15)
BUN: 11 mg/dL (ref 6–20)
CO2: 25 mmol/L (ref 22–32)
Calcium: 9 mg/dL (ref 8.9–10.3)
Chloride: 99 mmol/L (ref 98–111)
Creatinine, Ser: 0.81 mg/dL (ref 0.61–1.24)
GFR, Estimated: >60 mL/min (ref >60)
Glucose, Bld: 107 mg/dL — ABNORMAL HIGH (ref 70–99)
Potassium: 3.4 mmol/L — ABNORMAL LOW (ref 3.5–5.1)
Sodium: 135 mmol/L (ref 135–145)
Total Bilirubin: 0.4 mg/dL (ref 0.3–1.2)
Total Protein: 7.5 g/dL (ref 6.5–8.1)

## 2022-12-30 LAB — CBC
HCT: 38.3 % — ABNORMAL LOW (ref 39.0–52.0)
Hemoglobin: 12.5 g/dL — ABNORMAL LOW (ref 13.0–17.0)
MCH: 29.1 pg (ref 26.0–34.0)
MCHC: 32.6 g/dL (ref 30.0–36.0)
MCV: 89.3 fL (ref 80.0–100.0)
Platelets: 272 10*3/uL (ref 150–400)
RBC: 4.29 MIL/uL (ref 4.22–5.81)
RDW: 13.6 % (ref 11.5–15.5)
WBC: 16.1 10*3/uL — ABNORMAL HIGH (ref 4.0–10.5)
nRBC: 0 % (ref 0.0–0.2)

## 2022-12-30 LAB — HIV ANTIBODY (ROUTINE TESTING W REFLEX): HIV Screen 4th Generation wRfx: NONREACTIVE

## 2022-12-30 LAB — LIPASE, BLOOD: Lipase: 35 U/L (ref 11–51)

## 2022-12-30 MED ORDER — POTASSIUM CHLORIDE 10 MEQ/100ML IV SOLN
10.0000 meq | INTRAVENOUS | Status: AC
  Administered 2022-12-30 (×3): 10 meq via INTRAVENOUS
  Filled 2022-12-30 (×3): qty 100

## 2022-12-30 MED ORDER — ONDANSETRON HCL 4 MG/2ML IJ SOLN
4.0000 mg | Freq: Once | INTRAMUSCULAR | Status: AC
  Administered 2022-12-30: 4 mg via INTRAVENOUS
  Filled 2022-12-30: qty 2

## 2022-12-30 MED ORDER — MORPHINE SULFATE (PF) 4 MG/ML IV SOLN
4.0000 mg | Freq: Once | INTRAVENOUS | Status: AC
  Administered 2022-12-30: 4 mg via INTRAVENOUS
  Filled 2022-12-30: qty 1

## 2022-12-30 MED ORDER — SODIUM CHLORIDE 0.9 % IV BOLUS
1000.0000 mL | Freq: Once | INTRAVENOUS | Status: AC
  Administered 2022-12-30: 1000 mL via INTRAVENOUS

## 2022-12-30 MED ORDER — HEPARIN SODIUM (PORCINE) 5000 UNIT/ML IJ SOLN
5000.0000 [IU] | Freq: Three times a day (TID) | INTRAMUSCULAR | Status: DC
  Administered 2022-12-30 – 2023-01-02 (×10): 5000 [IU] via SUBCUTANEOUS
  Filled 2022-12-30 (×10): qty 1

## 2022-12-30 MED ORDER — LACTATED RINGERS IV SOLN: INTRAVENOUS | Status: AC

## 2022-12-30 MED ORDER — MORPHINE SULFATE (PF) 4 MG/ML IV SOLN: 4.0000 mg | INTRAVENOUS | Status: DC | PRN

## 2022-12-30 MED ORDER — MORPHINE SULFATE (PF) 4 MG/ML IV SOLN
4.0000 mg | INTRAVENOUS | Status: DC | PRN
  Administered 2022-12-30 – 2023-01-02 (×11): 4 mg via INTRAVENOUS
  Filled 2022-12-30 (×11): qty 1

## 2022-12-30 MED ORDER — IOHEXOL 350 MG/ML SOLN
75.0000 mL | Freq: Once | INTRAVENOUS | Status: AC | PRN
  Administered 2022-12-30: 75 mL via INTRAVENOUS

## 2022-12-30 MED ORDER — DIATRIZOATE MEGLUMINE & SODIUM 66-10 % PO SOLN
90.0000 mL | Freq: Once | ORAL | Status: AC
  Administered 2022-12-30: 90 mL via NASOGASTRIC
  Filled 2022-12-30: qty 90

## 2022-12-30 MED ORDER — MELATONIN 3 MG PO TABS
3.0000 mg | ORAL_TABLET | Freq: Every evening | ORAL | Status: DC | PRN
  Filled 2022-12-30: qty 1

## 2022-12-30 MED ORDER — ACETAMINOPHEN 10 MG/ML IV SOLN
1000.0000 mg | Freq: Four times a day (QID) | INTRAVENOUS | Status: AC
  Administered 2022-12-30 – 2022-12-31 (×4): 1000 mg via INTRAVENOUS
  Filled 2022-12-30 (×4): qty 100

## 2022-12-30 NOTE — Hospital Course (Addendum)
Ssurgery calling back  Mult ssbo  2-3 days   Friday, steak, potato, salad, small portion of food, cut it up well like usual then pain  Vomiting in ED this morning, no blood, x2  Tried manual disimpact with blood on finger Also tried enema  Watery stools, like usual 9/16. Feeling a bit better with pain. Difficulty ambulating w/ NGT. No gas, no BM. No vomiting.      Sugeries in [place], bad Sbo 2019,  severe lumbar degenerative disc disease requiring laminectomy and lumbar decompression x3 complicated by bowel perforation.  atient with Hx of perforation of small bowel in 2017, for which the patient had a small bowel resection, ileocecectomy, and ileostomy with subsequent ileostomy takedown in 2018.  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 Roderic Ovens (pain Monday) Accupuncture  [place]( urology)

## 2022-12-30 NOTE — Consult Note (Signed)
[facility] Surgery Consult Note  [name] 1966/02/01  [id number].    Requesting MD: Mercie Eon Chief Complaint/Reason for Consult: SBO  HPI:  [name] is a 57 y.o. 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 In 2018 he underwent ileostomy takedown. He was admitted most recently in 2022 with SBO that resolved with conservative measures. States that he has been doing well since then until about 2 days ago. He reports generalized abdominal pain that has become more severe. Pain is worse on the right than the left. Associated with nausea and vomiting. He was constipated and tried taking some laxatives. He did have a small soft BM yesterday. No flatus today. CT scan today shows abnormal dilatation of the mid and distal small bowel  loops with transition point in the right upper quadrant of the abdomen where there is mildly thickened ileum just proximal to the enterocolonic anastomosis compatible with small bowel obstruction. Patient was admitted to the medical service. NG tube placed. General surgery asked to see.  Anticoagulants: none Smokes 1/3 PPD homemade cigarettes Cannot remember when his last colonoscopy was, likely has been several years   No family history on file.  Past Medical History:  Diagnosis Date   Anxiety    panic attacks   Asthma    Complication of anesthesia    " I got confused "   Foot drop, left    Headache    Migraine   History of blood transfusion    Ischemic bowel disease (HCC)    Pneumonia    in past   Septic shock (HCC) 01/2016   Small bowel perforation (HCC) 01/2016    Past Surgical History:  Procedure Laterality Date   APPLICATION OF WOUND VAC N/A 02/08/2016   Procedure: APPLICATION OF WOUND VAC;  Surgeon: Jimmye Norman, MD;  Location: [facility] OR;  Service: General;  Laterality: N/A;   BACK SURGERY     x3    BOWEL RESECTION N/A 01/27/2016   Procedure: SMALL BOWEL RESECTION;  Surgeon: Gaynelle Adu, MD;  Location: [facility] OR;  Service: General;  Laterality: N/A;   EXPLORATORY LAPAROTOMY  01/27/2016   ILEOCECETOMY N/A 02/08/2016   Procedure: Ferne Coe;  Surgeon: Jimmye Norman, MD;  Location: [facility] OR;  Service: General;  Laterality: N/A;   ILEOSTOMY N/A 02/10/2016   Procedure: ILEOSTOMY;  Surgeon: Jimmye Norman, MD;  Location: [facility] OR;  Service: General;  Laterality: N/A;   ILEOSTOMY CLOSURE N/A 06/07/2016   Procedure: ILEOSTOMY TAKEDOWN;  Surgeon: Jimmye Norman, MD;  Location: [facility] OR;  Service: General;  Laterality: N/A;   LAPAROTOMY N/A 01/27/2016   Procedure: EXPLORATORY LAPAROTOMY;  Surgeon: Gaynelle Adu, MD;  Location: [facility] OR;  Service: General;  Laterality: N/A;   LAPAROTOMY N/A 02/08/2016   Procedure: EXPLORATORY LAPAROTOMY;  Surgeon: Jimmye Norman, MD;  Location: [facility] OR;  Service: General;  Laterality: N/A;   LAPAROTOMY N/A 02/10/2016   Procedure: EXPLORATORY LAPAROTOMY;  Surgeon: Jimmye Norman, MD;  Location: [facility] OR;  Service: General;  Laterality: N/A;   VACUUM ASSISTED CLOSURE CHANGE N/A 02/12/2016   Procedure: EXPLORATORY LAPAROTOMY WITH CLOSURE;  Surgeon: Harriette Bouillon, MD;  Location: [facility] OR;  Service: General;  Laterality: N/A;    Social History:  reports that he quit smoking about 6 years ago. His smoking use included cigarettes. He started smoking about 40 years ago. He has a 17 pack-year smoking history. He has never used smokeless  tobacco. He reports that he does not drink alcohol and does not use drugs.  Allergies:  Allergies  Allergen Reactions   Celecoxib Other (See Comments)    IRRITABILITY  Other reaction(s): IRRITABILITY   Clonazepam Itching   Cyclobenzaprine Other (See Comments)    IRRITABILITY  Other reaction(s): IRRITABILITY   Dexamethasone Other (See Comments)    IRRITABILITY  Other reaction(s): IRRITABILITY   Diazepam Other (See Comments)    IRRITABILITY  Other reaction(s):  IRRITABILITY   Etodolac Other (See Comments)    IRRITABILITY  Other reaction(s): IRRITABILITY   Gabapentin Other (See Comments)    IRRITABILITY  Other reaction(s): IRRITABILITY   Latex Itching and Rash   Metaxalone Other (See Comments)    IRRITABILITY (Skelaxin)  Other reaction(s): IRRITABILITY   Methadone Other (See Comments)    IRRITABILITY  Other reaction(s): IRRITABILITY   Methocarbamol Other (See Comments)    IRRITABILITY  Other reaction(s): IRRITABILITY   Other Other (See Comments) and Rash    PLASTIC TAPE TAKES OFF 1-2 LAYERS OF [name] SKIN   Pregabalin Other (See Comments)    Aggression and irritability  Other reaction(s): IRRITABILITY   Propoxyphene Other (See Comments)    IRRITABILITY  Other reaction(s): IRRITABILITY   Tape Other (See Comments) and Rash    PLASTIC TAPE TAKES OFF 1-2 LAYERS OF [name] SKIN   Tramadol Other (See Comments)    IRRITABILITY  Other reaction(s): IRRITABILITY   Wound Dressing Adhesive Other (See Comments) and Rash    PLASTIC TAPE TAKES OFF 1-2 LAYERS OF [name] SKIN, also   Aspirin Other (See Comments)    Cannot take this again- affects bowels- Tylenol ONLY   Oxycodone Hcl Other (See Comments)    Made the patient feel "uncomfortable"   Motrin [Ibuprofen] Other (See Comments)    Cannot take this again- affects bowels- Tylenol ONLY   Oxycodone Other (See Comments)    Made the patient feel "uncomfortable"    Medications Prior to Admission  Medication Sig Dispense Refill   acetaminophen (TYLENOL) 500 MG tablet Take 500-1,000 mg by mouth every 6 (six) hours as needed for mild pain.     melatonin 5 MG TABS Take 1 tablet (5 mg total) by mouth at bedtime as needed (for sleep). 30 tablet 0    Prior to Admission medications   Medication Sig Start Date End Date Taking? Authorizing Provider  acetaminophen (TYLENOL) 500 MG tablet Take 500-1,000 mg by mouth every 6 (six) hours as needed for mild pain.   Yes [provider]   melatonin 5 MG TABS Take 1 tablet (5 mg total) by mouth at bedtime as needed (for sleep). 04/03/21  Yes Carlyn Reichert, MD    Blood pressure (!) 124/59, pulse 71, temperature 98.1 F (36.7 C), temperature source Oral, resp. rate 18, weight 76.8 kg, SpO2 97%. Physical Exam: General: pleasant, WD/WN male who is laying in bed in NAD HEENT: head is normocephalic, atraumatic.  Sclera are noninjected.  Pupils equal and round.  Ears and nose without any masses or lesions.  Mouth is pink and moist. Dentition fair Heart: regular, rate, and rhythm Lungs: Respiratory effort nonlabored on room air Abd: soft, mild distension, mild generalized tenderness without rebound or guarding. Few bowel sounds heard. no masses, hernias, or organomegal. multiple scars noted from previous surgeries  Skin: warm and dry with no masses, lesions, or rashes  Results for orders placed or performed during the hospital encounter of 12/30/22 (from the past 48 hour(s))  Lipase, blood  Status: None   Collection Time: 12/30/22  1:49 AM  Result Value Ref Range   Lipase 35 11 - 51 U/L    Comment: Performed at [facility], [address] [place], [place] [address]  Comprehensive metabolic panel     Status: Abnormal   Collection Time: 12/30/22  1:49 AM  Result Value Ref Range   Sodium 135 135 - 145 mmol/L   Potassium 3.4 (L) 3.5 - 5.1 mmol/L   Chloride 99 98 - 111 mmol/L   CO2 25 22 - 32 mmol/L   Glucose, Bld 107 (H) 70 - 99 mg/dL    Comment: Glucose reference range applies only to samples taken after fasting for at least 8 hours.   BUN 11 6 - 20 mg/dL   Creatinine, Ser 4.69 0.61 - 1.24 mg/dL   Calcium 9.0 8.9 - 62.9 mg/dL   Total Protein 7.5 6.5 - 8.1 g/dL   Albumin 3.9 3.5 - 5.0 g/dL   AST 22 15 - 41 U/L   ALT 17 0 - 44 U/L   Alkaline Phosphatase 86 38 - 126 U/L   Total Bilirubin 0.4 0.3 - 1.2 mg/dL   GFR, Estimated >52 >84 mL/min    Comment: (NOTE) Calculated using the CKD-EPI Creatinine Equation  (2021)    Anion gap 11 5 - 15    Comment: Performed at [facility], [address], [place], [place] [address]  CBC     Status: Abnormal   Collection Time: 12/30/22  1:49 AM  Result Value Ref Range   WBC 16.1 (H) 4.0 - 10.5 K/uL   RBC 4.29 4.22 - 5.81 MIL/uL   Hemoglobin 12.5 (L) 13.0 - 17.0 g/dL   HCT 01.0 (L) 27.2 - 53.6 %   MCV 89.3 80.0 - 100.0 fL   MCH 29.1 26.0 - 34.0 pg   MCHC 32.6 30.0 - 36.0 g/dL   RDW 64.4 03.4 - 74.2 %   Platelets 272 150 - 400 K/uL   nRBC 0.0 0.0 - 0.2 %    Comment: Performed at [facility], [address], [place], [place] [address]   DG Abd 1 View  Result Date: 12/30/2022 CLINICAL DATA:  Evaluate nasogastric tube placement. EXAM: ABDOMEN - 1 VIEW COMPARISON:  CT from earlier today FINDINGS: Enteric tube tip and side port are below the level of the GE junction in the expected location of the gastric fundus. Visualized portions of the bowel gas pattern appear unchanged from CT performed earlier today. Postoperative change within the lower thoracic spine. IMPRESSION: Enteric tube tip and side port are below the level of the GE junction in the expected location of the gastric fundus. Electronically Signed   By: Signa Kell M.D.   On: 12/30/2022 09:41   CT ABDOMEN PELVIS W CONTRAST  Result Date: 12/30/2022 CLINICAL DATA:  Abdominal pain, acute.  History of SBO. EXAM: CT ABDOMEN AND PELVIS WITH CONTRAST TECHNIQUE: Multidetector CT imaging of the abdomen and pelvis was performed using the standard protocol following bolus administration of intravenous contrast. RADIATION DOSE REDUCTION: This exam was performed according to the departmental dose-optimization program which includes automated exposure control, adjustment of the mA and/or kV according to patient size and/or use of iterative reconstruction technique. CONTRAST:  75mL OMNIPAQUE IOHEXOL 350 MG/ML SOLN COMPARISON:  03/31/2021 FINDINGS: Lower chest: Scarring is noted within the left lower lobe.  No acute abnormality. 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 The largest is in segment 2/3 measuring 1.7 cm. These appear unchanged from previous exam. Gallbladder appears normal. No bile duct dilatation. Pancreas: Unremarkable. No pancreatic ductal dilatation or surrounding inflammatory changes. Spleen: Normal in size without focal abnormality. Adrenals/Urinary Tract: Normal adrenal glands. Inferior pole right kidney stones measure up to 6 mm. Unchanged. No mass or hydronephrosis identified bilaterally. Urinary bladder appears within normal limits. Stomach/Bowel: Stomach appears normal. Proximal small bowel loops are unremarkable. Postoperative changes from right hemicolectomy with right upper quadrant enterocolonic anastomosis. There is abnormal dilatation of the mid and distal small bowel loops with transition point in the right upper quadrant of the abdomen where there is mildly thickened ileum just proximal to the enterocolonic anastomosis, image 43/3. Normal caliber of the colon. Vascular/Lymphatic: Normal appearance of the abdominal aorta. No enlarged abdominopelvic lymph nodes. Reproductive: Prostate is unremarkable. Other: Trace fluid is seen along the right paracolic gutter. No focal fluid collections. No signs of pneumoperitoneum. Musculoskeletal: No acute or significant osseous findings. Postoperative changes are identified within the lower thoracic and lumbar spine. No acute or suspicious osseous findings. IMPRESSION: 1. There is abnormal dilatation of the mid and distal small bowel loops with transition point in the right upper quadrant of the abdomen where there is mildly thickened ileum just proximal to the enterocolonic anastomosis. Findings are compatible with small bowel obstruction. 2. Trace fluid is seen along the right paracolic gutter. No focal fluid collections. No signs of pneumoperitoneum. 3.  Nonobstructing right kidney stones. 4. 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 Electronically Signed   By: Signa Kell M.D.   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 In 2018 he underwent ileostomy takedown. Last SBO 2022 - CT scan today shows abnormal dilatation of the mid and distal small bowel  loops with transition point in the right upper quadrant of the abdomen where there is mildly thickened ileum just proximal to the enterocolonic anastomosis compatible with small bowel obstruction - No role for acute surgical intervention. Agree with NG tube decompression. Will start patient on SBO protocol. Hopefully this will resolve again with conservative measures.  ID - none VTE - SCDs, sq heparin FEN - IVF, NPO/NGT to LIWS. Replace K Foley - none   I reviewed last 24 h vitals and pain scores, last 48 h intake and output, last 24 h labs and trends, and last 24 h imaging results.   Franne Forts, PA-C [facility] Surgery 12/30/2022, 11:47 AM Please see Amion for pager number during day hours 7:00am-4:30pm

## 2022-12-30 NOTE — ED Triage Notes (Signed)
Patient reports pain across abdomen yesterday with nausea and constipation .

## 2022-12-30 NOTE — ED Provider Notes (Signed)
[place] [facility] AT [facility] Provider Note   CSN: [id number] Arrival date & time: 12/30/22  0134     History  Chief Complaint  Patient presents with   Abdominal Pain    [name] is a 57 y.o. male with past medical history asthma, anxiety, multiple previous SBO's who presents to the ED complaining of severe, generalized abdominal pain that started 2 days ago with constipation.  Reports trying to have a bowel movement this morning and being unable to do so.  Unsure when his last regular bowel movement was.  Had an episode of emesis today.  No fevers.       Home Medications Prior to Admission medications   Medication Sig Start Date End Date Taking? Authorizing Provider  acetaminophen (TYLENOL) 500 MG tablet Take 500-1,000 mg by mouth every 6 (six) hours as needed for mild pain.    [provider]  melatonin 5 MG TABS Take 1 tablet (5 mg total) by mouth at bedtime as needed (for sleep). 04/03/21   Carlyn Reichert, MD      Allergies    Celecoxib, Cyclobenzaprine, Dexamethasone, Diazepam, Etodolac, Gabapentin, Metaxalone, Methocarbamol, Pregabalin, Propoxyphene, Tape, Tramadol, Wound dressing adhesive, Clonazepam, Aspirin, Motrin [ibuprofen], Oxycodone, and Latex    Review of Systems   Review of Systems  All other systems reviewed and are negative.   Physical Exam Updated Vital Signs BP 122/68 (BP Location: Left Arm)   Pulse 73   Temp 98.4 F (36.9 C) (Oral)   Resp 18   SpO2 97%  Physical Exam Vitals and nursing note reviewed.  Constitutional:      General: He is in acute distress.     Appearance: Normal appearance.  HENT:     Head: Normocephalic and atraumatic.     Mouth/Throat:     Mouth: Mucous membranes are moist.  Eyes:     Conjunctiva/sclera: Conjunctivae normal.  Cardiovascular:     Rate and Rhythm: Normal rate and regular rhythm.     Heart sounds: No murmur heard. Pulmonary:     Effort: Pulmonary effort is  normal.     Breath sounds: Normal breath sounds.  Abdominal:     General: Abdomen is flat.     Palpations: Abdomen is soft.     Tenderness: There is generalized abdominal tenderness. There is guarding. There is no right CVA tenderness or left CVA tenderness.  Musculoskeletal:        General: Normal range of motion.     Cervical back: Neck supple.     Right lower leg: No edema.     Left lower leg: No edema.  Skin:    General: Skin is warm and dry.     Capillary Refill: Capillary refill takes less than 2 seconds.  Neurological:     General: No focal deficit present.     Mental Status: He is alert and oriented to person, place, and time. Mental status is at baseline.  Psychiatric:        Mood and Affect: Mood is anxious.        Speech: Speech normal.        Behavior: Behavior is cooperative.     ED Results / Procedures / Treatments   Labs (all labs ordered are listed, but only abnormal results are displayed) Labs Reviewed  COMPREHENSIVE METABOLIC PANEL - Abnormal; Notable for the following components:      Result Value   Potassium 3.4 (*)    Glucose, Bld 107 (*)  All other components within normal limits  CBC - Abnormal; Notable for the following components:   WBC 16.1 (*)    Hemoglobin 12.5 (*)    HCT 38.3 (*)    All other components within normal limits  LIPASE, BLOOD  URINALYSIS, ROUTINE W REFLEX MICROSCOPIC    EKG None  Radiology CT ABDOMEN PELVIS W CONTRAST  Result Date: 12/30/2022 CLINICAL DATA:  Abdominal pain, acute.  History of SBO. EXAM: CT ABDOMEN AND PELVIS WITH CONTRAST TECHNIQUE: Multidetector CT imaging of the abdomen and pelvis was performed using the standard protocol following bolus administration of intravenous contrast. RADIATION DOSE REDUCTION: This exam was performed according to the departmental dose-optimization program which includes automated exposure control, adjustment of the mA and/or kV according to patient size and/or use of iterative  reconstruction technique. CONTRAST:  75mL OMNIPAQUE IOHEXOL 350 MG/ML SOLN COMPARISON:  03/31/2021 FINDINGS: Lower chest: Scarring is noted within the left lower lobe. No acute abnormality. 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 The largest is in segment 2/3 measuring 1.7 cm. These appear unchanged from previous exam. Gallbladder appears normal. No bile duct dilatation. Pancreas: Unremarkable. No pancreatic ductal dilatation or surrounding inflammatory changes. Spleen: Normal in size without focal abnormality. Adrenals/Urinary Tract: Normal adrenal glands. Inferior pole right kidney stones measure up to 6 mm. Unchanged. No mass or hydronephrosis identified bilaterally. Urinary bladder appears within normal limits. Stomach/Bowel: Stomach appears normal. Proximal small bowel loops are unremarkable. Postoperative changes from right hemicolectomy with right upper quadrant enterocolonic anastomosis. There is abnormal dilatation of the mid and distal small bowel loops with transition point in the right upper quadrant of the abdomen where there is mildly thickened ileum just proximal to the enterocolonic anastomosis, image 43/3. Normal caliber of the colon. Vascular/Lymphatic: Normal appearance of the abdominal aorta. No enlarged abdominopelvic lymph nodes. Reproductive: Prostate is unremarkable. Other: Trace fluid is seen along the right paracolic gutter. No focal fluid collections. No signs of pneumoperitoneum. Musculoskeletal: No acute or significant osseous findings. Postoperative changes are identified within the lower thoracic and lumbar spine. No acute or suspicious osseous findings. IMPRESSION: 1. There is abnormal dilatation of the mid and distal small bowel loops with transition point in the right upper quadrant of the abdomen where there is mildly thickened ileum just proximal to the enterocolonic anastomosis. Findings  are compatible with small bowel obstruction. 2. Trace fluid is seen along the right paracolic gutter. No focal fluid collections. No signs of pneumoperitoneum. 3. Nonobstructing right kidney stones. 4. 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 Electronically Signed   By: Signa Kell M.D.   On: 12/30/2022 08:10    Procedures Procedures    Medications Ordered in ED Medications  morphine (PF) 4 MG/ML injection 4 mg (has no administration in time range)  sodium chloride 0.9 % bolus 1,000 mL (has no administration in time range)  morphine (PF) 4 MG/ML injection 4 mg (4 mg Intravenous Given 12/30/22 0722)  ondansetron (ZOFRAN) injection 4 mg (4 mg Intravenous Given 12/30/22 0722)  iohexol (OMNIPAQUE) 350 MG/ML injection 75 mL (75 mLs Intravenous Contrast Given 12/30/22 0754)    ED Course/ Medical Decision Making/ A&P                                 Medical Decision Making Amount and/or Complexity of Data Reviewed Labs: ordered.  Decision-making details documented in ED Course. Radiology: ordered. Decision-making details documented in ED Course.  Risk Prescription drug management. Decision regarding hospitalization.   Medical Decision Making:   [name] is a 57 y.o. male who presented to the ED today with abdominal pain detailed above.    Additional history discussed with patient's family/caregivers.  [name] presentation is complicated by their history of multiple abdominal surgeries, SBO.  Complete initial physical exam performed, notably the patient  was in acute distress secondary to pain with generalized abdominal tenderness.    Reviewed and confirmed nursing documentation for past medical history, family history, social history.    Initial Assessment:   With the patient's presentation of abdominal pain, differential diagnosis includes but is not limited to AAA, mesenteric ischemia,  appendicitis, diverticulitis, DKA, gastritis, gastroenteritis, AMI, nephrolithiasis, pancreatitis, peritonitis, adrenal insufficiency, intestinal ischemia, constipation, UTI, SBO/LBO, splenic rupture, biliary disease, IBD, IBS, PUD, hepatitis, STD, ovarian/testicular torsion, electrolyte disturbance, DKA, dehydration, acute kidney injury, renal failure, cholecystitis, cholelithiasis, choledocholithiasis, abdominal pain of  unknown etiology.    Initial Plan:  Screening labs including CBC and Metabolic panel to evaluate for infectious or metabolic etiology of disease.  Lipase to evaluate for pancreatitis Urinalysis with reflex culture ordered to evaluate for UTI or relevant urologic/nephrologic pathology.  CT abd/pelvis to evaluate for intra-abdominal pathology Symptomatic management Objective evaluation as reviewed   Initial Study Results:   Laboratory  All laboratory results reviewed without evidence of clinically relevant pathology.   Exceptions include: Potassium 3.4, WBC 16.1, hemoglobin 12.5  Radiology:  All images reviewed independently. Agree with radiology report at this time.   CT ABDOMEN PELVIS W CONTRAST  Result Date: 12/30/2022 CLINICAL DATA:  Abdominal pain, acute.  History of SBO. EXAM: CT ABDOMEN AND PELVIS WITH CONTRAST TECHNIQUE: Multidetector CT imaging of the abdomen and pelvis was performed using the standard protocol following bolus administration of intravenous contrast. RADIATION DOSE REDUCTION: This exam was performed according to the departmental dose-optimization program which includes automated exposure control, adjustment of the mA and/or kV according to patient size and/or use of iterative reconstruction technique. CONTRAST:  75mL OMNIPAQUE IOHEXOL 350 MG/ML SOLN COMPARISON:  03/31/2021 FINDINGS: Lower chest: Scarring is noted within the left lower lobe. No acute abnormality. Hepatobiliary: There are several low-attenuation lesions within the liver, previously  characterized by MRI as multiple hemangiomas. The largest is in segment 2/3 measuring 1.7 cm. These appear unchanged from previous exam. Gallbladder appears normal. No bile duct dilatation. Pancreas: Unremarkable. No pancreatic ductal dilatation or surrounding inflammatory changes. Spleen: Normal in size without focal abnormality. Adrenals/Urinary Tract: Normal adrenal glands. Inferior pole right kidney stones measure up to 6 mm. Unchanged. No mass or hydronephrosis identified bilaterally. Urinary bladder appears within normal limits. Stomach/Bowel: Stomach appears normal. Proximal small bowel loops are unremarkable. Postoperative changes from right hemicolectomy with right upper quadrant enterocolonic anastomosis. There is abnormal dilatation of the mid and distal small bowel loops with transition point in the right upper quadrant of the abdomen where there is mildly thickened ileum just proximal to the enterocolonic anastomosis, image 43/3. Normal caliber of the colon. Vascular/Lymphatic: Normal appearance of the abdominal aorta. No enlarged abdominopelvic lymph nodes. Reproductive: Prostate is unremarkable. Other: Trace fluid is seen along the right paracolic gutter. No focal fluid collections. No signs of pneumoperitoneum. Musculoskeletal: No acute or significant osseous findings. Postoperative changes are identified within the lower thoracic and lumbar spine. No acute or suspicious osseous findings. IMPRESSION: 1. There is abnormal dilatation of the mid and  distal small bowel loops with transition point in the right upper quadrant of the abdomen where there is mildly thickened ileum just proximal to the enterocolonic anastomosis. Findings are compatible with small bowel obstruction. 2. Trace fluid is seen along the right paracolic gutter. No focal fluid collections. No signs of pneumoperitoneum. 3. Nonobstructing right kidney stones. 4. Stable appearance of low-attenuation lesions within the liver, previously  characterized by MRI as multiple hemangiomas. Electronically Signed   By: Signa Kell M.D.   On: 12/30/2022 08:10      Consults: Case discussed with Dr. Derrill Kay with internal medicine who will admit.  Discussed with Dr. Donell Beers with surgery who will consult and recommended NG tube.   Final Assessment and Plan:   57 year old male presents the ED with abdominal pain and constipation.  Appears very uncomfortable on initial exam.  Workup initiated as above.  Has a mild leukocytosis of 16.1.  No other significant lab findings.  CT shows a small bowel obstruction with transition point in the right upper quadrant.  Patient with history of multiple SBO's before, some manage medically, some surgically.  Has a history of significant adhesions.  Discussed with surgery and due to the level of dilatation will place NG tube.  Nursing staff aware that patient will need to be placed in the room with suction.  Discussed with internal medicine team who will admit.  Patient agreeable with plan for admission.  Given morphine for pain control.  Stable at time of admission.   Clinical Impression:  1. Small bowel obstruction [facility] [place])      Admit           Final Clinical Impression(s) / ED Diagnoses Final diagnoses:  Small bowel obstruction [facility]    Rx / DC Orders ED Discharge Orders     None         Tonette Lederer, PA-C 12/30/22 0850    Wynetta Fines, MD 12/30/22 1615

## 2022-12-30 NOTE — H&P (Addendum)
Date: 12/30/2022               Patient Name:  [name] MRN: [id number]  DOB: 01-Oct-1965 Age / Sex: 57 y.o., male   PCP: [facility]         Medical Service: Internal Medicine Teaching Service         Attending Physician: Dr. Mercie Eon, MD      First Contact: Dr. Katheran James, DO Pager [phone]    Second Contact: Dr. Rocky Morel, DO Pager [phone]         After Hours (After 5p/  First Contact Pager: [phone]  weekends / holidays): Second Contact Pager: [phone]   SUBJECTIVE   Chief Complaint: Abdominal Pain  History of Present Illness:  Mr. Toddrick Gaul is a 57 year old gentleman with a past medical history of anxiety, asthma, and a previous SBOs in 2019 and 2022 that were treated conservatively.  Bowel obstruction was believed to be secondary to small bowel rupture in 2017 that was treated with 3 successive surgeries.  He presented to [facility] ED complaining of symptoms that remind him of his previous SBOs.  He had 10 out of 10 bilateral lower abdominal pain as well as nausea and constipation.  The symptoms started 2 days ago after a small steak dinner.  He did have 2 episodes of non-bloody emesis in the ED this morning, but none previously.  When he is able to pass stool it is usually watery, a chronic issue for him.  He noted bright red blood on his fingertip while trying to get himself an enema, although this is also typical for him.  His pain is now 7 out of 10 having received morphine.  He reports a history of severe lumbar degenerative disc disease requiring laminectomy and lumbar decompression 3 times in the late 2010s.  As a result, he has chronic back pain and abdominal pain for which he see's pain doctors and acupuncture, watery stools, and neurogenic bladder for which he sees a urologist. This was complicated by bowel perforation in 2017 for which she had small bowel resection, ileocecectomy, and ileostomy with subsequent ileostomy  takedown in 2019. His 2022 SBO was treated in this hospital.  ED Course: He presented to [facility] ED at around 140 this morning.  Vital signs were stable including normotensive and afebrile.  His exam found him to be in acute distress with generalized abdominal tenderness and guarding.  Blood count revealed leukocytosis at 16.1 and hemoglobin of 12.5.  Chemistries revealed potassium of 3.4 and otherwise unremarkable.  Lipase was unremarkable, urinalysis and HIV antibody are pending.  He had 2 episodes of nonbloody emesis and was given morphine and Zofran for pain control.  CT abdomen pelvis is compatible with small bowel obstruction revealing dilation of the mid and distal small bowel with transition point in the ileum just proximal to the enterocolonic anastomosis.  There is no sign of pneumoperitoneum.  There are nonobstructing right kidney stones.  An NG tube was placed for decompression and the medicine and surgical services were consulted for management of SBO.  Past Medical History Past Medical History:  Diagnosis Date   Anxiety    panic attacks   Asthma    Complication of anesthesia    " I got confused "   Foot drop, left    Headache    Migraine   History of blood transfusion    Ischemic bowel disease (HCC)    Pneumonia  in past   Septic shock (HCC) 01/2016   Small bowel perforation (HCC) 01/2016     Meds:  Imodium  Excedrin migraine Endocet 10-325 mg 5 daily (30 days filled 11/09/2022)  Past Surgical History Past Surgical History:  Procedure Laterality Date   APPLICATION OF WOUND VAC N/A 02/08/2016   Procedure: APPLICATION OF WOUND VAC;  Surgeon: Jimmye Norman, MD;  Location: [facility] OR;  Service: General;  Laterality: N/A;   BACK SURGERY     x3   BOWEL RESECTION N/A 01/27/2016   Procedure: SMALL BOWEL RESECTION;  Surgeon: Gaynelle Adu, MD;  Location: [facility] OR;  Service: General;  Laterality: N/A;   EXPLORATORY LAPAROTOMY  01/27/2016   ILEOCECETOMY N/A 02/08/2016   Procedure:  Ferne Coe;  Surgeon: Jimmye Norman, MD;  Location: [facility] OR;  Service: General;  Laterality: N/A;   ILEOSTOMY N/A 02/10/2016   Procedure: ILEOSTOMY;  Surgeon: Jimmye Norman, MD;  Location: [facility] OR;  Service: General;  Laterality: N/A;   ILEOSTOMY CLOSURE N/A 06/07/2016   Procedure: ILEOSTOMY TAKEDOWN;  Surgeon: Jimmye Norman, MD;  Location: [facility] OR;  Service: General;  Laterality: N/A;   LAPAROTOMY N/A 01/27/2016   Procedure: EXPLORATORY LAPAROTOMY;  Surgeon: Gaynelle Adu, MD;  Location: [facility] OR;  Service: General;  Laterality: N/A;   LAPAROTOMY N/A 02/08/2016   Procedure: EXPLORATORY LAPAROTOMY;  Surgeon: Jimmye Norman, MD;  Location: [facility] OR;  Service: General;  Laterality: N/A;   LAPAROTOMY N/A 02/10/2016   Procedure: EXPLORATORY LAPAROTOMY;  Surgeon: Jimmye Norman, MD;  Location: [facility] OR;  Service: General;  Laterality: N/A;   VACUUM ASSISTED CLOSURE CHANGE N/A 02/12/2016   Procedure: EXPLORATORY LAPAROTOMY WITH CLOSURE;  Surgeon: Harriette Bouillon, MD;  Location: [facility] OR;  Service: General;  Laterality: N/A;    Social:  Lives at home with wife Occupation: Unemployed, disability/workman's comp Support: per local family Level of Function: chronic pain, independent in ADLs/IADLs PCP: [facility] Substances: 1/2ppd smoker who rolls his own cigarettes, quit once before and restared in July. Started smoking at age 21 as many as 3ppd, approximate 80 pack year history. Drinks beer 1-2x per week. Rare marijuana chews for stomach relief. No other subtances.  Family History:  Reports a family history of "every type of cancer" but no history of clotting disorders or lymph/leuk.  Allergies: Allergies as of 12/30/2022 - Review Complete 12/30/2022  Allergen Reaction Noted   Celecoxib Other (See Comments) 09/28/2013   Clonazepam Itching 01/21/2014   Cyclobenzaprine Other (See Comments) 09/28/2013   Dexamethasone Other (See Comments) 09/28/2013   Diazepam Other (See Comments) 09/28/2013   Etodolac  Other (See Comments) 09/28/2013   Gabapentin Other (See Comments) 09/28/2013   Latex Itching and Rash 05/30/2016   Metaxalone Other (See Comments) 09/28/2013   Methadone Other (See Comments) 09/28/2013   Methocarbamol Other (See Comments) 09/28/2013   Other Other (See Comments) and Rash 06/05/2016   Pregabalin Other (See Comments) 09/28/2013   Propoxyphene Other (See Comments) 09/28/2013   Tape Other (See Comments) and Rash 06/05/2016   Tramadol Other (See Comments) 09/28/2013   Wound dressing adhesive Other (See Comments) and Rash 06/05/2016   Aspirin Other (See Comments) 03/31/2021   Oxycodone hcl Other (See Comments) 03/31/2021   Motrin [ibuprofen] Other (See Comments) 03/31/2021   Oxycodone Other (See Comments) 03/31/2021    Review of Systems: A complete ROS was negative except as per HPI.   OBJECTIVE:   Physical Exam: Blood pressure 133/69, pulse 64, temperature 99.1 F (37.3 C), temperature source Oral, resp. rate 18,  SpO2 97%.  Constitutional: In no acute distress. HENT: Normocephalic, atraumatic. NG tube in place and suction on. Eyes: Sclera non-icteric, PERRL, EOM intact Neck:normal atraumatic, no neck masses, normal thyroid, no jvd Cardio:Regular rate and rhythm. No murmurs, rubs, or gallops. 2+ bilateral radial and dorsalis pedis  pulses. Pulm:Clear to auscultation bilaterally. Normal work of breathing on room air. Abdomen: Firm but not distended, diffusely tender and most severe at lower quadrants, no rebound, positive bowel sounds. WUJ:[id number] for extremity edema. Skin:Warm and dry. Neuro:Alert and oriented x3. No focal deficit noted. Psych:Pleasant mood and affect.  Labs: CBC    Component Value Date/Time   WBC 16.1 (H) 12/30/2022 0149   RBC 4.29 12/30/2022 0149   HGB 12.5 (L) 12/30/2022 0149   HGB 12.5 (L) 11/07/2016 1340   HCT 38.3 (L) 12/30/2022 0149   HCT 37.4 (L) 11/07/2016 1340   PLT 272 12/30/2022 0149   PLT 167 11/07/2016 1340   MCV 89.3  12/30/2022 0149   MCV 90.3 11/07/2016 1340   MCH 29.1 12/30/2022 0149   MCHC 32.6 12/30/2022 0149   RDW 13.6 12/30/2022 0149   RDW 12.4 11/07/2016 1340   LYMPHSABS 2.0 07/28/2019 1127   LYMPHSABS 1.5 11/07/2016 1340   MONOABS 0.6 07/28/2019 1127   MONOABS 0.8 11/07/2016 1340   EOSABS 0.2 07/28/2019 1127   EOSABS 0.3 11/07/2016 1340   BASOSABS 0.0 07/28/2019 1127   BASOSABS 0.0 11/07/2016 1340     CMP     Component Value Date/Time   NA 135 12/30/2022 0149   NA 143 11/07/2016 1340   K 3.4 (L) 12/30/2022 0149   K 4.1 11/07/2016 1340   CL 99 12/30/2022 0149   CO2 25 12/30/2022 0149   CO2 30 (H) 11/07/2016 1340   GLUCOSE 107 (H) 12/30/2022 0149   GLUCOSE 87 11/07/2016 1340   BUN 11 12/30/2022 0149   BUN 3.2 (L) 11/07/2016 1340   CREATININE 0.81 12/30/2022 0149   CREATININE 1.0 11/07/2016 1340   CALCIUM 9.0 12/30/2022 0149   CALCIUM 10.1 11/07/2016 1340   PROT 7.5 12/30/2022 0149   PROT 7.9 11/07/2016 1340   ALBUMIN 3.9 12/30/2022 0149   ALBUMIN 4.5 11/07/2016 1340   AST 22 12/30/2022 0149   AST 31 11/07/2016 1340   ALT 17 12/30/2022 0149   ALT 30 11/07/2016 1340   ALKPHOS 86 12/30/2022 0149   ALKPHOS 98 11/07/2016 1340   BILITOT 0.4 12/30/2022 0149   BILITOT 0.45 11/07/2016 1340   GFRNONAA >60 12/30/2022 0149   GFRAA >60 06/12/2016 0544    Imaging: DG Abd 1 View  Result Date: 12/30/2022 CLINICAL DATA:  Evaluate nasogastric tube placement. EXAM: ABDOMEN - 1 VIEW COMPARISON:  CT from earlier today FINDINGS: Enteric tube tip and side port are below the level of the GE junction in the expected location of the gastric fundus. Visualized portions of the bowel gas pattern appear unchanged from CT performed earlier today. Postoperative change within the lower thoracic spine. IMPRESSION: Enteric tube tip and side port are below the level of the GE junction in the expected location of the gastric fundus. Electronically Signed   By: Signa Kell M.D.   On: 12/30/2022 09:41    CT ABDOMEN PELVIS W CONTRAST  Result Date: 12/30/2022 CLINICAL DATA:  Abdominal pain, acute.  History of SBO. EXAM: CT ABDOMEN AND PELVIS WITH CONTRAST TECHNIQUE: Multidetector CT imaging of the abdomen and pelvis was performed using the standard protocol following bolus administration of intravenous contrast. RADIATION DOSE REDUCTION: This  exam was performed according to the departmental dose-optimization program which includes automated exposure control, adjustment of the mA and/or kV according to patient size and/or use of iterative reconstruction technique. CONTRAST:  75mL OMNIPAQUE IOHEXOL 350 MG/ML SOLN COMPARISON:  03/31/2021 FINDINGS: Lower chest: Scarring is noted within the left lower lobe. No acute abnormality. 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 The largest is in segment 2/3 measuring 1.7 cm. These appear unchanged from previous exam. Gallbladder appears normal. No bile duct dilatation. Pancreas: Unremarkable. No pancreatic ductal dilatation or surrounding inflammatory changes. Spleen: Normal in size without focal abnormality. Adrenals/Urinary Tract: Normal adrenal glands. Inferior pole right kidney stones measure up to 6 mm. Unchanged. No mass or hydronephrosis identified bilaterally. Urinary bladder appears within normal limits. Stomach/Bowel: Stomach appears normal. Proximal small bowel loops are unremarkable. Postoperative changes from right hemicolectomy with right upper quadrant enterocolonic anastomosis. There is abnormal dilatation of the mid and distal small bowel loops with transition point in the right upper quadrant of the abdomen where there is mildly thickened ileum just proximal to the enterocolonic anastomosis, image 43/3. Normal caliber of the colon. Vascular/Lymphatic: Normal appearance of the abdominal aorta. No enlarged abdominopelvic lymph nodes. Reproductive: Prostate is  unremarkable. Other: Trace fluid is seen along the right paracolic gutter. No focal fluid collections. No signs of pneumoperitoneum. Musculoskeletal: No acute or significant osseous findings. Postoperative changes are identified within the lower thoracic and lumbar spine. No acute or suspicious osseous findings. IMPRESSION: 1. There is abnormal dilatation of the mid and distal small bowel loops with transition point in the right upper quadrant of the abdomen where there is mildly thickened ileum just proximal to the enterocolonic anastomosis. Findings are compatible with small bowel obstruction. 2. Trace fluid is seen along the right paracolic gutter. No focal fluid collections. No signs of pneumoperitoneum. 3. Nonobstructing right kidney stones. 4. 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 Electronically Signed   By: Signa Kell M.D.   On: 12/30/2022 08:10     ASSESSMENT & PLAN:   Assessment & Plan by Problem: Principal Problem:   Small bowel obstruction (HCC)   MAJIK PREVOT Schwartz is a 57 y.o. male with pertinent PMH of bowel perforation in 2018 requiring 3 surgeries and 2 subsequent SBOs presented with abdominal pain and is admitted for small bowel obstruction.  Small Bowel Obstruction Patient presenting with 2 days of increasing abdominal pain that is severe, constipation, nausea, and vomiting.  CT abdomen pelvis is consistent with SBO: dilation of the mid and distal small bowel with transition point in the ileum just proximal to the enterocolonic anastomosis.  Diffuse pain on exam but no rebound or distension.  Chemistry has very mild hypokalemia but no other abnormalities. There is leukocytosis, likely reactive in an afebrile patient without other signs of infection - in current indication for ABX. - NPO - IV fluids: LR 144mL/hr - NG tube to suction - IV tylenol and morphine q4h prn for pain control - Gen Surg on  board, thank you  - Plan gastrografin study with continued NG decomp and no acute surgical intervention  Chronic Pain Pt is on Endocet 10-325 five times daily with outpatient management. We are treating pain as above.  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  Insomnia - melatonin 3 mg at night prn  Diet: NPO VTE:  Heparin SubQ IVF: NS Bolus in ED Code: Full  Dispo: Admit patient to Observation with expected length of stay less than 2 midnights.  Signed: Katheran James, DO Internal Medicine Resident PGY-1  12/30/2022, 10:10 AM   Dr. Katheran James, DO Pager [phone]

## 2022-12-30 NOTE — ED Notes (Signed)
ED TO INPATIENT HANDOFF REPORT  ED Nurse Name and Phone #: Einar Grad [phone]  S Name/Age/Gender [place] III 57 y.o. male Room/Bed: 004C/004C  Code Status   Code Status: Full Code  Home/SNF/Other Home Patient oriented to: self, place, time, and situation Is this baseline? Yes   Triage Complete: Triage complete  Chief Complaint Small bowel obstruction (HCC) [K56.609]  Triage Note Patient reports pain across abdomen yesterday with nausea and constipation .    Allergies Allergies  Allergen Reactions   Celecoxib Other (See Comments)    IRRITABILITY    Cyclobenzaprine Other (See Comments)    IRRITABILITY   Dexamethasone Other (See Comments)    IRRITABILITY    Diazepam Other (See Comments)    IRRITABILITY   Etodolac Other (See Comments)    IRRITABILITY   Gabapentin Other (See Comments)    IRRITABILITY   Metaxalone Other (See Comments)    IRRITABILITY (Skelaxin)   Methocarbamol Other (See Comments)    IRRITABILITY   Pregabalin Other (See Comments)    Aggression and irritability   Propoxyphene Other (See Comments)    IRRITABILITY     Tape Rash and Other (See Comments)    PLASTIC TAPE TAKES OFF 1-2 LAYERS OF [name] SKIN   Tramadol Other (See Comments)    IRRITABILITY   Wound Dressing Adhesive Rash and Other (See Comments)    PLASTIC TAPE TAKES OFF 1-2 LAYERS OF [name] SKIN, also   Clonazepam Itching   Aspirin Other (See Comments)    Cannot take this again- affects bowels- Tylenol ONLY   Motrin [Ibuprofen] Other (See Comments)    Cannot take this again- affects bowels- Tylenol ONLY   Oxycodone Other (See Comments)    Made the patient feel "uncomfortable"   Latex Itching and Rash    Level of Care/Admitting Diagnosis ED Disposition     ED Disposition  Admit   Condition  --   Comment  Hospital Area: [facility] [[id number]]  Level of Care: Telemetry Medical [104]  May place patient in observation at [facility] or [facility] if  equivalent level of care is available:: Yes  Covid Evaluation: Asymptomatic - no recent exposure (last 10 days) testing not required  Diagnosis: Small bowel obstruction [facility]) [[id number]]  Admitting Physician: Mercie Eon [[id number]]  Attending Physician: Mercie Eon [[id number]]          B Medical/Surgery History Past Medical History:  Diagnosis Date   Anxiety    panic attacks   Asthma    Complication of anesthesia    " I got confused "   Foot drop, left    Headache    Migraine   History of blood transfusion    Ischemic bowel disease (HCC)    Pneumonia    in past   Septic shock (HCC) 01/2016   Small bowel perforation (HCC) 01/2016   Past Surgical History:  Procedure Laterality Date   APPLICATION OF WOUND VAC N/A 02/08/2016   Procedure: APPLICATION OF WOUND VAC;  Surgeon: Jimmye Norman, MD;  Location: [facility] OR;  Service: General;  Laterality: N/A;   BACK SURGERY     x3   BOWEL RESECTION N/A 01/27/2016   Procedure: SMALL BOWEL RESECTION;  Surgeon: Gaynelle Adu, MD;  Location: [facility] OR;  Service: General;  Laterality: N/A;   EXPLORATORY LAPAROTOMY  01/27/2016   ILEOCECETOMY N/A 02/08/2016   Procedure: Ferne Coe;  Surgeon: Jimmye Norman, MD;  Location: [facility] OR;  Service: General;  Laterality: N/A;   ILEOSTOMY N/A 02/10/2016  Procedure: ILEOSTOMY;  Surgeon: Jimmye Norman, MD;  Location: [facility] OR;  Service: General;  Laterality: N/A;   ILEOSTOMY CLOSURE N/A 06/07/2016   Procedure: ILEOSTOMY TAKEDOWN;  Surgeon: Jimmye Norman, MD;  Location[facility] OR;  Service: General;  Laterality: N/A;   LAPAROTOMY N/A 01/27/2016   Procedure: EXPLORATORY LAPAROTOMY;  Surgeon: Gaynelle Adu, MD;  Location: [facility] OR;  Service: General;  Laterality: N/A;   LAPAROTOMY N/A 02/08/2016   Procedure: EXPLORATORY LAPAROTOMY;  Surgeon: Jimmye Norman, MD;  Location: [facility] OR;  Service: General;  Laterality: N/A;   LAPAROTOMY N/A 02/10/2016   Procedure: EXPLORATORY LAPAROTOMY;  Surgeon: Jimmye Norman, MD;  Location: [facility] OR;  Service:  General;  Laterality: N/A;   VACUUM ASSISTED CLOSURE CHANGE N/A 02/12/2016   Procedure: EXPLORATORY LAPAROTOMY WITH CLOSURE;  Surgeon: Harriette Bouillon, MD;  Location: [facility] OR;  Service: General;  Laterality: N/A;     A IV Location/Drains/Wounds Patient Lines/Drains/Airways Status     Active Line/Drains/Airways     Name Placement date Placement time Site Days   Peripheral IV 12/30/22 20 G Anterior;Distal;Upper;Left Arm 12/30/22  0722  Arm  less than 1   Open Drain Right;Lateral Abdomen 06/07/16  1240  Abdomen  2397   NG/OG Vented/Dual Lumen 18 Fr. Right nare Marking at nare/corner of mouth 58 cm 12/30/22  0922  Right nare  less than 1   Ileostomy Continent (Abdominal pouch) RLQ 02/10/16  1059  RLQ  2515   Incision (Closed) 02/10/16 Abdomen Other (Comment) 02/10/16  1056  -- 2515   Incision (Closed) 02/12/16 Abdomen 02/12/16  1411  -- 2513   Incision (Closed) 06/07/16 Abdomen Other (Comment) 06/07/16  1241  -- 2397   Wound / Incision (Open or Dehisced) 02/15/16 Other (Comment) Abdomen Right;Lateral adhesive related skin injury from ostomy wafer 02/15/16  --  Abdomen  2510            Intake/Output Last 24 hours No intake or output data in the 24 hours ending 12/30/22 1610  Labs/Imaging Results for orders placed or performed during the hospital encounter of 12/30/22 (from the past 48 hour(s))  Lipase, blood     Status: None   Collection Time: 12/30/22  1:49 AM  Result Value Ref Range   Lipase 35 11 - 51 U/L    Comment: Performed at [facility], [address], [place], [place] [address]  Comprehensive metabolic panel     Status: Abnormal   Collection Time: 12/30/22  1:49 AM  Result Value Ref Range   Sodium 135 135 - 145 mmol/L   Potassium 3.4 (L) 3.5 - 5.1 mmol/L   Chloride 99 98 - 111 mmol/L   CO2 25 22 - 32 mmol/L   Glucose, Bld 107 (H) 70 - 99 mg/dL    Comment: Glucose reference range applies only to samples taken after fasting for at least 8 hours.   BUN 11 6 - 20  mg/dL   Creatinine, Ser 4.09 0.61 - 1.24 mg/dL   Calcium 9.0 8.9 - 81.1 mg/dL   Total Protein 7.5 6.5 - 8.1 g/dL   Albumin 3.9 3.5 - 5.0 g/dL   AST 22 15 - 41 U/L   ALT 17 0 - 44 U/L   Alkaline Phosphatase 86 38 - 126 U/L   Total Bilirubin 0.4 0.3 - 1.2 mg/dL   GFR, Estimated >91 >47 mL/min    Comment: (NOTE) Calculated using the CKD-EPI Creatinine Equation (2021)    Anion gap 11 5 - 15  Comment: Performed at [facility], [address], [place], [place] [address]  CBC     Status: Abnormal   Collection Time: 12/30/22  1:49 AM  Result Value Ref Range   WBC 16.1 (H) 4.0 - 10.5 K/uL   RBC 4.29 4.22 - 5.81 MIL/uL   Hemoglobin 12.5 (L) 13.0 - 17.0 g/dL   HCT 65.7 (L) 84.6 - 96.2 %   MCV 89.3 80.0 - 100.0 fL   MCH 29.1 26.0 - 34.0 pg   MCHC 32.6 30.0 - 36.0 g/dL   RDW 95.2 84.1 - 32.4 %   Platelets 272 150 - 400 K/uL   nRBC 0.0 0.0 - 0.2 %    Comment: Performed at [facility], [address] [place], [place] [address]   CT ABDOMEN PELVIS W CONTRAST  Result Date: 12/30/2022 CLINICAL DATA:  Abdominal pain, acute.  History of SBO. EXAM: CT ABDOMEN AND PELVIS WITH CONTRAST TECHNIQUE: Multidetector CT imaging of the abdomen and pelvis was performed using the standard protocol following bolus administration of intravenous contrast. RADIATION DOSE REDUCTION: This exam was performed according to the departmental dose-optimization program which includes automated exposure control, adjustment of the mA and/or kV according to patient size and/or use of iterative reconstruction technique. CONTRAST:  75mL OMNIPAQUE IOHEXOL 350 MG/ML SOLN COMPARISON:  03/31/2021 FINDINGS: Lower chest: Scarring is noted within the left lower lobe. No acute abnormality. 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 The largest is in segment 2/3 measuring 1.7 cm. These appear unchanged from previous exam. Gallbladder  appears normal. No bile duct dilatation. Pancreas: Unremarkable. No pancreatic ductal dilatation or surrounding inflammatory changes. Spleen: Normal in size without focal abnormality. Adrenals/Urinary Tract: Normal adrenal glands. Inferior pole right kidney stones measure up to 6 mm. Unchanged. No mass or hydronephrosis identified bilaterally. Urinary bladder appears within normal limits. Stomach/Bowel: Stomach appears normal. Proximal small bowel loops are unremarkable. Postoperative changes from right hemicolectomy with right upper quadrant enterocolonic anastomosis. There is abnormal dilatation of the mid and distal small bowel loops with transition point in the right upper quadrant of the abdomen where there is mildly thickened ileum just proximal to the enterocolonic anastomosis, image 43/3. Normal caliber of the colon. Vascular/Lymphatic: Normal appearance of the abdominal aorta. No enlarged abdominopelvic lymph nodes. Reproductive: Prostate is unremarkable. Other: Trace fluid is seen along the right paracolic gutter. No focal fluid collections. No signs of pneumoperitoneum. Musculoskeletal: No acute or significant osseous findings. Postoperative changes are identified within the lower thoracic and lumbar spine. No acute or suspicious osseous findings. IMPRESSION: 1. There is abnormal dilatation of the mid and distal small bowel loops with transition point in the right upper quadrant of the abdomen where there is mildly thickened ileum just proximal to the enterocolonic anastomosis. Findings are compatible with small bowel obstruction. 2. Trace fluid is seen along the right paracolic gutter. No focal fluid collections. No signs of pneumoperitoneum. 3. Nonobstructing right kidney stones. 4. 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 Electronically Signed   By: Signa Kell M.D.   On: 12/30/2022 08:10    Pending Labs Unresulted  Labs (From admission, onward)     Start     Ordered   12/30/22 0919  HIV Antibody (routine testing w rflx)  (HIV Antibody (Routine testing w reflex) panel)  Once,   R        12/30/22 7253  12/30/22 0140  Urinalysis, Routine w reflex microscopic -Urine, Clean Catch  Once,   URGENT       Question:  Specimen Source  Answer:  Urine, Clean Catch   12/30/22 0139            Vitals/Pain Today's Vitals   12/30/22 0139 12/30/22 0139 12/30/22 0537 12/30/22 0923  BP:  126/75 122/68 133/75  Pulse:  98 73 70  Resp:  18 18 18   Temp:  98.4 F (36.9 C) 98.4 F (36.9 C) 99.1 F (37.3 C)  TempSrc:   Oral Oral  SpO2:  100% 97% 97%  PainSc: 0-No pain       Isolation Precautions No active isolations  Medications Medications  heparin injection 5,000 Units (has no administration in time range)  morphine (PF) 4 MG/ML injection 4 mg (4 mg Intravenous Given 12/30/22 0722)  ondansetron (ZOFRAN) injection 4 mg (4 mg Intravenous Given 12/30/22 0722)  iohexol (OMNIPAQUE) 350 MG/ML injection 75 mL (75 mLs Intravenous Contrast Given 12/30/22 0754)  morphine (PF) 4 MG/ML injection 4 mg (4 mg Intravenous Given 12/30/22 0853)  sodium chloride 0.9 % bolus 1,000 mL (1,000 mLs Intravenous New Bag/Given 12/30/22 0853)    Mobility walks     Focused Assessments Abdominal   R Recommendations: See Admitting Provider Note  Report given to:   Additional Notes:

## 2022-12-30 NOTE — Plan of Care (Signed)
  Problem: Elimination: Goal: Will not experience complications related to urinary retention Outcome: Progressing   Problem: Pain Managment: Goal: General experience of comfort will improve Outcome: Not Progressing   Problem: Safety: Goal: Ability to remain free from injury will improve Outcome: Progressing   Problem: Skin Integrity: Goal: Risk for impaired skin integrity will decrease Outcome: Progressing

## 2022-12-30 NOTE — Progress Notes (Signed)
Gave gastrografin, notified radiology that medication has been given. Pt up in chair, ambulated to bathroom, pt tolerated fair, in pain but pt did receive pain med as well. Family at bedside, pt is clamped until 5pm, will resume to low intermittent suction. Call button and phone in reach. Plan of care ongoing.

## 2022-12-31 ENCOUNTER — Observation Stay (HOSPITAL_COMMUNITY): Payer: Medicare Other

## 2022-12-31 DIAGNOSIS — G4700 Insomnia, unspecified: Secondary | ICD-10-CM | POA: Diagnosis present

## 2022-12-31 DIAGNOSIS — K56609 Unspecified intestinal obstruction, unspecified as to partial versus complete obstruction: Secondary | ICD-10-CM | POA: Diagnosis present

## 2022-12-31 DIAGNOSIS — Z560 Unemployment, unspecified: Secondary | ICD-10-CM | POA: Diagnosis not present

## 2022-12-31 DIAGNOSIS — Z9049 Acquired absence of other specified parts of digestive tract: Secondary | ICD-10-CM | POA: Diagnosis not present

## 2022-12-31 DIAGNOSIS — E876 Hypokalemia: Secondary | ICD-10-CM | POA: Diagnosis present

## 2022-12-31 DIAGNOSIS — M21372 Foot drop, left foot: Secondary | ICD-10-CM | POA: Diagnosis present

## 2022-12-31 DIAGNOSIS — N319 Neuromuscular dysfunction of bladder, unspecified: Secondary | ICD-10-CM | POA: Diagnosis present

## 2022-12-31 DIAGNOSIS — Z91040 Latex allergy status: Secondary | ICD-10-CM | POA: Diagnosis not present

## 2022-12-31 DIAGNOSIS — E162 Hypoglycemia, unspecified: Secondary | ICD-10-CM | POA: Diagnosis not present

## 2022-12-31 DIAGNOSIS — R160 Hepatomegaly, not elsewhere classified: Secondary | ICD-10-CM | POA: Diagnosis present

## 2022-12-31 DIAGNOSIS — Z91048 Other nonmedicinal substance allergy status: Secondary | ICD-10-CM | POA: Diagnosis not present

## 2022-12-31 DIAGNOSIS — F410 Panic disorder [episodic paroxysmal anxiety] without agoraphobia: Secondary | ICD-10-CM | POA: Diagnosis present

## 2022-12-31 DIAGNOSIS — G8929 Other chronic pain: Secondary | ICD-10-CM | POA: Diagnosis present

## 2022-12-31 DIAGNOSIS — Z886 Allergy status to analgesic agent status: Secondary | ICD-10-CM | POA: Diagnosis not present

## 2022-12-31 DIAGNOSIS — Z881 Allergy status to other antibiotic agents status: Secondary | ICD-10-CM | POA: Diagnosis not present

## 2022-12-31 DIAGNOSIS — F17210 Nicotine dependence, cigarettes, uncomplicated: Secondary | ICD-10-CM | POA: Diagnosis present

## 2022-12-31 DIAGNOSIS — N200 Calculus of kidney: Secondary | ICD-10-CM | POA: Diagnosis present

## 2022-12-31 DIAGNOSIS — Z888 Allergy status to other drugs, medicaments and biological substances status: Secondary | ICD-10-CM | POA: Diagnosis not present

## 2022-12-31 DIAGNOSIS — Z885 Allergy status to narcotic agent status: Secondary | ICD-10-CM | POA: Diagnosis not present

## 2022-12-31 LAB — CBC WITH DIFFERENTIAL/PLATELET
Abs Immature Granulocytes: 0.04 10*3/uL (ref 0.00–0.07)
Basophils Absolute: 0.1 10*3/uL (ref 0.0–0.1)
Basophils Relative: 0 %
Eosinophils Absolute: 0.1 10*3/uL (ref 0.0–0.5)
Eosinophils Relative: 1 %
HCT: 39.1 % (ref 39.0–52.0)
Hemoglobin: 12.7 g/dL — ABNORMAL LOW (ref 13.0–17.0)
Immature Granulocytes: 0 %
Lymphocytes Relative: 17 %
Lymphs Abs: 2.1 10*3/uL (ref 0.7–4.0)
MCH: 28.4 pg (ref 26.0–34.0)
MCHC: 32.5 g/dL (ref 30.0–36.0)
MCV: 87.5 fL (ref 80.0–100.0)
Monocytes Absolute: 1.3 10*3/uL — ABNORMAL HIGH (ref 0.1–1.0)
Monocytes Relative: 11 %
Neutro Abs: 8.6 10*3/uL — ABNORMAL HIGH (ref 1.7–7.7)
Neutrophils Relative %: 71 %
Platelets: 271 10*3/uL (ref 150–400)
RBC: 4.47 MIL/uL (ref 4.22–5.81)
RDW: 13.8 % (ref 11.5–15.5)
WBC: 12.2 10*3/uL — ABNORMAL HIGH (ref 4.0–10.5)
nRBC: 0 % (ref 0.0–0.2)

## 2022-12-31 LAB — GLUCOSE, CAPILLARY
Glucose-Capillary: 82 mg/dL (ref 70–99)
Glucose-Capillary: 82 mg/dL (ref 70–99)
Glucose-Capillary: 75 mg/dL (ref 70–99)

## 2022-12-31 LAB — BASIC METABOLIC PANEL
Anion gap: 11 (ref 5–15)
BUN: 15 mg/dL (ref 6–20)
CO2: 24 mmol/L (ref 22–32)
Calcium: 8.7 mg/dL — ABNORMAL LOW (ref 8.9–10.3)
Chloride: 101 mmol/L (ref 98–111)
Creatinine, Ser: 0.9 mg/dL (ref 0.61–1.24)
GFR, Estimated: >60 mL/min (ref >60)
Glucose, Bld: 87 mg/dL (ref 70–99)
Potassium: 4 mmol/L (ref 3.5–5.1)
Sodium: 136 mmol/L (ref 135–145)

## 2022-12-31 LAB — MAGNESIUM: Magnesium: 1.9 mg/dL (ref 1.7–2.4)

## 2022-12-31 NOTE — Progress Notes (Signed)
HD#0 SUBJECTIVE:  Patient Summary: [name] is a 57 y.o. with a pertinent PMH of bowel perforation in 2018 requiring 3 surgeries and 2 subsequent SBOs who presented with abdominal pain and admitted for small bowel obstruction.   Overnight Events: none  Interim History: Feeling a bit better with pain. Difficulty ambulating w/ NGT. No gas, no BM. No vomiting.    OBJECTIVE:  Vital Signs: Vitals:   12/30/22 1936 12/31/22 0001 12/31/22 0445 12/31/22 0713  BP: 121/74 127/61 112/74 122/69  Pulse: 61 (!) 58 64 69  Resp: 18 18 18 16   Temp: 98.5 F (36.9 C) 99.3 F (37.4 C) 99 F (37.2 C) 98.2 F (36.8 C)  TempSrc:    Oral  SpO2: 92% 99% 97% 98%  Weight:      Height:       Supplemental O2: Room Air SpO2: 98 %  Filed Weights   12/30/22 1044 12/30/22 1200  Weight: 76.8 kg 76.8 kg     Intake/Output Summary (Last 24 hours) at 12/31/2022 0731 Last data filed at 12/31/2022 0645 Gross per 24 hour  Intake 2279.12 ml  Output 1650 ml  Net 629.12 ml   Net IO Since Admission: 629.12 mL [12/31/22 0731]  Physical Exam: Physical Exam Constitutional:      General: He is not in acute distress.    Appearance: He is well-developed. He is not ill-appearing.  Cardiovascular:     Rate and Rhythm: Normal rate and regular rhythm.     Heart sounds: Normal heart sounds.  Pulmonary:     Effort: Pulmonary effort is normal.     Breath sounds: Normal breath sounds.  Abdominal:     General: Abdomen is flat. Bowel sounds are decreased. There is no distension.     Palpations: Abdomen is rigid.     Tenderness: There is generalized abdominal tenderness. There is no guarding or rebound.  Skin:    General: Skin is warm and dry.     Capillary Refill: Capillary refill takes less than 2 seconds.  Neurological:     General: No focal deficit present.     Mental Status: He is alert and oriented to person, place, and time.  Psychiatric:        Mood and Affect: Mood normal.        Behavior:  Behavior normal.     Patient Lines/Drains/Airways Status     Active Line/Drains/Airways     Name Placement date Placement time Site Days   Peripheral IV 12/30/22 20 G Anterior;Distal;Upper;Left Arm 12/30/22  0722  Arm  1   Peripheral IV 12/30/22 20 G Anterior;Left Forearm 12/30/22  1845  Forearm  1   Open Drain Right;Lateral Abdomen 06/07/16  1240  Abdomen  2398   NG/OG Vented/Dual Lumen 18 Fr. Right nare Marking at nare/corner of mouth 58 cm 12/30/22  0922  Right nare  1   Ileostomy Continent (Abdominal pouch) RLQ 02/10/16  1059  RLQ  2516   Incision (Closed) 02/10/16 Abdomen Other (Comment) 02/10/16  1056  -- 2516   Incision (Closed) 02/12/16 Abdomen 02/12/16  1411  -- 2514   Incision (Closed) 06/07/16 Abdomen Other (Comment) 06/07/16  1241  -- 2398   Wound / Incision (Open or Dehisced) 02/15/16 Other (Comment) Abdomen Right;Lateral adhesive related skin injury from ostomy wafer 02/15/16  --  Abdomen  2511             ASSESSMENT/PLAN:  Assessment: Principal Problem:   Small bowel obstruction (HCC)  [name] Schwartz is a 57 y.o. with a pertinent PMH of bowel perforation in 2018 requiring 3 surgeries and 2 subsequent SBOs who presented with abdominal pain and admitted for small bowel obstruction.   Plan: Small Bowel Obstruction CT abdomen pelvis is consistent with SBO: dilation of the mid and distal small bowel with transition point in the ileum just proximal to the enterocolonic anastomosis.  Diffuse pain on exam but no rebound or distension, somewhat improved from yesterday.  There is leukocytosis that is improving, likely reactive in an afebrile patient without other signs of infection - no current indication for ABX. - Gastrografin with persistent SBO and no contrast in colon with rec 24 hour follow up, repeat imaging per surgery - NPO - IV fluids: LR 146mL/hr - NG tube to suction - IV tylenol and morphine q4h prn for pain control - Gen Surg on board, thank you    Chronic Pain Pt is on Endocet 10-325 five times daily with outpatient management. We are treating pain as above.   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   Insomnia - melatonin 3 mg at night prn  Best Practice: Diet: NPO IVF: Fluids: LR, Rate:  152mL/hr VTE: heparin injection 5,000 Units Start: 12/30/22 0930 Code: Full AB: none DISPO: Anticipated discharge in 2 days to Home pending  ability to advance diet .  Signature: Katheran James, D.O.  Internal Medicine Resident, PGY-1 [facility]  Pager: [phone] 7:31 AM, 12/31/2022   Please contact the on call pager after 5 pm and on weekends at [phone].

## 2022-12-31 NOTE — Progress Notes (Signed)
Subjective: Still with some intermittently abdominal pain.  No bowel function yet, but feels things are rumbling in his belly.  ROS: See above, otherwise other systems negative  Objective: Vital signs in last 24 hours: Temp:  [98.1 F (36.7 C)-99.3 F (37.4 C)] 98.2 F (36.8 C) (09/16 0713) Pulse Rate:  [58-71] 69 (09/16 0713) Resp:  [16-18] 16 (09/16 0713) BP: (112-133)/(59-75) 122/69 (09/16 0713) SpO2:  [92 %-99 %] 98 % (09/16 0713) Weight:  [76.8 kg] 76.8 kg (09/15 1200) Last BM Date : 12/30/22  Intake/Output from previous day: 09/15 0701 - 09/16 0700 In: 2279.1 [I.V.:1486.8; NG/GT:110; IV Piggyback:682.3] Out: 1650 [Emesis/NG output:1650] Intake/Output this shift: No intake/output data recorded.  PE: Abd: soft, mildly tender diffusely, NGT with some bilious output, ND  Lab Results:  Recent Labs    12/30/22 0149 12/31/22 0217  WBC 16.1* 12.2*  HGB 12.5* 12.7*  HCT 38.3* 39.1  PLT 272 271   BMET Recent Labs    12/30/22 0149 12/31/22 0217  NA 135 136  K 3.4* 4.0  CL 99 101  CO2 25 24  GLUCOSE 107* 87  BUN 11 15  CREATININE 0.81 0.90  CALCIUM 9.0 8.7*   PT/INR No results for input(s): "LABPROT", "INR" in the last 72 hours. CMP     Component Value Date/Time   NA 136 12/31/2022 0217   NA 143 11/07/2016 1340   K 4.0 12/31/2022 0217   K 4.1 11/07/2016 1340   CL 101 12/31/2022 0217   CO2 24 12/31/2022 0217   CO2 30 (H) 11/07/2016 1340   GLUCOSE 87 12/31/2022 0217   GLUCOSE 87 11/07/2016 1340   BUN 15 12/31/2022 0217   BUN 3.2 (L) 11/07/2016 1340   CREATININE 0.90 12/31/2022 0217   CREATININE 1.0 11/07/2016 1340   CALCIUM 8.7 (L) 12/31/2022 0217   CALCIUM 10.1 11/07/2016 1340   PROT 7.5 12/30/2022 0149   PROT 7.9 11/07/2016 1340   ALBUMIN 3.9 12/30/2022 0149   ALBUMIN 4.5 11/07/2016 1340   AST 22 12/30/2022 0149   AST 31 11/07/2016 1340   ALT 17 12/30/2022 0149   ALT 30 11/07/2016 1340   ALKPHOS 86 12/30/2022 0149   ALKPHOS 98  11/07/2016 1340   BILITOT 0.4 12/30/2022 0149   BILITOT 0.45 11/07/2016 1340   GFRNONAA >60 12/31/2022 0217   GFRAA >60 06/12/2016 0544   Lipase     Component Value Date/Time   LIPASE 35 12/30/2022 0149       Studies/Results: DG Abd Portable 1V-Small Bowel Obstruction Protocol-initial, 8 hr delay  Result Date: 12/31/2022 CLINICAL DATA:  Abdominal pain, 8 hour follow-up exam EXAM: PORTABLE ABDOMEN - 1 VIEW COMPARISON:  Gastric catheter is again noted within the stomach. FINDINGS: Scattered large and small bowel gas is noted. Multiple dilated loops of small bowel are again identified and stable. Administered contrast lies within dilated small bowel loops. No colonic contrast is seen. Postsurgical changes in the lumbar spine are noted. No free air is seen. IMPRESSION: Persistent small bowel dilatation. No colonic contrast is seen. Recommend 24 hour follow-up film Electronically Signed   By: Alcide Clever M.D.   On: 12/31/2022 00:23   DG Abd 1 View  Result Date: 12/30/2022 CLINICAL DATA:  Evaluate nasogastric tube placement. EXAM: ABDOMEN - 1 VIEW COMPARISON:  CT from earlier today FINDINGS: Enteric tube tip and side port are below the level of the GE junction in the expected location of the gastric fundus. Visualized portions of  the bowel gas pattern appear unchanged from CT performed earlier today. Postoperative change within the lower thoracic spine. IMPRESSION: Enteric tube tip and side port are below the level of the GE junction in the expected location of the gastric fundus. Electronically Signed   By: Signa Kell M.D.   On: 12/30/2022 09:41   CT ABDOMEN PELVIS W CONTRAST  Result Date: 12/30/2022 CLINICAL DATA:  Abdominal pain, acute.  History of SBO. EXAM: CT ABDOMEN AND PELVIS WITH CONTRAST TECHNIQUE: Multidetector CT imaging of the abdomen and pelvis was performed using the standard protocol following bolus administration of intravenous contrast. RADIATION DOSE REDUCTION: This exam  was performed according to the departmental dose-optimization program which includes automated exposure control, adjustment of the mA and/or kV according to patient size and/or use of iterative reconstruction technique. CONTRAST:  75mL OMNIPAQUE IOHEXOL 350 MG/ML SOLN COMPARISON:  03/31/2021 FINDINGS: Lower chest: Scarring is noted within the left lower lobe. No acute abnormality. 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 The largest is in segment 2/3 measuring 1.7 cm. These appear unchanged from previous exam. Gallbladder appears normal. No bile duct dilatation. Pancreas: Unremarkable. No pancreatic ductal dilatation or surrounding inflammatory changes. Spleen: Normal in size without focal abnormality. Adrenals/Urinary Tract: Normal adrenal glands. Inferior pole right kidney stones measure up to 6 mm. Unchanged. No mass or hydronephrosis identified bilaterally. Urinary bladder appears within normal limits. Stomach/Bowel: Stomach appears normal. Proximal small bowel loops are unremarkable. Postoperative changes from right hemicolectomy with right upper quadrant enterocolonic anastomosis. There is abnormal dilatation of the mid and distal small bowel loops with transition point in the right upper quadrant of the abdomen where there is mildly thickened ileum just proximal to the enterocolonic anastomosis, image 43/3. Normal caliber of the colon. Vascular/Lymphatic: Normal appearance of the abdominal aorta. No enlarged abdominopelvic lymph nodes. Reproductive: Prostate is unremarkable. Other: Trace fluid is seen along the right paracolic gutter. No focal fluid collections. No signs of pneumoperitoneum. Musculoskeletal: No acute or significant osseous findings. Postoperative changes are identified within the lower thoracic and lumbar spine. No acute or suspicious osseous findings. IMPRESSION: 1. There is abnormal dilatation of the  mid and distal small bowel loops with transition point in the right upper quadrant of the abdomen where there is mildly thickened ileum just proximal to the enterocolonic anastomosis. Findings are compatible with small bowel obstruction. 2. Trace fluid is seen along the right paracolic gutter. No focal fluid collections. No signs of pneumoperitoneum. 3. Nonobstructing right kidney stones. 4. 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 Electronically Signed   By: Signa Kell M.D.   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 In 2018 he underwent ileostomy takedown. Last SBO 2022 - 8-hr delay film reviewed and no contrast in the colon noted yet.  Persistent SBO -repeat film this morning -no bowel function yet, continue NGT today -mobilize -WBC improved and down to 12 from 16K  ID - none VTE - SCDs, sq heparin FEN - IVF, NPO/NGT to LIWS. K 4 today  I reviewed hospitalist notes, last 24 h vitals and pain scores, last 48 h intake and output, last 24 h labs and trends, and last 24 h imaging results.  LOS: 0 days    Letha Cape , [facility] Surgery 12/31/2022, 9:12 AM Please see Amion for pager number during day hours 7:00am-4:30pm or 7:00am -11:30am on weekends

## 2022-12-31 NOTE — Plan of Care (Signed)

## 2023-01-01 DIAGNOSIS — K56609 Unspecified intestinal obstruction, unspecified as to partial versus complete obstruction: Secondary | ICD-10-CM | POA: Diagnosis not present

## 2023-01-01 LAB — BASIC METABOLIC PANEL
Anion gap: 12 (ref 5–15)
BUN: 18 mg/dL (ref 6–20)
CO2: 25 mmol/L (ref 22–32)
Calcium: 8.2 mg/dL — ABNORMAL LOW (ref 8.9–10.3)
Chloride: 99 mmol/L (ref 98–111)
Creatinine, Ser: 0.94 mg/dL (ref 0.61–1.24)
GFR, Estimated: >60 mL/min (ref >60)
Glucose, Bld: 64 mg/dL — ABNORMAL LOW (ref 70–99)
Potassium: 3.5 mmol/L (ref 3.5–5.1)
Sodium: 136 mmol/L (ref 135–145)

## 2023-01-01 LAB — CBC
HCT: 35.2 % — ABNORMAL LOW (ref 39.0–52.0)
Hemoglobin: 11.7 g/dL — ABNORMAL LOW (ref 13.0–17.0)
MCH: 29.3 pg (ref 26.0–34.0)
MCHC: 33.2 g/dL (ref 30.0–36.0)
MCV: 88 fL (ref 80.0–100.0)
Platelets: 224 10*3/uL (ref 150–400)
RBC: 4 MIL/uL — ABNORMAL LOW (ref 4.22–5.81)
RDW: 13.7 % (ref 11.5–15.5)
WBC: 9.5 10*3/uL (ref 4.0–10.5)
nRBC: 0 % (ref 0.0–0.2)

## 2023-01-01 MED ORDER — DEXTROSE IN LACTATED RINGERS 5 % IV SOLN: INTRAVENOUS | Status: AC

## 2023-01-01 MED ORDER — ACETAMINOPHEN 10 MG/ML IV SOLN
1000.0000 mg | Freq: Four times a day (QID) | INTRAVENOUS | Status: AC
  Administered 2023-01-01 – 2023-01-02 (×4): 1000 mg via INTRAVENOUS
  Filled 2023-01-01 (×4): qty 100

## 2023-01-01 NOTE — Progress Notes (Signed)
NGT removed per [facility] Order patient tolerated well.

## 2023-01-01 NOTE — Progress Notes (Addendum)
HD#1 SUBJECTIVE:  Patient Summary: [name] is a 57 y.o. with a pertinent PMH of bowel perforation in 2018 requiring 3 surgeries and 2 subsequent SBOs who presented with abdominal pain and admitted for small bowel obstruction.   Overnight Events: None    Interim History: Abdominal pain is improving but not resolved. He is very thirsty but receptive to continued NPO. He had flatus and a small bowel movement yesterday. No N/V.   OBJECTIVE:  Vital Signs: Vitals:   12/31/22 1539 12/31/22 2031 01/01/23 0532 01/01/23 0822  BP: (!) 119/94 131/67 107/64 115/67  Pulse: 74 64 72 66  Resp: 16 18 19 17   Temp: 98 F (36.7 C) 98.3 F (36.8 C) 98.1 F (36.7 C) 98.3 F (36.8 C)  TempSrc: Oral Oral Oral Oral  SpO2: 97% 100% 94% 97%  Weight:   75.1 kg   Height:   5\' 11"  (1.803 m)    Supplemental O2: Room Air SpO2: 97 %  Filed Weights   12/30/22 1044 12/30/22 1200 01/01/23 0532  Weight: 76.8 kg 76.8 kg 75.1 kg     Intake/Output Summary (Last 24 hours) at 01/01/2023 1033 Last data filed at 01/01/2023 0900 Gross per 24 hour  Intake 60 ml  Output 2200 ml  Net -2140 ml   Net IO Since Admission: -1,510.88 mL [01/01/23 1033]  Physical Exam: Physical Exam Constitutional:      General: He is not in acute distress.    Appearance: He is well-developed. He is not ill-appearing.  Cardiovascular:     Rate and Rhythm: Normal rate and regular rhythm.     Heart sounds: Normal heart sounds.  Pulmonary:     Effort: Pulmonary effort is normal.     Breath sounds: Normal breath sounds.  Abdominal:     General: Abdomen is flat. Bowel sounds are normal. There is no distension.     Palpations: Abdomen is soft.     Tenderness: There is abdominal tenderness. There is no guarding or rebound.  Skin:    General: Skin is warm and dry.  Neurological:     General: No focal deficit present.     Mental Status: He is alert and oriented to person, place, and time.  Psychiatric:        Mood and  Affect: Mood normal.        Behavior: Behavior normal.     Patient Lines/Drains/Airways Status     Active Line/Drains/Airways     Name Placement date Placement time Site Days   Peripheral IV 12/30/22 20 G Anterior;Distal;Upper;Left Arm 12/30/22  0722  Arm  2   Peripheral IV 12/30/22 20 G Anterior;Left Forearm 12/30/22  1845  Forearm  2   Open Drain Right;Lateral Abdomen 06/07/16  1240  Abdomen  2399   NG/OG Vented/Dual Lumen 18 Fr. Right nare Marking at nare/corner of mouth 58 cm 12/30/22  0922  Right nare  2   Ileostomy Continent (Abdominal pouch) RLQ 02/10/16  1059  RLQ  2517   Incision (Closed) 02/10/16 Abdomen Other (Comment) 02/10/16  1056  -- 2517   Incision (Closed) 02/12/16 Abdomen 02/12/16  1411  -- 2515   Incision (Closed) 06/07/16 Abdomen Other (Comment) 06/07/16  1241  -- 2399   Wound / Incision (Open or Dehisced) 02/15/16 Other (Comment) Abdomen Right;Lateral adhesive related skin injury from ostomy wafer 02/15/16  --  Abdomen  2512             ASSESSMENT/PLAN:  Assessment: Principal Problem:  Small bowel obstruction (HCC) Active Problems:   SBO (small bowel obstruction) (HCC)  [name] is a 57 y.o. with a pertinent PMH of bowel perforation in 2018 requiring 3 surgeries and 2 subsequent SBOs who presented with abdominal pain and admitted for small bowel obstruction.   Plan: Small Bowel Obstruction SBO persists but with positive changes including production of flatus, a small bowel movement, improved pain. He is utilizing his prn morphine to satisfaction. On exam he has mild abdominal pain but no rebound or distension. Leukocytosis resolved. Previous studies: CT abdomen pelvis with dilation of the mid and distal small bowel with transition point in the ileum just proximal to the enterocolonic anastomosis. Gastrografin with persistent SBO and no contrast in colon with follow up film that was stable - Diet to advance to clears per surgery - IV fluids: D5-LR  167mL/hr per hypoglycemia - NG tube to suction - IV tylenol and morphine q4h prn for pain control - Gen Surg on board, thank you   Hypoglycemia Glucose 64 on morning labs in NPO patient. He is Aox4 and communicating well. - start D5-LR per above  Chronic Pain Pt is on Endocet 10-325 five times daily with outpatient management. We are treating pain as above.   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   Insomnia - melatonin 3 mg at night prn  Best Practice: Diet: NPO IVF: Fluids: D5-LR, Rate:  130mL/hr VTE: heparin injection 5,000 Units Start: 12/30/22 0930 Code: Full AB: none DISPO: Anticipated discharge in 2 days to Home pending  SBO resolution .  Signature: Katheran James, D.O.  Internal Medicine Resident, PGY-1 [facility]  Pager: [phone] 10:33 AM, 01/01/2023   Please contact the on call pager after 5 pm and on weekends at [phone].

## 2023-01-01 NOTE — Plan of Care (Signed)
  Problem: Nutrition: Goal: Adequate nutrition will be maintained Outcome: Progressing   Problem: Pain Managment: Goal: General experience of comfort will improve Outcome: Progressing   Problem: Safety: Goal: Ability to remain free from injury will improve Outcome: Progressing   

## 2023-01-01 NOTE — Progress Notes (Signed)
Subjective: Pain is overall improving.  Had 2BMs.  Drinking a lot of ice.  ROS: See above, otherwise other systems negative  Objective: Vital signs in last 24 hours: Temp:  [98 F (36.7 C)-98.3 F (36.8 C)] 98.3 F (36.8 C) (09/17 0822) Pulse Rate:  [64-74] 66 (09/17 0822) Resp:  [16-19] 17 (09/17 0822) BP: (107-131)/(64-94) 115/67 (09/17 0822) SpO2:  [94 %-100 %] 97 % (09/17 0822) Weight:  [75.1 kg] 75.1 kg (09/17 0532) Last BM Date : 12/31/22  Intake/Output from previous day: 09/16 0701 - 09/17 0700 In: 60 [NG/GT:60] Out: 1450 [Emesis/NG output:1450] Intake/Output this shift: Total I/O In: 0  Out: 750 [Emesis/NG output:750]  PE: Abd: soft, diffuse tenderness much improved today, NGT with more watered down appearing output, ND  Lab Results:  Recent Labs    12/31/22 0217 01/01/23 0354  WBC 12.2* 9.5  HGB 12.7* 11.7*  HCT 39.1 35.2*  PLT 271 224   BMET Recent Labs    12/31/22 0217 01/01/23 0354  NA 136 136  K 4.0 3.5  CL 101 99  CO2 24 25  GLUCOSE 87 64*  BUN 15 18  CREATININE 0.90 0.94  CALCIUM 8.7* 8.2*   PT/INR No results for input(s): "LABPROT", "INR" in the last 72 hours. CMP     Component Value Date/Time   NA 136 01/01/2023 0354   NA 143 11/07/2016 1340   K 3.5 01/01/2023 0354   K 4.1 11/07/2016 1340   CL 99 01/01/2023 0354   CO2 25 01/01/2023 0354   CO2 30 (H) 11/07/2016 1340   GLUCOSE 64 (L) 01/01/2023 0354   GLUCOSE 87 11/07/2016 1340   BUN 18 01/01/2023 0354   BUN 3.2 (L) 11/07/2016 1340   CREATININE 0.94 01/01/2023 0354   CREATININE 1.0 11/07/2016 1340   CALCIUM 8.2 (L) 01/01/2023 0354   CALCIUM 10.1 11/07/2016 1340   PROT 7.5 12/30/2022 0149   PROT 7.9 11/07/2016 1340   ALBUMIN 3.9 12/30/2022 0149   ALBUMIN 4.5 11/07/2016 1340   AST 22 12/30/2022 0149   AST 31 11/07/2016 1340   ALT 17 12/30/2022 0149   ALT 30 11/07/2016 1340   ALKPHOS 86 12/30/2022 0149   ALKPHOS 98 11/07/2016 1340   BILITOT 0.4 12/30/2022 0149    BILITOT 0.45 11/07/2016 1340   GFRNONAA >60 01/01/2023 0354   GFRAA >60 06/12/2016 0544   Lipase     Component Value Date/Time   LIPASE 35 12/30/2022 0149       Studies/Results: DG Abd Portable 1V  Result Date: 12/31/2022 CLINICAL DATA:  Small-bowel obstruction. EXAM: PORTABLE ABDOMEN - 1 VIEW COMPARISON:  X-ray 12/31/2022 12:15 a.m. FINDINGS: Enteric tube overlying the stomach. Once again there is moderate diffuse colonic stool. Several dilated loops of small bowel in the midabdomen again identified consistent with known history of obstruction. No obvious free air on these portable supine radiographs. IMPRESSION: Continued dilatation of small bowel in the midabdomen with diffuse colonic stool. Electronically Signed   By: Karen Kays M.D.   On: 12/31/2022 10:55   DG Abd Portable 1V-Small Bowel Obstruction Protocol-initial, 8 hr delay  Result Date: 12/31/2022 CLINICAL DATA:  Abdominal pain, 8 hour follow-up exam EXAM: PORTABLE ABDOMEN - 1 VIEW COMPARISON:  Gastric catheter is again noted within the stomach. FINDINGS: Scattered large and small bowel gas is noted. Multiple dilated loops of small bowel are again identified and stable. Administered contrast lies within dilated small bowel loops. No colonic contrast is seen. Postsurgical  changes in the lumbar spine are noted. No free air is seen. IMPRESSION: Persistent small bowel dilatation. No colonic contrast is seen. Recommend 24 hour follow-up film Electronically Signed   By: Alcide Clever M.D.   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 In 2018 he underwent ileostomy takedown. Last SBO 2022 - repeat film yesterday looked better and has since starting having more bowel function with 2 BMs and less NGT output  (most is ice.  Has 4 cups at bedside) -mobilize [facility] today -clamp NGT and give CLD.  If tolerates will DC NGT later today  ID - none VTE - SCDs, sq heparin FEN - IVF, NGT clamped/CLD  I reviewed hospitalist notes, last 24 h vitals and pain scores, last 48 h intake and output, last 24 h labs and trends, and last 24 h imaging results.   LOS: 1 day    Letha Cape , [facility] 01/01/2023, 10:49 AM Please see Amion for pager number during day hours 7:00am-4:30pm or 7:00am -11:30am on weekends

## 2023-01-01 NOTE — Progress Notes (Signed)
   01/01/23 1259  Mobility  Activity Refused mobility  Mobility Specialist Start Time (ACUTE ONLY) 1245  Mobility Specialist Stop Time (ACUTE ONLY) 1250  Mobility Specialist Time Calculation (min) (ACUTE ONLY) 5 min   Mobility Specialist: Progress Note  Pt refused mobility session, stated he "walks around fine." - Received and left in bed with all needs met. Call bell within reach.   Barnie Mort, BS Mobility Specialist Please contact via SecureChat or Rehab office at [phone].

## 2023-01-02 DIAGNOSIS — K56609 Unspecified intestinal obstruction, unspecified as to partial versus complete obstruction: Secondary | ICD-10-CM | POA: Diagnosis not present

## 2023-01-02 LAB — MAGNESIUM: Magnesium: 1.8 mg/dL (ref 1.7–2.4)

## 2023-01-02 LAB — POTASSIUM: Potassium: 3.7 mmol/L (ref 3.5–5.1)

## 2023-01-02 MED ORDER — POTASSIUM CHLORIDE CRYS ER 20 MEQ PO TBCR
40.0000 meq | EXTENDED_RELEASE_TABLET | ORAL | Status: AC
  Administered 2023-01-02 (×2): 40 meq via ORAL
  Filled 2023-01-02 (×2): qty 2

## 2023-01-02 MED ORDER — ACETAMINOPHEN 500 MG PO TABS: 1000.0000 mg | ORAL_TABLET | Freq: Four times a day (QID) | ORAL | Status: DC | PRN

## 2023-01-02 MED ORDER — HYDROMORPHONE HCL 2 MG PO TABS: 1.0000 mg | ORAL_TABLET | Freq: Four times a day (QID) | ORAL | Status: DC | PRN

## 2023-01-02 MED ORDER — OXYCODONE HCL 5 MG PO TABS: 5.0000 mg | ORAL_TABLET | Freq: Three times a day (TID) | ORAL | Status: DC | PRN

## 2023-01-02 MED ORDER — MAGNESIUM SULFATE 2 GM/50ML IV SOLN: 2.0000 g | Freq: Once | INTRAVENOUS | Status: DC

## 2023-01-02 MED ORDER — OXYCODONE HCL 5 MG PO TABS
5.0000 mg | ORAL_TABLET | Freq: Three times a day (TID) | ORAL | Status: DC | PRN
  Administered 2023-01-02: 5 mg via ORAL
  Filled 2023-01-02: qty 1

## 2023-01-02 NOTE — Progress Notes (Signed)
[place] to be D/C'd Home per MD order.  Discussed with the patient and all questions fully answered.  IV catheters discontinued intact by previous RN.   An After Visit Summary was printed and given to the patient.  D/c education completed with patient/family including follow up instructions, medication list, d/c activities limitations if indicated, with other d/c instructions as indicated by MD - patient able to verbalize understanding, all questions fully answered.   Patient instructed to return to ED, call 911, or call MD for any changes in condition.   Patient escorted to main entrance and D/C home via private auto.  Pauletta Browns 01/02/2023 6:10 PM

## 2023-01-02 NOTE — Discharge Summary (Signed)
Name: [name] MRN: [id number] DOB: Jan 22, 1966 57 y.o. PCP: [facility]  Date of Admission: 12/30/2022  1:35 AM Date of Discharge:  01/02/2023 Attending Physician: Dr.  Lafonda Mosses  DISCHARGE DIAGNOSIS:  Primary Problem: Small bowel obstruction [facility])   Hospital Problems: Principal Problem:   Small bowel obstruction (HCC) Active Problems:   SBO (small bowel obstruction) (HCC)    DISCHARGE MEDICATIONS:   Allergies as of 01/02/2023       Reactions   Celecoxib Other (See Comments)   IRRITABILITY Other reaction(s): IRRITABILITY   Clonazepam Itching   Cyclobenzaprine Other (See Comments)   IRRITABILITY Other reaction(s): IRRITABILITY   Dexamethasone Other (See Comments)   IRRITABILITY Other reaction(s): IRRITABILITY   Diazepam Other (See Comments)   IRRITABILITY Other reaction(s): IRRITABILITY   Etodolac Other (See Comments)   IRRITABILITY Other reaction(s): IRRITABILITY   Gabapentin Other (See Comments)   IRRITABILITY Other reaction(s): IRRITABILITY   Latex Itching, Rash   Metaxalone Other (See Comments)   IRRITABILITY (Skelaxin) Other reaction(s): IRRITABILITY   Methadone Other (See Comments)   IRRITABILITY  Other reaction(s): IRRITABILITY   Methocarbamol Other (See Comments)   IRRITABILITY Other reaction(s): IRRITABILITY   Other Other (See Comments), Rash   PLASTIC TAPE TAKES OFF 1-2 LAYERS OF [name] SKIN   Pregabalin Other (See Comments)   Aggression and irritability Other reaction(s): IRRITABILITY   Propoxyphene Other (See Comments)   IRRITABILITY Other reaction(s): IRRITABILITY   Tape Other (See Comments), Rash   PLASTIC TAPE TAKES OFF 1-2 LAYERS OF [name] SKIN   Tramadol Other (See Comments)   IRRITABILITY Other reaction(s): IRRITABILITY   Wound Dressing Adhesive Other (See Comments), Rash   PLASTIC TAPE TAKES OFF 1-2 LAYERS OF [name] SKIN, also   Aspirin Other (See Comments)   Cannot take this again- affects  bowels- Tylenol ONLY   Oxycodone Hcl Other (See Comments)   Made the patient feel "uncomfortable"   Motrin [ibuprofen] Other (See Comments)   Cannot take this again- affects bowels- Tylenol ONLY   Oxycodone Other (See Comments)   Made the patient feel "uncomfortable"        Medication List     TAKE these medications    acetaminophen 500 MG tablet Commonly known as: TYLENOL Take 500-1,000 mg by mouth every 6 (six) hours as needed for mild pain.   melatonin 5 MG Tabs Take 1 tablet (5 mg total) by mouth at bedtime as needed (for sleep).        DISPOSITION AND FOLLOW-UP:  [name] was discharged from [facility] in stable condition. At the hospital follow up visit please address:  Follow-up Recommendations: Please evaluate for stomach pain, diet tolerance, and nutrition. This patient has chronic pain and diarrhea. Please consider referrals to pain management and gastroenterology at provider's discretion.   HOSPITAL COURSE:  Patient Summary: Small Bowel Obstruction [name] presented to [facility] ED on 9/15 complaining of abdominal pain. SBO was confirmed on CT scan. This was his third hospitalization for this issue since 2018 when he experienced bowel perforation. He was admitted to the medicine service and surgery was immediately consulted. Initial gastrografin study was performed confirming persistent SBO. He was treated conservatively with NG tube decompression and IV fluids. On the second day his diet was advanced to clears. On the third day his diet was advance to normal. He tolerated this well. He experienced bowel movements. Throughout, he utilized morphine for pain to satisfaction. He was agreeable to discharge on his  third day.  Hypokalemia On his third morning his potassium was 2.9. Mg was normal. He had been NPO. This was replaced before discharge with improvement to 3.7.  Hypoglycemia On his second morning, blood sugar was low at 64 on morning  labs in NPO patient. He was asymptomatic. He was given D5-LR with resolution.    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   DISCHARGE INSTRUCTIONS:   Discharge Instructions     Call MD for:  persistant nausea and vomiting   Complete by: As directed    Call MD for:  severe uncontrolled pain   Complete by: As directed    Call MD for:  temperature >100.4   Complete by: As directed    Diet - low sodium heart healthy   Complete by: As directed    Increase activity slowly   Complete by: As directed    Increase activity slowly   Complete by: As directed        SUBJECTIVE:  Pt feels better than his day of presentation. He ate a lunch of solid food and tolerated it well. He still has his chronic abdominal pain and watery stools, but this is his baseline. He feels his pain is manageable. He is prepared to go home. Discharge Vitals:   BP 108/60 (BP Location: Left Arm)   Pulse 61   Temp 98.3 F (36.8 C) (Oral)   Resp 17   Ht 5\' 11"  (1.803 m)   Wt 75.2 kg   SpO2 100%   BMI 23.12 kg/m   OBJECTIVE:  Physical Exam Constitutional:      General: He is not in acute distress.    Appearance: He is well-developed. He is not ill-appearing.  Cardiovascular:     Rate and Rhythm: Normal rate and regular rhythm.     Heart sounds: Normal heart sounds.  Pulmonary:     Effort: Pulmonary effort is normal.     Breath sounds: Normal breath sounds.  Abdominal:     General: Abdomen is flat. Bowel sounds are normal.     Palpations: Abdomen is soft.  Genitourinary:    Testes:        Right: Swelling not present.        Left: Swelling not present.  Skin:    General: Skin is warm and dry.     Capillary Refill: Capillary refill takes less than 2 seconds.  Neurological:     General: No focal deficit present.     Mental Status: He is alert and oriented to person, place, and time.  Psychiatric:        Mood and Affect: Mood normal.         Behavior: Behavior normal.      Pertinent Labs, Studies, and Procedures:     Latest Ref Rng & Units 01/01/2023    3:54 AM 12/31/2022    2:17 AM 12/30/2022    1:49 AM  CBC  WBC 4.0 - 10.5 K/uL 9.5  12.2  16.1   Hemoglobin 13.0 - 17.0 g/dL 62.9  52.8  41.3   Hematocrit 39.0 - 52.0 % 35.2  39.1  38.3   Platelets 150 - 400 K/uL 224  271  272        Latest Ref Rng & Units 01/02/2023    4:07 PM 01/02/2023    2:20 AM 01/01/2023    3:54 AM  CMP  Glucose 70 - 99 mg/dL  98  64   BUN 6 - 20 mg/dL  10  18   Creatinine 1.61 - 1.24 mg/dL  0.96  0.45   Sodium 409 - 145 mmol/L  136  136   Potassium 3.5 - 5.1 mmol/L 3.7  2.9  3.5   Chloride 98 - 111 mmol/L  96  99   CO2 22 - 32 mmol/L  29  25   Calcium 8.9 - 10.3 mg/dL  8.3  8.2     DG Abd Portable 1V  Result Date: 12/31/2022 CLINICAL DATA:  Small-bowel obstruction. EXAM: PORTABLE ABDOMEN - 1 VIEW COMPARISON:  X-ray 12/31/2022 12:15 a.m. FINDINGS: Enteric tube overlying the stomach. Once again there is moderate diffuse colonic stool. Several dilated loops of small bowel in the midabdomen again identified consistent with known history of obstruction. No obvious free air on these portable supine radiographs. IMPRESSION: Continued dilatation of small bowel in the midabdomen with diffuse colonic stool. Electronically Signed   By: Karen Kays M.D.   On: 12/31/2022 10:55   DG Abd Portable 1V-Small Bowel Obstruction Protocol-initial, 8 hr delay  Result Date: 12/31/2022 CLINICAL DATA:  Abdominal pain, 8 hour follow-up exam EXAM: PORTABLE ABDOMEN - 1 VIEW COMPARISON:  Gastric catheter is again noted within the stomach. FINDINGS: Scattered large and small bowel gas is noted. Multiple dilated loops of small bowel are again identified and stable. Administered contrast lies within dilated small bowel loops. No colonic contrast is seen. Postsurgical changes in the lumbar spine are noted. No free air is seen. IMPRESSION: Persistent small bowel dilatation. No  colonic contrast is seen. Recommend 24 hour follow-up film Electronically Signed   By: Alcide Clever M.D.   On: 12/31/2022 00:23   DG Abd 1 View  Result Date: 12/30/2022 CLINICAL DATA:  Evaluate nasogastric tube placement. EXAM: ABDOMEN - 1 VIEW COMPARISON:  CT from earlier today FINDINGS: Enteric tube tip and side port are below the level of the GE junction in the expected location of the gastric fundus. Visualized portions of the bowel gas pattern appear unchanged from CT performed earlier today. Postoperative change within the lower thoracic spine. IMPRESSION: Enteric tube tip and side port are below the level of the GE junction in the expected location of the gastric fundus. Electronically Signed   By: Signa Kell M.D.   On: 12/30/2022 09:41   CT ABDOMEN PELVIS W CONTRAST  Result Date: 12/30/2022 CLINICAL DATA:  Abdominal pain, acute.  History of SBO. EXAM: CT ABDOMEN AND PELVIS WITH CONTRAST TECHNIQUE: Multidetector CT imaging of the abdomen and pelvis was performed using the standard protocol following bolus administration of intravenous contrast. RADIATION DOSE REDUCTION: This exam was performed according to the departmental dose-optimization program which includes automated exposure control, adjustment of the mA and/or kV according to patient size and/or use of iterative reconstruction technique. CONTRAST:  75mL OMNIPAQUE IOHEXOL 350 MG/ML SOLN COMPARISON:  03/31/2021 FINDINGS: Lower chest: Scarring is noted within the left lower lobe. No acute abnormality. 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 The largest is in segment 2/3 measuring 1.7 cm. These appear unchanged from previous exam. Gallbladder appears normal. No bile duct dilatation. Pancreas: Unremarkable. No pancreatic ductal dilatation or surrounding inflammatory changes. Spleen: Normal in size without focal abnormality. Adrenals/Urinary Tract: Normal  adrenal glands. Inferior pole right kidney stones measure up to 6 mm. Unchanged. No mass or hydronephrosis identified bilaterally. Urinary bladder appears within normal limits. Stomach/Bowel: Stomach appears normal. Proximal small bowel  loops are unremarkable. Postoperative changes from right hemicolectomy with right upper quadrant enterocolonic anastomosis. There is abnormal dilatation of the mid and distal small bowel loops with transition point in the right upper quadrant of the abdomen where there is mildly thickened ileum just proximal to the enterocolonic anastomosis, image 43/3. Normal caliber of the colon. Vascular/Lymphatic: Normal appearance of the abdominal aorta. No enlarged abdominopelvic lymph nodes. Reproductive: Prostate is unremarkable. Other: Trace fluid is seen along the right paracolic gutter. No focal fluid collections. No signs of pneumoperitoneum. Musculoskeletal: No acute or significant osseous findings. Postoperative changes are identified within the lower thoracic and lumbar spine. No acute or suspicious osseous findings. IMPRESSION: 1. There is abnormal dilatation of the mid and distal small bowel loops with transition point in the right upper quadrant of the abdomen where there is mildly thickened ileum just proximal to the enterocolonic anastomosis. Findings are compatible with small bowel obstruction. 2. Trace fluid is seen along the right paracolic gutter. No focal fluid collections. No signs of pneumoperitoneum. 3. Nonobstructing right kidney stones. 4. 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 Electronically Signed   By: Signa Kell M.D.   On: 12/30/2022 08:10    Signed: Katheran James, DO Internal Medicine Resident, PGY-1 [facility]  Pager: [phone] 5:30 PM, 01/02/2023

## 2023-01-02 NOTE — Plan of Care (Signed)

## 2023-01-02 NOTE — Progress Notes (Signed)
Subjective: Pain is overall improving, just thinks he is sore from bruising from heparin injections.  Tolerated CLD, no nausea, + BM  ROS: See above, otherwise other systems negative  Objective: Vital signs in last 24 hours: Temp:  [98.3 F (36.8 C)-98.5 F (36.9 C)] 98.3 F (36.8 C) (09/18 0756) Pulse Rate:  [58-68] 61 (09/18 0756) Resp:  [17] 17 (09/18 0756) BP: (96-109)/(60-63) 108/60 (09/18 0756) SpO2:  [97 %-100 %] 100 % (09/18 0756) Weight:  [75.2 kg] 75.2 kg (09/18 0500) Last BM Date : 12/31/22  Intake/Output from previous day: 09/17 0701 - 09/18 0700 In: 480 [P.O.:480] Out: 756 [Urine:6; Emesis/NG output:750] Intake/Output this shift: No intake/output data recorded.  PE: Abd: soft, only tender in RLQ today over ecchymosis from heparin, ND  Lab Results:  Recent Labs    12/31/22 0217 01/01/23 0354  WBC 12.2* 9.5  HGB 12.7* 11.7*  HCT 39.1 35.2*  PLT 271 224   BMET Recent Labs    01/01/23 0354 01/02/23 0220  NA 136 136  K 3.5 2.9*  CL 99 96*  CO2 25 29  GLUCOSE 64* 98  BUN 18 10  CREATININE 0.94 0.72  CALCIUM 8.2* 8.3*   PT/INR No results for input(s): "LABPROT", "INR" in the last 72 hours. CMP     Component Value Date/Time   NA 136 01/02/2023 0220   NA 143 11/07/2016 1340   K 2.9 (L) 01/02/2023 0220   K 4.1 11/07/2016 1340   CL 96 (L) 01/02/2023 0220   CO2 29 01/02/2023 0220   CO2 30 (H) 11/07/2016 1340   GLUCOSE 98 01/02/2023 0220   GLUCOSE 87 11/07/2016 1340   BUN 10 01/02/2023 0220   BUN 3.2 (L) 11/07/2016 1340   CREATININE 0.72 01/02/2023 0220   CREATININE 1.0 11/07/2016 1340   CALCIUM 8.3 (L) 01/02/2023 0220   CALCIUM 10.1 11/07/2016 1340   PROT 7.5 12/30/2022 0149   PROT 7.9 11/07/2016 1340   ALBUMIN 3.9 12/30/2022 0149   ALBUMIN 4.5 11/07/2016 1340   AST 22 12/30/2022 0149   AST 31 11/07/2016 1340   ALT 17 12/30/2022 0149   ALT 30 11/07/2016 1340   ALKPHOS 86 12/30/2022 0149   ALKPHOS 98 11/07/2016 1340   BILITOT  0.4 12/30/2022 0149   BILITOT 0.45 11/07/2016 1340   GFRNONAA >60 01/02/2023 0220   GFRAA >60 06/12/2016 0544   Lipase     Component Value Date/Time   LIPASE 35 12/30/2022 0149       Studies/Results: DG Abd Portable 1V  Result Date: 12/31/2022 CLINICAL DATA:  Small-bowel obstruction. EXAM: PORTABLE ABDOMEN - 1 VIEW COMPARISON:  X-ray 12/31/2022 12:15 a.m. FINDINGS: Enteric tube overlying the stomach. Once again there is moderate diffuse colonic stool. Several dilated loops of small bowel in the midabdomen again identified consistent with known history of obstruction. No obvious free air on these portable supine radiographs. IMPRESSION: Continued dilatation of small bowel in the midabdomen with diffuse colonic stool. Electronically Signed   By: Karen Kays M.D.   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 In 2018 he underwent ileostomy takedown. Last SBO 2022 - tolerating CLD -adv to soft diet, if tolerates this can DC home later today - patient seems ready to go home. -  having good bowel function at this time -relayed recs to primary service  ID - none VTE - SCDs, sq heparin FEN - IVF, soft diet  I reviewed hospitalist notes, last 24 h vitals and pain scores, last 48 h intake and output, last 24 h labs and trends, and last 24 h imaging results.   LOS: 2 days    Letha Cape , [facility] 01/02/2023, 9:07 AM Please see Amion for pager number during day hours 7:00am-4:30pm or 7:00am -11:30am on weekends

## 2023-01-02 NOTE — Discharge Instructions (Signed)
Thank you for allowing [place] to be part of your care. You were hospitalized for a small bowel obstruction. You were medically stable for discharge on your third day with [place]. At home, ease back into your normal activities.  Please reach out to your primary doctor. Please discuss your concerns about your stomach conditions and your chronic pain. Ask for referrals to GI and pain doctors, if you are willing. Given your history, it is very reasonable to see a gastroenterologist.

## 2023-04-26 ENCOUNTER — Telehealth: Payer: Self-pay | Admitting: Gastroenterology

## 2023-04-26 NOTE — Telephone Encounter (Signed)
 Good afternoon Dr. Charlanne Supervising MD PM  The following patient is being referred to us  for  low iron, hemoglobin. He does have previous GI history with [facility] and wants to get care closer to them. Records are available with care everywhere. Please review and advise of scheduling. Thank you.

## 2023-05-07 NOTE — Telephone Encounter (Signed)
Please continue with [facility] for now. Extensive GI workup RG

## 2023-05-28 NOTE — Telephone Encounter (Signed)
Contacted PT to give doctor's recommendations. No VM setup.

## 2023-11-12 LAB — COLOGUARD: COLOGUARD: POSITIVE — AB

## 2023-12-19 ENCOUNTER — Other Ambulatory Visit: Payer: Self-pay | Admitting: Family Medicine

## 2023-12-19 DIAGNOSIS — F17210 Nicotine dependence, cigarettes, uncomplicated: Secondary | ICD-10-CM

## 2024-04-11 ENCOUNTER — Encounter (HOSPITAL_COMMUNITY): Payer: Self-pay | Admitting: Family Medicine

## 2024-04-11 ENCOUNTER — Emergency Department (HOSPITAL_COMMUNITY)

## 2024-04-11 ENCOUNTER — Inpatient Hospital Stay (HOSPITAL_COMMUNITY)

## 2024-04-11 ENCOUNTER — Inpatient Hospital Stay (HOSPITAL_COMMUNITY)
Admission: EM | Admit: 2024-04-11 | Discharge: 2024-04-15 | DRG: 390 | Disposition: A | Discharge status: Home / Self Care | Attending: Internal Medicine | Admitting: Internal Medicine

## 2024-04-11 ENCOUNTER — Other Ambulatory Visit: Payer: Self-pay

## 2024-04-11 DIAGNOSIS — R112 Nausea with vomiting, unspecified: Secondary | ICD-10-CM | POA: Diagnosis not present

## 2024-04-11 DIAGNOSIS — G43909 Migraine, unspecified, not intractable, without status migrainosus: Secondary | ICD-10-CM | POA: Diagnosis present

## 2024-04-11 DIAGNOSIS — G894 Chronic pain syndrome: Secondary | ICD-10-CM

## 2024-04-11 DIAGNOSIS — Z886 Allergy status to analgesic agent status: Secondary | ICD-10-CM

## 2024-04-11 DIAGNOSIS — R1084 Generalized abdominal pain: Secondary | ICD-10-CM | POA: Diagnosis not present

## 2024-04-11 DIAGNOSIS — Z1152 Encounter for screening for COVID-19: Secondary | ICD-10-CM

## 2024-04-11 DIAGNOSIS — E162 Hypoglycemia, unspecified: Secondary | ICD-10-CM | POA: Diagnosis present

## 2024-04-11 DIAGNOSIS — Z888 Allergy status to other drugs, medicaments and biological substances status: Secondary | ICD-10-CM

## 2024-04-11 DIAGNOSIS — Z87891 Personal history of nicotine dependence: Secondary | ICD-10-CM | POA: Diagnosis not present

## 2024-04-11 DIAGNOSIS — Z885 Allergy status to narcotic agent status: Secondary | ICD-10-CM

## 2024-04-11 DIAGNOSIS — J45909 Unspecified asthma, uncomplicated: Secondary | ICD-10-CM | POA: Diagnosis present

## 2024-04-11 DIAGNOSIS — I5031 Acute diastolic (congestive) heart failure: Secondary | ICD-10-CM | POA: Diagnosis not present

## 2024-04-11 DIAGNOSIS — Z91040 Latex allergy status: Secondary | ICD-10-CM | POA: Diagnosis not present

## 2024-04-11 DIAGNOSIS — M7989 Other specified soft tissue disorders: Secondary | ICD-10-CM | POA: Diagnosis present

## 2024-04-11 DIAGNOSIS — E876 Hypokalemia: Secondary | ICD-10-CM | POA: Diagnosis not present

## 2024-04-11 DIAGNOSIS — K56609 Unspecified intestinal obstruction, unspecified as to partial versus complete obstruction: Secondary | ICD-10-CM | POA: Diagnosis present

## 2024-04-11 DIAGNOSIS — G8929 Other chronic pain: Secondary | ICD-10-CM | POA: Diagnosis present

## 2024-04-11 LAB — COMPREHENSIVE METABOLIC PANEL WITH GFR
ALT: 13 U/L (ref 0–44)
AST: 27 U/L (ref 15–41)
Albumin: 4 g/dL (ref 3.5–5.0)
Alkaline Phosphatase: 98 U/L (ref 38–126)
Anion gap: 10 (ref 5–15)
BUN: 9 mg/dL (ref 6–20)
CO2: 27 mmol/L (ref 22–32)
Calcium: 9.2 mg/dL (ref 8.9–10.3)
Chloride: 102 mmol/L (ref 98–111)
Creatinine, Ser: 0.78 mg/dL (ref 0.61–1.24)
GFR, Estimated: >60 mL/min
Glucose, Bld: 107 mg/dL — ABNORMAL HIGH (ref 70–99)
Potassium: 3.6 mmol/L (ref 3.5–5.1)
Sodium: 139 mmol/L (ref 135–145)
Total Bilirubin: 0.3 mg/dL (ref 0.0–1.2)
Total Protein: 6.9 g/dL (ref 6.5–8.1)

## 2024-04-11 LAB — CBC
HCT: 40 % (ref 39.0–52.0)
Hemoglobin: 13.5 g/dL (ref 13.0–17.0)
MCH: 31.5 pg (ref 26.0–34.0)
MCHC: 33.8 g/dL (ref 30.0–36.0)
MCV: 93.5 fL (ref 80.0–100.0)
Platelets: 228 K/uL (ref 150–400)
RBC: 4.28 MIL/uL (ref 4.22–5.81)
RDW: 12.7 % (ref 11.5–15.5)
WBC: 17.3 K/uL — ABNORMAL HIGH (ref 4.0–10.5)
nRBC: 0 % (ref 0.0–0.2)

## 2024-04-11 LAB — URINALYSIS, ROUTINE W REFLEX MICROSCOPIC
Bacteria, UA: NONE SEEN
Bilirubin Urine: NEGATIVE
Glucose, UA: NEGATIVE mg/dL
Ketones, ur: NEGATIVE mg/dL
Leukocytes,Ua: NEGATIVE
Nitrite: NEGATIVE
Protein, ur: NEGATIVE mg/dL
Specific Gravity, Urine: 1.01 (ref 1.005–1.030)
pH: 8 (ref 5.0–8.0)

## 2024-04-11 LAB — LIPASE, BLOOD: Lipase: 16 U/L (ref 11–51)

## 2024-04-11 MED ORDER — MORPHINE SULFATE (PF) 2 MG/ML IV SOLN
2.0000 mg | Freq: Once | INTRAVENOUS | Status: AC
  Administered 2024-04-11: 2 mg via INTRAVENOUS
  Filled 2024-04-11: qty 1

## 2024-04-11 MED ORDER — ONDANSETRON HCL 4 MG/2ML IJ SOLN: 4.0000 mg | Freq: Four times a day (QID) | INTRAMUSCULAR | Status: DC | PRN

## 2024-04-11 MED ORDER — ONDANSETRON HCL 4 MG/2ML IJ SOLN
4.0000 mg | Freq: Once | INTRAMUSCULAR | Status: AC
  Administered 2024-04-11: 4 mg via INTRAVENOUS
  Filled 2024-04-11: qty 2

## 2024-04-11 MED ORDER — LACTATED RINGERS IV BOLUS
1000.0000 mL | Freq: Once | INTRAVENOUS | Status: AC
  Administered 2024-04-11: 1000 mL via INTRAVENOUS

## 2024-04-11 MED ORDER — MORPHINE SULFATE (PF) 4 MG/ML IV SOLN
4.0000 mg | Freq: Once | INTRAVENOUS | Status: AC
  Administered 2024-04-11: 4 mg via INTRAVENOUS
  Filled 2024-04-11: qty 1

## 2024-04-11 MED ORDER — IOHEXOL 350 MG/ML SOLN
75.0000 mL | Freq: Once | INTRAVENOUS | Status: AC | PRN
  Administered 2024-04-11: 75 mL via INTRAVENOUS

## 2024-04-11 MED ORDER — ONDANSETRON HCL 4 MG PO TABS: 4.0000 mg | ORAL_TABLET | Freq: Four times a day (QID) | ORAL | Status: DC | PRN

## 2024-04-11 MED ORDER — POTASSIUM CHLORIDE IN NACL 20-0.9 MEQ/L-% IV SOLN
INTRAVENOUS | Status: AC
  Filled 2024-04-11 (×3): qty 1000

## 2024-04-11 MED ORDER — PANTOPRAZOLE SODIUM 40 MG IV SOLR
40.0000 mg | INTRAVENOUS | Status: DC
  Administered 2024-04-11 – 2024-04-14 (×4): 40 mg via INTRAVENOUS
  Filled 2024-04-11 (×3): qty 10

## 2024-04-11 MED ORDER — HYDROMORPHONE HCL 1 MG/ML IJ SOLN
0.5000 mg | INTRAMUSCULAR | Status: DC | PRN
  Filled 2024-04-11: qty 1

## 2024-04-11 MED ORDER — ACETAMINOPHEN 650 MG RE SUPP: 650.0000 mg | Freq: Four times a day (QID) | RECTAL | Status: DC | PRN

## 2024-04-11 MED ORDER — ACETAMINOPHEN 325 MG PO TABS: 650.0000 mg | ORAL_TABLET | Freq: Four times a day (QID) | ORAL | Status: DC | PRN

## 2024-04-11 NOTE — H&P (Signed)
 " History and Physical    [name] FMW:[id number] DOB: [birth year] DOA: 04/11/2024  PCP: [facility]   Patient coming from: Home   Chief Complaint: Abdominal pain, N/V   HPI: [name] is a 58 y.o. 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  Patient reports developing abdominal pain with nausea and vomiting sometime yesterday or early this morning.  Symptoms have been persistent and he suspects he has a recurrent bowel obstruction.  EMS was called and he had a soft bowel movement just prior to being transported to the ED.  ED Course: Upon arrival to the ED, patient is found to be afebrile and saturating well on room air with stable BP.  CMP and lipase are essentially normal and CBC is notable for leukocytosis to 17,300.  CT is concerning for SBO with transition point at the level of focal ileitis just proximal to the ileocolic anastomosis.  Surgery was consulted by the ED physician and the patient was given a liter of LR, morphine  x 2, and Zofran .  Review of Systems:  All other systems reviewed and apart from HPI, are negative.  Past Medical History:  Diagnosis Date   Anxiety    panic attacks   Asthma    Complication of anesthesia     I got confused    Foot drop, left    Headache    Migraine   History of blood transfusion    Ischemic bowel disease    Pneumonia    in past   Septic shock (HCC) 01/2016   Small bowel perforation (HCC) 01/2016    Past Surgical History:  Procedure Laterality Date   APPLICATION OF WOUND VAC N/A 02/08/2016   Procedure: APPLICATION OF WOUND VAC;  Surgeon: Lynwood Pina, MD;  Location: [facility] OR;  Service: General;  Laterality: N/A;   BACK SURGERY     x3   BOWEL  RESECTION N/A 01/27/2016   Procedure: SMALL BOWEL RESECTION;  Surgeon: Camellia Blush, MD;  Location: [facility] OR;  Service: General;  Laterality: N/A;   EXPLORATORY LAPAROTOMY  01/27/2016   ILEOCECETOMY N/A 02/08/2016   Procedure: MAUDRY;  Surgeon: Lynwood Pina, MD;  Location: [facility] OR;  Service: General;  Laterality: N/A;   ILEOSTOMY N/A 02/10/2016   Procedure: ILEOSTOMY;  Surgeon: Lynwood Pina, MD;  Location: [facility] OR;  Service: General;  Laterality: N/A;   ILEOSTOMY CLOSURE N/A 06/07/2016   Procedure: ILEOSTOMY TAKEDOWN;  Surgeon: Lynwood Pina, MD;  Location: [facility] OR;  Service: General;  Laterality: N/A;   LAPAROTOMY N/A 01/27/2016   Procedure: EXPLORATORY LAPAROTOMY;  Surgeon: Camellia Blush, MD;  Location: [facility] OR;  Service: General;  Laterality: N/A;   LAPAROTOMY N/A 02/08/2016   Procedure: EXPLORATORY LAPAROTOMY;  Surgeon: Lynwood Pina, MD;  Location: [facility] OR;  Service: General;  Laterality: N/A;   LAPAROTOMY N/A 02/10/2016   Procedure: EXPLORATORY LAPAROTOMY;  Surgeon: Lynwood Pina, MD;  Location: [facility] OR;  Service: General;  Laterality: N/A;   VACUUM ASSISTED CLOSURE CHANGE N/A 02/12/2016   Procedure: EXPLORATORY LAPAROTOMY WITH CLOSURE;  Surgeon: Debby Shipper, MD;  Location: [facility] OR;  Service: General;  Laterality: N/A;    Social History:   reports that he quit smoking about 8 years ago. His smoking use included cigarettes. He started smoking about 42  years ago. He has a 17 pack-year smoking history. He has never used smokeless tobacco. He reports that he does not drink alcohol and does not use drugs.  Allergies[1]  History reviewed. No pertinent family history.   Prior to Admission medications  Medication Sig Start Date End Date Taking? Authorizing Provider  acetaminophen  (TYLENOL ) 500 MG tablet Take 500-1,000 mg by mouth every 6 (six) hours as needed for mild pain.    [provider]  melatonin 5 MG TABS Take 1 tablet (5 mg total) by mouth at bedtime as needed (for sleep). 04/03/21    Marry Clamp, MD    Physical Exam: Vitals:   04/11/24 1900 04/11/24 1930 04/11/24 2000 04/11/24 2030  BP: (!) 116/59 115/63 124/65 124/64  Pulse: 72 63 (!) 57 (!) 58  Resp: 18 18    Temp:      TempSrc:      SpO2: 99% 100% 99% 99%    Constitutional: NAD, no pallor or diaphoresis  Eyes: PERTLA, lids and conjunctivae normal ENMT: Mucous membranes are moist. Posterior pharynx clear of any exudate or lesions.   Neck: supple, no masses  Respiratory: no wheezing, no crackles. No accessory muscle use.  Cardiovascular: S1 & S2 heard, regular rate and rhythm. Pretibial pitting edema, L>R.  Abdomen: Voluntary guarding, tender in mid-abdomen. Occasional high-pitched bowel sound.  Musculoskeletal: no clubbing / cyanosis. No joint deformity upper and lower extremities.   Skin: no significant rashes, lesions, ulcers. Warm, dry, well-perfused. Neurologic: CN 2-12 grossly intact. Moving all extremities. Alert and oriented.  Psychiatric: Calm. Cooperative.    Labs and Imaging on Admission: I have personally reviewed following labs and imaging studies  CBC: Recent Labs  Lab 04/11/24 1530  WBC 17.3*  HGB 13.5  HCT 40.0  MCV 93.5  PLT 228   Basic Metabolic Panel: Recent Labs  Lab 04/11/24 1530  NA 139  K 3.6  CL 102  CO2 27  GLUCOSE 107*  BUN 9  CREATININE 0.78  CALCIUM  9.2   GFR: CrCl cannot be calculated (Unknown ideal weight.). Liver Function Tests: Recent Labs  Lab 04/11/24 1530  AST 27  ALT 13  ALKPHOS 98  BILITOT 0.3  PROT 6.9  ALBUMIN 4.0   Recent Labs  Lab 04/11/24 1530  LIPASE 16   No results for input(s): AMMONIA in the last 168 hours. Coagulation Profile: No results for input(s): INR, PROTIME in the last 168 hours. Cardiac Enzymes: No results for input(s): CKTOTAL, CKMB, CKMBINDEX, TROPONINI in the last 168 hours. BNP (last 3 results) No results for input(s): PROBNP in the last 8760 hours. HbA1C: No results for input(s): HGBA1C  in the last 72 hours. CBG: No results for input(s): GLUCAP in the last 168 hours. Lipid Profile: No results for input(s): CHOL, HDL, LDLCALC, TRIG, CHOLHDL, LDLDIRECT in the last 72 hours. Thyroid Function Tests: No results for input(s): TSH, T4TOTAL, FREET4, T3FREE, THYROIDAB in the last 72 hours. Anemia Panel: No results for input(s): VITAMINB12, FOLATE, FERRITIN, TIBC, IRON, RETICCTPCT in the last 72 hours. Urine analysis:    Component Value Date/Time   COLORURINE YELLOW 04/11/2024 1845   APPEARANCEUR CLEAR 04/11/2024 1845   LABSPEC 1.010 04/11/2024 1845   PHURINE 8.0 04/11/2024 1845   GLUCOSEU NEGATIVE 04/11/2024 1845   HGBUR SMALL (A) 04/11/2024 1845   BILIRUBINUR NEGATIVE 04/11/2024 1845   KETONESUR NEGATIVE 04/11/2024 1845   PROTEINUR NEGATIVE 04/11/2024 1845   UROBILINOGEN 1.0 07/28/2007 1539   NITRITE NEGATIVE 04/11/2024 1845   LEUKOCYTESUR NEGATIVE 04/11/2024  1845   Sepsis Labs: @LABRCNTIP (procalcitonin:4,lacticidven:4) )No results found for this or any previous visit (from the past 240 hours).   Radiological Exams on Admission: CT ABDOMEN PELVIS W CONTRAST Result Date: 04/11/2024 EXAM: CT ABDOMEN AND PELVIS WITH CONTRAST 04/11/2024 06:32:00 PM TECHNIQUE: CT of the abdomen and pelvis was performed with the administration of 75 mL of iohexol  (OMNIPAQUE ) 350 MG/ML injection. Multiplanar reformatted images are provided for review. Automated exposure control, iterative reconstruction, and/or weight-based adjustment of the mA/kV was utilized to reduce the radiation dose to as low as reasonably achievable. COMPARISON: CT abdomen and pelvis 12/30/2022. CLINICAL HISTORY: Abdominal pain, acute, nonlocalized. FINDINGS: LOWER CHEST: There is scarring in the left lung base. LIVER: Hypodense lesion in the left lobe of the liver is unchanged measuring 16 mm. This was previously characterized as hemangioma. Subcentimeter hypodensity in the right lobe of  the liver is also unchanged, likely hemangioma or cyst. No new liver lesions are identified. GALLBLADDER AND BILE DUCTS: The gallbladder is within normal limits. No biliary ductal dilatation. SPLEEN: No acute abnormality. PANCREAS: No acute abnormality. ADRENAL GLANDS: No acute abnormality. KIDNEYS, URETERS AND BLADDER: Scarring in the superior pole of the right kidney. Punctate calculus in the inferior pole of the right kidney. Left kidney appears normal. The bladder is distended. No hydronephrosis. No perinephric or periureteral stranding. GI AND BOWEL: Stomach is moderately distended. Right hemicolectomy changes are again seen. Ileocolic anastomosis in the right abdomen is unchanged. Short segment of circumferential wall thickening of the ileum just proximal to the anastomosis worrisome for enteritis. This is a transition point for small bowel obstruction. Small bowel loops proximal to this level are dilated and fluid filled. The colon is completely decompressed which limits its evaluation. Scattered colonic diverticula. PERITONEUM AND RETROPERITONEUM: Trace free fluid in the pelvis. No free air. VASCULATURE: Aorta is normal in caliber. Atherosclerotic calcifications of the aorta and iliac arteries. LYMPH NODES: Prominent bilateral inguinal lymph nodes. REPRODUCTIVE ORGANS: No acute abnormality. BONES AND SOFT TISSUES: Surgical hardware is seen at L3-L4, L4-L5, and in the lower thoracic region. No acute osseous abnormality. No focal soft tissue abnormality. IMPRESSION: 1. Small bowel obstruction with transition point at level of focal ileitis just proximal to ileocolic anastomosis. 2. Moderately distended stomach. 3. Trace free fluid in the pelvis. 4. Prominent bilateral inguinal lymph nodes, nonspecific. Electronically signed by: Greig Pique MD 04/11/2024 08:05 PM EST RP Workstation: [id number]    Assessment/Plan   1. SBO  - Appreciate surgery consultation  - Continue bowel rest, NGT decompression, IVF  hydration, monitor/correct electrolytes    2. Leg swelling - Patient developed b/l LE pitting edema, L>R, over the past 1 month  - Creatinine and albumin are normal and there is no protein on UA; hyperpigmentation in gaiter distribution is noted and suggestive of chronic venous stasis  - Check LE venous Doppler and echocardiogram   3. Chronic pain  - Treating acute pain for now     DVT prophylaxis: SCDs  Code Status: Full  Level of Care: Level of care: Med-Surg Family Communication: Wife at bedside   Disposition Plan:  Patient is from: Home  Anticipated d/c is to: Home  Anticipated d/c date is: 04/15/24  Patient currently: Pending return of bowel function  Consults called: Surgery  Admission status: Inpatient     Evalene GORMAN Sprinkles, MD [facility]  04/11/2024, 8:58 PM       [1]  Allergies Allergen Reactions   Celecoxib Other (See Comments)    IRRITABILITY  Other  reaction(s): IRRITABILITY   Clonazepam Itching   Cyclobenzaprine Other (See Comments)    IRRITABILITY  Other reaction(s): IRRITABILITY   Dexamethasone Other (See Comments)    IRRITABILITY  Other reaction(s): IRRITABILITY   Diazepam Other (See Comments)    IRRITABILITY  Other reaction(s): IRRITABILITY   Etodolac Other (See Comments)    IRRITABILITY  Other reaction(s): IRRITABILITY   Gabapentin Other (See Comments)    IRRITABILITY  Other reaction(s): IRRITABILITY   Latex Itching and Rash   Metaxalone Other (See Comments)    IRRITABILITY (Skelaxin)  Other reaction(s): IRRITABILITY   Methadone Other (See Comments)    IRRITABILITY  Other reaction(s): IRRITABILITY   Methocarbamol Other (See Comments)    IRRITABILITY  Other reaction(s): IRRITABILITY   Other Other (See Comments) and Rash    PLASTIC TAPE TAKES OFF 1-2 LAYERS OF [name] SKIN   Pregabalin Other (See Comments)    Aggression and irritability  Other reaction(s): IRRITABILITY   Propoxyphene Other (See Comments)     IRRITABILITY  Other reaction(s): IRRITABILITY   Tape Other (See Comments) and Rash    PLASTIC TAPE TAKES OFF 1-2 LAYERS OF [name] SKIN   Tramadol Other (See Comments)    IRRITABILITY  Other reaction(s): IRRITABILITY   Wound Dressing Adhesive Other (See Comments) and Rash    PLASTIC TAPE TAKES OFF 1-2 LAYERS OF [name] SKIN, also   Aspirin Other (See Comments)    Cannot take this again- affects bowels- Tylenol  ONLY   Oxycodone  Hcl Other (See Comments)    Made the patient feel uncomfortable   Motrin [Ibuprofen] Other (See Comments)    Cannot take this again- affects bowels- Tylenol  ONLY   Oxycodone  Other (See Comments)    Made the patient feel uncomfortable   "

## 2024-04-11 NOTE — ED Provider Notes (Addendum)
 " [name] EMERGENCY DEPARTMENT AT [facility] Provider Note   CSN: [id number] Arrival date & time: 04/11/24  1501     Patient presents with: Abdominal Pain and Nausea   [name] is a 58 y.o. male.   Pt c/o generalized abd pain in past day. Constant, dull, generalized, non radiating. Hx prior abd surgeries, hx sbo. Did have normal bm yesterday. No dysuria or gu c/o. No back/flank pain. No fever or chills. No chest pain or sob.  The history is provided by the patient and medical records.  Abdominal Pain Associated symptoms: nausea and vomiting   Associated symptoms: no chest pain, no cough, no dysuria, no fever, no shortness of breath and no sore throat        Prior to Admission medications  Medication Sig Start Date End Date Taking? Authorizing Provider  acetaminophen  (TYLENOL ) 500 MG tablet Take 500-1,000 mg by mouth every 6 (six) hours as needed for mild pain.    [provider]  melatonin 5 MG TABS Take 1 tablet (5 mg total) by mouth at bedtime as needed (for sleep). 04/03/21   Marry Clamp, MD    Allergies: Celecoxib, Clonazepam, Cyclobenzaprine, Dexamethasone, Diazepam, Etodolac, Gabapentin, Latex, Metaxalone, Methadone, Methocarbamol, Other, Pregabalin, Propoxyphene, Tape, Tramadol, Wound dressing adhesive, Aspirin, Oxycodone  hcl, Motrin [ibuprofen], and Oxycodone     Review of Systems  Constitutional:  Negative for fever.  HENT:  Negative for sore throat.   Respiratory:  Negative for cough and shortness of breath.   Cardiovascular:  Negative for chest pain.  Gastrointestinal:  Positive for abdominal pain, nausea and vomiting.  Genitourinary:  Negative for dysuria and flank pain.  Musculoskeletal:  Negative for back pain and neck pain.  Skin:  Negative for rash.  Neurological:  Negative for headaches.    Updated Vital Signs BP 124/65   Pulse (!) 57   Temp 98 F (36.7 C) (Oral)   Resp 18   SpO2 99%   Physical Exam Vitals and  nursing note reviewed.  Constitutional:      Appearance: Normal appearance. He is well-developed.  HENT:     Head: Atraumatic.     Nose: Nose normal.     Mouth/Throat:     Mouth: Mucous membranes are moist.     Pharynx: Oropharynx is clear.  Eyes:     General: No scleral icterus.    Conjunctiva/sclera: Conjunctivae normal.  Neck:     Trachea: No tracheal deviation.  Cardiovascular:     Rate and Rhythm: Normal rate and regular rhythm.     Pulses: Normal pulses.     Heart sounds: Normal heart sounds. No murmur heard.    No friction rub. No gallop.  Pulmonary:     Effort: Pulmonary effort is normal. No accessory muscle usage or respiratory distress.     Breath sounds: Normal breath sounds.  Abdominal:     General: Bowel sounds are normal. There is no distension.     Palpations: Abdomen is soft.     Tenderness: There is abdominal tenderness. There is no guarding.     Comments: Mild-mod generalized tenderness, no peritoneal signs. No incarcerated hernia.   Genitourinary:    Comments: No cva tenderness. Normal external gu exam, no scrotal or testicular pain, swelling, or tenderness.  Musculoskeletal:        General: No swelling or tenderness.     Cervical back: Normal range of motion and neck supple. No rigidity.  Skin:    General: Skin is  warm and dry.     Findings: No rash.  Neurological:     Mental Status: He is alert.     Comments: Alert, speech clear.   Psychiatric:        Mood and Affect: Mood normal.     (all labs ordered are listed, but only abnormal results are displayed) Results for orders placed or performed during the hospital encounter of 04/11/24  CBC   Collection Time: 04/11/24  3:30 PM  Result Value Ref Range   WBC 17.3 (H) 4.0 - 10.5 K/uL   RBC 4.28 4.22 - 5.81 MIL/uL   Hemoglobin 13.5 13.0 - 17.0 g/dL   HCT 59.9 60.9 - 47.9 %   MCV 93.5 80.0 - 100.0 fL   MCH 31.5 26.0 - 34.0 pg   MCHC 33.8 30.0 - 36.0 g/dL   RDW 87.2 88.4 - 84.4 %   Platelets 228  150 - 400 K/uL   nRBC 0.0 0.0 - 0.2 %  Comprehensive metabolic panel with GFR   Collection Time: 04/11/24  3:30 PM  Result Value Ref Range   Sodium 139 135 - 145 mmol/L   Potassium 3.6 3.5 - 5.1 mmol/L   Chloride 102 98 - 111 mmol/L   CO2 27 22 - 32 mmol/L   Glucose, Bld 107 (H) 70 - 99 mg/dL   BUN 9 6 - 20 mg/dL   Creatinine, Ser 9.21 0.61 - 1.24 mg/dL   Calcium  9.2 8.9 - 10.3 mg/dL   Total Protein 6.9 6.5 - 8.1 g/dL   Albumin 4.0 3.5 - 5.0 g/dL   AST 27 15 - 41 U/L   ALT 13 0 - 44 U/L   Alkaline Phosphatase 98 38 - 126 U/L   Total Bilirubin 0.3 0.0 - 1.2 mg/dL   GFR, Estimated >39 >39 mL/min   Anion gap 10 5 - 15  Lipase, blood   Collection Time: 04/11/24  3:30 PM  Result Value Ref Range   Lipase 16 11 - 51 U/L  Urinalysis, Routine w reflex microscopic -Urine, Clean Catch   Collection Time: 04/11/24  6:45 PM  Result Value Ref Range   Color, Urine YELLOW YELLOW   APPearance CLEAR CLEAR   Specific Gravity, Urine 1.010 1.005 - 1.030   pH 8.0 5.0 - 8.0   Glucose, UA NEGATIVE NEGATIVE mg/dL   Hgb urine dipstick SMALL (A) NEGATIVE   Bilirubin Urine NEGATIVE NEGATIVE   Ketones, ur NEGATIVE NEGATIVE mg/dL   Protein, ur NEGATIVE NEGATIVE mg/dL   Nitrite NEGATIVE NEGATIVE   Leukocytes,Ua NEGATIVE NEGATIVE   RBC / HPF 6-10 0 - 5 RBC/hpf   WBC, UA 0-5 0 - 5 WBC/hpf   Bacteria, UA NONE SEEN NONE SEEN   Squamous Epithelial / HPF 0-5 0 - 5 /HPF   CT ABDOMEN PELVIS W CONTRAST Result Date: 04/11/2024 EXAM: CT ABDOMEN AND PELVIS WITH CONTRAST 04/11/2024 06:32:00 PM TECHNIQUE: CT of the abdomen and pelvis was performed with the administration of 75 mL of iohexol  (OMNIPAQUE ) 350 MG/ML injection. Multiplanar reformatted images are provided for review. Automated exposure control, iterative reconstruction, and/or weight-based adjustment of the mA/kV was utilized to reduce the radiation dose to as low as reasonably achievable. COMPARISON: CT abdomen and pelvis 12/30/2022. CLINICAL HISTORY:  Abdominal pain, acute, nonlocalized. FINDINGS: LOWER CHEST: There is scarring in the left lung base. LIVER: Hypodense lesion in the left lobe of the liver is unchanged measuring 16 mm. This was previously characterized as hemangioma. Subcentimeter hypodensity in the right lobe of  the liver is also unchanged, likely hemangioma or cyst. No new liver lesions are identified. GALLBLADDER AND BILE DUCTS: The gallbladder is within normal limits. No biliary ductal dilatation. SPLEEN: No acute abnormality. PANCREAS: No acute abnormality. ADRENAL GLANDS: No acute abnormality. KIDNEYS, URETERS AND BLADDER: Scarring in the superior pole of the right kidney. Punctate calculus in the inferior pole of the right kidney. Left kidney appears normal. The bladder is distended. No hydronephrosis. No perinephric or periureteral stranding. GI AND BOWEL: Stomach is moderately distended. Right hemicolectomy changes are again seen. Ileocolic anastomosis in the right abdomen is unchanged. Short segment of circumferential wall thickening of the ileum just proximal to the anastomosis worrisome for enteritis. This is a transition point for small bowel obstruction. Small bowel loops proximal to this level are dilated and fluid filled. The colon is completely decompressed which limits its evaluation. Scattered colonic diverticula. PERITONEUM AND RETROPERITONEUM: Trace free fluid in the pelvis. No free air. VASCULATURE: Aorta is normal in caliber. Atherosclerotic calcifications of the aorta and iliac arteries. LYMPH NODES: Prominent bilateral inguinal lymph nodes. REPRODUCTIVE ORGANS: No acute abnormality. BONES AND SOFT TISSUES: Surgical hardware is seen at L3-L4, L4-L5, and in the lower thoracic region. No acute osseous abnormality. No focal soft tissue abnormality. IMPRESSION: 1. Small bowel obstruction with transition point at level of focal ileitis just proximal to ileocolic anastomosis. 2. Moderately distended stomach. 3. Trace free fluid in  the pelvis. 4. Prominent bilateral inguinal lymph nodes, nonspecific. Electronically signed by: Greig Pique MD 04/11/2024 08:05 PM EST RP Workstation: [id number]     EKG: None  Radiology: CT ABDOMEN PELVIS W CONTRAST Result Date: 04/11/2024 EXAM: CT ABDOMEN AND PELVIS WITH CONTRAST 04/11/2024 06:32:00 PM TECHNIQUE: CT of the abdomen and pelvis was performed with the administration of 75 mL of iohexol  (OMNIPAQUE ) 350 MG/ML injection. Multiplanar reformatted images are provided for review. Automated exposure control, iterative reconstruction, and/or weight-based adjustment of the mA/kV was utilized to reduce the radiation dose to as low as reasonably achievable. COMPARISON: CT abdomen and pelvis 12/30/2022. CLINICAL HISTORY: Abdominal pain, acute, nonlocalized. FINDINGS: LOWER CHEST: There is scarring in the left lung base. LIVER: Hypodense lesion in the left lobe of the liver is unchanged measuring 16 mm. This was previously characterized as hemangioma. Subcentimeter hypodensity in the right lobe of the liver is also unchanged, likely hemangioma or cyst. No new liver lesions are identified. GALLBLADDER AND BILE DUCTS: The gallbladder is within normal limits. No biliary ductal dilatation. SPLEEN: No acute abnormality. PANCREAS: No acute abnormality. ADRENAL GLANDS: No acute abnormality. KIDNEYS, URETERS AND BLADDER: Scarring in the superior pole of the right kidney. Punctate calculus in the inferior pole of the right kidney. Left kidney appears normal. The bladder is distended. No hydronephrosis. No perinephric or periureteral stranding. GI AND BOWEL: Stomach is moderately distended. Right hemicolectomy changes are again seen. Ileocolic anastomosis in the right abdomen is unchanged. Short segment of circumferential wall thickening of the ileum just proximal to the anastomosis worrisome for enteritis. This is a transition point for small bowel obstruction. Small bowel loops proximal to this level are dilated  and fluid filled. The colon is completely decompressed which limits its evaluation. Scattered colonic diverticula. PERITONEUM AND RETROPERITONEUM: Trace free fluid in the pelvis. No free air. VASCULATURE: Aorta is normal in caliber. Atherosclerotic calcifications of the aorta and iliac arteries. LYMPH NODES: Prominent bilateral inguinal lymph nodes. REPRODUCTIVE ORGANS: No acute abnormality. BONES AND SOFT TISSUES: Surgical hardware is seen at L3-L4, L4-L5, and in the lower thoracic  region. No acute osseous abnormality. No focal soft tissue abnormality. IMPRESSION: 1. Small bowel obstruction with transition point at level of focal ileitis just proximal to ileocolic anastomosis. 2. Moderately distended stomach. 3. Trace free fluid in the pelvis. 4. Prominent bilateral inguinal lymph nodes, nonspecific. Electronically signed by: Greig Pique MD 04/11/2024 08:05 PM EST RP Workstation: [id number]     Procedures   Medications Ordered in the ED  lactated ringers  bolus 1,000 mL (0 mLs Intravenous Stopped 04/11/24 1735)  morphine  (PF) 4 MG/ML injection 4 mg (4 mg Intravenous Given 04/11/24 1527)  ondansetron  (ZOFRAN ) injection 4 mg (4 mg Intravenous Given 04/11/24 1527)  iohexol  (OMNIPAQUE ) 350 MG/ML injection 75 mL (75 mLs Intravenous Contrast Given 04/11/24 1831)  morphine  (PF) 4 MG/ML injection 4 mg (4 mg Intravenous Given 04/11/24 1917)                                    Medical Decision Making Problems Addressed: Abdominal pain, generalized: acute illness or injury with systemic symptoms that poses a threat to life or bodily functions Nausea and vomiting in adult: acute illness or injury with systemic symptoms that poses a threat to life or bodily functions Small bowel obstruction (HCC): acute illness or injury with systemic symptoms  Amount and/or Complexity of Data Reviewed Independent Historian: EMS    Details: hx External Data Reviewed: notes. Labs: ordered. Decision-making details  documented in ED Course. Radiology: ordered and independent interpretation performed. Decision-making details documented in ED Course. Discussion of management or test interpretation with external provider(s): Gen surg, medicine  Risk Prescription drug management. Decision regarding hospitalization.   Iv ns. Continuous pulse ox and cardiac monitoring. Labs ordered/sent. Imaging ordered.   Differential diagnosis includes sbo, colitis, etc. Dispo decision including potential need for admission considered - will get labs and imaging and reassess.   Reviewed nursing notes and prior charts for additional history. External reports reviewed. Additional history from: EMS.  Cardiac monitor: sinus rhythm, rate 80.  Morphine  iv. Zofran  iv. LR bolus.   Labs reviewed/interpreted by me - wbc elevated. Hgb normal. Ua neg for uti.   CT reviewed/interpreted by me - sbo.   NG to LIWS.    General surgery consulted. Discussed w Dr Polly, indicates ng, ivf, admit to medicine, he will see.   Medicine consulted for admission. Discussed w Dr Charlton, he will see/admit.         Final diagnoses:  None    ED Discharge Orders     None           Bernard Drivers, MD 04/11/24 2051  "

## 2024-04-11 NOTE — Consult Note (Signed)
 "    [name] 06-30-1965  [id number].    Requesting MD: Bernard Chief Complaint/Reason for Consult: SBO  HPI:  58 y/o M with a hx of multiple abdominal surgeries in 2017 for ischemic small bowel requiring SBO, ileocecectomy, and ileostomy w/ subsequent ileostomy takedown who presented to the ED with nausea and emesis. He underwent a CT that showed concern for a SBO proximal to his ileocolonic anastomosis. He has been admitted for SBO in the past but has not required operative intervention (most recently Sept 2024).   On exam, patient is resting in bed. NAD. He reports that he had 3 loose BM and then later in the day developed nausea and emesis. He has issues with loose stools at baseline. WBC 17. He is AF and HDS  ROS: Review of Systems  Constitutional: Negative.   HENT: Negative.    Eyes: Negative.   Respiratory: Negative.    Cardiovascular: Negative.   Gastrointestinal:  Positive for abdominal pain, nausea and vomiting.  Genitourinary: Negative.   Musculoskeletal: Negative.   Skin: Negative.   Neurological: Negative.   Endo/Heme/Allergies: Negative.   Psychiatric/Behavioral: Negative.      No family history on file.  Past Medical History:  Diagnosis Date   Anxiety    panic attacks   Asthma    Complication of anesthesia     I got confused    Foot drop, left    Headache    Migraine   History of blood transfusion    Ischemic bowel disease    Pneumonia    in past   Septic shock (HCC) 01/2016   Small bowel perforation (HCC) 01/2016    Past Surgical History:  Procedure Laterality Date   APPLICATION OF WOUND VAC N/A 02/08/2016   Procedure: APPLICATION OF WOUND VAC;  Surgeon: Lynwood Pina, MD;  Location: [facility] OR;  Service: General;  Laterality: N/A;   BACK SURGERY     x3   BOWEL RESECTION N/A 01/27/2016   Procedure: SMALL BOWEL RESECTION;  Surgeon: Camellia Blush, MD;  Location: [facility] OR;  Service: General;  Laterality: N/A;   EXPLORATORY LAPAROTOMY  01/27/2016    ILEOCECETOMY N/A 02/08/2016   Procedure: MAUDRY;  Surgeon: Lynwood Pina, MD;  Location: [facility] OR;  Service: General;  Laterality: N/A;   ILEOSTOMY N/A 02/10/2016   Procedure: ILEOSTOMY;  Surgeon: Lynwood Pina, MD;  Location: [facility] OR;  Service: General;  Laterality: N/A;   ILEOSTOMY CLOSURE N/A 06/07/2016   Procedure: ILEOSTOMY TAKEDOWN;  Surgeon: Lynwood Pina, MD;  Location: [facility] OR;  Service: General;  Laterality: N/A;   LAPAROTOMY N/A 01/27/2016   Procedure: EXPLORATORY LAPAROTOMY;  Surgeon: Camellia Blush, MD;  Location: [facility] OR;  Service: General;  Laterality: N/A;   LAPAROTOMY N/A 02/08/2016   Procedure: EXPLORATORY LAPAROTOMY;  Surgeon: Lynwood Pina, MD;  Location: [facility] OR;  Service: General;  Laterality: N/A;   LAPAROTOMY N/A 02/10/2016   Procedure: EXPLORATORY LAPAROTOMY;  Surgeon: Lynwood Pina, MD;  Location: [facility] OR;  Service: General;  Laterality: N/A;   VACUUM ASSISTED CLOSURE CHANGE N/A 02/12/2016   Procedure: EXPLORATORY LAPAROTOMY WITH CLOSURE;  Surgeon: Debby Shipper, MD;  Location: [facility] OR;  Service: General;  Laterality: N/A;    Social History:  reports that he quit smoking about 8 years ago. His smoking use included cigarettes. He started smoking about 42 years ago. He has a 17 pack-year smoking history. He has never used smokeless tobacco. He reports that he does not drink alcohol and does not use drugs.  Allergies: Allergies[1]  (Not in a hospital admission)   Physical Exam: Blood pressure 124/65, pulse (!) 57, temperature 98 F (36.7 C), temperature source Oral, resp. rate 18, SpO2 99%. Gen: male, NAD Abd: soft, non-distended, non-tender, well healed midline scar  Results for orders placed or performed during the hospital encounter of 04/11/24 (from the past 48 hours)  CBC     Status: Abnormal   Collection Time: 04/11/24  3:30 PM  Result Value Ref Range   WBC 17.3 (H) 4.0 - 10.5 K/uL   RBC 4.28 4.22 - 5.81 MIL/uL   Hemoglobin 13.5 13.0 - 17.0 g/dL   HCT 59.9 60.9 - 47.9 %    MCV 93.5 80.0 - 100.0 fL   MCH 31.5 26.0 - 34.0 pg   MCHC 33.8 30.0 - 36.0 g/dL   RDW 87.2 88.4 - 84.4 %   Platelets 228 150 - 400 K/uL   nRBC 0.0 0.0 - 0.2 %    Comment: Performed at [facility], [address], [place], [place] [address]  Comprehensive metabolic panel with GFR     Status: Abnormal   Collection Time: 04/11/24  3:30 PM  Result Value Ref Range   Sodium 139 135 - 145 mmol/L   Potassium 3.6 3.5 - 5.1 mmol/L   Chloride 102 98 - 111 mmol/L   CO2 27 22 - 32 mmol/L   Glucose, Bld 107 (H) 70 - 99 mg/dL    Comment: Glucose reference range applies only to samples taken after fasting for at least 8 hours.   BUN 9 6 - 20 mg/dL   Creatinine, Ser 9.21 0.61 - 1.24 mg/dL   Calcium  9.2 8.9 - 10.3 mg/dL   Total Protein 6.9 6.5 - 8.1 g/dL   Albumin 4.0 3.5 - 5.0 g/dL   AST 27 15 - 41 U/L   ALT 13 0 - 44 U/L   Alkaline Phosphatase 98 38 - 126 U/L   Total Bilirubin 0.3 0.0 - 1.2 mg/dL   GFR, Estimated >39 >39 mL/min    Comment: (NOTE) Calculated using the CKD-EPI Creatinine Equation (2021)    Anion gap 10 5 - 15    Comment: Performed at [facility], [address], [place], [place] [address]  Lipase, blood     Status: None   Collection Time: 04/11/24  3:30 PM  Result Value Ref Range   Lipase 16 11 - 51 U/L    Comment: Performed at [facility], [address] [place], [place] [address]  Urinalysis, Routine w reflex microscopic -Urine, Clean Catch     Status: Abnormal   Collection Time: 04/11/24  6:45 PM  Result Value Ref Range   Color, Urine YELLOW YELLOW   APPearance CLEAR CLEAR   Specific Gravity, Urine 1.010 1.005 - 1.030   pH 8.0 5.0 - 8.0   Glucose, UA NEGATIVE NEGATIVE mg/dL   Hgb urine dipstick SMALL (A) NEGATIVE   Bilirubin Urine NEGATIVE NEGATIVE   Ketones, ur NEGATIVE NEGATIVE mg/dL   Protein, ur NEGATIVE NEGATIVE mg/dL   Nitrite NEGATIVE NEGATIVE   Leukocytes,Ua NEGATIVE NEGATIVE   RBC / HPF 6-10 0 - 5 RBC/hpf   WBC, UA 0-5 0 - 5 WBC/hpf    Bacteria, UA NONE SEEN NONE SEEN   Squamous Epithelial / HPF 0-5 0 - 5 /HPF    Comment: Performed at [facility], [address], [place], [place] [address]   CT ABDOMEN PELVIS W CONTRAST Result Date: 04/11/2024  EXAM: CT ABDOMEN AND PELVIS WITH CONTRAST 04/11/2024 06:32:00 PM TECHNIQUE: CT of the abdomen and pelvis was performed with the administration of 75 mL of iohexol  (OMNIPAQUE ) 350 MG/ML injection. Multiplanar reformatted images are provided for review. Automated exposure control, iterative reconstruction, and/or weight-based adjustment of the mA/kV was utilized to reduce the radiation dose to as low as reasonably achievable. COMPARISON: CT abdomen and pelvis 12/30/2022. CLINICAL HISTORY: Abdominal pain, acute, nonlocalized. FINDINGS: LOWER CHEST: There is scarring in the left lung base. LIVER: Hypodense lesion in the left lobe of the liver is unchanged measuring 16 mm. This was previously characterized as hemangioma. Subcentimeter hypodensity in the right lobe of the liver is also unchanged, likely hemangioma or cyst. No new liver lesions are identified. GALLBLADDER AND BILE DUCTS: The gallbladder is within normal limits. No biliary ductal dilatation. SPLEEN: No acute abnormality. PANCREAS: No acute abnormality. ADRENAL GLANDS: No acute abnormality. KIDNEYS, URETERS AND BLADDER: Scarring in the superior pole of the right kidney. Punctate calculus in the inferior pole of the right kidney. Left kidney appears normal. The bladder is distended. No hydronephrosis. No perinephric or periureteral stranding. GI AND BOWEL: Stomach is moderately distended. Right hemicolectomy changes are again seen. Ileocolic anastomosis in the right abdomen is unchanged. Short segment of circumferential wall thickening of the ileum just proximal to the anastomosis worrisome for enteritis. This is a transition point for small bowel obstruction. Small bowel loops proximal to this level are dilated and fluid filled. The  colon is completely decompressed which limits its evaluation. Scattered colonic diverticula. PERITONEUM AND RETROPERITONEUM: Trace free fluid in the pelvis. No free air. VASCULATURE: Aorta is normal in caliber. Atherosclerotic calcifications of the aorta and iliac arteries. LYMPH NODES: Prominent bilateral inguinal lymph nodes. REPRODUCTIVE ORGANS: No acute abnormality. BONES AND SOFT TISSUES: Surgical hardware is seen at L3-L4, L4-L5, and in the lower thoracic region. No acute osseous abnormality. No focal soft tissue abnormality. IMPRESSION: 1. Small bowel obstruction with transition point at level of focal ileitis just proximal to ileocolic anastomosis. 2. Moderately distended stomach. 3. Trace free fluid in the pelvis. 4. Prominent bilateral inguinal lymph nodes, nonspecific. Electronically signed by: Greig Pique MD 04/11/2024 08:05 PM EST RP Workstation: [id number]    Assessment/Plan 58 y/o M with an SBO  - No indication for urgent surgical intervention - Admit to medicine - NPO with NGT to LWS - Likely SB protocol in the AM - Surgery will follow  Cordella DELENA Polly Marlis Cheron Surgery 04/11/2024, 8:31 PM Please see Amion for pager number during day hours 7:00am-4:30pm or 7:00am -11:30am on weekends     [1]  Allergies Allergen Reactions   Celecoxib Other (See Comments)    IRRITABILITY  Other reaction(s): IRRITABILITY   Clonazepam Itching   Cyclobenzaprine Other (See Comments)    IRRITABILITY  Other reaction(s): IRRITABILITY   Dexamethasone Other (See Comments)    IRRITABILITY  Other reaction(s): IRRITABILITY   Diazepam Other (See Comments)    IRRITABILITY  Other reaction(s): IRRITABILITY   Etodolac Other (See Comments)    IRRITABILITY  Other reaction(s): IRRITABILITY   Gabapentin Other (See Comments)    IRRITABILITY  Other reaction(s): IRRITABILITY   Latex Itching and Rash   Metaxalone Other (See Comments)    IRRITABILITY (Skelaxin)  Other reaction(s):  IRRITABILITY   Methadone Other (See Comments)    IRRITABILITY  Other reaction(s): IRRITABILITY   Methocarbamol Other (See Comments)    IRRITABILITY  Other reaction(s): IRRITABILITY   Other Other (See Comments) and Rash  PLASTIC TAPE TAKES OFF 1-2 LAYERS OF [name] SKIN   Pregabalin Other (See Comments)    Aggression and irritability  Other reaction(s): IRRITABILITY   Propoxyphene Other (See Comments)    IRRITABILITY  Other reaction(s): IRRITABILITY   Tape Other (See Comments) and Rash    PLASTIC TAPE TAKES OFF 1-2 LAYERS OF [name] SKIN   Tramadol Other (See Comments)    IRRITABILITY  Other reaction(s): IRRITABILITY   Wound Dressing Adhesive Other (See Comments) and Rash    PLASTIC TAPE TAKES OFF 1-2 LAYERS OF [name] SKIN, also   Aspirin Other (See Comments)    Cannot take this again- affects bowels- Tylenol  ONLY   Oxycodone  Hcl Other (See Comments)    Made the patient feel uncomfortable   Motrin [Ibuprofen] Other (See Comments)    Cannot take this again- affects bowels- Tylenol  ONLY   Oxycodone  Other (See Comments)    Made the patient feel uncomfortable   "

## 2024-04-11 NOTE — ED Triage Notes (Signed)
 Bib [facility] from home c/o abdominal pain in the RUQ and center of the abdomen. Patient has been vomiting since 0400 today. Denies fever, cough, SOB, or chest pain. Denies sick contacts. Extensive medical history concerning his bowels.

## 2024-04-12 ENCOUNTER — Inpatient Hospital Stay (HOSPITAL_COMMUNITY)

## 2024-04-12 DIAGNOSIS — M7989 Other specified soft tissue disorders: Secondary | ICD-10-CM | POA: Diagnosis not present

## 2024-04-12 DIAGNOSIS — K56609 Unspecified intestinal obstruction, unspecified as to partial versus complete obstruction: Secondary | ICD-10-CM | POA: Diagnosis not present

## 2024-04-12 LAB — CBC
HCT: 34.3 % — ABNORMAL LOW (ref 39.0–52.0)
Hemoglobin: 11.6 g/dL — ABNORMAL LOW (ref 13.0–17.0)
MCH: 32 pg (ref 26.0–34.0)
MCHC: 33.8 g/dL (ref 30.0–36.0)
MCV: 94.5 fL (ref 80.0–100.0)
Platelets: 194 K/uL (ref 150–400)
RBC: 3.63 MIL/uL — ABNORMAL LOW (ref 4.22–5.81)
RDW: 12.8 % (ref 11.5–15.5)
WBC: 9.5 K/uL (ref 4.0–10.5)
nRBC: 0 % (ref 0.0–0.2)

## 2024-04-12 LAB — HEPATIC FUNCTION PANEL
ALT: 9 U/L (ref 0–44)
AST: 21 U/L (ref 15–41)
Albumin: 3.2 g/dL — ABNORMAL LOW (ref 3.5–5.0)
Alkaline Phosphatase: 76 U/L (ref 38–126)
Bilirubin, Direct: 0.1 mg/dL (ref 0.0–0.2)
Indirect Bilirubin: 0.2 mg/dL — ABNORMAL LOW (ref 0.3–0.9)
Total Bilirubin: 0.3 mg/dL (ref 0.0–1.2)
Total Protein: 5.5 g/dL — ABNORMAL LOW (ref 6.5–8.1)

## 2024-04-12 LAB — BASIC METABOLIC PANEL WITH GFR
Anion gap: 9 (ref 5–15)
BUN: 10 mg/dL (ref 6–20)
CO2: 23 mmol/L (ref 22–32)
Calcium: 8.1 mg/dL — ABNORMAL LOW (ref 8.9–10.3)
Chloride: 108 mmol/L (ref 98–111)
Creatinine, Ser: 0.78 mg/dL (ref 0.61–1.24)
GFR, Estimated: >60 mL/min
Glucose, Bld: 86 mg/dL (ref 70–99)
Potassium: 3.7 mmol/L (ref 3.5–5.1)
Sodium: 140 mmol/L (ref 135–145)

## 2024-04-12 LAB — MAGNESIUM: Magnesium: 2 mg/dL (ref 1.7–2.4)

## 2024-04-12 LAB — HIV ANTIBODY (ROUTINE TESTING W REFLEX): HIV Screen 4th Generation wRfx: NONREACTIVE

## 2024-04-12 MED ORDER — POTASSIUM CHLORIDE IN NACL 20-0.9 MEQ/L-% IV SOLN
INTRAVENOUS | Status: AC
  Filled 2024-04-12: qty 1000

## 2024-04-12 MED ORDER — MORPHINE SULFATE (PF) 2 MG/ML IV SOLN
2.0000 mg | INTRAVENOUS | Status: DC | PRN
  Administered 2024-04-12: 4 mg via INTRAVENOUS
  Administered 2024-04-12: 3 mg via INTRAVENOUS
  Administered 2024-04-12 – 2024-04-13 (×7): 4 mg via INTRAVENOUS
  Administered 2024-04-13: 2 mg via INTRAVENOUS
  Administered 2024-04-14 (×2): 4 mg via INTRAVENOUS
  Administered 2024-04-14: 2 mg via INTRAVENOUS
  Administered 2024-04-14: 4 mg via INTRAVENOUS
  Filled 2024-04-12 (×11): qty 2

## 2024-04-12 MED ORDER — DIATRIZOATE MEGLUMINE & SODIUM 66-10 % PO SOLN
90.0000 mL | Freq: Once | ORAL | Status: AC
  Administered 2024-04-12: 90 mL via NASOGASTRIC
  Filled 2024-04-12: qty 90

## 2024-04-12 NOTE — Progress Notes (Signed)
 " PROGRESS NOTE    [name]  FMW:[id number] DOB: [birth year] DOA: 04/11/2024 PCP: [facility], [facility]    Brief Narrative:  [name] is a 58 y.o. 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    Assessment and Plan: SBO  - Appreciate surgery consultation  - Continue bowel rest, NGT decompression, IVF hydration, monitor/correct electrolytes      Leg swelling - Patient developed b/l LE pitting edema, L>R, over the past 1 month  - Creatinine and albumin are normal and there is no protein on UA; hyperpigmentation in gaiter distribution is noted and suggestive of chronic venous stasis  - Check LE venous Doppler and echocardiogram    Chronic pain  - Treating acute pain for now     DVT prophylaxis: SCDs Start: 04/11/24 2056    Code Status: Full Code Family Communication:   Disposition Plan:  Level of care: Med-Surg Status is: Inpatient     Consultants:  GS   Subjective: No flatus or BM  Objective: Vitals:   04/11/24 2246 04/12/24 0030 04/12/24 0347 04/12/24 0803  BP:  (!) 131/57 (!) 125/58 121/63  Pulse:  (!) 57 64 61  Resp:  18 20 16   Temp: 98.4 F (36.9 C)  98 F (36.7 C) 98.6 F (37 C)  TempSrc: Oral  Oral Oral  SpO2:  92% 93% 94%    Intake/Output Summary (Last 24 hours) at 04/12/2024 1033 Last data filed at 04/11/2024 1735 Gross per 24 hour  Intake 1000 ml  Output --  Net 1000 ml   There were no vitals filed for this visit.  Examination:   General: Appearance:    Well developed, well nourished male in no acute distress     Lungs:     respirations unlabored  Heart:    Normal heart rate.    MS:   All extremities are intact.    Neurologic:   Awake, alert       Data  Reviewed: I have personally reviewed following labs and imaging studies  CBC: Recent Labs  Lab 04/11/24 1530 04/12/24 0313  WBC 17.3* 9.5  HGB 13.5 11.6*  HCT 40.0 34.3*  MCV 93.5 94.5  PLT 228 194   Basic Metabolic Panel: Recent Labs  Lab 04/11/24 1530 04/12/24 0313  NA 139 140  K 3.6 3.7  CL 102 108  CO2 27 23  GLUCOSE 107* 86  BUN 9 10  CREATININE 0.78 0.78  CALCIUM  9.2 8.1*  MG  --  2.0   GFR: CrCl cannot be calculated (Unknown ideal weight.). Liver Function Tests: Recent Labs  Lab 04/11/24 1530 04/12/24 0313  AST 27 21  ALT 13 9  ALKPHOS 98 76  BILITOT 0.3 0.3  PROT 6.9 5.5*  ALBUMIN 4.0 3.2*   Recent Labs  Lab 04/11/24 1530  LIPASE 16   No results for input(s): AMMONIA in the last 168 hours. Coagulation Profile: No results for input(s): INR, PROTIME in the last 168 hours. Cardiac Enzymes: No results for input(s): CKTOTAL, CKMB, CKMBINDEX, TROPONINI in the last 168 hours. BNP (last 3 results) No results for input(s): PROBNP in the last 8760 hours. HbA1C: No results for input(s): HGBA1C in the last 72 hours. CBG: No results for input(s): GLUCAP in the last 168 hours.  Lipid Profile: No results for input(s): CHOL, HDL, LDLCALC, TRIG, CHOLHDL, LDLDIRECT in the last 72 hours. Thyroid Function Tests: No results for input(s): TSH, T4TOTAL, FREET4, T3FREE, THYROIDAB in the last 72 hours. Anemia Panel: No results for input(s): VITAMINB12, FOLATE, FERRITIN, TIBC, IRON, RETICCTPCT in the last 72 hours. Sepsis Labs: No results for input(s): PROCALCITON, LATICACIDVEN in the last 168 hours.  No results found for this or any previous visit (from the past 240 hours).       Radiology Studies: DG Abd Portable 1 View Result Date: 04/11/2024 EXAM: 1 VIEW XRAY OF THE ABDOMEN 04/11/2024 09:53:29 PM COMPARISON: CT abdomen and pelvis with contrast today at 6:17 pm. CLINICAL HISTORY: post ng placement  FINDINGS: Only the upper abdomen was included in the film. LINES, TUBES AND DEVICES: Nasogastric tube (NGT) has been inserted, extending to the left within the stomach with the tip in the fundal/proximal body of stomach area, adequately inserted. BOWEL: Small bowel dilatation is not as well seen as on CT but is probably unchanged. SOFT TISSUES: There is contrast in the normal caliber renal collecting systems and proximal ureters. BONES: Left lateral side plate fusion hardware is again noted in the lower thoracic spine. No acute fracture. IMPRESSION: 1. NGT in appropriate position. 2. Small bowel dilatation not as well seen as on CT although probably unchanged. Electronically signed by: Francis Quam MD 04/11/2024 10:48 PM EST RP Workstation: [id number]   CT ABDOMEN PELVIS W CONTRAST Result Date: 04/11/2024 EXAM: CT ABDOMEN AND PELVIS WITH CONTRAST 04/11/2024 06:32:00 PM TECHNIQUE: CT of the abdomen and pelvis was performed with the administration of 75 mL of iohexol  (OMNIPAQUE ) 350 MG/ML injection. Multiplanar reformatted images are provided for review. Automated exposure control, iterative reconstruction, and/or weight-based adjustment of the mA/kV was utilized to reduce the radiation dose to as low as reasonably achievable. COMPARISON: CT abdomen and pelvis 12/30/2022. CLINICAL HISTORY: Abdominal pain, acute, nonlocalized. FINDINGS: LOWER CHEST: There is scarring in the left lung base. LIVER: Hypodense lesion in the left lobe of the liver is unchanged measuring 16 mm. This was previously characterized as hemangioma. Subcentimeter hypodensity in the right lobe of the liver is also unchanged, likely hemangioma or cyst. No new liver lesions are identified. GALLBLADDER AND BILE DUCTS: The gallbladder is within normal limits. No biliary ductal dilatation. SPLEEN: No acute abnormality. PANCREAS: No acute abnormality. ADRENAL GLANDS: No acute abnormality. KIDNEYS, URETERS AND BLADDER: Scarring in the superior pole of  the right kidney. Punctate calculus in the inferior pole of the right kidney. Left kidney appears normal. The bladder is distended. No hydronephrosis. No perinephric or periureteral stranding. GI AND BOWEL: Stomach is moderately distended. Right hemicolectomy changes are again seen. Ileocolic anastomosis in the right abdomen is unchanged. Short segment of circumferential wall thickening of the ileum just proximal to the anastomosis worrisome for enteritis. This is a transition point for small bowel obstruction. Small bowel loops proximal to this level are dilated and fluid filled. The colon is completely decompressed which limits its evaluation. Scattered colonic diverticula. PERITONEUM AND RETROPERITONEUM: Trace free fluid in the pelvis. No free air. VASCULATURE: Aorta is normal in caliber. Atherosclerotic calcifications of the aorta and iliac arteries. LYMPH NODES: Prominent bilateral inguinal lymph nodes. REPRODUCTIVE ORGANS: No acute abnormality. BONES AND SOFT TISSUES: Surgical hardware is seen at L3-L4, L4-L5, and in the lower thoracic region. No acute osseous abnormality. No focal soft tissue abnormality. IMPRESSION: 1. Small bowel obstruction with transition point at level of focal ileitis just proximal to ileocolic anastomosis.  2. Moderately distended stomach. 3. Trace free fluid in the pelvis. 4. Prominent bilateral inguinal lymph nodes, nonspecific. Electronically signed by: Greig Pique MD 04/11/2024 08:05 PM EST RP Workstation: [id number]        Scheduled Meds:  diatrizoate  meglumine -sodium  90 mL Per NG tube Once   pantoprazole  (PROTONIX ) IV  40 mg Intravenous Q24H   Continuous Infusions:  0.9 % NaCl with KCl 20 mEq / L 75 mL/hr at 04/12/24 0926     LOS: 1 day    Time spent: 45 minutes spent on chart review, discussion with nursing staff, consultants, updating family and interview/physical exam; more than 50% of that time was spent in counseling and/or coordination of  care.    Harlene RAYMOND Bowl, DO [facility] Available via Epic secure chat 7am-7pm After these hours, please refer to coverage provider listed on [email or web address] 04/12/2024, 10:33 AM   "

## 2024-04-12 NOTE — ED Notes (Signed)
 Pt ambulated to restroom.

## 2024-04-12 NOTE — Progress Notes (Signed)
 As per Dr. Juvenal, ice chips are ok to have

## 2024-04-12 NOTE — Progress Notes (Signed)
 "      Subjective: CC: NGT placed. Not hooked up properly. Fixed.  Patient reports improved abdominal pain, distension and resolution of nausea since NGT placed. No flatus or BM.   Objective: Vital signs in last 24 hours: Temp:  [98 F (36.7 C)-98.6 F (37 C)] 98.6 F (37 C) (12/28 0803) Pulse Rate:  [55-81] 61 (12/28 0803) Resp:  [16-20] 16 (12/28 0803) BP: (115-131)/(57-77) 121/63 (12/28 0803) SpO2:  [92 %-100 %] 94 % (12/28 0803) Last BM Date : 04/11/24  Intake/Output from previous day: 12/27 0701 - 12/28 0700 In: 1000 [IV Piggyback:1000] Out: -  Intake/Output this shift: No intake/output data recorded.  PE: Gen:  Alert, NAD, pleasant Abd: Soft, mild distension, mild generalized ttp without rigidity or guarding, prior abdominal scars noted and well healed, NGT in place  Lab Results:  Recent Labs    04/11/24 1530 04/12/24 0313  WBC 17.3* 9.5  HGB 13.5 11.6*  HCT 40.0 34.3*  PLT 228 194   BMET Recent Labs    04/11/24 1530 04/12/24 0313  NA 139 140  K 3.6 3.7  CL 102 108  CO2 27 23  GLUCOSE 107* 86  BUN 9 10  CREATININE 0.78 0.78  CALCIUM  9.2 8.1*   PT/INR No results for input(s): LABPROT, INR in the last 72 hours. CMP     Component Value Date/Time   NA 140 04/12/2024 0313   NA 143 11/07/2016 1340   K 3.7 04/12/2024 0313   K 4.1 11/07/2016 1340   CL 108 04/12/2024 0313   CO2 23 04/12/2024 0313   CO2 30 (H) 11/07/2016 1340   GLUCOSE 86 04/12/2024 0313   GLUCOSE 87 11/07/2016 1340   BUN 10 04/12/2024 0313   BUN 3.2 (L) 11/07/2016 1340   CREATININE 0.78 04/12/2024 0313   CREATININE 1.0 11/07/2016 1340   CALCIUM  8.1 (L) 04/12/2024 0313   CALCIUM  10.1 11/07/2016 1340   PROT 5.5 (L) 04/12/2024 0313   PROT 7.9 11/07/2016 1340   ALBUMIN 3.2 (L) 04/12/2024 0313   ALBUMIN 4.5 11/07/2016 1340   AST 21 04/12/2024 0313   AST 31 11/07/2016 1340   ALT 9 04/12/2024 0313   ALT 30 11/07/2016 1340   ALKPHOS 76 04/12/2024 0313   ALKPHOS 98  11/07/2016 1340   BILITOT 0.3 04/12/2024 0313   BILITOT 0.45 11/07/2016 1340   GFRNONAA >60 04/12/2024 0313   GFRAA >60 06/12/2016 0544   Lipase     Component Value Date/Time   LIPASE 16 04/11/2024 1530    Studies/Results: DG Abd Portable 1 View Result Date: 04/11/2024 EXAM: 1 VIEW XRAY OF THE ABDOMEN 04/11/2024 09:53:29 PM COMPARISON: CT abdomen and pelvis with contrast today at 6:17 pm. CLINICAL HISTORY: post ng placement FINDINGS: Only the upper abdomen was included in the film. LINES, TUBES AND DEVICES: Nasogastric tube (NGT) has been inserted, extending to the left within the stomach with the tip in the fundal/proximal body of stomach area, adequately inserted. BOWEL: Small bowel dilatation is not as well seen as on CT but is probably unchanged. SOFT TISSUES: There is contrast in the normal caliber renal collecting systems and proximal ureters. BONES: Left lateral side plate fusion hardware is again noted in the lower thoracic spine. No acute fracture. IMPRESSION: 1. NGT in appropriate position. 2. Small bowel dilatation not as well seen as on CT although probably unchanged. Electronically signed by: Francis Quam MD 04/11/2024 10:48 PM EST RP Workstation: [id number]   CT ABDOMEN PELVIS W CONTRAST Result Date:  04/11/2024 EXAM: CT ABDOMEN AND PELVIS WITH CONTRAST 04/11/2024 06:32:00 PM TECHNIQUE: CT of the abdomen and pelvis was performed with the administration of 75 mL of iohexol  (OMNIPAQUE ) 350 MG/ML injection. Multiplanar reformatted images are provided for review. Automated exposure control, iterative reconstruction, and/or weight-based adjustment of the mA/kV was utilized to reduce the radiation dose to as low as reasonably achievable. COMPARISON: CT abdomen and pelvis 12/30/2022. CLINICAL HISTORY: Abdominal pain, acute, nonlocalized. FINDINGS: LOWER CHEST: There is scarring in the left lung base. LIVER: Hypodense lesion in the left lobe of the liver is unchanged measuring 16 mm. This  was previously characterized as hemangioma. Subcentimeter hypodensity in the right lobe of the liver is also unchanged, likely hemangioma or cyst. No new liver lesions are identified. GALLBLADDER AND BILE DUCTS: The gallbladder is within normal limits. No biliary ductal dilatation. SPLEEN: No acute abnormality. PANCREAS: No acute abnormality. ADRENAL GLANDS: No acute abnormality. KIDNEYS, URETERS AND BLADDER: Scarring in the superior pole of the right kidney. Punctate calculus in the inferior pole of the right kidney. Left kidney appears normal. The bladder is distended. No hydronephrosis. No perinephric or periureteral stranding. GI AND BOWEL: Stomach is moderately distended. Right hemicolectomy changes are again seen. Ileocolic anastomosis in the right abdomen is unchanged. Short segment of circumferential wall thickening of the ileum just proximal to the anastomosis worrisome for enteritis. This is a transition point for small bowel obstruction. Small bowel loops proximal to this level are dilated and fluid filled. The colon is completely decompressed which limits its evaluation. Scattered colonic diverticula. PERITONEUM AND RETROPERITONEUM: Trace free fluid in the pelvis. No free air. VASCULATURE: Aorta is normal in caliber. Atherosclerotic calcifications of the aorta and iliac arteries. LYMPH NODES: Prominent bilateral inguinal lymph nodes. REPRODUCTIVE ORGANS: No acute abnormality. BONES AND SOFT TISSUES: Surgical hardware is seen at L3-L4, L4-L5, and in the lower thoracic region. No acute osseous abnormality. No focal soft tissue abnormality. IMPRESSION: 1. Small bowel obstruction with transition point at level of focal ileitis just proximal to ileocolic anastomosis. 2. Moderately distended stomach. 3. Trace free fluid in the pelvis. 4. Prominent bilateral inguinal lymph nodes, nonspecific. Electronically signed by: Greig Pique MD 04/11/2024 08:05 PM EST RP Workstation: [id number]     Anti-infectives: Anti-infectives (From admission, onward)    None        Assessment/Plan SBO - CT w/ Small bowel obstruction with transition point at level of focal ileitis just proximal to ileocolic anastomosis. 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 He has been admitted for SBO in the past but has not required operative intervention (most recently Sept 2024).  - HDS without fever, tachycardia or hypotension. No peritonitis on exam. WBC wnl. No current indication for emergency surgery - Cont NPO, NGT to LIWS - Start SBO protocol - Keep K >=4, Phos >= 3, Mg >= 2 and mobilize for bowel function. Okay to clamp NGT for mobilization - Hopefully patient will improve with conservative management. If patient fails to improve with conservative management, they may require exploratory surgery during admission - We will follow with you.   FEN - NPO, NGT to LIWS VTE - SCDs, okay for chem ppx from a general surgery standpoint ID - None   I reviewed nursing notes, hospitalist notes, last 24 h vitals and pain scores, last 48 h intake and output, last 24 h labs and trends, and last 24 h imaging results.    LOS:  1 day    Ozell CHRISTELLA Shaper, [facility] 04/12/2024, 9:31 AM Please see Amion for pager number during day hours 7:00am-4:30pm  "

## 2024-04-12 NOTE — Progress Notes (Signed)
 Bilateral lower extremity venous duplex has been completed.  Results can be found in chart review under CV Proc.  04/12/2024 2:32 PM  Edilia Elden Appl, RVT.

## 2024-04-13 ENCOUNTER — Inpatient Hospital Stay (HOSPITAL_COMMUNITY)

## 2024-04-13 DIAGNOSIS — K56609 Unspecified intestinal obstruction, unspecified as to partial versus complete obstruction: Secondary | ICD-10-CM | POA: Diagnosis not present

## 2024-04-13 DIAGNOSIS — I5031 Acute diastolic (congestive) heart failure: Secondary | ICD-10-CM | POA: Diagnosis not present

## 2024-04-13 LAB — ECHOCARDIOGRAM COMPLETE
Area-P 1/2: 2.13 cm2
Height: 71 in
S' Lateral: 3.1 cm

## 2024-04-13 LAB — BASIC METABOLIC PANEL WITH GFR
Anion gap: 10 (ref 5–15)
BUN: 15 mg/dL (ref 6–20)
CO2: 21 mmol/L — ABNORMAL LOW (ref 22–32)
Calcium: 8.3 mg/dL — ABNORMAL LOW (ref 8.9–10.3)
Chloride: 105 mmol/L (ref 98–111)
Creatinine, Ser: 0.64 mg/dL (ref 0.61–1.24)
GFR, Estimated: >60 mL/min
Glucose, Bld: 71 mg/dL (ref 70–99)
Potassium: 3.3 mmol/L — ABNORMAL LOW (ref 3.5–5.1)
Sodium: 136 mmol/L (ref 135–145)

## 2024-04-13 LAB — CBC
HCT: 32.9 % — ABNORMAL LOW (ref 39.0–52.0)
Hemoglobin: 11.3 g/dL — ABNORMAL LOW (ref 13.0–17.0)
MCH: 31.6 pg (ref 26.0–34.0)
MCHC: 34.3 g/dL (ref 30.0–36.0)
MCV: 91.9 fL (ref 80.0–100.0)
Platelets: 186 K/uL (ref 150–400)
RBC: 3.58 MIL/uL — ABNORMAL LOW (ref 4.22–5.81)
RDW: 12.8 % (ref 11.5–15.5)
WBC: 7.6 K/uL (ref 4.0–10.5)
nRBC: 0 % (ref 0.0–0.2)

## 2024-04-13 MED ORDER — ENOXAPARIN SODIUM 40 MG/0.4ML IJ SOSY
40.0000 mg | PREFILLED_SYRINGE | INTRAMUSCULAR | Status: DC
  Administered 2024-04-13 – 2024-04-14 (×2): 40 mg via SUBCUTANEOUS
  Filled 2024-04-13 (×2): qty 0.4

## 2024-04-13 MED ORDER — POTASSIUM CHLORIDE 10 MEQ/100ML IV SOLN
10.0000 meq | INTRAVENOUS | Status: AC
  Administered 2024-04-13 (×4): 10 meq via INTRAVENOUS
  Filled 2024-04-13 (×4): qty 100

## 2024-04-13 MED ORDER — POTASSIUM CHLORIDE IN NACL 20-0.9 MEQ/L-% IV SOLN
INTRAVENOUS | Status: DC
  Filled 2024-04-13: qty 1000

## 2024-04-13 MED ORDER — NICOTINE 7 MG/24HR TD PT24
7.0000 mg | MEDICATED_PATCH | Freq: Once | TRANSDERMAL | Status: AC
  Administered 2024-04-13: 7 mg via TRANSDERMAL
  Filled 2024-04-13: qty 1

## 2024-04-13 MED ORDER — PERFLUTREN LIPID MICROSPHERE
1.0000 mL | INTRAVENOUS | Status: AC | PRN
  Administered 2024-04-13: 3 mL via INTRAVENOUS

## 2024-04-13 NOTE — Progress Notes (Signed)
 "  Progress Note     Subjective: Patient feels that he has improved overall. Having bowel movements and flatulence. Denies n/v. Having abdominal soreness but denies significant or worsening pain.   ROS  All negative with the exception of above.  Objective: Vital signs in last 24 hours: Temp:  [97.5 F (36.4 C)-98.2 F (36.8 C)] 97.8 F (36.6 C) (12/29 0440) Pulse Rate:  [56-77] 56 (12/29 0440) Resp:  [16-18] 16 (12/29 0440) BP: (115-144)/(41-80) 116/80 (12/29 0440) SpO2:  [94 %-100 %] 98 % (12/29 0440) Last BM Date : 04/13/24  Intake/Output from previous day: 12/28 0701 - 12/29 0700 In: 0  Out: 1100 [Emesis/NG output:1100] Intake/Output this shift: No intake/output data recorded.  PE: General: Pleasant male who is laying in bed in NAD. HEENT: Head is normocephalic, atraumatic.  Heart: HR normal during encounter.  Lungs: Respiratory effort nonlabored. Abd: Soft, ND. Generalized tenderness to palpation. No rebound tenderness or guarding.  MS: Able to move all extremities.  Skin: Warm and dry.  Psych: A&Ox3 with an appropriate affect.    Lab Results:  Recent Labs    04/12/24 0313 04/13/24 0713  WBC 9.5 7.6  HGB 11.6* 11.3*  HCT 34.3* 32.9*  PLT 194 186   BMET Recent Labs    04/12/24 0313 04/13/24 0713  NA 140 136  K 3.7 3.3*  CL 108 105  CO2 23 21*  GLUCOSE 86 71  BUN 10 15  CREATININE 0.78 0.64  CALCIUM  8.1* 8.3*   PT/INR No results for input(s): LABPROT, INR in the last 72 hours. CMP     Component Value Date/Time   NA 136 04/13/2024 0713   NA 143 11/07/2016 1340   K 3.3 (L) 04/13/2024 0713   K 4.1 11/07/2016 1340   CL 105 04/13/2024 0713   CO2 21 (L) 04/13/2024 0713   CO2 30 (H) 11/07/2016 1340   GLUCOSE 71 04/13/2024 0713   GLUCOSE 87 11/07/2016 1340   BUN 15 04/13/2024 0713   BUN 3.2 (L) 11/07/2016 1340   CREATININE 0.64 04/13/2024 0713   CREATININE 1.0 11/07/2016 1340   CALCIUM  8.3 (L) 04/13/2024 0713   CALCIUM  10.1 11/07/2016  1340   PROT 5.5 (L) 04/12/2024 0313   PROT 7.9 11/07/2016 1340   ALBUMIN 3.2 (L) 04/12/2024 0313   ALBUMIN 4.5 11/07/2016 1340   AST 21 04/12/2024 0313   AST 31 11/07/2016 1340   ALT 9 04/12/2024 0313   ALT 30 11/07/2016 1340   ALKPHOS 76 04/12/2024 0313   ALKPHOS 98 11/07/2016 1340   BILITOT 0.3 04/12/2024 0313   BILITOT 0.45 11/07/2016 1340   GFRNONAA >60 04/13/2024 0713   GFRAA >60 06/12/2016 0544   Lipase     Component Value Date/Time   LIPASE 16 04/11/2024 1530       Studies/Results: DG Abd Portable 1V-Small Bowel Obstruction Protocol-initial, 8 hr delay Result Date: 04/12/2024 EXAM: 1 VIEW XRAY OF THE ABDOMEN 04/12/2024 08:02:00 PM COMPARISON: 04/11/2024 CLINICAL HISTORY: FINDINGS: LINES, TUBES AND DEVICES: Gastric tube tip noted overlying the gastric lumen. BOWEL: Nonobstructive bowel gas pattern. Small volume of retained oral contrast in the distal small bowel and proximal colon, similar volume to yesterday, but decreased. Normal abdominal gas pattern. SOFT TISSUES: No abnormal calcifications. BONES: No acute fracture. Spinal fusion hardware noted. IMPRESSION: 1. Small volume of retained oral contrast in the distal small bowel and proximal colon, decreased. 2. Gastric tube tip overlies the gastric lumen. Electronically signed by: Dorethia Molt MD 04/12/2024 08:41 PM EST  RP Workstation: [id number]   VAS US  LOWER EXTREMITY VENOUS (DVT) Result Date: 04/12/2024  Lower Venous DVT Study Patient Name:  [name]  Date of Exam:   04/12/2024 Medical Rec #: [id number]          Accession #:    [id number] Date of Birth: February 06, 1966         Patient Gender: M Patient Age:   58 years Exam Location:  [facility] Procedure:      VAS US  LOWER EXTREMITY VENOUS (DVT) Referring Phys: TIMOTHY OPYD --------------------------------------------------------------------------------  Indications: Swelling, and Edema.  Comparison Study: No prior exam. Performing Technologist: Edilia Elden Appl  Examination Guidelines: A complete evaluation includes B-mode imaging, spectral Doppler, color Doppler, and power Doppler as needed of all accessible portions of each vessel. Bilateral testing is considered an integral part of a complete examination. Limited examinations for reoccurring indications may be performed as noted. The reflux portion of the exam is performed with the patient in reverse Trendelenburg.  +---------+---------------+---------+-----------+----------+--------------+ RIGHT    CompressibilityPhasicitySpontaneityPropertiesThrombus Aging +---------+---------------+---------+-----------+----------+--------------+ CFV      Full           Yes      Yes                                 +---------+---------------+---------+-----------+----------+--------------+ SFJ      Full           Yes      Yes                                 +---------+---------------+---------+-----------+----------+--------------+ FV Prox  Full                                                        +---------+---------------+---------+-----------+----------+--------------+ FV Mid   Full                                                        +---------+---------------+---------+-----------+----------+--------------+ FV DistalFull                                                        +---------+---------------+---------+-----------+----------+--------------+ PFV      Full                                                        +---------+---------------+---------+-----------+----------+--------------+ POP      Full           Yes      Yes                                 +---------+---------------+---------+-----------+----------+--------------+ PTV  Full                                                        +---------+---------------+---------+-----------+----------+--------------+ PERO     Full                                                         +---------+---------------+---------+-----------+----------+--------------+   +---------+---------------+---------+-----------+----------+--------------+ LEFT     CompressibilityPhasicitySpontaneityPropertiesThrombus Aging +---------+---------------+---------+-----------+----------+--------------+ CFV      Full           Yes      Yes                                 +---------+---------------+---------+-----------+----------+--------------+ SFJ      Full           Yes      Yes                                 +---------+---------------+---------+-----------+----------+--------------+ FV Prox  Full                                                        +---------+---------------+---------+-----------+----------+--------------+ FV Mid   Full                                                        +---------+---------------+---------+-----------+----------+--------------+ FV DistalFull                                                        +---------+---------------+---------+-----------+----------+--------------+ PFV      Full                                                        +---------+---------------+---------+-----------+----------+--------------+ POP      Full           Yes      Yes                                 +---------+---------------+---------+-----------+----------+--------------+ PTV      Full                                                        +---------+---------------+---------+-----------+----------+--------------+  PERO     Full                                                        +---------+---------------+---------+-----------+----------+--------------+     Summary: BILATERAL: - No evidence of deep vein thrombosis seen in the lower extremities, bilaterally. -No evidence of popliteal cyst, bilaterally.   *See table(s) above for measurements and observations.    Preliminary    DG Abd Portable 1 View Result Date:  04/11/2024 EXAM: 1 VIEW XRAY OF THE ABDOMEN 04/11/2024 09:53:29 PM COMPARISON: CT abdomen and pelvis with contrast today at 6:17 pm. CLINICAL HISTORY: post ng placement FINDINGS: Only the upper abdomen was included in the film. LINES, TUBES AND DEVICES: Nasogastric tube (NGT) has been inserted, extending to the left within the stomach with the tip in the fundal/proximal body of stomach area, adequately inserted. BOWEL: Small bowel dilatation is not as well seen as on CT but is probably unchanged. SOFT TISSUES: There is contrast in the normal caliber renal collecting systems and proximal ureters. BONES: Left lateral side plate fusion hardware is again noted in the lower thoracic spine. No acute fracture. IMPRESSION: 1. NGT in appropriate position. 2. Small bowel dilatation not as well seen as on CT although probably unchanged. Electronically signed by: Francis Quam MD 04/11/2024 10:48 PM EST RP Workstation: [id number]   CT ABDOMEN PELVIS W CONTRAST Result Date: 04/11/2024 EXAM: CT ABDOMEN AND PELVIS WITH CONTRAST 04/11/2024 06:32:00 PM TECHNIQUE: CT of the abdomen and pelvis was performed with the administration of 75 mL of iohexol  (OMNIPAQUE ) 350 MG/ML injection. Multiplanar reformatted images are provided for review. Automated exposure control, iterative reconstruction, and/or weight-based adjustment of the mA/kV was utilized to reduce the radiation dose to as low as reasonably achievable. COMPARISON: CT abdomen and pelvis 12/30/2022. CLINICAL HISTORY: Abdominal pain, acute, nonlocalized. FINDINGS: LOWER CHEST: There is scarring in the left lung base. LIVER: Hypodense lesion in the left lobe of the liver is unchanged measuring 16 mm. This was previously characterized as hemangioma. Subcentimeter hypodensity in the right lobe of the liver is also unchanged, likely hemangioma or cyst. No new liver lesions are identified. GALLBLADDER AND BILE DUCTS: The gallbladder is within normal limits. No biliary ductal  dilatation. SPLEEN: No acute abnormality. PANCREAS: No acute abnormality. ADRENAL GLANDS: No acute abnormality. KIDNEYS, URETERS AND BLADDER: Scarring in the superior pole of the right kidney. Punctate calculus in the inferior pole of the right kidney. Left kidney appears normal. The bladder is distended. No hydronephrosis. No perinephric or periureteral stranding. GI AND BOWEL: Stomach is moderately distended. Right hemicolectomy changes are again seen. Ileocolic anastomosis in the right abdomen is unchanged. Short segment of circumferential wall thickening of the ileum just proximal to the anastomosis worrisome for enteritis. This is a transition point for small bowel obstruction. Small bowel loops proximal to this level are dilated and fluid filled. The colon is completely decompressed which limits its evaluation. Scattered colonic diverticula. PERITONEUM AND RETROPERITONEUM: Trace free fluid in the pelvis. No free air. VASCULATURE: Aorta is normal in caliber. Atherosclerotic calcifications of the aorta and iliac arteries. LYMPH NODES: Prominent bilateral inguinal lymph nodes. REPRODUCTIVE ORGANS: No acute abnormality. BONES AND SOFT TISSUES: Surgical hardware is seen at L3-L4, L4-L5, and in the lower thoracic region. No acute osseous abnormality. No focal soft tissue abnormality. IMPRESSION: 1.  Small bowel obstruction with transition point at level of focal ileitis just proximal to ileocolic anastomosis. 2. Moderately distended stomach. 3. Trace free fluid in the pelvis. 4. Prominent bilateral inguinal lymph nodes, nonspecific. Electronically signed by: Greig Pique MD 04/11/2024 08:05 PM EST RP Workstation: [id number]    Anti-infectives: Anti-infectives (From admission, onward)    None        Assessment/Plan SBO - CT w/ Small bowel obstruction with transition point at level of focal ileitis just proximal to ileocolic anastomosis. 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 He has been admitted for SBO in the past but has not required operative intervention (most recently Sept 2024).  - SBO protocol completed. 8 hr delay film reports retained oral contrast in distal small bowel and proximal colon.  - NGT output 1100 mL from 12/28-12/29. - Afebrile. - Cont NPO, NGT to LIWS. Will discuss with attending possible clamp trial today as patient has had improvement in symptoms. Having bowel function, but has high NGT output.  - Keep K >=4, Phos >= 3, Mg >= 2 and mobilize for bowel function. Okay to clamp NGT for mobilization - Hopefully patient will improve with conservative management. If patient fails to improve with conservative management, they may require exploratory surgery during admission. - Will continue to follow.    FEN - NPO, NGT to LIWS. IVF per primary team. VTE - SCDs. ID - None    LOS: 2 days   I reviewed nursing notes, specialist notes, hospitalist notes, last 24 h vitals and pain scores, last 48 h intake and output, last 24 h labs and trends, and last 24 h imaging results.  This care required moderate level of medical decision making.    Marjorie Carlyon Favre, [facility] 04/13/2024, 8:44 AM Please see Amion for pager number during day hours 7:00am-4:30pm  "

## 2024-04-13 NOTE — Progress Notes (Signed)
 " PROGRESS NOTE    [name]  FMW:[id number] DOB: 1966-02-17 DOA: 04/11/2024 PCP: [facility], [facility]    Brief Narrative:  [name] is a 58 y.o. 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    Assessment and Plan: SBO  - Appreciate surgery consultation  - Continue bowel rest, NGT decompression, IVF hydration, monitor/correct electrolytes      Leg swelling - Patient developed b/l LE pitting edema, L>R, over the past 1 month  - Creatinine and albumin are normal and there is no protein on UA; hyperpigmentation in gaiter distribution is noted and suggestive of chronic venous stasis  - Check LE venous Doppler and echocardiogram    Chronic pain  - Treating acute pain for now    Hypokalemia - Replete  DVT prophylaxis: enoxaparin  (LOVENOX ) injection 40 mg Start: 04/13/24 1330 SCDs Start: 04/11/24 2056    Code Status: Full Code Family Communication:   Disposition Plan:  Level of care: Med-Surg Status is: Inpatient     Consultants:  GS   Subjective: Overall feeling better  Objective: Vitals:   04/13/24 0114 04/13/24 0440 04/13/24 1026 04/13/24 1300  BP: (!) 144/62 116/80 134/77   Pulse: 64 (!) 56 60   Resp: 16 16 17    Temp: 98.2 F (36.8 C) 97.8 F (36.6 C) (!) 97.5 F (36.4 C)   TempSrc: Oral Oral Oral   SpO2: 100% 98% 99%   Weight:    75.2 kg  Height:        Intake/Output Summary (Last 24 hours) at 04/13/2024 1348 Last data filed at 04/13/2024 1200 Gross per 24 hour  Intake 0 ml  Output 1200 ml  Net -1200 ml   Filed Weights   04/13/24 1300  Weight: 75.2 kg    Examination:   General: Appearance:    Well developed, well nourished male in no acute distress     Lungs:      respirations unlabored  Heart:    Normal heart rate.    MS:   All extremities are intact.    Neurologic:   Awake, alert       Data Reviewed: I have personally reviewed following labs and imaging studies  CBC: Recent Labs  Lab 04/11/24 1530 04/12/24 0313 04/13/24 0713  WBC 17.3* 9.5 7.6  HGB 13.5 11.6* 11.3*  HCT 40.0 34.3* 32.9*  MCV 93.5 94.5 91.9  PLT 228 194 186   Basic Metabolic Panel: Recent Labs  Lab 04/11/24 1530 04/12/24 0313 04/13/24 0713  NA 139 140 136  K 3.6 3.7 3.3*  CL 102 108 105  CO2 27 23 21*  GLUCOSE 107* 86 71  BUN 9 10 15   CREATININE 0.78 0.78 0.64  CALCIUM  9.2 8.1* 8.3*  MG  --  2.0  --    GFR: Estimated Creatinine Clearance: 107.1 mL/min (by C-G formula based on SCr of 0.64 mg/dL). Liver Function Tests: Recent Labs  Lab 04/11/24 1530 04/12/24 0313  AST 27 21  ALT 13 9  ALKPHOS 98 76  BILITOT 0.3 0.3  PROT 6.9 5.5*  ALBUMIN 4.0 3.2*   Recent Labs  Lab 04/11/24 1530  LIPASE 16   No results for input(s): AMMONIA in the last 168 hours. Coagulation Profile: No results for input(s): INR, PROTIME in  the last 168 hours. Cardiac Enzymes: No results for input(s): CKTOTAL, CKMB, CKMBINDEX, TROPONINI in the last 168 hours. BNP (last 3 results) No results for input(s): PROBNP in the last 8760 hours. HbA1C: No results for input(s): HGBA1C in the last 72 hours. CBG: No results for input(s): GLUCAP in the last 168 hours. Lipid Profile: No results for input(s): CHOL, HDL, LDLCALC, TRIG, CHOLHDL, LDLDIRECT in the last 72 hours. Thyroid Function Tests: No results for input(s): TSH, T4TOTAL, FREET4, T3FREE, THYROIDAB in the last 72 hours. Anemia Panel: No results for input(s): VITAMINB12, FOLATE, FERRITIN, TIBC, IRON, RETICCTPCT in the last 72 hours. Sepsis Labs: No results for input(s): PROCALCITON, LATICACIDVEN in the last 168 hours.  No results found for this or any previous  visit (from the past 240 hours).       Radiology Studies: VAS US  LOWER EXTREMITY VENOUS (DVT) Result Date: 04/13/2024  Lower Venous DVT Study Patient Name:  [name]  Date of Exam:   04/12/2024 Medical Rec #: [id number]          Accession #:    [id number] Date of Birth: 1965-08-07         Patient Gender: M Patient Age:   47 years Exam Location:  [facility] Procedure:      VAS US  LOWER EXTREMITY VENOUS (DVT) Referring Phys: TIMOTHY OPYD --------------------------------------------------------------------------------  Indications: Swelling, and Edema.  Comparison Study: No prior exam. Performing Technologist: Edilia Elden Appl  Examination Guidelines: A complete evaluation includes B-mode imaging, spectral Doppler, color Doppler, and power Doppler as needed of all accessible portions of each vessel. Bilateral testing is considered an integral part of a complete examination. Limited examinations for reoccurring indications may be performed as noted. The reflux portion of the exam is performed with the patient in reverse Trendelenburg.  +---------+---------------+---------+-----------+----------+--------------+ RIGHT    CompressibilityPhasicitySpontaneityPropertiesThrombus Aging +---------+---------------+---------+-----------+----------+--------------+ CFV      Full           Yes      Yes                                 +---------+---------------+---------+-----------+----------+--------------+ SFJ      Full           Yes      Yes                                 +---------+---------------+---------+-----------+----------+--------------+ FV Prox  Full                                                        +---------+---------------+---------+-----------+----------+--------------+ FV Mid   Full                                                        +---------+---------------+---------+-----------+----------+--------------+ FV DistalFull                                                         +---------+---------------+---------+-----------+----------+--------------+  PFV      Full                                                        +---------+---------------+---------+-----------+----------+--------------+ POP      Full           Yes      Yes                                 +---------+---------------+---------+-----------+----------+--------------+ PTV      Full                                                        +---------+---------------+---------+-----------+----------+--------------+ PERO     Full                                                        +---------+---------------+---------+-----------+----------+--------------+   +---------+---------------+---------+-----------+----------+--------------+ LEFT     CompressibilityPhasicitySpontaneityPropertiesThrombus Aging +---------+---------------+---------+-----------+----------+--------------+ CFV      Full           Yes      Yes                                 +---------+---------------+---------+-----------+----------+--------------+ SFJ      Full           Yes      Yes                                 +---------+---------------+---------+-----------+----------+--------------+ FV Prox  Full                                                        +---------+---------------+---------+-----------+----------+--------------+ FV Mid   Full                                                        +---------+---------------+---------+-----------+----------+--------------+ FV DistalFull                                                        +---------+---------------+---------+-----------+----------+--------------+ PFV      Full                                                        +---------+---------------+---------+-----------+----------+--------------+  POP      Full           Yes      Yes                                  +---------+---------------+---------+-----------+----------+--------------+ PTV      Full                                                        +---------+---------------+---------+-----------+----------+--------------+ PERO     Full                                                        +---------+---------------+---------+-----------+----------+--------------+     Summary: BILATERAL: - No evidence of deep vein thrombosis seen in the lower extremities, bilaterally. -No evidence of popliteal cyst, bilaterally.   *See table(s) above for measurements and observations. Electronically signed by Fonda Rim on 04/13/2024 at 9:43:39 AM.    Final    DG Abd Portable 1V-Small Bowel Obstruction Protocol-initial, 8 hr delay Result Date: 04/12/2024 EXAM: 1 VIEW XRAY OF THE ABDOMEN 04/12/2024 08:02:00 PM COMPARISON: 04/11/2024 CLINICAL HISTORY: FINDINGS: LINES, TUBES AND DEVICES: Gastric tube tip noted overlying the gastric lumen. BOWEL: Nonobstructive bowel gas pattern. Small volume of retained oral contrast in the distal small bowel and proximal colon, similar volume to yesterday, but decreased. Normal abdominal gas pattern. SOFT TISSUES: No abnormal calcifications. BONES: No acute fracture. Spinal fusion hardware noted. IMPRESSION: 1. Small volume of retained oral contrast in the distal small bowel and proximal colon, decreased. 2. Gastric tube tip overlies the gastric lumen. Electronically signed by: Dorethia Molt MD 04/12/2024 08:41 PM EST RP Workstation: [id number]   DG Abd Portable 1 View Result Date: 04/11/2024 EXAM: 1 VIEW XRAY OF THE ABDOMEN 04/11/2024 09:53:29 PM COMPARISON: CT abdomen and pelvis with contrast today at 6:17 pm. CLINICAL HISTORY: post ng placement FINDINGS: Only the upper abdomen was included in the film. LINES, TUBES AND DEVICES: Nasogastric tube (NGT) has been inserted, extending to the left within the stomach with the tip in the fundal/proximal body of stomach area,  adequately inserted. BOWEL: Small bowel dilatation is not as well seen as on CT but is probably unchanged. SOFT TISSUES: There is contrast in the normal caliber renal collecting systems and proximal ureters. BONES: Left lateral side plate fusion hardware is again noted in the lower thoracic spine. No acute fracture. IMPRESSION: 1. NGT in appropriate position. 2. Small bowel dilatation not as well seen as on CT although probably unchanged. Electronically signed by: Francis Quam MD 04/11/2024 10:48 PM EST RP Workstation: [id number]   CT ABDOMEN PELVIS W CONTRAST Result Date: 04/11/2024 EXAM: CT ABDOMEN AND PELVIS WITH CONTRAST 04/11/2024 06:32:00 PM TECHNIQUE: CT of the abdomen and pelvis was performed with the administration of 75 mL of iohexol  (OMNIPAQUE ) 350 MG/ML injection. Multiplanar reformatted images are provided for review. Automated exposure control, iterative reconstruction, and/or weight-based adjustment of the mA/kV was utilized to reduce the radiation dose to as low as reasonably achievable. COMPARISON: CT abdomen and pelvis 12/30/2022. CLINICAL HISTORY: Abdominal pain, acute, nonlocalized. FINDINGS: LOWER CHEST: There  is scarring in the left lung base. LIVER: Hypodense lesion in the left lobe of the liver is unchanged measuring 16 mm. This was previously characterized as hemangioma. Subcentimeter hypodensity in the right lobe of the liver is also unchanged, likely hemangioma or cyst. No new liver lesions are identified. GALLBLADDER AND BILE DUCTS: The gallbladder is within normal limits. No biliary ductal dilatation. SPLEEN: No acute abnormality. PANCREAS: No acute abnormality. ADRENAL GLANDS: No acute abnormality. KIDNEYS, URETERS AND BLADDER: Scarring in the superior pole of the right kidney. Punctate calculus in the inferior pole of the right kidney. Left kidney appears normal. The bladder is distended. No hydronephrosis. No perinephric or periureteral stranding. GI AND BOWEL: Stomach is  moderately distended. Right hemicolectomy changes are again seen. Ileocolic anastomosis in the right abdomen is unchanged. Short segment of circumferential wall thickening of the ileum just proximal to the anastomosis worrisome for enteritis. This is a transition point for small bowel obstruction. Small bowel loops proximal to this level are dilated and fluid filled. The colon is completely decompressed which limits its evaluation. Scattered colonic diverticula. PERITONEUM AND RETROPERITONEUM: Trace free fluid in the pelvis. No free air. VASCULATURE: Aorta is normal in caliber. Atherosclerotic calcifications of the aorta and iliac arteries. LYMPH NODES: Prominent bilateral inguinal lymph nodes. REPRODUCTIVE ORGANS: No acute abnormality. BONES AND SOFT TISSUES: Surgical hardware is seen at L3-L4, L4-L5, and in the lower thoracic region. No acute osseous abnormality. No focal soft tissue abnormality. IMPRESSION: 1. Small bowel obstruction with transition point at level of focal ileitis just proximal to ileocolic anastomosis. 2. Moderately distended stomach. 3. Trace free fluid in the pelvis. 4. Prominent bilateral inguinal lymph nodes, nonspecific. Electronically signed by: Greig Pique MD 04/11/2024 08:05 PM EST RP Workstation: [id number]        Scheduled Meds:  enoxaparin  (LOVENOX ) injection  40 mg Subcutaneous Q24H   nicotine   7 mg Transdermal Once   pantoprazole  (PROTONIX ) IV  40 mg Intravenous Q24H   Continuous Infusions:  potassium chloride  10 mEq (04/13/24 1237)     LOS: 2 days    Time spent: 45 minutes spent on chart review, discussion with nursing staff, consultants, updating family and interview/physical exam; more than 50% of that time was spent in counseling and/or coordination of care.    Harlene RAYMOND Bowl, DO [facility] Available via Epic secure chat 7am-7pm After these hours, please refer to coverage provider listed on [email or web address] 04/13/2024, 1:48 PM   "

## 2024-04-13 NOTE — Plan of Care (Signed)

## 2024-04-13 NOTE — Progress Notes (Signed)
 NGT has been removed, Per PA orders. Patient advised NGT was going to be removed upon entering room, This nurse then went to put gloves on and while doing that, patient took NGT out himself. Patient tolerated well, no adverse reactions, no bleeding or discharge coming from nose. PA made aware, bed in lowest position, and call light within reach.

## 2024-04-13 NOTE — Plan of Care (Signed)

## 2024-04-13 NOTE — Progress Notes (Signed)
NG tube clamped per order 

## 2024-04-14 LAB — BASIC METABOLIC PANEL WITH GFR
Anion gap: 14 (ref 5–15)
BUN: 13 mg/dL (ref 6–20)
CO2: 19 mmol/L — ABNORMAL LOW (ref 22–32)
Calcium: 8.4 mg/dL — ABNORMAL LOW (ref 8.9–10.3)
Chloride: 101 mmol/L (ref 98–111)
Creatinine, Ser: 0.74 mg/dL (ref 0.61–1.24)
GFR, Estimated: >60 mL/min
Glucose, Bld: 88 mg/dL (ref 70–99)
Potassium: 4 mmol/L (ref 3.5–5.1)
Sodium: 134 mmol/L — ABNORMAL LOW (ref 135–145)

## 2024-04-14 LAB — CBC
HCT: 39 % (ref 39.0–52.0)
Hemoglobin: 13.3 g/dL (ref 13.0–17.0)
MCH: 32.1 pg (ref 26.0–34.0)
MCHC: 34.1 g/dL (ref 30.0–36.0)
MCV: 94.2 fL (ref 80.0–100.0)
Platelets: 247 K/uL (ref 150–400)
RBC: 4.14 MIL/uL — ABNORMAL LOW (ref 4.22–5.81)
RDW: 12.6 % (ref 11.5–15.5)
WBC: 12.5 K/uL — ABNORMAL HIGH (ref 4.0–10.5)
nRBC: 0 % (ref 0.0–0.2)

## 2024-04-14 LAB — GLUCOSE, CAPILLARY
Glucose-Capillary: 40 mg/dL — CL (ref 70–99)
Glucose-Capillary: 171 mg/dL — ABNORMAL HIGH (ref 70–99)

## 2024-04-14 MED ORDER — MELATONIN 5 MG PO TABS: 5.0000 mg | ORAL_TABLET | Freq: Every evening | ORAL | Status: DC | PRN

## 2024-04-14 MED ORDER — MORPHINE SULFATE 15 MG PO TABS
15.0000 mg | ORAL_TABLET | Freq: Three times a day (TID) | ORAL | Status: DC | PRN
  Administered 2024-04-14 – 2024-04-15 (×2): 15 mg via ORAL
  Filled 2024-04-14 (×2): qty 1

## 2024-04-14 MED ORDER — NICOTINE 7 MG/24HR TD PT24
7.0000 mg | MEDICATED_PATCH | Freq: Once | TRANSDERMAL | Status: DC
  Administered 2024-04-14: 7 mg via TRANSDERMAL
  Filled 2024-04-14: qty 1

## 2024-04-14 NOTE — Progress Notes (Signed)
 Hypoglycemic Event 04/14/24 0741  CBG: 40  Treatment: 8 oz juice/soda  Symptoms: None  Follow-up CBG: Time:0901 CBG Result:171  Possible Reasons for Event: Inadequate meal intake NPO  Comments/MD notified: Night RN was notified of critical low blood test, rechecked via CBG and it was 40. MD was notified and ordered to give juice. Surgery team was notified as well and will update diet. Pt remains asymptomatic the entire time.    Jeyda Siebel G Knoah Nedeau

## 2024-04-14 NOTE — Progress Notes (Signed)
 "  Progress Note     Subjective: Patient feels that pain is better overall. He has tolerated CLD without nausea and vomiting. Having bowel movements and flatulence.   ROS  All negative with the exception of above.  Objective: Vital signs in last 24 hours: Temp:  [97.4 F (36.3 C)-98.1 F (36.7 C)] 98.1 F (36.7 C) (12/30 0526) Pulse Rate:  [56-60] 57 (12/30 0526) Resp:  [17] 17 (12/30 0526) BP: (103-134)/(58-77) 112/58 (12/30 0526) SpO2:  [97 %-100 %] 100 % (12/30 0526) Weight:  [75.2 kg] 75.2 kg (12/29 1300) Last BM Date : 04/13/24  Intake/Output from previous day: 12/29 0701 - 12/30 0700 In: 1003.9 [P.O.:240; I.V.:763.9] Out: 100 [Emesis/NG output:100] Intake/Output this shift: No intake/output data recorded.  PE: General: Pleasant male who is laying in bed in NAD. HEENT: Head is normocephalic, atraumatic.  Heart: HR normal during encounter.  Lungs: Respiratory effort nonlabored. Abd: Soft, ND. Mild generalized tenderness to palpation (improved from yesterday). No rebound tenderness or guarding.  MS: Able to move all extremities.  Skin: Warm and dry.  Psych: A&Ox3 with an appropriate affect.    Lab Results:  Recent Labs    04/13/24 0713 04/14/24 0527  WBC 7.6 12.5*  HGB 11.3* 13.3  HCT 32.9* 39.0  PLT 186 247   BMET Recent Labs    04/12/24 0313 04/13/24 0713  NA 140 136  K 3.7 3.3*  CL 108 105  CO2 23 21*  GLUCOSE 86 71  BUN 10 15  CREATININE 0.78 0.64  CALCIUM  8.1* 8.3*   PT/INR No results for input(s): LABPROT, INR in the last 72 hours. CMP     Component Value Date/Time   NA 136 04/13/2024 0713   NA 143 11/07/2016 1340   K 3.3 (L) 04/13/2024 0713   K 4.1 11/07/2016 1340   CL 105 04/13/2024 0713   CO2 21 (L) 04/13/2024 0713   CO2 30 (H) 11/07/2016 1340   GLUCOSE 71 04/13/2024 0713   GLUCOSE 87 11/07/2016 1340   BUN 15 04/13/2024 0713   BUN 3.2 (L) 11/07/2016 1340   CREATININE 0.64 04/13/2024 0713   CREATININE 1.0 11/07/2016 1340    CALCIUM  8.3 (L) 04/13/2024 0713   CALCIUM  10.1 11/07/2016 1340   PROT 5.5 (L) 04/12/2024 0313   PROT 7.9 11/07/2016 1340   ALBUMIN 3.2 (L) 04/12/2024 0313   ALBUMIN 4.5 11/07/2016 1340   AST 21 04/12/2024 0313   AST 31 11/07/2016 1340   ALT 9 04/12/2024 0313   ALT 30 11/07/2016 1340   ALKPHOS 76 04/12/2024 0313   ALKPHOS 98 11/07/2016 1340   BILITOT 0.3 04/12/2024 0313   BILITOT 0.45 11/07/2016 1340   GFRNONAA >60 04/13/2024 0713   GFRAA >60 06/12/2016 0544   Lipase     Component Value Date/Time   LIPASE 16 04/11/2024 1530       Studies/Results: ECHOCARDIOGRAM COMPLETE Result Date: 04/13/2024    ECHOCARDIOGRAM REPORT   Patient Name:   [name] Date of Exam: 04/13/2024 Medical Rec #:  [id number]         Height:       71.0 in Accession #:    [id number]        Weight:       165.8 lb Date of Birth:  [birth year]        BSA:          1.947 m Patient Age:    58 years  BP:           116/80 mmHg Patient Gender: M                 HR:           55 bpm. Exam Location:  Inpatient Procedure: 2D Echo, Cardiac Doppler, Color Doppler and Intracardiac            Opacification Agent (Both Spectral and Color Flow Doppler were            utilized during procedure). Indications:    CHF - Acute Diastolic  History:        Patient has no prior history of Echocardiogram examinations.  Sonographer:    Sherlean Dubin Sonographer#2:  Carmelita Hartshorn RDCS, FE, PE Referring Phys: [id number] TIMOTHY S OPYD  Sonographer Comments: Suboptimal apical window and suboptimal parasternal window. Image acquisition challenging due to patient body habitus and Image acquisition challenging due to respiratory motion. Aortic Valve assessd from subcostal window. IMPRESSIONS  1. Left ventricular ejection fraction, by estimation, is 55 to 60%. The left ventricle has normal function. Left ventricular endocardial border not optimally defined to evaluate regional wall motion. Left ventricular diastolic function could not be  evaluated.  2. Right ventricular systolic function is normal. The right ventricular size is normal.  3. The mitral valve is grossly normal. Trivial mitral valve regurgitation.  4. The aortic valve is grossly normal. Aortic valve regurgitation is not visualized. FINDINGS  Left Ventricle: LV grossly normal, extremely limited study even with the addition of definity . Left ventricular ejection fraction, by estimation, is 55 to 60%. The left ventricle has normal function. Left ventricular endocardial border not optimally defined to evaluate regional wall motion. Definity  contrast agent was given IV to delineate the left ventricular endocardial borders. The left ventricular internal cavity size was normal in size. There is no left ventricular hypertrophy. Left ventricular  diastolic function could not be evaluated due to nondiagnostic images. Left ventricular diastolic function could not be evaluated. Right Ventricle: The right ventricular size is normal. Right vetricular wall thickness was not well visualized. Right ventricular systolic function is normal. Left Atrium: Left atrial size was normal in size. Right Atrium: Right atrial size was normal in size. Pericardium: There is no evidence of pericardial effusion. Mitral Valve: The mitral valve is grossly normal. Mild mitral annular calcification. Trivial mitral valve regurgitation. Tricuspid Valve: The tricuspid valve is grossly normal. Tricuspid valve regurgitation is not demonstrated. Aortic Valve: The aortic valve is grossly normal. Aortic valve regurgitation is not visualized. Pulmonic Valve: The pulmonic valve was not well visualized. Aorta: The aortic root is normal in size and structure. IAS/Shunts: The interatrial septum was not well visualized.  LEFT VENTRICLE PLAX 2D LVIDd:         4.20 cm   Diastology LVIDs:         3.10 cm   LV e' medial:    12.40 cm/s LV PW:         0.70 cm   LV E/e' medial:  4.5 LV IVS:        0.80 cm   LV e' lateral:   12.50 cm/s LVOT  diam:     2.10 cm   LV E/e' lateral: 4.4 LV SV:         62 LV SV Index:   32 LVOT Area:     3.46 cm  RIGHT VENTRICLE RV S prime:     11.90 cm/s TAPSE (M-mode): 1.5 cm LEFT ATRIUM  Index        RIGHT ATRIUM           Index LA diam:        2.90 cm 1.49 cm/m   RA Area:     12.70 cm LA Vol (A2C):   41.3 ml 21.21 ml/m  RA Volume:   30.60 ml  15.72 ml/m LA Vol (A4C):   41.5 ml 21.32 ml/m LA Biplane Vol: 42.9 ml 22.03 ml/m  AORTIC VALVE LVOT Vmax:   85.00 cm/s LVOT Vmean:  55.300 cm/s LVOT VTI:    0.180 m  AORTA Ao Root diam: 2.90 cm Ao Asc diam:  2.60 cm MITRAL VALVE MV Area (PHT): 2.13 cm    SHUNTS MV Decel Time: 356 msec    Systemic VTI:  0.18 m MV E velocity: 55.50 cm/s  Systemic Diam: 2.10 cm Morene Brownie Electronically signed by Morene Brownie Signature Date/Time: 04/13/2024/8:46:24 PM    Final    DG Abd 1 View Result Date: 04/13/2024 CLINICAL DATA:  Small bowel obstruction. EXAM: DG ABDOMEN 1V COMPARISON:  Radiograph yesterday FINDINGS: There is enteric contrast within the transverse, descending, and sigmoid colon. A few prominent air-filled loops of small bowel persist in the central abdomen. Enteric tube tip and side port remain below the diaphragm in the stomach. IMPRESSION: Enteric contrast within the transverse, descending, and sigmoid colon. A few prominent air-filled loops of small bowel persist in the central abdomen. Electronically Signed   By: Andrea Gasman M.D.   On: 04/13/2024 13:47   VAS US  LOWER EXTREMITY VENOUS (DVT) Result Date: 04/13/2024  Lower Venous DVT Study Patient Name:  [name]  Date of Exam:   04/12/2024 Medical Rec #: [id number]          Accession #:    [id number] Date of Birth: 12-24-1965         Patient Gender: M Patient Age:   28 years Exam Location:  [facility] Procedure:      VAS US  LOWER EXTREMITY VENOUS (DVT) Referring Phys: TIMOTHY OPYD --------------------------------------------------------------------------------  Indications:  Swelling, and Edema.  Comparison Study: No prior exam. Performing Technologist: Edilia Elden Appl  Examination Guidelines: A complete evaluation includes B-mode imaging, spectral Doppler, color Doppler, and power Doppler as needed of all accessible portions of each vessel. Bilateral testing is considered an integral part of a complete examination. Limited examinations for reoccurring indications may be performed as noted. The reflux portion of the exam is performed with the patient in reverse Trendelenburg.  +---------+---------------+---------+-----------+----------+--------------+ RIGHT    CompressibilityPhasicitySpontaneityPropertiesThrombus Aging +---------+---------------+---------+-----------+----------+--------------+ CFV      Full           Yes      Yes                                 +---------+---------------+---------+-----------+----------+--------------+ SFJ      Full           Yes      Yes                                 +---------+---------------+---------+-----------+----------+--------------+ FV Prox  Full                                                        +---------+---------------+---------+-----------+----------+--------------+  FV Mid   Full                                                        +---------+---------------+---------+-----------+----------+--------------+ FV DistalFull                                                        +---------+---------------+---------+-----------+----------+--------------+ PFV      Full                                                        +---------+---------------+---------+-----------+----------+--------------+ POP      Full           Yes      Yes                                 +---------+---------------+---------+-----------+----------+--------------+ PTV      Full                                                         +---------+---------------+---------+-----------+----------+--------------+ PERO     Full                                                        +---------+---------------+---------+-----------+----------+--------------+   +---------+---------------+---------+-----------+----------+--------------+ LEFT     CompressibilityPhasicitySpontaneityPropertiesThrombus Aging +---------+---------------+---------+-----------+----------+--------------+ CFV      Full           Yes      Yes                                 +---------+---------------+---------+-----------+----------+--------------+ SFJ      Full           Yes      Yes                                 +---------+---------------+---------+-----------+----------+--------------+ FV Prox  Full                                                        +---------+---------------+---------+-----------+----------+--------------+ FV Mid   Full                                                        +---------+---------------+---------+-----------+----------+--------------+  FV DistalFull                                                        +---------+---------------+---------+-----------+----------+--------------+ PFV      Full                                                        +---------+---------------+---------+-----------+----------+--------------+ POP      Full           Yes      Yes                                 +---------+---------------+---------+-----------+----------+--------------+ PTV      Full                                                        +---------+---------------+---------+-----------+----------+--------------+ PERO     Full                                                        +---------+---------------+---------+-----------+----------+--------------+     Summary: BILATERAL: - No evidence of deep vein thrombosis seen in the lower extremities, bilaterally. -No evidence of  popliteal cyst, bilaterally.   *See table(s) above for measurements and observations. Electronically signed by Fonda Rim on 04/13/2024 at 9:43:39 AM.    Final    DG Abd Portable 1V-Small Bowel Obstruction Protocol-initial, 8 hr delay Result Date: 04/12/2024 EXAM: 1 VIEW XRAY OF THE ABDOMEN 04/12/2024 08:02:00 PM COMPARISON: 04/11/2024 CLINICAL HISTORY: FINDINGS: LINES, TUBES AND DEVICES: Gastric tube tip noted overlying the gastric lumen. BOWEL: Nonobstructive bowel gas pattern. Small volume of retained oral contrast in the distal small bowel and proximal colon, similar volume to yesterday, but decreased. Normal abdominal gas pattern. SOFT TISSUES: No abnormal calcifications. BONES: No acute fracture. Spinal fusion hardware noted. IMPRESSION: 1. Small volume of retained oral contrast in the distal small bowel and proximal colon, decreased. 2. Gastric tube tip overlies the gastric lumen. Electronically signed by: Dorethia Molt MD 04/12/2024 08:41 PM EST RP Workstation: [id number]    Anti-infectives: Anti-infectives (From admission, onward)    None        Assessment/Plan SBO - CT w/ Small bowel obstruction with transition point at level of focal ileitis just proximal to ileocolic anastomosis. 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 He has been admitted for SBO in the past but has not required operative intervention (most recently Sept 2024).  - SBO protocol completed. 8 hr delay film reports retained oral contrast in distal small bowel and proximal colon.  - NGT discontinued 12/29. - Afebrile. - Tolerating CLD without nausea and vomiting. Having BM and flatulence. Can advance diet as tolerated. Communicated this  to hospitalist.  - General surgery will sign off at this time. Please call for further questions or concerns.    FEN - CLD; Can advance diet as tolerated. IVF per primary  team. VTE - SCDs, Lovenox . ID - None   LOS: 3 days   I reviewed specialist notes, hospitalist notes, nursing notes, last 24 h vitals and pain scores, last 48 h intake and output, last 24 h labs and trends, and last 24 h imaging results.  This care required moderate level of medical decision making.   Marjorie Carlyon Favre, [facility] Surgery 04/14/2024, 8:52 AM Please see Amion for pager number during day hours 7:00am-4:30pm  "

## 2024-04-14 NOTE — Plan of Care (Signed)
  Problem: Pain Managment: Goal: General experience of comfort will improve and/or be controlled Outcome: Progressing   Problem: Safety: Goal: Ability to remain free from injury will improve Outcome: Progressing   Problem: Skin Integrity: Goal: Risk for impaired skin integrity will decrease Outcome: Progressing

## 2024-04-14 NOTE — Care Management Important Message (Signed)
 Important Message  Patient Details  Name: DONOLD MAROTTO III MRN: [id number] Date of Birth: [birth year]   Important Message Given:  Yes - Medicare IM     Jennie Laneta Dragon 04/14/2024, 1:10 PM

## 2024-04-14 NOTE — Progress Notes (Signed)
 " PROGRESS NOTE    [name]  FMW:[id number] DOB: 1965-10-22 DOA: 04/11/2024 PCP: [facility], [facility]    Brief Narrative:  [name] is a 58 y.o. 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  NG tube was placed with improvement of symptoms.  NG tube has since been removed advance diet as tolerated.  Tolerated clears this a.m. as the patient is now on full liquid diet   Assessment and Plan: SBO  - Appreciate surgery consultation  - NG tube removed - Advancing diet as tolerated    Leg swelling - Patient developed b/l LE pitting edema, L>R, over the past 1 month  - Creatinine and albumin are normal and there is no protein on UA; hyperpigmentation in gaiter distribution is noted and suggestive of chronic venous stasis  -  LE venous Doppler and echocardiogram-unrevealing - Swelling resolved per patient   Chronic pain  - Treating acute pain for now    Hypokalemia - Replete  Hypoglycemia - Due to no p.o. intake -Resolved with eating  DVT prophylaxis: enoxaparin  (LOVENOX ) injection 40 mg Start: 04/13/24 1330 SCDs Start: 04/11/24 2056    Code Status: Full Code   Disposition Plan:  Level of care: Med-Surg Status is: Inpatient     Consultants:  GS   Subjective: Tolerated liquid diet this morning  Objective: Vitals:   04/13/24 1726 04/13/24 1956 04/14/24 0526 04/14/24 1015  BP: 117/66 103/71 (!) 112/58 (!) 141/78  Pulse: (!) 56 60 (!) 57 77  Resp: 17 17 17 18   Temp: (!) 97.4 F (36.3 C) 97.9 F (36.6 C) 98.1 F (36.7 C) 98.1 F (36.7 C)  TempSrc:   Oral Oral  SpO2: 100% 97% 100% 99%  Weight:      Height:        Intake/Output Summary (Last 24 hours) at 04/14/2024 1239 Last data filed at  04/14/2024 0900 Gross per 24 hour  Intake 1353.94 ml  Output --  Net 1353.94 ml   Filed Weights   04/13/24 1300  Weight: 75.2 kg    Examination:   General: Appearance:    Well developed, well nourished male in no acute distress     Lungs:     respirations unlabored  Heart:    Normal heart rate.    MS:   All extremities are intact.    Neurologic:   Awake, alert       Data Reviewed: I have personally reviewed following labs and imaging studies  CBC: Recent Labs  Lab 04/11/24 1530 04/12/24 0313 04/13/24 0713 04/14/24 0527  WBC 17.3* 9.5 7.6 12.5*  HGB 13.5 11.6* 11.3* 13.3  HCT 40.0 34.3* 32.9* 39.0  MCV 93.5 94.5 91.9 94.2  PLT 228 194 186 247   Basic Metabolic Panel: Recent Labs  Lab 04/11/24 1530 04/12/24 0313 04/13/24 0713 04/14/24 0815  NA 139 140 136 134*  K 3.6 3.7 3.3* 4.0  CL 102 108 105 101  CO2 27 23 21* 19*  GLUCOSE 107* 86 71 88  BUN 9 10 15 13   CREATININE 0.78 0.78 0.64 0.74  CALCIUM  9.2 8.1* 8.3* 8.4*  MG  --  2.0  --   --    GFR: Estimated Creatinine Clearance: 107.1 mL/min (by C-G formula based on SCr of 0.74  mg/dL). Liver Function Tests: Recent Labs  Lab 04/11/24 1530 04/12/24 0313  AST 27 21  ALT 13 9  ALKPHOS 98 76  BILITOT 0.3 0.3  PROT 6.9 5.5*  ALBUMIN 4.0 3.2*   Recent Labs  Lab 04/11/24 1530  LIPASE 16   No results for input(s): AMMONIA in the last 168 hours. Coagulation Profile: No results for input(s): INR, PROTIME in the last 168 hours. Cardiac Enzymes: No results for input(s): CKTOTAL, CKMB, CKMBINDEX, TROPONINI in the last 168 hours. BNP (last 3 results) No results for input(s): PROBNP in the last 8760 hours. HbA1C: No results for input(s): HGBA1C in the last 72 hours. CBG: Recent Labs  Lab 04/14/24 0741 04/14/24 0901  GLUCAP 40* 171*   Lipid Profile: No results for input(s): CHOL, HDL, LDLCALC, TRIG, CHOLHDL, LDLDIRECT in the last 72 hours. Thyroid Function  Tests: No results for input(s): TSH, T4TOTAL, FREET4, T3FREE, THYROIDAB in the last 72 hours. Anemia Panel: No results for input(s): VITAMINB12, FOLATE, FERRITIN, TIBC, IRON, RETICCTPCT in the last 72 hours. Sepsis Labs: No results for input(s): PROCALCITON, LATICACIDVEN in the last 168 hours.  No results found for this or any previous visit (from the past 240 hours).       Radiology Studies: ECHOCARDIOGRAM COMPLETE Result Date: 04/13/2024    ECHOCARDIOGRAM REPORT   Patient Name:   [name] Schwartz Date of Exam: 04/13/2024 Medical Rec #:  [id number]         Height:       71.0 in Accession #:    [id number]        Weight:       165.8 lb Date of Birth:  Jan 10, 1966        BSA:          1.947 m Patient Age:    58 years          BP:           116/80 mmHg Patient Gender: M                 HR:           55 bpm. Exam Location:  Inpatient Procedure: 2D Echo, Cardiac Doppler, Color Doppler and Intracardiac            Opacification Agent (Both Spectral and Color Flow Doppler were            utilized during procedure). Indications:    CHF - Acute Diastolic  History:        Patient has no prior history of Echocardiogram examinations.  Sonographer:    [name] Sonographer#2:  [name] RDCS, FE, PE Referring Phys: [id number] [name]  Sonographer Comments: Suboptimal apical window and suboptimal parasternal window. Image acquisition challenging due to patient body habitus and Image acquisition challenging due to respiratory motion. Aortic Valve assessd from subcostal window. IMPRESSIONS  1. Left ventricular ejection fraction, by estimation, is 55 to 60%. The left ventricle has normal function. Left ventricular endocardial border not optimally defined to evaluate regional wall motion. Left ventricular diastolic function could not be evaluated.  2. Right ventricular systolic function is normal. The right ventricular size is normal.  3. The mitral valve is grossly normal.  Trivial mitral valve regurgitation.  4. The aortic valve is grossly normal. Aortic valve regurgitation is not visualized. FINDINGS  Left Ventricle: LV grossly normal, extremely limited study even with the addition of definity . Left ventricular ejection fraction, by estimation, is 55 to 60%. The left  ventricle has normal function. Left ventricular endocardial border not optimally defined to evaluate regional wall motion. Definity  contrast agent was given IV to delineate the left ventricular endocardial borders. The left ventricular internal cavity size was normal in size. There is no left ventricular hypertrophy. Left ventricular  diastolic function could not be evaluated due to nondiagnostic images. Left ventricular diastolic function could not be evaluated. Right Ventricle: The right ventricular size is normal. Right vetricular wall thickness was not well visualized. Right ventricular systolic function is normal. Left Atrium: Left atrial size was normal in size. Right Atrium: Right atrial size was normal in size. Pericardium: There is no evidence of pericardial effusion. Mitral Valve: The mitral valve is grossly normal. Mild mitral annular calcification. Trivial mitral valve regurgitation. Tricuspid Valve: The tricuspid valve is grossly normal. Tricuspid valve regurgitation is not demonstrated. Aortic Valve: The aortic valve is grossly normal. Aortic valve regurgitation is not visualized. Pulmonic Valve: The pulmonic valve was not well visualized. Aorta: The aortic root is normal in size and structure. IAS/Shunts: The interatrial septum was not well visualized.  LEFT VENTRICLE PLAX 2D LVIDd:         4.20 cm   Diastology LVIDs:         3.10 cm   LV e' medial:    12.40 cm/s LV PW:         0.70 cm   LV E/e' medial:  4.5 LV IVS:        0.80 cm   LV e' lateral:   12.50 cm/s LVOT diam:     2.10 cm   LV E/e' lateral: 4.4 LV SV:         62 LV SV Index:   32 LVOT Area:     3.46 cm  RIGHT VENTRICLE RV S prime:     11.90  cm/s TAPSE (M-mode): 1.5 cm LEFT ATRIUM             Index        RIGHT ATRIUM           Index LA diam:        2.90 cm 1.49 cm/m   RA Area:     12.70 cm LA Vol (A2C):   41.3 ml 21.21 ml/m  RA Volume:   30.60 ml  15.72 ml/m LA Vol (A4C):   41.5 ml 21.32 ml/m LA Biplane Vol: 42.9 ml 22.03 ml/m  AORTIC VALVE LVOT Vmax:   85.00 cm/s LVOT Vmean:  55.300 cm/s LVOT VTI:    0.180 m  AORTA Ao Root diam: 2.90 cm Ao Asc diam:  2.60 cm MITRAL VALVE MV Area (PHT): 2.13 cm    SHUNTS MV Decel Time: 356 msec    Systemic VTI:  0.18 m MV E velocity: 55.50 cm/s  Systemic Diam: 2.10 cm Morene Brownie Electronically signed by Morene Brownie Signature Date/Time: 04/13/2024/8:46:24 PM    Final    DG Abd 1 View Result Date: 04/13/2024 CLINICAL DATA:  Small bowel obstruction. EXAM: DG ABDOMEN 1V COMPARISON:  Radiograph yesterday FINDINGS: There is enteric contrast within the transverse, descending, and sigmoid colon. A few prominent air-filled loops of small bowel persist in the central abdomen. Enteric tube tip and side port remain below the diaphragm in the stomach. IMPRESSION: Enteric contrast within the transverse, descending, and sigmoid colon. A few prominent air-filled loops of small bowel persist in the central abdomen. Electronically Signed   By: Andrea Gasman M.D.   On: 04/13/2024 13:47   VAS US  LOWER  EXTREMITY VENOUS (DVT) Result Date: 04/13/2024  Lower Venous DVT Study Patient Name:  PARKER WHERLEY Schwartz  Date of Exam:   04/12/2024 Medical Rec #: [id number]          Accession #:    [id number] Date of Birth: Sep 23, 1965         Patient Gender: M Patient Age:   32 years Exam Location:  [facility] Procedure:      VAS US  LOWER EXTREMITY VENOUS (DVT) Referring Phys: [name] --------------------------------------------------------------------------------  Indications: Swelling, and Edema.  Comparison Study: No prior exam. Performing Technologist: Edilia Elden Appl  Examination Guidelines: A complete  evaluation includes B-mode imaging, spectral Doppler, color Doppler, and power Doppler as needed of all accessible portions of each vessel. Bilateral testing is considered an integral part of a complete examination. Limited examinations for reoccurring indications may be performed as noted. The reflux portion of the exam is performed with the patient in reverse Trendelenburg.  +---------+---------------+---------+-----------+----------+--------------+ RIGHT    CompressibilityPhasicitySpontaneityPropertiesThrombus Aging +---------+---------------+---------+-----------+----------+--------------+ CFV      Full           Yes      Yes                                 +---------+---------------+---------+-----------+----------+--------------+ SFJ      Full           Yes      Yes                                 +---------+---------------+---------+-----------+----------+--------------+ FV Prox  Full                                                        +---------+---------------+---------+-----------+----------+--------------+ FV Mid   Full                                                        +---------+---------------+---------+-----------+----------+--------------+ FV DistalFull                                                        +---------+---------------+---------+-----------+----------+--------------+ PFV      Full                                                        +---------+---------------+---------+-----------+----------+--------------+ POP      Full           Yes      Yes                                 +---------+---------------+---------+-----------+----------+--------------+ PTV      Full                                                        +---------+---------------+---------+-----------+----------+--------------+  PERO     Full                                                         +---------+---------------+---------+-----------+----------+--------------+   +---------+---------------+---------+-----------+----------+--------------+ LEFT     CompressibilityPhasicitySpontaneityPropertiesThrombus Aging +---------+---------------+---------+-----------+----------+--------------+ CFV      Full           Yes      Yes                                 +---------+---------------+---------+-----------+----------+--------------+ SFJ      Full           Yes      Yes                                 +---------+---------------+---------+-----------+----------+--------------+ FV Prox  Full                                                        +---------+---------------+---------+-----------+----------+--------------+ FV Mid   Full                                                        +---------+---------------+---------+-----------+----------+--------------+ FV DistalFull                                                        +---------+---------------+---------+-----------+----------+--------------+ PFV      Full                                                        +---------+---------------+---------+-----------+----------+--------------+ POP      Full           Yes      Yes                                 +---------+---------------+---------+-----------+----------+--------------+ PTV      Full                                                        +---------+---------------+---------+-----------+----------+--------------+ PERO     Full                                                        +---------+---------------+---------+-----------+----------+--------------+  Summary: BILATERAL: - No evidence of deep vein thrombosis seen in the lower extremities, bilaterally. -No evidence of popliteal cyst, bilaterally.   *See table(s) above for measurements and observations. Electronically signed by Fonda Rim on 04/13/2024 at 9:43:39 AM.     Final    DG Abd Portable 1V-Small Bowel Obstruction Protocol-initial, 8 hr delay Result Date: 04/12/2024 EXAM: 1 VIEW XRAY OF THE ABDOMEN 04/12/2024 08:02:00 PM COMPARISON: 04/11/2024 CLINICAL HISTORY: FINDINGS: LINES, TUBES AND DEVICES: Gastric tube tip noted overlying the gastric lumen. BOWEL: Nonobstructive bowel gas pattern. Small volume of retained oral contrast in the distal small bowel and proximal colon, similar volume to yesterday, but decreased. Normal abdominal gas pattern. SOFT TISSUES: No abnormal calcifications. BONES: No acute fracture. Spinal fusion hardware noted. IMPRESSION: 1. Small volume of retained oral contrast in the distal small bowel and proximal colon, decreased. 2. Gastric tube tip overlies the gastric lumen. Electronically signed by: Dorethia Molt MD 04/12/2024 08:41 PM EST RP Workstation: [id number]        Scheduled Meds:  enoxaparin  (LOVENOX ) injection  40 mg Subcutaneous Q24H   pantoprazole  (PROTONIX ) IV  40 mg Intravenous Q24H   Continuous Infusions:     LOS: 3 days    Time spent: 45 minutes spent on chart review, discussion with nursing staff, consultants, updating family and interview/physical exam; more than 50% of that time was spent in counseling and/or coordination of care.    Harlene RAYMOND Bowl, DO [facility] Available via Epic secure chat 7am-7pm After these hours, please refer to coverage provider listed on [email or web address] 04/14/2024, 12:39 PM   "

## 2024-04-15 ENCOUNTER — Other Ambulatory Visit (HOSPITAL_COMMUNITY): Payer: Self-pay

## 2024-04-15 DIAGNOSIS — R112 Nausea with vomiting, unspecified: Secondary | ICD-10-CM

## 2024-04-15 DIAGNOSIS — R1084 Generalized abdominal pain: Secondary | ICD-10-CM

## 2024-04-15 LAB — BASIC METABOLIC PANEL WITH GFR
Anion gap: 9 (ref 5–15)
BUN: <5 mg/dL — ABNORMAL LOW (ref 6–20)
CO2: 26 mmol/L (ref 22–32)
Calcium: 8.8 mg/dL — ABNORMAL LOW (ref 8.9–10.3)
Chloride: 104 mmol/L (ref 98–111)
Creatinine, Ser: 0.63 mg/dL (ref 0.61–1.24)
GFR, Estimated: >60 mL/min
Glucose, Bld: 83 mg/dL (ref 70–99)
Potassium: 3.7 mmol/L (ref 3.5–5.1)
Sodium: 140 mmol/L (ref 135–145)

## 2024-04-15 LAB — CBC
HCT: 35.9 % — ABNORMAL LOW (ref 39.0–52.0)
Hemoglobin: 12.5 g/dL — ABNORMAL LOW (ref 13.0–17.0)
MCH: 31.6 pg (ref 26.0–34.0)
MCHC: 34.8 g/dL (ref 30.0–36.0)
MCV: 90.9 fL (ref 80.0–100.0)
Platelets: 205 K/uL (ref 150–400)
RBC: 3.95 MIL/uL — ABNORMAL LOW (ref 4.22–5.81)
RDW: 12.5 % (ref 11.5–15.5)
WBC: 7.3 K/uL (ref 4.0–10.5)
nRBC: 0 % (ref 0.0–0.2)

## 2024-04-15 MED ORDER — PANTOPRAZOLE SODIUM 40 MG PO TBEC
40.0000 mg | DELAYED_RELEASE_TABLET | Freq: Every day | ORAL | 1 refills | Status: AC
  Filled 2024-04-15 – 2024-05-21 (×2): qty 30, 30d supply, fill #0

## 2024-04-15 MED ORDER — ONDANSETRON HCL 4 MG PO TABS
4.0000 mg | ORAL_TABLET | Freq: Four times a day (QID) | ORAL | 0 refills | Status: AC | PRN
  Filled 2024-04-15: qty 20, 5d supply, fill #0

## 2024-04-15 NOTE — Discharge Summary (Signed)
 " Physician Discharge Summary   Patient: [name] MRN: [id number] DOB: 02/05/1966  Admit date:     04/11/2024  Discharge date: 04/15/2024  Discharge Physician: Nydia Distance, MD    PCP: [facility]   Recommendations at discharge:   Patient will need to have good bowel management  Discharge Diagnoses:    SBO (small bowel obstruction) (HCC)   Chronic pain   Leg swelling   Hospital Course:   58 y.o. 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  NG tube was placed with improvement of symptoms.  NG tube has since been removed advance diet as tolerated.   Patient is tolerating diet and cleared by surgery to discharge home.   Assessment and Plan:    SBO  - Surgery was consulted, patient was placed on n.p.o. status, IV fluids, pain control, NGT with intermittent wall suction. -Significant improvement, now tolerating solid diet, cleared by surgery to discharge home. - Counseled on judicious use of Imodium  at home.    Leg swelling - Patient developed b/l LE pitting edema, L>R, over the past 1 month  - Creatinine and albumin are normal and there is no protein on UA; hyperpigmentation in gaiter distribution is noted and suggestive of chronic venous stasis  -  LE venous Doppler and echocardiogram-unrevealing.  2D echo showed EF of 55 to 60%.  - Swelling resolved per patient   Chronic pain  - Treating acute pain for now     Hypokalemia - Replaced.  Potassium 3.7 at discharge.   Hypoglycemia - Due to no p.o. intake -Resolved with eating       Pain control - [place]  Controlled Substance Reporting System database was reviewed. and patient was instructed, not to drive, operate heavy machinery, perform  activities at heights, swimming or participation in water  activities or provide baby-sitting services while on Pain, Sleep and Anxiety Medications; until their outpatient Physician has advised to do so again. Also recommended to not to take more than prescribed Pain, Sleep and Anxiety Medications.  Consultants: General Surgery Procedures performed: None Disposition: Home Diet recommendation: Regular diet  DISCHARGE MEDICATION: Allergies as of 04/15/2024       Reactions   Celecoxib Other (See Comments)   IRRITABILITY Other reaction(s): IRRITABILITY   Clonazepam Itching   Cyclobenzaprine Other (See Comments)   IRRITABILITY Other reaction(s): IRRITABILITY   Dexamethasone Other (See Comments)   IRRITABILITY Other reaction(s): IRRITABILITY   Diazepam Other (See Comments)   IRRITABILITY Other reaction(s): IRRITABILITY   Etodolac Other (See Comments)   IRRITABILITY Other reaction(s): IRRITABILITY   Gabapentin Other (See Comments)   IRRITABILITY Other reaction(s): IRRITABILITY   Latex Itching, Rash   Metaxalone Other (See Comments)   IRRITABILITY (Skelaxin) Other reaction(s): IRRITABILITY   Methadone Other (See Comments)   IRRITABILITY  Other reaction(s): IRRITABILITY   Methocarbamol Other (See Comments)   IRRITABILITY Other reaction(s): IRRITABILITY   Other Other (See Comments), Rash   PLASTIC TAPE TAKES OFF 1-2 LAYERS OF [name] SKIN   Pregabalin Other (See Comments)   Aggression and irritability Other reaction(s): IRRITABILITY   Propoxyphene Other (See Comments)   IRRITABILITY Other reaction(s): IRRITABILITY   Tape Other (See Comments), Rash   PLASTIC TAPE TAKES OFF 1-2 LAYERS OF [name] SKIN   Tramadol Other (See Comments)  IRRITABILITY Other reaction(s): IRRITABILITY   Wound Dressing Adhesive Other (See Comments), Rash   PLASTIC TAPE TAKES OFF 1-2 LAYERS OF [name] SKIN, also   Aspirin Other (See Comments)   Cannot take this again- affects bowels- Tylenol   ONLY   Oxycodone  Hcl Other (See Comments)   Made the patient feel uncomfortable   Motrin [ibuprofen] Other (See Comments)   Cannot take this again- affects bowels- Tylenol  ONLY   Oxycodone  Other (See Comments)   Made the patient feel uncomfortable        Medication List     PAUSE taking these medications    Potassium 99 MG Tabs Wait to take this until your doctor or other care provider tells you to start again. Take 1 tablet by mouth daily.       TAKE these medications    aspirin-acetaminophen -caffeine 250-250-65 MG tablet Commonly known as: EXCEDRIN MIGRAINE Take 2 tablets by mouth every 4 (four) hours.   cyanocobalamin  1000 MCG/ML injection Commonly known as: VITAMIN B12 Inject 1,000 mcg into the muscle every 30 (thirty) days.   diphenhydrAMINE  25 MG tablet Commonly known as: BENADRYL  Take 50 mg by mouth every 6 (six) hours as needed for sleep.   MAGNESIUM  PO Take 1 tablet by mouth daily.   melatonin 5 MG Tabs Take 1 tablet (5 mg total) by mouth at bedtime as needed (for sleep).   morphine  15 MG tablet Commonly known as: MSIR Take 15 mg by mouth every 8 (eight) hours.   multivitamin with minerals tablet Take 1 tablet by mouth daily.   ondansetron  4 MG tablet Commonly known as: ZOFRAN  Take 1 tablet (4 mg total) by mouth every 6 (six) hours as needed for nausea or vomiting.   pantoprazole  40 MG tablet Commonly known as: Protonix  Take 1 tablet (40 mg total) by mouth daily.   vitamin C 1000 MG tablet Take 1,000 mg by mouth daily.        Follow-up Information     [facility]. Schedule an appointment as soon as possible for a visit in 2 week(s).   Specialty: Family Medicine Why: for hospital follow-up Contact information: [address] [place] [address] [phone]         [facility]. Schedule an appointment as soon as possible for a visit in 2 week(s).   Specialty: Gastroenterology Why:  for hospital follow-up Contact information: [address] [place] [address] [place] [address] [phone]                Discharge Exam: Fredricka Weights   04/13/24 1300  Weight: 75.2 kg   S: Eating regular diet, cleared by general surgery to discharge home.  BP 106/61 (BP Location: Left Arm)   Pulse 61   Temp 98.4 F (36.9 C) (Oral)   Resp 17   Ht 5' 11 (1.803 m)   Wt 75.2 kg   SpO2 100%   BMI 23.12 kg/m   Physical Exam General: Alert and oriented x 3, NAD Cardiovascular: S1 S2 clear, RRR.  Respiratory: CTAB, no wheezing, rales Gastrointestinal: Soft, nontender, nondistended, NBS Ext: no pedal edema bilaterally Neuro: no new deficits Psych: Normal affect     Condition at discharge: fair  The results of significant diagnostics from this hospitalization (including imaging, microbiology, ancillary and laboratory) are listed below for reference.   Imaging Studies: ECHOCARDIOGRAM COMPLETE Result Date: 04/13/2024    ECHOCARDIOGRAM REPORT   Patient Name:   IANN RODIER Schwartz Date of Exam:  04/13/2024 Medical Rec #:  [id number]         Height:       71.0 in Accession #:    [id number]        Weight:       165.8 lb Date of Birth:  06-22-1965        BSA:          1.947 m Patient Age:    58 years          BP:           116/80 mmHg Patient Gender: M                 HR:           55 bpm. Exam Location:  Inpatient Procedure: 2D Echo, Cardiac Doppler, Color Doppler and Intracardiac            Opacification Agent (Both Spectral and Color Flow Doppler were            utilized during procedure). Indications:    CHF - Acute Diastolic  History:        Patient has no prior history of Echocardiogram examinations.  Sonographer:    Sherlean Dubin Sonographer#2:  Carmelita Hartshorn RDCS, FE, PE Referring Phys: [id number] TIMOTHY S OPYD  Sonographer Comments: Suboptimal apical window and suboptimal parasternal window. Image acquisition challenging due to patient body habitus and Image  acquisition challenging due to respiratory motion. Aortic Valve assessd from subcostal window. IMPRESSIONS  1. Left ventricular ejection fraction, by estimation, is 55 to 60%. The left ventricle has normal function. Left ventricular endocardial border not optimally defined to evaluate regional wall motion. Left ventricular diastolic function could not be evaluated.  2. Right ventricular systolic function is normal. The right ventricular size is normal.  3. The mitral valve is grossly normal. Trivial mitral valve regurgitation.  4. The aortic valve is grossly normal. Aortic valve regurgitation is not visualized. FINDINGS  Left Ventricle: LV grossly normal, extremely limited study even with the addition of definity . Left ventricular ejection fraction, by estimation, is 55 to 60%. The left ventricle has normal function. Left ventricular endocardial border not optimally defined to evaluate regional wall motion. Definity  contrast agent was given IV to delineate the left ventricular endocardial borders. The left ventricular internal cavity size was normal in size. There is no left ventricular hypertrophy. Left ventricular  diastolic function could not be evaluated due to nondiagnostic images. Left ventricular diastolic function could not be evaluated. Right Ventricle: The right ventricular size is normal. Right vetricular wall thickness was not well visualized. Right ventricular systolic function is normal. Left Atrium: Left atrial size was normal in size. Right Atrium: Right atrial size was normal in size. Pericardium: There is no evidence of pericardial effusion. Mitral Valve: The mitral valve is grossly normal. Mild mitral annular calcification. Trivial mitral valve regurgitation. Tricuspid Valve: The tricuspid valve is grossly normal. Tricuspid valve regurgitation is not demonstrated. Aortic Valve: The aortic valve is grossly normal. Aortic valve regurgitation is not visualized. Pulmonic Valve: The pulmonic valve was  not well visualized. Aorta: The aortic root is normal in size and structure. IAS/Shunts: The interatrial septum was not well visualized.  LEFT VENTRICLE PLAX 2D LVIDd:         4.20 cm   Diastology LVIDs:         3.10 cm   LV e' medial:    12.40 cm/s LV PW:         0.70 cm  LV E/e' medial:  4.5 LV IVS:        0.80 cm   LV e' lateral:   12.50 cm/s LVOT diam:     2.10 cm   LV E/e' lateral: 4.4 LV SV:         62 LV SV Index:   32 LVOT Area:     3.46 cm  RIGHT VENTRICLE RV S prime:     11.90 cm/s TAPSE (M-mode): 1.5 cm LEFT ATRIUM             Index        RIGHT ATRIUM           Index LA diam:        2.90 cm 1.49 cm/m   RA Area:     12.70 cm LA Vol (A2C):   41.3 ml 21.21 ml/m  RA Volume:   30.60 ml  15.72 ml/m LA Vol (A4C):   41.5 ml 21.32 ml/m LA Biplane Vol: 42.9 ml 22.03 ml/m  AORTIC VALVE LVOT Vmax:   85.00 cm/s LVOT Vmean:  55.300 cm/s LVOT VTI:    0.180 m  AORTA Ao Root diam: 2.90 cm Ao Asc diam:  2.60 cm MITRAL VALVE MV Area (PHT): 2.13 cm    SHUNTS MV Decel Time: 356 msec    Systemic VTI:  0.18 m MV E velocity: 55.50 cm/s  Systemic Diam: 2.10 cm Morene Brownie Electronically signed by Morene Brownie Signature Date/Time: 04/13/2024/8:46:24 PM    Final    DG Abd 1 View Result Date: 04/13/2024 CLINICAL DATA:  Small bowel obstruction. EXAM: DG ABDOMEN 1V COMPARISON:  Radiograph yesterday FINDINGS: There is enteric contrast within the transverse, descending, and sigmoid colon. A few prominent air-filled loops of small bowel persist in the central abdomen. Enteric tube tip and side port remain below the diaphragm in the stomach. IMPRESSION: Enteric contrast within the transverse, descending, and sigmoid colon. A few prominent air-filled loops of small bowel persist in the central abdomen. Electronically Signed   By: Andrea Gasman M.D.   On: 04/13/2024 13:47   VAS US  LOWER EXTREMITY VENOUS (DVT) Result Date: 04/13/2024  Lower Venous DVT Study Patient Name:  SERAJ DUNNAM Schwartz  Date of Exam:    04/12/2024 Medical Rec #: [id number]          Accession #:    [id number] Date of Birth: July 25, 1965         Patient Gender: M Patient Age:   86 years Exam Location:  [facility] Procedure:      VAS US  LOWER EXTREMITY VENOUS (DVT) Referring Phys: TIMOTHY OPYD --------------------------------------------------------------------------------  Indications: Swelling, and Edema.  Comparison Study: No prior exam. Performing Technologist: Edilia Elden Appl  Examination Guidelines: A complete evaluation includes B-mode imaging, spectral Doppler, color Doppler, and power Doppler as needed of all accessible portions of each vessel. Bilateral testing is considered an integral part of a complete examination. Limited examinations for reoccurring indications may be performed as noted. The reflux portion of the exam is performed with the patient in reverse Trendelenburg.  +---------+---------------+---------+-----------+----------+--------------+ RIGHT    CompressibilityPhasicitySpontaneityPropertiesThrombus Aging +---------+---------------+---------+-----------+----------+--------------+ CFV      Full           Yes      Yes                                 +---------+---------------+---------+-----------+----------+--------------+ SFJ      Full  Yes      Yes                                 +---------+---------------+---------+-----------+----------+--------------+ FV Prox  Full                                                        +---------+---------------+---------+-----------+----------+--------------+ FV Mid   Full                                                        +---------+---------------+---------+-----------+----------+--------------+ FV DistalFull                                                        +---------+---------------+---------+-----------+----------+--------------+ PFV      Full                                                         +---------+---------------+---------+-----------+----------+--------------+ POP      Full           Yes      Yes                                 +---------+---------------+---------+-----------+----------+--------------+ PTV      Full                                                        +---------+---------------+---------+-----------+----------+--------------+ PERO     Full                                                        +---------+---------------+---------+-----------+----------+--------------+   +---------+---------------+---------+-----------+----------+--------------+ LEFT     CompressibilityPhasicitySpontaneityPropertiesThrombus Aging +---------+---------------+---------+-----------+----------+--------------+ CFV      Full           Yes      Yes                                 +---------+---------------+---------+-----------+----------+--------------+ SFJ      Full           Yes      Yes                                 +---------+---------------+---------+-----------+----------+--------------+ FV Prox  Full                                                        +---------+---------------+---------+-----------+----------+--------------+  FV Mid   Full                                                        +---------+---------------+---------+-----------+----------+--------------+ FV DistalFull                                                        +---------+---------------+---------+-----------+----------+--------------+ PFV      Full                                                        +---------+---------------+---------+-----------+----------+--------------+ POP      Full           Yes      Yes                                 +---------+---------------+---------+-----------+----------+--------------+ PTV      Full                                                         +---------+---------------+---------+-----------+----------+--------------+ PERO     Full                                                        +---------+---------------+---------+-----------+----------+--------------+     Summary: BILATERAL: - No evidence of deep vein thrombosis seen in the lower extremities, bilaterally. -No evidence of popliteal cyst, bilaterally.   *See table(s) above for measurements and observations. Electronically signed by Fonda Rim on 04/13/2024 at 9:43:39 AM.    Final    DG Abd Portable 1V-Small Bowel Obstruction Protocol-initial, 8 hr delay Result Date: 04/12/2024 EXAM: 1 VIEW XRAY OF THE ABDOMEN 04/12/2024 08:02:00 PM COMPARISON: 04/11/2024 CLINICAL HISTORY: FINDINGS: LINES, TUBES AND DEVICES: Gastric tube tip noted overlying the gastric lumen. BOWEL: Nonobstructive bowel gas pattern. Small volume of retained oral contrast in the distal small bowel and proximal colon, similar volume to yesterday, but decreased. Normal abdominal gas pattern. SOFT TISSUES: No abnormal calcifications. BONES: No acute fracture. Spinal fusion hardware noted. IMPRESSION: 1. Small volume of retained oral contrast in the distal small bowel and proximal colon, decreased. 2. Gastric tube tip overlies the gastric lumen. Electronically signed by: Dorethia Molt MD 04/12/2024 08:41 PM EST RP Workstation: [id number]   DG Abd Portable 1 View Result Date: 04/11/2024 EXAM: 1 VIEW XRAY OF THE ABDOMEN 04/11/2024 09:53:29 PM COMPARISON: CT abdomen and pelvis with contrast today at 6:17 pm. CLINICAL HISTORY: post ng placement FINDINGS: Only the upper abdomen was included in the film. LINES, TUBES AND DEVICES: Nasogastric tube (NGT) has been inserted, extending to the left within the stomach with  the tip in the fundal/proximal body of stomach area, adequately inserted. BOWEL: Small bowel dilatation is not as well seen as on CT but is probably unchanged. SOFT TISSUES: There is contrast in the normal caliber  renal collecting systems and proximal ureters. BONES: Left lateral side plate fusion hardware is again noted in the lower thoracic spine. No acute fracture. IMPRESSION: 1. NGT in appropriate position. 2. Small bowel dilatation not as well seen as on CT although probably unchanged. Electronically signed by: Francis Quam MD 04/11/2024 10:48 PM EST RP Workstation: [id number]   CT ABDOMEN PELVIS W CONTRAST Result Date: 04/11/2024 EXAM: CT ABDOMEN AND PELVIS WITH CONTRAST 04/11/2024 06:32:00 PM TECHNIQUE: CT of the abdomen and pelvis was performed with the administration of 75 mL of iohexol  (OMNIPAQUE ) 350 MG/ML injection. Multiplanar reformatted images are provided for review. Automated exposure control, iterative reconstruction, and/or weight-based adjustment of the mA/kV was utilized to reduce the radiation dose to as low as reasonably achievable. COMPARISON: CT abdomen and pelvis 12/30/2022. CLINICAL HISTORY: Abdominal pain, acute, nonlocalized. FINDINGS: LOWER CHEST: There is scarring in the left lung base. LIVER: Hypodense lesion in the left lobe of the liver is unchanged measuring 16 mm. This was previously characterized as hemangioma. Subcentimeter hypodensity in the right lobe of the liver is also unchanged, likely hemangioma or cyst. No new liver lesions are identified. GALLBLADDER AND BILE DUCTS: The gallbladder is within normal limits. No biliary ductal dilatation. SPLEEN: No acute abnormality. PANCREAS: No acute abnormality. ADRENAL GLANDS: No acute abnormality. KIDNEYS, URETERS AND BLADDER: Scarring in the superior pole of the right kidney. Punctate calculus in the inferior pole of the right kidney. Left kidney appears normal. The bladder is distended. No hydronephrosis. No perinephric or periureteral stranding. GI AND BOWEL: Stomach is moderately distended. Right hemicolectomy changes are again seen. Ileocolic anastomosis in the right abdomen is unchanged. Short segment of circumferential wall  thickening of the ileum just proximal to the anastomosis worrisome for enteritis. This is a transition point for small bowel obstruction. Small bowel loops proximal to this level are dilated and fluid filled. The colon is completely decompressed which limits its evaluation. Scattered colonic diverticula. PERITONEUM AND RETROPERITONEUM: Trace free fluid in the pelvis. No free air. VASCULATURE: Aorta is normal in caliber. Atherosclerotic calcifications of the aorta and iliac arteries. LYMPH NODES: Prominent bilateral inguinal lymph nodes. REPRODUCTIVE ORGANS: No acute abnormality. BONES AND SOFT TISSUES: Surgical hardware is seen at L3-L4, L4-L5, and in the lower thoracic region. No acute osseous abnormality. No focal soft tissue abnormality. IMPRESSION: 1. Small bowel obstruction with transition point at level of focal ileitis just proximal to ileocolic anastomosis. 2. Moderately distended stomach. 3. Trace free fluid in the pelvis. 4. Prominent bilateral inguinal lymph nodes, nonspecific. Electronically signed by: Greig Pique MD 04/11/2024 08:05 PM EST RP Workstation: [id number]    Microbiology: Results for orders placed or performed during the hospital encounter of 03/31/21  Resp Panel by RT-PCR (Flu A&B, Covid) Nasopharyngeal Swab     Status: None   Collection Time: 03/31/21 10:30 AM   Specimen: Nasopharyngeal Swab; Nasopharyngeal(NP) swabs in vial transport medium  Result Value Ref Range Status   SARS Coronavirus 2 by RT PCR NEGATIVE NEGATIVE Final    Comment: (NOTE) SARS-CoV-2 target nucleic acids are NOT DETECTED.  The SARS-CoV-2 RNA is generally detectable in upper respiratory specimens during the acute phase of infection. The lowest concentration of SARS-CoV-2 viral copies this assay can detect is 138 copies/mL. A negative result does not preclude SARS-Cov-2 infection  and should not be used as the sole basis for treatment or other patient management decisions. A negative result may occur  with  improper specimen collection/handling, submission of specimen other than nasopharyngeal swab, presence of viral mutation(s) within the areas targeted by this assay, and inadequate number of viral copies(<138 copies/mL). A negative result must be combined with clinical observations, patient history, and epidemiological information. The expected result is Negative.  Fact Sheet for Patients:  bloggercourse.com  Fact Sheet for Healthcare Providers:  seriousbroker.it  This test is no t yet approved or cleared by the [place]  FDA and  has been authorized for detection and/or diagnosis of SARS-CoV-2 by FDA under an Emergency Use Authorization (EUA). This EUA will remain  in effect (meaning this test can be used) for the duration of the COVID-19 declaration under Section 564(b)(1) of the Act, 21 U.S.C.section 360bbb-3(b)(1), unless the authorization is terminated  or revoked sooner.       Influenza A by PCR NEGATIVE NEGATIVE Final   Influenza B by PCR NEGATIVE NEGATIVE Final    Comment: (NOTE) The Xpert Xpress SARS-CoV-2/FLU/RSV plus assay is intended as an aid in the diagnosis of influenza from Nasopharyngeal swab specimens and should not be used as a sole basis for treatment. Nasal washings and aspirates are unacceptable for Xpert Xpress SARS-CoV-2/FLU/RSV testing.  Fact Sheet for Patients: bloggercourse.com  Fact Sheet for Healthcare Providers: seriousbroker.it  This test is not yet approved or cleared by the [place]  FDA and has been authorized for detection and/or diagnosis of SARS-CoV-2 by FDA under an Emergency Use Authorization (EUA). This EUA will remain in effect (meaning this test can be used) for the duration of the COVID-19 declaration under Section 564(b)(1) of the Act, 21 U.S.C. section 360bbb-3(b)(1), unless the authorization is terminated  or revoked.  Performed at [facility], [address], [place], [place] [address]   Culture, blood (routine x 2)     Status: None   Collection Time: 03/31/21 11:52 AM   Specimen: BLOOD  Result Value Ref Range Status   Specimen Description BLOOD SITE NOT SPECIFIED  Final   Special Requests   Final    BOTTLES DRAWN AEROBIC AND ANAEROBIC Blood Culture results may not be optimal due to an excessive volume of blood received in culture bottles   Culture   Final    NO GROWTH 5 DAYS Performed at [facility], [address] [place], [place] [address]    Report Status 04/05/2021 FINAL  Final  Culture, blood (routine x 2)     Status: None   Collection Time: 03/31/21 11:55 AM   Specimen: BLOOD  Result Value Ref Range Status   Specimen Description BLOOD SITE NOT SPECIFIED  Final   Special Requests   Final    BOTTLES DRAWN AEROBIC AND ANAEROBIC Blood Culture adequate volume   Culture   Final    NO GROWTH 5 DAYS Performed at [facility], [address], [place], [place] [address]    Report Status 04/05/2021 FINAL  Final    Labs: CBC: Recent Labs  Lab 04/11/24 1530 04/12/24 0313 04/13/24 0713 04/14/24 0527 04/15/24 0551  WBC 17.3* 9.5 7.6 12.5* 7.3  HGB 13.5 11.6* 11.3* 13.3 12.5*  HCT 40.0 34.3* 32.9* 39.0 35.9*  MCV 93.5 94.5 91.9 94.2 90.9  PLT 228 194 186 247 205   Basic Metabolic Panel: Recent Labs  Lab 04/11/24 1530 04/12/24 0313 04/13/24 0713 04/14/24 0815 04/15/24 0551  NA 139  140 136 134* 140  K 3.6 3.7 3.3* 4.0 3.7  CL 102 108 105 101 104  CO2 27 23 21* 19* 26  GLUCOSE 107* 86 71 88 83  BUN 9 10 15 13  <5*  CREATININE 0.78 0.78 0.64 0.74 0.63  CALCIUM  9.2 8.1* 8.3* 8.4* 8.8*  MG  --  2.0  --   --   --    Liver Function Tests: Recent Labs  Lab 04/11/24 1530 04/12/24 0313  AST 27 21  ALT 13 9  ALKPHOS 98 76  BILITOT 0.3 0.3  PROT 6.9 5.5*  ALBUMIN 4.0 3.2*   CBG: Recent Labs  Lab 04/14/24 0741 04/14/24 0901  GLUCAP 40*  171*    Discharge time spent: greater than 30 minutes.  Signed: Nydia Distance, MD [facility] 04/15/2024 "

## 2024-04-15 NOTE — Progress Notes (Signed)
 Discharge Nurse Summary: DC order noted per MD. DC RN at bedside with patient. Patient agreeable with discharge plan, agreeable to dc home. Wife to pickup the patient. Passed BM this am after breakfast. AVS printed/reviewed. PIV removed. Telemonitor not present on assessment. No DME needs. TOC meds delivered to patient. CP/Edu resolved. Patient dressed and ready to go. All belongings accounted for. Volunteer wheeled patient downstairs for transport via private vehicle.   Rosario Lund, RN

## 2024-04-15 NOTE — Plan of Care (Signed)

## 2024-05-20 ENCOUNTER — Other Ambulatory Visit (HOSPITAL_COMMUNITY): Payer: Self-pay

## 2024-05-21 ENCOUNTER — Other Ambulatory Visit (HOSPITAL_COMMUNITY): Payer: Self-pay
